# Patient Record
Sex: Male | Born: 1950
Health system: Southern US, Community
[De-identification: ages and names within clinical notes are randomized; demographics above are authoritative.]

## PROBLEM LIST (undated history)

## (undated) DIAGNOSIS — I10 Essential (primary) hypertension: Secondary | ICD-10-CM

## (undated) DIAGNOSIS — Z87891 Personal history of nicotine dependence: Secondary | ICD-10-CM

## (undated) DIAGNOSIS — I2510 Atherosclerotic heart disease of native coronary artery without angina pectoris: Secondary | ICD-10-CM

## (undated) DIAGNOSIS — E876 Hypokalemia: Secondary | ICD-10-CM

## (undated) DIAGNOSIS — E785 Hyperlipidemia, unspecified: Secondary | ICD-10-CM

## (undated) DIAGNOSIS — I5189 Other ill-defined heart diseases: Secondary | ICD-10-CM

## (undated) DIAGNOSIS — E119 Type 2 diabetes mellitus without complications: Secondary | ICD-10-CM

## (undated) DIAGNOSIS — I480 Paroxysmal atrial fibrillation: Secondary | ICD-10-CM

## (undated) DIAGNOSIS — I739 Peripheral vascular disease, unspecified: Secondary | ICD-10-CM

## (undated) HISTORY — PX: BACK SURGERY: SHX140

## (undated) HISTORY — DX: Personal history of nicotine dependence: Z87.891

## (undated) HISTORY — DX: Other ill-defined heart diseases: I51.89

## (undated) HISTORY — PX: KNEE SURGERY: SHX244

## (undated) HISTORY — DX: Type 2 diabetes mellitus without complications: E11.9

## (undated) HISTORY — DX: Atherosclerotic heart disease of native coronary artery without angina pectoris: I25.10

## (undated) HISTORY — DX: Hyperlipidemia, unspecified: E78.5

## (undated) HISTORY — DX: Hypokalemia: E87.6

## (undated) HISTORY — DX: Paroxysmal atrial fibrillation: I48.0

## (undated) HISTORY — PX: CARDIAC CATHETERIZATION: SHX172

## (undated) HISTORY — PX: COLONOSCOPY: SHX174

---

## 2014-04-15 ENCOUNTER — Ambulatory Visit: Payer: Self-pay | Admitting: Gastroenterology

## 2014-04-15 LAB — HM COLONOSCOPY

## 2015-05-17 LAB — PSA

## 2015-05-28 ENCOUNTER — Emergency Department
Admission: EM | Admit: 2015-05-28 | Discharge: 2015-05-28 | Disposition: A | Discharge status: Home / Self Care | Payer: No Typology Code available for payment source | Attending: Emergency Medicine | Admitting: Emergency Medicine

## 2015-05-28 ENCOUNTER — Emergency Department: Payer: No Typology Code available for payment source

## 2015-05-28 ENCOUNTER — Encounter: Payer: Self-pay | Admitting: Emergency Medicine

## 2015-05-28 DIAGNOSIS — J441 Chronic obstructive pulmonary disease with (acute) exacerbation: Secondary | ICD-10-CM | POA: Insufficient documentation

## 2015-05-28 DIAGNOSIS — J40 Bronchitis, not specified as acute or chronic: Secondary | ICD-10-CM

## 2015-05-28 DIAGNOSIS — I10 Essential (primary) hypertension: Secondary | ICD-10-CM | POA: Diagnosis not present

## 2015-05-28 DIAGNOSIS — J42 Unspecified chronic bronchitis: Secondary | ICD-10-CM

## 2015-05-28 DIAGNOSIS — Z720 Tobacco use: Secondary | ICD-10-CM | POA: Diagnosis not present

## 2015-05-28 DIAGNOSIS — Z79899 Other long term (current) drug therapy: Secondary | ICD-10-CM | POA: Diagnosis not present

## 2015-05-28 DIAGNOSIS — R05 Cough: Secondary | ICD-10-CM | POA: Diagnosis present

## 2015-05-28 HISTORY — DX: Essential (primary) hypertension: I10

## 2015-05-28 MED ORDER — DEXAMETHASONE SODIUM PHOSPHATE 10 MG/ML IJ SOLN
10.0000 mg | Freq: Once | INTRAMUSCULAR | Status: AC
Start: 2015-05-28 — End: 2015-05-28
  Administered 2015-05-28: 10 mg via INTRAMUSCULAR

## 2015-05-28 MED ORDER — LORATADINE 10 MG PO TABS: 10.0000 mg | ORAL_TABLET | Freq: Every day | ORAL | Status: DC

## 2015-05-28 MED ORDER — ALBUTEROL SULFATE HFA 108 (90 BASE) MCG/ACT IN AERS: 2.0000 | INHALATION_SPRAY | Freq: Four times a day (QID) | RESPIRATORY_TRACT | Status: DC | PRN

## 2015-05-28 MED ORDER — IPRATROPIUM-ALBUTEROL 0.5-2.5 (3) MG/3ML IN SOLN
3.0000 mL | Freq: Once | RESPIRATORY_TRACT | Status: AC
  Administered 2015-05-28: 3 mL via RESPIRATORY_TRACT

## 2015-05-28 MED ORDER — AZITHROMYCIN 250 MG PO TABS: ORAL_TABLET | ORAL | Status: AC

## 2015-05-28 MED ORDER — AZITHROMYCIN 250 MG PO TABS
500.0000 mg | ORAL_TABLET | Freq: Once | ORAL | Status: AC
  Administered 2015-05-28: 500 mg via ORAL

## 2015-05-28 MED ORDER — AZITHROMYCIN 250 MG PO TABS
ORAL_TABLET | ORAL | Status: AC
  Filled 2015-05-28: qty 2

## 2015-05-28 MED ORDER — IPRATROPIUM-ALBUTEROL 0.5-2.5 (3) MG/3ML IN SOLN
RESPIRATORY_TRACT | Status: AC
  Filled 2015-05-28: qty 3

## 2015-05-28 MED ORDER — DEXAMETHASONE SODIUM PHOSPHATE 10 MG/ML IJ SOLN
INTRAMUSCULAR | Status: AC
  Filled 2015-05-28: qty 1

## 2015-05-28 NOTE — ED Provider Notes (Signed)
CSN: [id number]     Arrival date & time 05/28/15  1518 History   First MD Initiated Contact with Patient 05/28/15 1618     Chief Complaint  Patient presents with  . Cough    cough, congestion and nose running since yesterday     (Consider location/radiation/quality/duration/timing/severity/associated sxs/prior Treatment) HPI Patient with worsening cough started approximately one week ago some drainage states that since yesterday he is congested he feels rattling in his chest wheezing and he is concerned he may be developing a pneumonia has a history of bronchitis he doesn't take anything for it regularly is a smoker approximately one pack per day denies any chest pain radiation swelling in his extremities has had subjective fever and chills nothing really seems to making anything particularly better or worse and no other complaints at this time  Past Medical History  Diagnosis Date  . Hypertension    No past surgical history on file. No family history on file. History  Substance Use Topics  . Smoking status: Current Every Day Smoker -- 1.00 packs/day    Types: Cigarettes  . Smokeless tobacco: Not on file  . Alcohol Use: No    Review of Systems  Constitutional: Negative.   HENT: Negative.   Eyes: Negative.   Cardiovascular: Negative.   Gastrointestinal: Negative.   Musculoskeletal: Negative.   Skin: Negative.   All other systems reviewed and are negative.      Allergies  Review of patient's allergies indicates no known allergies.  Home Medications   Prior to Admission medications   Medication Sig Start Date End Date Taking? Authorizing Provider  albuterol (PROVENTIL HFA;VENTOLIN HFA) 108 (90 BASE) MCG/ACT inhaler Inhale 2 puffs into the lungs every 6 (six) hours as needed for wheezing or shortness of breath. 05/28/15   Ajeenah Heiny Rosalyn Gess, PA-C  azithromycin (ZITHROMAX Z-PAK) 250 MG tablet 1 tablet (250 mg) once daily 05/29/15 06/02/15  Kalaya Infantino William C Dorrance Sellick, PA-C   loratadine (CLARITIN) 10 MG tablet Take 1 tablet (10 mg total) by mouth daily. 05/28/15 05/27/16  Chanise Habeck William C Jordan Caraveo, PA-C   BP 135/87 mmHg  Pulse 64  Temp(Src) 98.3 F (36.8 C) (Oral)  Resp 20  Ht  (1.905 m)  Wt 191 lb (86.637 kg)  BMI 23.87 kg/m2  SpO2 93% Physical Exam State male appearing stated age well-developed well-nourished no acute distress Vitals nursing note reviewed Head ears eyes nose neck and throat examination of this patient was grossly unremarkable pupils equal round reactive to light and accommodation extraocular motions are intact Cardiovascular regular rate and rhythm no murmurs rubs gallops Pulmonary patient has diminished breath sounds in all fields with wheezing in all fields Skin is free of rash or disease Musculoskeletal patient moving all extremities without difficulty patient has no lower extremity edema good distal pulses Neuro exam is nonfocal cranial nerves II through XII grossly intact  ED Course  Procedures (including critical care time) Labs Review Labs Reviewed - No data to display  Imaging Review Dg Chest 2 View  05/28/2015   CLINICAL DATA:  Cough, congestion and shortness of breath since last night.  EXAM: CHEST  2 VIEW  COMPARISON:  None.  FINDINGS: Lungs are hyperexpanded. There is mild interstitial thickening most evident in the lower lungs. Decreased markings in the upper lobes is consistent with emphysema. No lung consolidation or edema. No pleural effusion or pneumothorax.  Cardiac silhouette is normal in size. Normal mediastinal and hilar contours.  Bony thorax is intact.  IMPRESSION:  1. No acute cardiopulmonary disease. 2. COPD.   Electronically Signed   By: Amie Portlandavid  Ormond M.D.   On: 05/28/2015 16:33     EKG Interpretation None      MDM  Decision making on this patient given his age long-term smoking findings of COPD on x-ray believe this patient is having a rhonchi this COPD exacerbation with subjective fevers chills we'll go  ahead and start him on anabiotic's have him follow-up with his doctor to try to find definitive treatment for his COPD and start him on albuterol inhaler as well as azithromycin take over-the-counter cough medicine as needed return here for any acute concerns or worsening symptoms Final diagnoses:  Bronchitis  Chronic bronchitis, unspecified chronic bronchitis type        Karena Kinker Rosalyn GessWilliam C Arman Loy, PA-C 05/28/15 1736  Darien Ramusavid W Kaminski, MD 05/29/15 [phone]

## 2015-05-28 NOTE — Discharge Instructions (Signed)
How to Use an Inhaler Proper inhaler technique is very important. Good technique ensures that the medicine reaches the lungs. Poor technique results in depositing the medicine on the tongue and back of the throat rather than in the airways. If you do not use the inhaler with good technique, the medicine will not help you. STEPS TO FOLLOW IF USING AN INHALER WITHOUT AN EXTENSION TUBE  Remove the cap from the inhaler.  If you are using the inhaler for the first time, you will need to prime it. Shake the inhaler for 5 seconds and release four puffs into the air, away from your face. Ask your health care provider or pharmacist if you have questions about priming your inhaler.  Shake the inhaler for 5 seconds before each breath in (inhalation).  Position the inhaler so that the top of the canister faces up.  Put your index finger on the top of the medicine canister. Your thumb supports the bottom of the inhaler.  Open your mouth.  Either place the inhaler between your teeth and place your lips tightly around the mouthpiece, or hold the inhaler 1-2 inches away from your open mouth. If you are unsure of which technique to use, ask your health care provider.  Breathe out (exhale) normally and as completely as possible.  Press the canister down with your index finger to release the medicine.  At the same time as the canister is pressed, inhale deeply and slowly until your lungs are completely filled. This should take 4-6 seconds. Keep your tongue down.  Hold the medicine in your lungs for 5-10 seconds (10 seconds is best). This helps the medicine get into the small airways of your lungs.  Breathe out slowly, through pursed lips. Whistling is an example of pursed lips.  Wait at least 15-30 seconds between puffs. Continue with the above steps until you have taken the number of puffs your health care provider has ordered. Do not use the inhaler more than your health care provider tells  you.  Replace the cap on the inhaler.  Follow the directions from your health care provider or the inhaler insert for cleaning the inhaler. STEPS TO FOLLOW IF USING AN INHALER WITH AN EXTENSION (SPACER) 1. Remove the cap from the inhaler. 2. If you are using the inhaler for the first time, you will need to prime it. Shake the inhaler for 5 seconds and release four puffs into the air, away from your face. Ask your health care provider or pharmacist if you have questions about priming your inhaler. 3. Shake the inhaler for 5 seconds before each breath in (inhalation). 4. Place the open end of the spacer onto the mouthpiece of the inhaler. 5. Position the inhaler so that the top of the canister faces up and the spacer mouthpiece faces you. 6. Put your index finger on the top of the medicine canister. Your thumb supports the bottom of the inhaler and the spacer. 7. Breathe out (exhale) normally and as completely as possible. 8. Immediately after exhaling, place the spacer between your teeth and into your mouth. Close your lips tightly around the spacer. 9. Press the canister down with your index finger to release the medicine. 10. At the same time as the canister is pressed, inhale deeply and slowly until your lungs are completely filled. This should take 4-6 seconds. Keep your tongue down and out of the way. 11. Hold the medicine in your lungs for 5-10 seconds (10 seconds is best). This helps the  medicine get into the small airways of your lungs. Exhale. 12. Repeat inhaling deeply through the spacer mouthpiece. Again hold that breath for up to 10 seconds (10 seconds is best). Exhale slowly. If it is difficult to take this second deep breath through the spacer, breathe normally several times through the spacer. Remove the spacer from your mouth. 13. Wait at least 15-30 seconds between puffs. Continue with the above steps until you have taken the number of puffs your health care provider has ordered. Do  not use the inhaler more than your health care provider tells you. 14. Remove the spacer from the inhaler, and place the cap on the inhaler. 15. Follow the directions from your health care provider or the inhaler insert for cleaning the inhaler and spacer. If you are using different kinds of inhalers, use your quick relief medicine to open the airways 10-15 minutes before using a steroid if instructed to do so by your health care provider. If you are unsure which inhalers to use and the order of using them, ask your health care provider, nurse, or respiratory therapist. If you are using a steroid inhaler, always rinse your mouth with water after your last puff, then gargle and spit out the water. Do not swallow the water. AVOID:  Inhaling before or after starting the spray of medicine. It takes practice to coordinate your breathing with triggering the spray.  Inhaling through the nose (rather than the mouth) when triggering the spray. HOW TO DETERMINE IF YOUR INHALER IS FULL OR NEARLY EMPTY You cannot know when an inhaler is empty by shaking it. A few inhalers are now being made with dose counters. Ask your health care provider for a prescription that has a dose counter if you feel you need that extra help. If your inhaler does not have a counter, ask your health care provider to help you determine the date you need to refill your inhaler. Write the refill date on a calendar or your inhaler canister. Refill your inhaler 7-10 days before it runs out. Be sure to keep an adequate supply of medicine. This includes making sure it is not expired, and that you have a spare inhaler.  SEEK MEDICAL CARE IF:   Your symptoms are only partially relieved with your inhaler.  You are having trouble using your inhaler.  You have some increase in phlegm. SEEK IMMEDIATE MEDICAL CARE IF:   You feel little or no relief with your inhalers. You are still wheezing and are feeling shortness of breath or tightness in  your chest or both.  You have dizziness, headaches, or a fast heart rate.  You have chills, fever, or night sweats.  You have a noticeable increase in phlegm production, or there is blood in the phlegm. MAKE SURE YOU:   Understand these instructions.  Will watch your condition.  Will get help right away if you are not doing well or get worse. Document Released: 12/14/2000 Document Revised: 10/07/2013 Document Reviewed: 07/16/2013 [facility] Patient Information 2015 [place]. This information is not intended to replace advice given to you by your health care provider. Make sure you discuss any questions you have with your health care provider.  Chronic Obstructive Pulmonary Disease Chronic obstructive pulmonary disease (COPD) is a common lung problem. In COPD, the flow of air from the lungs is limited. The way your lungs work will probably never return to normal, but there are things you can do to improve your lungs and make yourself feel better. HOME  CARE  Take all medicines as told by your doctor.  Avoid medicines or cough syrups that dry up your airway (such as antihistamines) and do not allow you to get rid of thick spit. You do not need to avoid them if told differently by your doctor.  If you smoke, stop. Smoking makes the problem worse.  Avoid being around things that make your breathing worse (like smoke, chemicals, and fumes).  Use oxygen therapy and therapy to help improve your lungs (pulmonary rehabilitation) if told by your doctor. If you need home oxygen therapy, ask your doctor if you should buy a tool to measure your oxygen level (oximeter).  Avoid people who have a sickness you can catch (contagious).  Avoid going outside when it is very hot, cold, or humid.  Eat healthy foods. Eat smaller meals more often. Rest before meals.  Stay active, but remember to also rest.  Make sure to get all the shots (vaccines) your doctor recommends. Ask your doctor if you  need a pneumonia shot.  Learn and use tips on how to relax.  Learn and use tips on how to control your breathing as told by your doctor. Try:  Breathing in (inhaling) through your nose for 1 second. Then, pucker your lips and breath out (exhale) through your lips for 2 seconds.  Putting one hand on your belly (abdomen). Breathe in slowly through your nose for 1 second. Your hand on your belly should move out. Pucker your lips and breathe out slowly through your lips. Your hand on your belly should move in as you breathe out.  Learn and use controlled coughing to clear thick spit from your lungs. The steps are: 16. Lean your head a little forward. 17. Breathe in deeply. 18. Try to hold your breath for 3 seconds. 19. Keep your mouth slightly open while coughing 2 times. 20. Spit any thick spit out into a tissue. 21. Rest and do the steps again 1 or 2 times as needed. GET HELP IF:  You cough up more thick spit than usual.  There is a change in the color or thickness of the spit.  It is harder to breathe than usual.  Your breathing is faster than usual. GET HELP RIGHT AWAY IF:   You have shortness of breath while resting.  You have shortness of breath that stops you from:  Being able to talk.  Doing normal activities.  You chest hurts for longer than 5 minutes.  Your skin color is more blue than usual.  Your pulse oximeter shows that you have low oxygen for longer than 5 minutes. MAKE SURE YOU:   Understand these instructions.  Will watch your condition.  Will get help right away if you are not doing well or get worse. Document Released: 06/04/2008 Document Revised: 05/03/2014 Document Reviewed: 08/13/2013 [facility] Patient Information 2015 [place], [place]. This information is not intended to replace advice given to you by your health care provider. Make sure you discuss any questions you have with your health care provider.

## 2015-11-23 DIAGNOSIS — I119 Hypertensive heart disease without heart failure: Secondary | ICD-10-CM | POA: Insufficient documentation

## 2015-11-23 DIAGNOSIS — E785 Hyperlipidemia, unspecified: Secondary | ICD-10-CM

## 2015-11-23 DIAGNOSIS — E1169 Type 2 diabetes mellitus with other specified complication: Secondary | ICD-10-CM | POA: Insufficient documentation

## 2015-11-23 DIAGNOSIS — F17200 Nicotine dependence, unspecified, uncomplicated: Secondary | ICD-10-CM | POA: Insufficient documentation

## 2015-11-23 DIAGNOSIS — I10 Essential (primary) hypertension: Secondary | ICD-10-CM | POA: Insufficient documentation

## 2015-11-23 DIAGNOSIS — N529 Male erectile dysfunction, unspecified: Secondary | ICD-10-CM

## 2015-11-23 DIAGNOSIS — E119 Type 2 diabetes mellitus without complications: Secondary | ICD-10-CM

## 2015-11-23 DIAGNOSIS — E1140 Type 2 diabetes mellitus with diabetic neuropathy, unspecified: Secondary | ICD-10-CM | POA: Insufficient documentation

## 2015-11-23 DIAGNOSIS — I739 Peripheral vascular disease, unspecified: Secondary | ICD-10-CM | POA: Insufficient documentation

## 2015-11-23 DIAGNOSIS — I152 Hypertension secondary to endocrine disorders: Secondary | ICD-10-CM | POA: Insufficient documentation

## 2015-11-23 DIAGNOSIS — E1159 Type 2 diabetes mellitus with other circulatory complications: Secondary | ICD-10-CM | POA: Insufficient documentation

## 2015-11-23 DIAGNOSIS — E1165 Type 2 diabetes mellitus with hyperglycemia: Secondary | ICD-10-CM | POA: Insufficient documentation

## 2015-11-23 DIAGNOSIS — Z87891 Personal history of nicotine dependence: Secondary | ICD-10-CM | POA: Insufficient documentation

## 2015-11-23 DIAGNOSIS — K5900 Constipation, unspecified: Secondary | ICD-10-CM

## 2015-11-30 ENCOUNTER — Ambulatory Visit (INDEPENDENT_AMBULATORY_CARE_PROVIDER_SITE_OTHER): Payer: No Typology Code available for payment source | Admitting: Unknown Physician Specialty

## 2015-11-30 ENCOUNTER — Encounter: Payer: Self-pay | Admitting: Unknown Physician Specialty

## 2015-11-30 VITALS — BP 155/83 | HR 56 | Temp 98.5°F | Ht 75.1 in | Wt 195.0 lb

## 2015-11-30 DIAGNOSIS — Z0000 Encounter for general adult medical examination without abnormal findings: Secondary | ICD-10-CM

## 2015-11-30 DIAGNOSIS — E119 Type 2 diabetes mellitus without complications: Secondary | ICD-10-CM | POA: Diagnosis not present

## 2015-11-30 DIAGNOSIS — Z23 Encounter for immunization: Secondary | ICD-10-CM | POA: Diagnosis not present

## 2015-11-30 DIAGNOSIS — I10 Essential (primary) hypertension: Secondary | ICD-10-CM

## 2015-11-30 LAB — BAYER DCA HB A1C WAIVED: HB A1C (BAYER DCA - WAIVED): 7.1 % — ABNORMAL HIGH (ref <7.0)

## 2015-11-30 MED ORDER — LISINOPRIL 40 MG PO TABS: 40.0000 mg | ORAL_TABLET | Freq: Every day | ORAL | Status: DC

## 2015-11-30 MED ORDER — ATENOLOL 50 MG PO TABS: 50.0000 mg | ORAL_TABLET | Freq: Every day | ORAL | Status: DC

## 2015-11-30 MED ORDER — METFORMIN HCL 500 MG PO TABS: 500.0000 mg | ORAL_TABLET | Freq: Two times a day (BID) | ORAL | Status: DC

## 2015-11-30 MED ORDER — AMLODIPINE BESYLATE 10 MG PO TABS: 10.0000 mg | ORAL_TABLET | Freq: Every day | ORAL | Status: DC

## 2015-11-30 MED ORDER — ATORVASTATIN CALCIUM 40 MG PO TABS: 40.0000 mg | ORAL_TABLET | Freq: Every day | ORAL | Status: DC

## 2015-11-30 MED ORDER — ALBUTEROL SULFATE HFA 108 (90 BASE) MCG/ACT IN AERS: 2.0000 | INHALATION_SPRAY | Freq: Four times a day (QID) | RESPIRATORY_TRACT | Status: DC | PRN

## 2015-11-30 NOTE — Patient Instructions (Signed)
Start Metformin 500 mg daily for 2 weeks and then increase to twice a day

## 2015-11-30 NOTE — Assessment & Plan Note (Signed)
Hgb A1C is 7.1.  Start Metformin BID

## 2015-11-30 NOTE — Progress Notes (Signed)
BP 155/83 mmHg  Pulse 56  Temp(Src) 98.5 F (36.9 C)  Ht 6' 3.1" (1.908 m)  Wt 195 lb (88.451 kg)  BMI 24.30 kg/m2  SpO2 92%   Subjective:    Patient ID: Brendan [name], male    DOB: [birth year], 64 y.o.   MRN: [id number]  HPI: Brendan PrimusLarry Taubman is a 64 y.o. male  Chief Complaint  Patient presents with  . Hypertension  . Hyperlipidemia   Hypertension Using medications without difficulty Average home BPs not checking   No problems or lightheadedness No chest pain with exertion or shortness of breath No Edema   Hyperlipidemia Using medications without problems: No Muscle aches  Diet compliance: good Exercise: active at work  Diabetes Diet controlled.  Sugars running in the 80's   Relevant past medical, surgical, family and social history reviewed and updated as indicated. Interim medical history since our last visit reviewed. Allergies and medications reviewed and updated.  Review of Systems  Respiratory:       Uising inhaler on occasion.  Still smokes    Per HPI unless specifically indicated above     Objective:    BP 155/83 mmHg  Pulse 56  Temp(Src) 98.5 F (36.9 C)  Ht 6' 3.1" (1.908 m)  Wt 195 lb (88.451 kg)  BMI 24.30 kg/m2  SpO2 92%  Wt Readings from Last 3 Encounters:  11/30/15 195 lb (88.451 kg)  05/31/15 191 lb (86.637 kg)  05/28/15 191 lb (86.637 kg)    Physical Exam  Constitutional: He is oriented to person, place, and time. He appears well-developed and well-nourished. No distress.  HENT:  Head: Normocephalic and atraumatic.  Eyes: Conjunctivae and lids are normal. Right eye exhibits no discharge. Left eye exhibits no discharge. No scleral icterus.  Neck: Normal range of motion. Neck supple. No JVD present. Carotid bruit is not present.  Cardiovascular: Normal rate, regular rhythm and normal heart sounds.   Pulmonary/Chest: Effort normal and breath sounds normal. No respiratory distress.  Abdominal: Normal appearance. There is no splenomegaly  or hepatomegaly.  Musculoskeletal: Normal range of motion.  Neurological: He is alert and oriented to person, place, and time.  Skin: Skin is warm, dry and intact. No rash noted. No pallor.  Psychiatric: He has a normal mood and affect. His behavior is normal. Judgment and thought content normal.   Diabetic Foot Exam - Simple   Simple Foot Form  Diabetic Foot exam was performed with the following findings:  Yes 11/30/2015  2:50 PM  Visual Inspection  No deformities, no ulcerations, no other skin breakdown bilaterally:  Yes  Sensation Testing  Intact to touch and monofilament testing bilaterally:  Yes  Pulse Check  Posterior Tibialis and Dorsalis pulse intact bilaterally:  Yes  Comments       Results for orders placed or performed in visit on 11/23/15  PSA  Result Value Ref Range   PSA from PP   HM COLONOSCOPY  Result Value Ref Range   HM Colonoscopy from PP       Assessment & Plan:   Problem List Items Addressed This Visit      Unprioritized   Hypertension    Stable, continue present medications.       Relevant Medications   amLODipine (NORVASC) 10 MG tablet   atenolol (TENORMIN) 50 MG tablet   atorvastatin (LIPITOR) 40 MG tablet   lisinopril (PRINIVIL,ZESTRIL) 40 MG tablet   Other Relevant Orders   Comprehensive metabolic panel   Diabetes (HCC)  Hgb A1C is 7.1.  Start Metformin BID      Relevant Medications   atorvastatin (LIPITOR) 40 MG tablet   lisinopril (PRINIVIL,ZESTRIL) 40 MG tablet   metFORMIN (GLUCOPHAGE) 500 MG tablet   Other Relevant Orders   Bayer DCA Hb A1c Waived    Other Visit Diagnoses    Immunization due    -  Primary    Relevant Orders    Flu Vaccine QUAD 36+ mos IM (Completed)    Routine health maintenance        Relevant Orders    HIV antibody    Hepatitis C antibody        Follow up plan: Return in about 3 months (around 02/28/2016).

## 2015-11-30 NOTE — Assessment & Plan Note (Signed)
Stable, continue present medications.   

## 2015-12-01 LAB — COMPREHENSIVE METABOLIC PANEL
ALK PHOS: 84 IU/L (ref 39–117)
ALT: 23 IU/L (ref 0–44)
AST: 13 IU/L (ref 0–40)
Albumin/Globulin Ratio: 2.4 (ref 1.1–2.5)
Albumin: 4.6 g/dL (ref 3.6–4.8)
BUN/Creatinine Ratio: 18 (ref 10–22)
BUN: 15 mg/dL (ref 8–27)
Bilirubin Total: 0.3 mg/dL (ref 0.0–1.2)
CALCIUM: 9.5 mg/dL (ref 8.6–10.2)
CO2: 26 mmol/L (ref 18–29)
Chloride: 103 mmol/L (ref 97–106)
Creatinine, Ser: 0.83 mg/dL (ref 0.76–1.27)
GFR calc Af Amer: 107 mL/min/{1.73_m2} (ref >59)
GFR calc non Af Amer: 93 mL/min/{1.73_m2} (ref >59)
GLOBULIN, TOTAL: 1.9 g/dL (ref 1.5–4.5)
GLUCOSE: 111 mg/dL — AB (ref 65–99)
Potassium: 4.1 mmol/L (ref 3.5–5.2)
Sodium: 144 mmol/L (ref 136–144)
Total Protein: 6.5 g/dL (ref 6.0–8.5)

## 2015-12-01 LAB — HEPATITIS C ANTIBODY: Hep C Virus Ab: 0.1 s/co ratio (ref 0.0–0.9)

## 2015-12-01 LAB — HIV ANTIBODY (ROUTINE TESTING W REFLEX): HIV SCREEN 4TH GENERATION: NONREACTIVE

## 2016-02-29 ENCOUNTER — Encounter: Payer: Self-pay | Admitting: Unknown Physician Specialty

## 2016-02-29 ENCOUNTER — Ambulatory Visit (INDEPENDENT_AMBULATORY_CARE_PROVIDER_SITE_OTHER): Payer: BLUE CROSS/BLUE SHIELD | Admitting: Unknown Physician Specialty

## 2016-02-29 VITALS — BP 152/86 | HR 52 | Temp 97.7°F | Ht 73.2 in | Wt 184.6 lb

## 2016-02-29 DIAGNOSIS — E119 Type 2 diabetes mellitus without complications: Secondary | ICD-10-CM

## 2016-02-29 DIAGNOSIS — F17200 Nicotine dependence, unspecified, uncomplicated: Secondary | ICD-10-CM

## 2016-02-29 DIAGNOSIS — I10 Essential (primary) hypertension: Secondary | ICD-10-CM

## 2016-02-29 DIAGNOSIS — R351 Nocturia: Secondary | ICD-10-CM

## 2016-02-29 DIAGNOSIS — R350 Frequency of micturition: Secondary | ICD-10-CM | POA: Diagnosis not present

## 2016-02-29 DIAGNOSIS — N400 Enlarged prostate without lower urinary tract symptoms: Secondary | ICD-10-CM | POA: Insufficient documentation

## 2016-02-29 DIAGNOSIS — E785 Hyperlipidemia, unspecified: Secondary | ICD-10-CM | POA: Diagnosis not present

## 2016-02-29 LAB — LIPID PANEL PICCOLO, WAIVED
CHOL/HDL RATIO PICCOLO,WAIVE: 2.8 mg/dL
Cholesterol Piccolo, Waived: 111 mg/dL (ref <200)
HDL CHOL PICCOLO, WAIVED: 39 mg/dL — AB (ref >59)
LDL CHOL CALC PICCOLO WAIVED: 57 mg/dL (ref <100)
Triglycerides Piccolo,Waived: 73 mg/dL (ref <150)
VLDL CHOL CALC PICCOLO,WAIVE: 15 mg/dL (ref <30)

## 2016-02-29 LAB — BAYER DCA HB A1C WAIVED: HB A1C (BAYER DCA - WAIVED): 6.2 % (ref <7.0)

## 2016-02-29 MED ORDER — TAMSULOSIN HCL 0.4 MG PO CAPS: 0.4000 mg | ORAL_CAPSULE | Freq: Every day | ORAL | Status: DC

## 2016-02-29 NOTE — Progress Notes (Signed)
BP 152/86 mmHg  Pulse 52  Temp(Src) 97.7 F (36.5 C)  Ht 6' 1.2" (1.859 m)  Wt 184 lb 9.6 oz (83.734 kg)  BMI 24.23 kg/m2  SpO2 92%   Subjective:    Patient ID: Brendan [name], male    DOB: [birth year], 65 y.o.   MRN: [id number]  HPI: Friend Dorfman is a 65 y.o. male  Chief Complaint  Patient presents with  . Diabetes  . Hyperlipidemia  . Hypertension    pt states he has been a little stressed lately because he is getting ready to switch to Medicare  . Urinary Frequency    pt states he has been having to get up 2 or 3 times a night to go to the bathroom    Diabetes:  Using medications without difficulties No hypoglycemic episodes: No hyperglycemic episodes: Feet problems:none Blood Sugars averaging:80's-90's eye exam within last year  Hypertension:  Using medications without difficulty Average home BPs: not checking   Using medication without problems or lightheadedness No chest pain with exertion or shortness of breath No Edema  Elevated Cholesterol: Using medications without problems: No Muscle aches Diet compliance: good Exercise: regular   Relevant past medical, surgical, family and social history reviewed and updated as indicated. Interim medical history since our last visit reviewed. Allergies and medications reviewed and updated.  Review of Systems  Constitutional: Negative.   HENT: Negative.   Eyes: Negative.   Respiratory: Negative.   Cardiovascular: Negative.   Gastrointestinal: Negative.   Endocrine: Negative.   Genitourinary: Positive for frequency.  Skin: Negative.   Allergic/Immunologic: Negative.   Neurological: Negative.   Hematological: Negative.   Psychiatric/Behavioral: Negative.     Per HPI unless specifically indicated above     Objective:    BP 152/86 mmHg  Pulse 52  Temp(Src) 97.7 F (36.5 C)  Ht 6' 1.2" (1.859 m)  Wt 184 lb 9.6 oz (83.734 kg)  BMI 24.23 kg/m2  SpO2 92%  Wt Readings from Last 3 Encounters:  02/29/16 184  lb 9.6 oz (83.734 kg)  11/30/15 195 lb (88.451 kg)  05/31/15 191 lb (86.637 kg)    Physical Exam  Constitutional: He is oriented to person, place, and time. He appears well-developed and well-nourished. No distress.  HENT:  Head: Normocephalic and atraumatic.  Eyes: Conjunctivae and lids are normal. Right eye exhibits no discharge. Left eye exhibits no discharge. No scleral icterus.  Neck: Normal range of motion. Neck supple. No JVD present. Carotid bruit is not present.  Cardiovascular: Normal rate, regular rhythm and normal heart sounds.   Pulmonary/Chest: Effort normal and breath sounds normal. No respiratory distress.  Abdominal: Normal appearance. There is no splenomegaly or hepatomegaly.  Musculoskeletal: Normal range of motion.  Neurological: He is alert and oriented to person, place, and time.  Skin: Skin is warm, dry and intact. No rash noted. No pallor.  Psychiatric: He has a normal mood and affect. His behavior is normal. Judgment and thought content normal.    Results for orders placed or performed in visit on 11/30/15  Bayer DCA Hb A1c Waived  Result Value Ref Range   Bayer DCA Hb A1c Waived 7.1 (H) <7.0 %  HIV antibody  Result Value Ref Range   HIV Screen 4th Generation wRfx Non Reactive Non Reactive  Hepatitis C antibody  Result Value Ref Range   Hep C Virus Ab 0.1 0.0 - 0.9 s/co ratio  Comprehensive metabolic panel  Result Value Ref Range   Glucose 111 (H) 65 -  99 mg/dL   BUN 15 8 - 27 mg/dL   Creatinine, Ser 4.09 0.76 - 1.27 mg/dL   GFR calc non Af Amer 93 >59 mL/min/1.73   GFR calc Af Amer 107 >59 mL/min/1.73   BUN/Creatinine Ratio 18 10 - 22   Sodium 144 136 - 144 mmol/L   Potassium 4.1 3.5 - 5.2 mmol/L   Chloride 103 97 - 106 mmol/L   CO2 26 18 - 29 mmol/L   Calcium 9.5 8.6 - 10.2 mg/dL   Total Protein 6.5 6.0 - 8.5 g/dL   Albumin 4.6 3.6 - 4.8 g/dL   Globulin, Total 1.9 1.5 - 4.5 g/dL   Albumin/Globulin Ratio 2.4 1.1 - 2.5   Bilirubin Total 0.3 0.0 -  1.2 mg/dL   Alkaline Phosphatase 84 39 - 117 IU/L   AST 13 0 - 40 IU/L   ALT 23 0 - 44 IU/L      Assessment & Plan:   Problem List Items Addressed This Visit      Unprioritized   Hypertension    Not to goal.  Recheck in 3 months       Relevant Orders   Comprehensive metabolic panel   Uric acid   Diabetes mellitus type 2, controlled, without complications (HCC) - Primary    Hgb A1C 6.2.  Continue present medication      Relevant Orders   Bayer DCA Hb A1c Waived   Hyperlipidemia    At goal.  Continue present meds      Relevant Orders   Lipid Panel Piccolo, Waived   Tobacco use disorder    Set up for low dose CT      Nocturia    Rx for Flomax.  Take 1/day      Relevant Orders   PSA    Other Visit Diagnoses    Urinary frequency        Relevant Orders    PSA        Follow up plan: Return in about 6 months (around 08/31/2016) for physical.

## 2016-02-29 NOTE — Assessment & Plan Note (Signed)
Rx for Flomax.  Take 1/day

## 2016-02-29 NOTE — Assessment & Plan Note (Addendum)
Not to goal.  Recheck in 3 months

## 2016-02-29 NOTE — Assessment & Plan Note (Signed)
Hgb A1C 6.2.  Continue present medication

## 2016-02-29 NOTE — Assessment & Plan Note (Signed)
At goal.  Continue present meds 

## 2016-02-29 NOTE — Assessment & Plan Note (Signed)
Set up for low dose CT 

## 2016-03-01 LAB — COMPREHENSIVE METABOLIC PANEL
A/G RATIO: 2.4 (ref 1.1–2.5)
ALK PHOS: 76 IU/L (ref 39–117)
ALT: 16 IU/L (ref 0–44)
AST: 10 IU/L (ref 0–40)
Albumin: 4.7 g/dL (ref 3.6–4.8)
BILIRUBIN TOTAL: 0.4 mg/dL (ref 0.0–1.2)
BUN/Creatinine Ratio: 14 (ref 10–22)
BUN: 12 mg/dL (ref 8–27)
CALCIUM: 9.4 mg/dL (ref 8.6–10.2)
CO2: 23 mmol/L (ref 18–29)
Chloride: 103 mmol/L (ref 96–106)
Creatinine, Ser: 0.85 mg/dL (ref 0.76–1.27)
GFR calc Af Amer: 106 mL/min/{1.73_m2} (ref >59)
GFR, EST NON AFRICAN AMERICAN: 92 mL/min/{1.73_m2} (ref >59)
GLOBULIN, TOTAL: 2 g/dL (ref 1.5–4.5)
Glucose: 92 mg/dL (ref 65–99)
POTASSIUM: 3.7 mmol/L (ref 3.5–5.2)
SODIUM: 144 mmol/L (ref 134–144)
Total Protein: 6.7 g/dL (ref 6.0–8.5)

## 2016-03-01 LAB — URIC ACID: Uric Acid: 5.5 mg/dL (ref 3.7–8.6)

## 2016-03-01 LAB — PSA: Prostate Specific Ag, Serum: 0.3 ng/mL (ref 0.0–4.0)

## 2016-03-02 ENCOUNTER — Encounter: Payer: Self-pay | Admitting: Unknown Physician Specialty

## 2016-03-26 ENCOUNTER — Encounter: Payer: Self-pay | Admitting: Unknown Physician Specialty

## 2016-03-26 ENCOUNTER — Ambulatory Visit (INDEPENDENT_AMBULATORY_CARE_PROVIDER_SITE_OTHER): Payer: BLUE CROSS/BLUE SHIELD | Admitting: Unknown Physician Specialty

## 2016-03-26 VITALS — BP 122/74 | HR 62 | Temp 98.2°F | Ht 73.5 in | Wt 187.4 lb

## 2016-03-26 DIAGNOSIS — J310 Chronic rhinitis: Secondary | ICD-10-CM | POA: Diagnosis not present

## 2016-03-26 DIAGNOSIS — J309 Allergic rhinitis, unspecified: Secondary | ICD-10-CM | POA: Insufficient documentation

## 2016-03-26 DIAGNOSIS — T485X1A Poisoning by other anti-common-cold drugs, accidental (unintentional), initial encounter: Secondary | ICD-10-CM | POA: Diagnosis not present

## 2016-03-26 DIAGNOSIS — T485X5A Adverse effect of other anti-common-cold drugs, initial encounter: Secondary | ICD-10-CM

## 2016-03-26 MED ORDER — FLUTICASONE PROPIONATE 50 MCG/ACT NA SUSP: 2.0000 | Freq: Every day | NASAL | Status: DC

## 2016-03-26 MED ORDER — PREDNISONE 20 MG PO TABS: 20.0000 mg | ORAL_TABLET | Freq: Every day | ORAL | Status: DC

## 2016-03-26 NOTE — Progress Notes (Signed)
BP 122/74 mmHg  Pulse 62  Temp(Src) 98.2 F (36.8 C)  Ht 6' 1.5" (1.867 m)  Wt 187 lb 6.4 oz (85.004 kg)  BMI 24.39 kg/m2  SpO2 93%   Subjective:    Patient ID: Brendan PrimusLarry Dang, male    DOB: 1951/03/28, 65 y.o.   MRN: [id number]  HPI: Brendan [name] is a 65 y.o. male  Chief Complaint  Patient presents with  . URI    pt states he has had nasal congestion and he cannot sleep because of it. states his nose has been stopped up for about a month   Congestion: Having a lot of nasal congestion for about a month.  Using a nasal spray from the drug store that he is using off and on.  States he is is using it before he goes to bed.  No cough.  Breathing is good.     Relevant past medical, surgical, family and social history reviewed and updated as indicated. Interim medical history since our last visit reviewed. Allergies and medications reviewed and updated.  Review of Systems  Per HPI unless specifically indicated above     Objective:    BP 122/74 mmHg  Pulse 62  Temp(Src) 98.2 F (36.8 C)  Ht 6' 1.5" (1.867 m)  Wt 187 lb 6.4 oz (85.004 kg)  BMI 24.39 kg/m2  SpO2 93%  Wt Readings from Last 3 Encounters:  03/26/16 187 lb 6.4 oz (85.004 kg)  02/29/16 184 lb 9.6 oz (83.734 kg)  11/30/15 195 lb (88.451 kg)    Physical Exam  Constitutional: He is oriented to person, place, and time. He appears well-developed and well-nourished. No distress.  HENT:  Head: Normocephalic and atraumatic.  Nose: Mucosal edema and rhinorrhea present. Right sinus exhibits no maxillary sinus tenderness and no frontal sinus tenderness. Left sinus exhibits no frontal sinus tenderness.  Eyes: Conjunctivae and lids are normal. Right eye exhibits no discharge. Left eye exhibits no discharge. No scleral icterus.  Cardiovascular: Normal rate.   Pulmonary/Chest: Effort normal.  Abdominal: Normal appearance. There is no splenomegaly or hepatomegaly.  Musculoskeletal: Normal range of motion.  Neurological: He is  alert and oriented to person, place, and time.  Skin: Skin is intact. No rash noted. No pallor.  Psychiatric: He has a normal mood and affect. His behavior is normal. Judgment and thought content normal.    Results for orders placed or performed in visit on 02/29/16  Comprehensive metabolic panel  Result Value Ref Range   Glucose 92 65 - 99 mg/dL   BUN 12 8 - 27 mg/dL   Creatinine, Ser 4.090.85 0.76 - 1.27 mg/dL   GFR calc non Af Amer 92 >59 mL/min/1.73   GFR calc Af Amer 106 >59 mL/min/1.73   BUN/Creatinine Ratio 14 10 - 22   Sodium 144 134 - 144 mmol/L   Potassium 3.7 3.5 - 5.2 mmol/L   Chloride 103 96 - 106 mmol/L   CO2 23 18 - 29 mmol/L   Calcium 9.4 8.6 - 10.2 mg/dL   Total Protein 6.7 6.0 - 8.5 g/dL   Albumin 4.7 3.6 - 4.8 g/dL   Globulin, Total 2.0 1.5 - 4.5 g/dL   Albumin/Globulin Ratio 2.4 1.1 - 2.5   Bilirubin Total 0.4 0.0 - 1.2 mg/dL   Alkaline Phosphatase 76 39 - 117 IU/L   AST 10 0 - 40 IU/L   ALT 16 0 - 44 IU/L  Bayer DCA Hb A1c Waived  Result Value Ref Range   Hovnanian EnterprisesBayer  DCA Hb A1c Waived 6.2 <7.0 %  Lipid Panel Piccolo, Waived  Result Value Ref Range   Cholesterol Piccolo, Waived 111 <200 mg/dL   HDL Chol Piccolo, Waived 39 (L) >59 mg/dL   Triglycerides Piccolo,Waived 73 <150 mg/dL   Chol/HDL Ratio Piccolo,Waive 2.8 mg/dL   LDL Chol Calc Piccolo Waived 57 <100 mg/dL   VLDL Chol Calc Piccolo,Waive 15 <30 mg/dL  Uric acid  Result Value Ref Range   Uric Acid 5.5 3.7 - 8.6 mg/dL  PSA  Result Value Ref Range   Prostate Specific Ag, Serum 0.3 0.0 - 4.0 ng/mL      Assessment & Plan:   Problem List Items Addressed This Visit      Unprioritized   Allergic rhinitis - Primary    Other Visit Diagnoses    Rhinitis medicamentosa           Stop OTC nasal spray.  Start Flonase.  Rs for steroid pack.  OTC allergy med Follow up plan: Return if symptoms worsen or fail to improve.

## 2016-03-26 NOTE — Patient Instructions (Signed)
In addition to nasal spray.  Take an oral anti-histamine.  Chose Loratadine or Fexofenodine.

## 2016-03-27 ENCOUNTER — Telehealth: Payer: Self-pay | Admitting: *Deleted

## 2016-03-27 NOTE — Telephone Encounter (Signed)
Received referral for low dose lung cancer screening CT scan . Unable to leave voicemail at phone number listed in EMR. Will follow up at later date.

## 2016-04-13 ENCOUNTER — Telehealth: Payer: Self-pay | Admitting: *Deleted

## 2016-04-13 NOTE — Telephone Encounter (Signed)
Received referral for low dose lung cancer screening CT scan from pcp . Unable to leave voicemail at # listed.

## 2016-04-23 ENCOUNTER — Telehealth: Payer: Self-pay | Admitting: *Deleted

## 2016-04-23 NOTE — Telephone Encounter (Signed)
Received referral for low dose lung cancer screening CT scan. Attempted to leave voicemail at phone number listed in EMR for patient to call me back to facilitate scheduling scan. However, I have been unable to leave voicemail. Brother, listed at contact, notified of inability to contact patient and he states he will have patient call me.

## 2016-05-03 ENCOUNTER — Encounter: Payer: Self-pay | Admitting: Family Medicine

## 2016-05-03 ENCOUNTER — Other Ambulatory Visit: Payer: Self-pay | Admitting: Family Medicine

## 2016-05-03 DIAGNOSIS — Z87891 Personal history of nicotine dependence: Secondary | ICD-10-CM

## 2016-05-03 HISTORY — DX: Personal history of nicotine dependence: Z87.891

## 2016-05-04 ENCOUNTER — Encounter: Payer: Self-pay | Admitting: Family Medicine

## 2016-05-04 ENCOUNTER — Ambulatory Visit
Admission: RE | Admit: 2016-05-04 | Discharge: 2016-05-04 | Disposition: A | Discharge status: Home / Self Care | Payer: Commercial Managed Care - HMO | Source: Ambulatory Visit | Attending: Family Medicine | Admitting: Family Medicine

## 2016-05-04 ENCOUNTER — Inpatient Hospital Stay: Payer: Commercial Managed Care - HMO | Attending: Family Medicine | Admitting: Family Medicine

## 2016-05-04 DIAGNOSIS — Z122 Encounter for screening for malignant neoplasm of respiratory organs: Secondary | ICD-10-CM | POA: Diagnosis not present

## 2016-05-04 DIAGNOSIS — Z87891 Personal history of nicotine dependence: Secondary | ICD-10-CM | POA: Insufficient documentation

## 2016-05-04 DIAGNOSIS — I700 Atherosclerosis of aorta: Secondary | ICD-10-CM | POA: Insufficient documentation

## 2016-05-04 DIAGNOSIS — J439 Emphysema, unspecified: Secondary | ICD-10-CM | POA: Diagnosis not present

## 2016-05-04 DIAGNOSIS — I2510 Atherosclerotic heart disease of native coronary artery without angina pectoris: Secondary | ICD-10-CM | POA: Diagnosis not present

## 2016-05-04 NOTE — Progress Notes (Signed)
In accordance with CMS guidelines, patient has meet eligibility criteria including age, absence of signs or symptoms of lung cancer, the specific calculation of cigarette smoking pack-years was 40 years and is a current smoker.   A shared decision-making session was conducted prior to the performance of CT scan. This includes one or more decision aids, includes benefits and harms of screening, follow-up diagnostic testing, over-diagnosis, false positive rate, and total radiation exposure.  Counseling on the importance of adherence to annual lung cancer LDCT screening, impact of co-morbidities, and ability or willingness to undergo diagnosis and treatment is imperative for compliance of the program.  Counseling on the importance of continued smoking cessation for former smokers; the importance of smoking cessation for current smokers and information about tobacco cessation interventions have been given to patient including the [place] [facility], [address] quit [place], as well as Cancer Center specific smoking cessation programs.  Written order for lung cancer screening with LDCT has been given to the patient and any and all questions have been answered to the best of my abilities.   Yearly follow up will be scheduled by Shawn Perkins, Thoracic Navigator.   

## 2016-05-07 ENCOUNTER — Telehealth: Payer: Self-pay | Admitting: *Deleted

## 2016-05-07 NOTE — Telephone Encounter (Signed)
Notified patient of LDCT lung cancer screening results of Lung Rads 1 finding with recommendation for 12 month follow up imaging. Also notified of incidental finding noted below. Patient verbalizes understanding.   IMPRESSION: 1. Lung-RADS Category 1, negative. Continue annual screening with low-dose chest CT without contrast in 12 months. 2. Emphysema 3. Aortic atherosclerosis and multi vessel coronary artery calcification

## 2016-05-29 ENCOUNTER — Other Ambulatory Visit: Payer: Self-pay | Admitting: Unknown Physician Specialty

## 2016-06-05 ENCOUNTER — Other Ambulatory Visit: Payer: Self-pay | Admitting: Unknown Physician Specialty

## 2016-06-05 ENCOUNTER — Ambulatory Visit (INDEPENDENT_AMBULATORY_CARE_PROVIDER_SITE_OTHER): Payer: Commercial Managed Care - HMO | Admitting: Unknown Physician Specialty

## 2016-06-05 ENCOUNTER — Encounter: Payer: Self-pay | Admitting: Unknown Physician Specialty

## 2016-06-05 VITALS — BP 133/78 | HR 66 | Temp 98.2°F | Ht 73.0 in | Wt 184.4 lb

## 2016-06-05 DIAGNOSIS — I10 Essential (primary) hypertension: Secondary | ICD-10-CM | POA: Diagnosis not present

## 2016-06-05 DIAGNOSIS — R5382 Chronic fatigue, unspecified: Secondary | ICD-10-CM | POA: Diagnosis not present

## 2016-06-05 DIAGNOSIS — R1909 Other intra-abdominal and pelvic swelling, mass and lump: Secondary | ICD-10-CM

## 2016-06-05 DIAGNOSIS — I2510 Atherosclerotic heart disease of native coronary artery without angina pectoris: Secondary | ICD-10-CM | POA: Diagnosis not present

## 2016-06-05 NOTE — Assessment & Plan Note (Signed)
Refer to cardiology for further evaluation.

## 2016-06-05 NOTE — Assessment & Plan Note (Signed)
Stable, continue present medications.   

## 2016-06-05 NOTE — Progress Notes (Signed)
BP 133/78 mmHg  Pulse 66  Temp(Src) 98.2 F (36.8 C)  Ht 6\' 1"  (1.854 m)  Wt 184 lb 6.4 oz (83.643 kg)  BMI 24.33 kg/m2  SpO2 93%   Subjective:    Patient ID: Brendan PrimusLarry Dilone, male    DOB: 02/23/1951, 65 y.o.   MRN: [id number]  HPI: Brendan [name] is a 65 y.o. male  Chief Complaint  Patient presents with  . Diabetes    pt states he is schedueling his eye exam soon  . Hyperlipidemia  . Hypertension  . Fatigue    pt states he feels tired a lot, but does not feel bad   Fatigue Pt states "I'm tired a lot."  He states he wakes up tired and goes to sleep tired.  He has known COPD and CAD.  This is ongoing for about 1 months.  He takes an ASA QD.  Low dose CT noted emphysema and CAD.  He feels his breathing is good.   Hypertension 3 month f/u today for BP Using medications without difficulty Average home BPs Not checking   No problems or lightheadedness No chest pain with exertion or shortness of breath No Edema     Relevant past medical, surgical, family and social history reviewed and updated as indicated. Interim medical history since our last visit reviewed. Allergies and medications reviewed and updated.  Review of Systems  Per HPI unless specifically indicated above     Objective:    BP 133/78 mmHg  Pulse 66  Temp(Src) 98.2 F (36.8 C)  Ht 6\' 1"  (1.854 m)  Wt 184 lb 6.4 oz (83.643 kg)  BMI 24.33 kg/m2  SpO2 93%  Wt Readings from Last 3 Encounters:  06/05/16 184 lb 6.4 oz (83.643 kg)  05/04/16 187 lb (84.823 kg)  03/26/16 187 lb 6.4 oz (85.004 kg)    Physical Exam  Constitutional: He is oriented to person, place, and time. He appears well-developed and well-nourished. No distress.  HENT:  Head: Normocephalic and atraumatic.  Right Ear: Tympanic membrane, external ear and ear canal normal.  Left Ear: Tympanic membrane, external ear and ear canal normal.  Mouth/Throat: Uvula is midline, oropharynx is clear and moist and mucous membranes are normal.  Eyes:  Conjunctivae and lids are normal. Pupils are equal, round, and reactive to light. Right eye exhibits no discharge. Left eye exhibits no discharge. No scleral icterus.  Neck: Normal range of motion. Neck supple. No JVD present. Carotid bruit is not present.  Cardiovascular: Normal rate, regular rhythm and normal heart sounds.  Exam reveals no gallop and no friction rub.   No murmur heard. Pulmonary/Chest: Effort normal and breath sounds normal. No respiratory distress.  Abdominal: Soft. Normal appearance and bowel sounds are normal. He exhibits no distension. There is no splenomegaly or hepatomegaly. There is no tenderness.  Musculoskeletal: Normal range of motion.  Neurological: He is alert and oriented to person, place, and time. He has normal reflexes.  Skin: Skin is warm, dry and intact. No rash noted. No pallor.  Psychiatric: He has a normal mood and affect. His behavior is normal. Judgment and thought content normal.    Results for orders placed or performed in visit on 02/29/16  Comprehensive metabolic panel  Result Value Ref Range   Glucose 92 65 - 99 mg/dL   BUN 12 8 - 27 mg/dL   Creatinine, Ser 4.090.85 0.76 - 1.27 mg/dL   GFR calc non Af Amer 92 >59 mL/min/1.73   GFR calc Af Denyse DagoAmer  106 >59 mL/min/1.73   BUN/Creatinine Ratio 14 10 - 22   Sodium 144 134 - 144 mmol/L   Potassium 3.7 3.5 - 5.2 mmol/L   Chloride 103 96 - 106 mmol/L   CO2 23 18 - 29 mmol/L   Calcium 9.4 8.6 - 10.2 mg/dL   Total Protein 6.7 6.0 - 8.5 g/dL   Albumin 4.7 3.6 - 4.8 g/dL   Globulin, Total 2.0 1.5 - 4.5 g/dL   Albumin/Globulin Ratio 2.4 1.1 - 2.5   Bilirubin Total 0.4 0.0 - 1.2 mg/dL   Alkaline Phosphatase 76 39 - 117 IU/L   AST 10 0 - 40 IU/L   ALT 16 0 - 44 IU/L  Bayer DCA Hb A1c Waived  Result Value Ref Range   Bayer DCA Hb A1c Waived 6.2 <7.0 %  Lipid Panel Piccolo, Waived  Result Value Ref Range   Cholesterol Piccolo, Waived 111 <200 mg/dL   HDL Chol Piccolo, Waived 39 (L) >59 mg/dL    Triglycerides Piccolo,Waived 73 <150 mg/dL   Chol/HDL Ratio Piccolo,Waive 2.8 mg/dL   LDL Chol Calc Piccolo Waived 57 <100 mg/dL   VLDL Chol Calc Piccolo,Waive 15 <30 mg/dL  Uric acid  Result Value Ref Range   Uric Acid 5.5 3.7 - 8.6 mg/dL  PSA  Result Value Ref Range   Prostate Specific Ag, Serum 0.3 0.0 - 4.0 ng/mL      Assessment & Plan:   Problem List Items Addressed This Visit      Unprioritized   CAD (coronary artery disease)    Refer to cardiology for further evaluation      Relevant Orders   Ambulatory referral to Cardiology   Hypertension    Stable, continue present medications.         Other Visit Diagnoses    Chronic fatigue    -  Primary    Relevant Orders    CBC with Differential/Platelet    Comprehensive metabolic panel    TSH        Follow up plan: Return in about 3 months (around 09/05/2016).

## 2016-06-06 ENCOUNTER — Encounter: Payer: Self-pay | Admitting: Unknown Physician Specialty

## 2016-06-06 ENCOUNTER — Other Ambulatory Visit: Payer: Self-pay | Admitting: Unknown Physician Specialty

## 2016-06-06 LAB — CBC WITH DIFFERENTIAL/PLATELET
BASOS ABS: 0 10*3/uL (ref 0.0–0.2)
Basos: 0 %
EOS (ABSOLUTE): 0.3 10*3/uL (ref 0.0–0.4)
Eos: 3 %
HEMOGLOBIN: 16.7 g/dL (ref 12.6–17.7)
Hematocrit: 48.2 % (ref 37.5–51.0)
IMMATURE GRANULOCYTES: 0 %
Immature Grans (Abs): 0 10*3/uL (ref 0.0–0.1)
LYMPHS ABS: 2.7 10*3/uL (ref 0.7–3.1)
LYMPHS: 27 %
MCH: 31.5 pg (ref 26.6–33.0)
MCHC: 34.6 g/dL (ref 31.5–35.7)
MCV: 91 fL (ref 79–97)
Monocytes Absolute: 0.7 10*3/uL (ref 0.1–0.9)
Monocytes: 7 %
Neutrophils Absolute: 6.1 10*3/uL (ref 1.4–7.0)
Neutrophils: 63 %
Platelets: 199 10*3/uL (ref 150–379)
RBC: 5.31 x10E6/uL (ref 4.14–5.80)
RDW: 14.7 % (ref 12.3–15.4)
WBC: 9.9 10*3/uL (ref 3.4–10.8)

## 2016-06-06 LAB — COMPREHENSIVE METABOLIC PANEL
ALT: 11 IU/L (ref 0–44)
AST: 10 IU/L (ref 0–40)
Albumin/Globulin Ratio: 2.5 — ABNORMAL HIGH (ref 1.2–2.2)
Albumin: 4.8 g/dL (ref 3.6–4.8)
Alkaline Phosphatase: 77 IU/L (ref 39–117)
BUN / CREAT RATIO: 16 (ref 10–24)
BUN: 13 mg/dL (ref 8–27)
Bilirubin Total: 0.2 mg/dL (ref 0.0–1.2)
CHLORIDE: 103 mmol/L (ref 96–106)
CO2: 24 mmol/L (ref 18–29)
CREATININE: 0.82 mg/dL (ref 0.76–1.27)
Calcium: 9.7 mg/dL (ref 8.6–10.2)
GFR calc non Af Amer: 93 mL/min/{1.73_m2} (ref >59)
GFR, EST AFRICAN AMERICAN: 107 mL/min/{1.73_m2} (ref >59)
GLUCOSE: 91 mg/dL (ref 65–99)
Globulin, Total: 1.9 g/dL (ref 1.5–4.5)
Potassium: 4.3 mmol/L (ref 3.5–5.2)
Sodium: 144 mmol/L (ref 134–144)
TOTAL PROTEIN: 6.7 g/dL (ref 6.0–8.5)

## 2016-06-06 LAB — TSH: TSH: 1.46 u[IU]/mL (ref 0.450–4.500)

## 2016-06-12 DIAGNOSIS — I25118 Atherosclerotic heart disease of native coronary artery with other forms of angina pectoris: Secondary | ICD-10-CM | POA: Diagnosis not present

## 2016-06-12 DIAGNOSIS — E781 Pure hyperglyceridemia: Secondary | ICD-10-CM | POA: Diagnosis not present

## 2016-06-12 DIAGNOSIS — R0602 Shortness of breath: Secondary | ICD-10-CM | POA: Diagnosis not present

## 2016-06-12 DIAGNOSIS — I10 Essential (primary) hypertension: Secondary | ICD-10-CM | POA: Diagnosis not present

## 2016-06-26 DIAGNOSIS — E119 Type 2 diabetes mellitus without complications: Secondary | ICD-10-CM | POA: Diagnosis not present

## 2016-06-26 DIAGNOSIS — H2513 Age-related nuclear cataract, bilateral: Secondary | ICD-10-CM | POA: Diagnosis not present

## 2016-06-26 LAB — HM DIABETES EYE EXAM

## 2016-06-28 DIAGNOSIS — I2510 Atherosclerotic heart disease of native coronary artery without angina pectoris: Secondary | ICD-10-CM | POA: Diagnosis not present

## 2016-06-28 DIAGNOSIS — R0602 Shortness of breath: Secondary | ICD-10-CM | POA: Diagnosis not present

## 2016-06-28 DIAGNOSIS — I10 Essential (primary) hypertension: Secondary | ICD-10-CM | POA: Diagnosis not present

## 2016-06-28 DIAGNOSIS — R001 Bradycardia, unspecified: Secondary | ICD-10-CM | POA: Diagnosis not present

## 2016-07-02 ENCOUNTER — Ambulatory Visit (INDEPENDENT_AMBULATORY_CARE_PROVIDER_SITE_OTHER): Payer: Commercial Managed Care - HMO | Admitting: Unknown Physician Specialty

## 2016-07-02 ENCOUNTER — Encounter: Payer: Self-pay | Admitting: Unknown Physician Specialty

## 2016-07-02 VITALS — BP 157/90 | HR 56 | Temp 98.1°F | Ht 74.5 in | Wt 188.2 lb

## 2016-07-02 DIAGNOSIS — H6501 Acute serous otitis media, right ear: Secondary | ICD-10-CM | POA: Diagnosis not present

## 2016-07-02 MED ORDER — AMOXICILLIN 875 MG PO TABS: 875.0000 mg | ORAL_TABLET | Freq: Two times a day (BID) | ORAL | Status: DC

## 2016-07-02 NOTE — Progress Notes (Signed)
BP 157/90 mmHg  Pulse 56  Temp(Src) 98.1 F (36.7 C)  Ht 6' 2.5" (1.892 m)  Wt 188 lb 3.2 oz (85.367 kg)  BMI 23.85 kg/m2  SpO2 93%   Subjective:    Patient ID: Brendan [name], male    DOB: 1951-07-30, 65 y.o.   MRN: [id number]  HPI: Brendan [name] is a 65 y.o. male  Chief Complaint  Patient presents with  . Ear Fullness    pt states his right ear has been bothering him for about 4 days now    Ear  States his "down in a barrel"  It has an echo.  No pain.  Some nasal congestion bu pain right maxillary area.  Uses Flonase daily.    Relevant past medical, surgical, family and social history reviewed and updated as indicated. Interim medical history since our last visit reviewed. Allergies and medications reviewed and updated.  Review of Systems  Per HPI unless specifically indicated above     Objective:    BP 157/90 mmHg  Pulse 56  Temp(Src) 98.1 F (36.7 C)  Ht 6' 2.5" (1.892 m)  Wt 188 lb 3.2 oz (85.367 kg)  BMI 23.85 kg/m2  SpO2 93%  Wt Readings from Last 3 Encounters:  07/02/16 188 lb 3.2 oz (85.367 kg)  06/05/16 184 lb 6.4 oz (83.643 kg)  05/04/16 187 lb (84.823 kg)    Physical Exam  Constitutional: He is oriented to person, place, and time. He appears well-developed and well-nourished. No distress.  HENT:  Head: Normocephalic and atraumatic.  Eyes: Conjunctivae and lids are normal. Right eye exhibits no discharge. Left eye exhibits no discharge. No scleral icterus.  Neck: Normal range of motion. Neck supple. No JVD present. Carotid bruit is not present.  Cardiovascular: Normal rate, regular rhythm and normal heart sounds.   Pulmonary/Chest: Effort normal and breath sounds normal. No respiratory distress.  Abdominal: Normal appearance. There is no splenomegaly or hepatomegaly.  Musculoskeletal: Normal range of motion.  Neurological: He is alert and oriented to person, place, and time.  Skin: Skin is warm, dry and intact. No rash noted. No pallor.  Psychiatric:  He has a normal mood and affect. His behavior is normal. Judgment and thought content normal.    Results for orders placed or performed in visit on 06/05/16  CBC with Differential/Platelet  Result Value Ref Range   WBC 9.9 3.4 - 10.8 x10E3/uL   RBC 5.31 4.14 - 5.80 x10E6/uL   Hemoglobin 16.7 12.6 - 17.7 g/dL   Hematocrit 82.948.2 56.237.5 - 51.0 %   MCV 91 79 - 97 fL   MCH 31.5 26.6 - 33.0 pg   MCHC 34.6 31.5 - 35.7 g/dL   RDW 13.014.7 86.512.3 - 78.415.4 %   Platelets 199 150 - 379 x10E3/uL   Neutrophils 63 %   Lymphs 27 %   Monocytes 7 %   Eos 3 %   Basos 0 %   Neutrophils Absolute 6.1 1.4 - 7.0 x10E3/uL   Lymphocytes Absolute 2.7 0.7 - 3.1 x10E3/uL   Monocytes Absolute 0.7 0.1 - 0.9 x10E3/uL   EOS (ABSOLUTE) 0.3 0.0 - 0.4 x10E3/uL   Basophils Absolute 0.0 0.0 - 0.2 x10E3/uL   Immature Granulocytes 0 %   Immature Grans (Abs) 0.0 0.0 - 0.1 x10E3/uL  Comprehensive metabolic panel  Result Value Ref Range   Glucose 91 65 - 99 mg/dL   BUN 13 8 - 27 mg/dL   Creatinine, Ser 6.960.82 0.76 - 1.27 mg/dL   GFR  calc non Af Amer 93 >59 mL/min/1.73   GFR calc Af Amer 107 >59 mL/min/1.73   BUN/Creatinine Ratio 16 10 - 24   Sodium 144 134 - 144 mmol/L   Potassium 4.3 3.5 - 5.2 mmol/L   Chloride 103 96 - 106 mmol/L   CO2 24 18 - 29 mmol/L   Calcium 9.7 8.6 - 10.2 mg/dL   Total Protein 6.7 6.0 - 8.5 g/dL   Albumin 4.8 3.6 - 4.8 g/dL   Globulin, Total 1.9 1.5 - 4.5 g/dL   Albumin/Globulin Ratio 2.5 (H) 1.2 - 2.2   Bilirubin Total 0.2 0.0 - 1.2 mg/dL   Alkaline Phosphatase 77 39 - 117 IU/L   AST 10 0 - 40 IU/L   ALT 11 0 - 44 IU/L  TSH  Result Value Ref Range   TSH 1.460 0.450 - 4.500 uIU/mL      Assessment & Plan:   Problem List Items Addressed This Visit    None    Visit Diagnoses    Right acute serous otitis media, recurrence not specified    -  Primary    ? related to sinusitis.  1 week of Amoxil.  He is already taking flonase and only misses on occasion.      Relevant Medications     amoxicillin (AMOXIL) 875 MG tablet        Follow up plan: Return if symptoms worsen or fail to improve.

## 2016-07-26 DIAGNOSIS — R0602 Shortness of breath: Secondary | ICD-10-CM | POA: Diagnosis not present

## 2016-07-26 DIAGNOSIS — I2510 Atherosclerotic heart disease of native coronary artery without angina pectoris: Secondary | ICD-10-CM | POA: Diagnosis not present

## 2016-07-26 DIAGNOSIS — I10 Essential (primary) hypertension: Secondary | ICD-10-CM | POA: Diagnosis not present

## 2016-07-26 DIAGNOSIS — R001 Bradycardia, unspecified: Secondary | ICD-10-CM | POA: Diagnosis not present

## 2016-08-06 ENCOUNTER — Other Ambulatory Visit: Payer: Self-pay | Admitting: Unknown Physician Specialty

## 2016-09-17 ENCOUNTER — Encounter: Payer: Self-pay | Admitting: Unknown Physician Specialty

## 2016-09-17 ENCOUNTER — Ambulatory Visit (INDEPENDENT_AMBULATORY_CARE_PROVIDER_SITE_OTHER): Payer: Commercial Managed Care - HMO | Admitting: Unknown Physician Specialty

## 2016-09-17 ENCOUNTER — Other Ambulatory Visit: Payer: Self-pay

## 2016-09-17 VITALS — BP 133/79 | HR 70 | Temp 98.3°F | Ht 73.2 in | Wt 182.0 lb

## 2016-09-17 DIAGNOSIS — I10 Essential (primary) hypertension: Secondary | ICD-10-CM | POA: Diagnosis not present

## 2016-09-17 DIAGNOSIS — E119 Type 2 diabetes mellitus without complications: Secondary | ICD-10-CM | POA: Diagnosis not present

## 2016-09-17 DIAGNOSIS — Z23 Encounter for immunization: Secondary | ICD-10-CM | POA: Diagnosis not present

## 2016-09-17 LAB — HEMOGLOBIN A1C

## 2016-09-17 LAB — BAYER DCA HB A1C WAIVED: HB A1C: 5.9 % (ref <7.0)

## 2016-09-17 MED ORDER — AMLODIPINE BESYLATE 10 MG PO TABS: 10.0000 mg | ORAL_TABLET | Freq: Every day | ORAL | 1 refills | Status: DC

## 2016-09-17 MED ORDER — METFORMIN HCL 500 MG PO TABS: 500.0000 mg | ORAL_TABLET | Freq: Two times a day (BID) | ORAL | 1 refills | Status: DC

## 2016-09-17 MED ORDER — ATORVASTATIN CALCIUM 40 MG PO TABS: 40.0000 mg | ORAL_TABLET | Freq: Every day | ORAL | 1 refills | Status: DC

## 2016-09-17 MED ORDER — LISINOPRIL 40 MG PO TABS: 40.0000 mg | ORAL_TABLET | Freq: Every day | ORAL | 1 refills | Status: DC

## 2016-09-17 MED ORDER — ALBUTEROL SULFATE HFA 108 (90 BASE) MCG/ACT IN AERS: 2.0000 | INHALATION_SPRAY | Freq: Four times a day (QID) | RESPIRATORY_TRACT | 2 refills | Status: DC | PRN

## 2016-09-17 NOTE — Assessment & Plan Note (Signed)
Stable, continue present medications.   

## 2016-09-17 NOTE — Assessment & Plan Note (Signed)
Hgb A1 C is 5.9.  Continue present medications

## 2016-09-17 NOTE — Progress Notes (Signed)
--  BP 133/79 (BP Location: Left Arm, Patient Position: Sitting, Cuff Size: Large)   Pulse 70   Temp 98.3 F (36.8 C)   Ht 6' 1.2" (1.859 m)   Wt 182 lb (82.6 kg)   SpO2 93%   BMI 23.88 kg/m    Subjective:    Patient ID: Brendan [name], male    DOB: 04-Oct-1951, 65 y.o.   MRN: [id number]  HPI: Brendan [name] is a 65 y.o. male  Chief Complaint  Patient presents with  . Diabetes  . Hyperlipidemia  . Hypertension   Fatigue This is improved since stopped taking Atenolol with the recommendation of Cardiology  Diabetes: Using medications without difficulties No hypoglycemic episodes No hyperglycemic episodes Feet problems: numbness without pain Blood Sugars averaging: high 90s to 110 eye exam within last year Last Hgb A1C: 6.2  Hypertension  Using medications without difficulty Average home BPs   Using medication without problems or lightheadedness No chest pain with exertion or shortness of breath No Edema  Elevated Cholesterol Using medications without problems No Muscle aches  Diet: good Exercise: works a physical job  Leg weakness After walking for a period of time   Relevant past medical, surgical, family and social history reviewed and updated as indicated. Interim medical history since our last visit reviewed. Allergies and medications reviewed and updated.  Review of Systems  Per HPI unless specifically indicated above     Objective:    BP 133/79 (BP Location: Left Arm, Patient Position: Sitting, Cuff Size: Large)   Pulse 70   Temp 98.3 F (36.8 C)   Ht 6' 1.2" (1.859 m)   Wt 182 lb (82.6 kg)   SpO2 93%   BMI 23.88 kg/m   Wt Readings from Last 3 Encounters:  09/17/16 182 lb (82.6 kg)  07/02/16 188 lb 3.2 oz (85.4 kg)  06/05/16 184 lb 6.4 oz (83.6 kg)    Physical Exam  Constitutional: He is oriented to person, place, and time. He appears well-developed and well-nourished. No distress.  HENT:  Head: Normocephalic and atraumatic.  Eyes:  Conjunctivae and lids are normal. Right eye exhibits no discharge. Left eye exhibits no discharge. No scleral icterus.  Neck: Normal range of motion. Neck supple. No JVD present. Carotid bruit is not present.  Cardiovascular: Normal rate, regular rhythm and normal heart sounds.   Pulmonary/Chest: Effort normal and breath sounds normal. No respiratory distress.  Abdominal: Normal appearance. There is no splenomegaly or hepatomegaly.  Musculoskeletal: Normal range of motion.  Neurological: He is alert and oriented to person, place, and time.  Skin: Skin is warm, dry and intact. No rash noted. No pallor.  Psychiatric: He has a normal mood and affect. His behavior is normal. Judgment and thought content normal.    Results for orders placed or performed in visit on 06/26/16  HM DIABETES EYE EXAM  Result Value Ref Range   HM Diabetic Eye Exam No Retinopathy No Retinopathy      Assessment & Plan:   Problem List Items Addressed This Visit      Unprioritized   Diabetes mellitus type 2, controlled, without complications (HCC)    Hgb A1 C is 5.9.  Continue present medications      Relevant Orders   Bayer DCA Hb A1c Waived   Hypertension    Stable, continue present medications.         Other Visit Diagnoses    Immunization due    -  Primary   Relevant Orders  Flu vaccine HIGH DOSE PF (Completed)       Follow up plan: Return in about 6 months (around 03/17/2017) for physical.

## 2016-09-17 NOTE — Patient Instructions (Addendum)

## 2017-01-02 ENCOUNTER — Ambulatory Visit: Payer: Commercial Managed Care - HMO | Admitting: Family Medicine

## 2017-01-03 ENCOUNTER — Ambulatory Visit (INDEPENDENT_AMBULATORY_CARE_PROVIDER_SITE_OTHER): Payer: Medicare Other | Admitting: Family Medicine

## 2017-01-03 ENCOUNTER — Encounter: Payer: Self-pay | Admitting: Family Medicine

## 2017-01-03 VITALS — BP 166/83 | HR 67 | Temp 97.2°F | Wt 189.0 lb

## 2017-01-03 DIAGNOSIS — J069 Acute upper respiratory infection, unspecified: Secondary | ICD-10-CM

## 2017-01-03 MED ORDER — AZITHROMYCIN 250 MG PO TABS: ORAL_TABLET | ORAL | 0 refills | Status: DC

## 2017-01-03 NOTE — Progress Notes (Signed)
   BP (!) 166/83   Pulse 67   Temp 97.2 F (36.2 C)   Wt 189 lb (85.7 kg)   SpO2 96%   BMI 24.80 kg/m    Subjective:    Patient ID: Brendan [name], male    DOB: 1951/11/06, 66 y.o.   MRN: [id number]  HPI: Brendan [name] is a 66 y.o. male  Chief Complaint  Patient presents with  . URI    x 1 week, chest and head congestion, nasal congestion, some runny nose, productive cough. No fever, no body aches.    Patient presents with 1 week history of congestion, productive cough, and sore throat. States symptoms have been worsening the last few days. Denies fever, chills, aches, CP. Taking mucinex with mild relief.  Hx of smoking - 50 pack years. Not ready to quit.   Relevant past medical, surgical, family and social history reviewed and updated as indicated. Interim medical history since our last visit reviewed. Allergies and medications reviewed and updated.  Review of Systems  Constitutional: Negative.   HENT: Positive for congestion and sore throat.   Eyes: Negative.   Respiratory: Positive for cough.   Cardiovascular: Negative.   Gastrointestinal: Negative.   Genitourinary: Negative.   Musculoskeletal: Negative.   Neurological: Negative.   Psychiatric/Behavioral: Negative.     Per HPI unless specifically indicated above     Objective:    BP (!) 166/83   Pulse 67   Temp 97.2 F (36.2 C)   Wt 189 lb (85.7 kg)   SpO2 96%   BMI 24.80 kg/m   Wt Readings from Last 3 Encounters:  01/03/17 189 lb (85.7 kg)  09/17/16 182 lb (82.6 kg)  07/02/16 188 lb 3.2 oz (85.4 kg)    Physical Exam  Constitutional: He is oriented to person, place, and time. He appears well-developed and well-nourished. No distress.  HENT:  Head: Atraumatic.  Right Ear: External ear normal.  Left Ear: External ear normal.  Nose: Nose normal.  Oropharynx erythematous  Eyes: Conjunctivae are normal. Pupils are equal, round, and reactive to light. No scleral icterus.  Neck: Normal range of motion. Neck  supple.  Cardiovascular: Normal rate and normal heart sounds.   Pulmonary/Chest: Effort normal and breath sounds normal. No respiratory distress. He has no wheezes.  Musculoskeletal: Normal range of motion.  Lymphadenopathy:    He has no cervical adenopathy.  Neurological: He is alert and oriented to person, place, and time.  Skin: Skin is warm and dry.  Psychiatric: He has a normal mood and affect. His behavior is normal.  Nursing note and vitals reviewed.     Assessment & Plan:   Problem List Items Addressed This Visit    None    Visit Diagnoses    Upper respiratory tract infection, unspecified type    -  Primary   Z-pak sent. Continue mucinex, flonase, supportive care. Follow up if no improvement.    Relevant Medications   azithromycin (ZITHROMAX) 250 MG tablet       Follow up plan: Return if symptoms worsen or fail to improve.

## 2017-01-22 ENCOUNTER — Telehealth: Payer: Self-pay

## 2017-01-22 DIAGNOSIS — J342 Deviated nasal septum: Secondary | ICD-10-CM | POA: Diagnosis not present

## 2017-01-22 DIAGNOSIS — J301 Allergic rhinitis due to pollen: Secondary | ICD-10-CM | POA: Diagnosis not present

## 2017-01-22 NOTE — Telephone Encounter (Signed)
Abby from [facility] called and stated that Dr. Andee PolesVaught wanted to know if we could change this patient's lisinopril to losartan. She stated that the lisinopril has been know to cause air way swelling and angioedema. She asked for me to call her back at [phone].

## 2017-01-23 MED ORDER — LOSARTAN POTASSIUM 100 MG PO TABS: 100.0000 mg | ORAL_TABLET | Freq: Every day | ORAL | 3 refills | Status: DC

## 2017-01-23 NOTE — Telephone Encounter (Signed)
Done but pt will need to be seen in about 4 weeks for BP f/u

## 2017-01-23 NOTE — Telephone Encounter (Signed)
Called and let Abby know about medication change. Will call and notify patient about medication and schedule 1 month f/up for BP.

## 2017-01-23 NOTE — Telephone Encounter (Signed)
Called to speak with patient but a lady answered and said the patient was not in. I asked for the patient to please return my call when he could and the lady stated that she would tell him.

## 2017-01-24 NOTE — Telephone Encounter (Signed)
Tried calling patient, no answer and no VM. I see that patient has a f/up scheduled for 02/13/17 but unsure if patient knows about medication change. Will try to call again tomorrow.

## 2017-01-25 NOTE — Telephone Encounter (Signed)
Called and spoke with patient. He stated that he did not know about medication change. I let the patient know that he was to start taking losartan instead of lisinopril and that it was already at his pharmacy. Patient had follow up scheduled for 02/13/17 to see us for BP f/up but he stated that he had another appointment that day. Appointment changed to 02/18/17 for BP f/up.

## 2017-01-25 NOTE — Telephone Encounter (Signed)
Called and spoke with a lady. She stated that the patient was at work and would be home around 12:30. Will try to call back after lunch.

## 2017-02-13 ENCOUNTER — Telehealth: Payer: Self-pay

## 2017-02-13 ENCOUNTER — Ambulatory Visit: Payer: Medicare Other | Admitting: Unknown Physician Specialty

## 2017-02-13 DIAGNOSIS — J301 Allergic rhinitis due to pollen: Secondary | ICD-10-CM | POA: Diagnosis not present

## 2017-02-13 DIAGNOSIS — J342 Deviated nasal septum: Secondary | ICD-10-CM | POA: Diagnosis not present

## 2017-02-13 DIAGNOSIS — J3489 Other specified disorders of nose and nasal sinuses: Secondary | ICD-10-CM | POA: Diagnosis not present

## 2017-02-13 NOTE — Telephone Encounter (Signed)
Tina from Dr. Gregary CromerVaught's office [facility]) called and stated that patient was switched from lisinopril to losartan a few weeks ago for risks of angioedema. Inetta Fermoina states that the patient would like to be switched back to lisinopril 40 mg because the losartan has made the patient very tired.

## 2017-02-14 MED ORDER — LISINOPRIL 40 MG PO TABS: 40.0000 mg | ORAL_TABLET | Freq: Every day | ORAL | 3 refills | Status: DC

## 2017-02-14 NOTE — Telephone Encounter (Signed)
Patient called [place] back left a message for her to call him back tomorrow.  Thanks

## 2017-02-14 NOTE — Telephone Encounter (Signed)
Called and spoke with a lady who said the patient was not in right now. I asked for the patient to give me a call back when he could. I offered to give the office phone number but the lady stated that it was on the phone.

## 2017-02-15 NOTE — Telephone Encounter (Signed)
Tried calling patient but phone just kept ringing. Will try to call again later.

## 2017-02-15 NOTE — Telephone Encounter (Signed)
Called and let patient know about medication change.  

## 2017-02-18 ENCOUNTER — Ambulatory Visit (INDEPENDENT_AMBULATORY_CARE_PROVIDER_SITE_OTHER): Payer: Medicare Other | Admitting: Unknown Physician Specialty

## 2017-02-18 ENCOUNTER — Encounter: Payer: Self-pay | Admitting: Unknown Physician Specialty

## 2017-02-18 DIAGNOSIS — I10 Essential (primary) hypertension: Secondary | ICD-10-CM | POA: Diagnosis not present

## 2017-02-18 DIAGNOSIS — J432 Centrilobular emphysema: Secondary | ICD-10-CM | POA: Insufficient documentation

## 2017-02-18 DIAGNOSIS — J42 Unspecified chronic bronchitis: Secondary | ICD-10-CM | POA: Diagnosis not present

## 2017-02-18 DIAGNOSIS — J449 Chronic obstructive pulmonary disease, unspecified: Secondary | ICD-10-CM | POA: Insufficient documentation

## 2017-02-18 NOTE — Progress Notes (Signed)
   BP 126/70 (BP Location: Left Arm, Cuff Size: Normal)   Pulse 67   Temp 98.3 F (36.8 C)   Wt 187 lb 3.2 oz (84.9 kg)   SpO2 93%   BMI 24.56 kg/m    Subjective:    Patient ID: Brendan [name], male    DOB: 22-Aug-1951, 66 y.o.   MRN: [id number]  HPI: Brendan [name] is a 66 y.o. male  Chief Complaint  Patient presents with  . Hypertension    4 week f/up, patient states he started back on lisinopril this past weekend   Hypertension Switched from Lisinopril to Losartan for angioedema but it "didn't agree with me" and switched back to Lisinopril.  States Losartan made him tired.  However he is having some eye swelling but does not want to switch to anything else   Average home BPs Not checking.    No problems or lightheadedness No chest pain with exertion or shortness of breath No Edema  COPD Feels symptoms are well controlled Using medications without problems: Night time symptoms:none ER visits since last visit:none Missed work or school::none Increased cough:no Increased SOB:no Using O2:no    Relevant past medical, surgical, family and social history reviewed and updated as indicated. Interim medical history since our last visit reviewed. Allergies and medications reviewed and updated.  Review of Systems  Per HPI unless specifically indicated above     Objective:    BP 126/70 (BP Location: Left Arm, Cuff Size: Normal)   Pulse 67   Temp 98.3 F (36.8 C)   Wt 187 lb 3.2 oz (84.9 kg)   SpO2 93%   BMI 24.56 kg/m   Wt Readings from Last 3 Encounters:  02/18/17 187 lb 3.2 oz (84.9 kg)  01/03/17 189 lb (85.7 kg)  09/17/16 182 lb (82.6 kg)    Physical Exam  Constitutional: He is oriented to person, place, and time. He appears well-developed and well-nourished. No distress.  HENT:  Head: Normocephalic and atraumatic.  Eyes: Conjunctivae and lids are normal. Right eye exhibits no discharge. Left eye exhibits no discharge. No scleral icterus.  Neck: Normal range of  motion. Neck supple. No JVD present. Carotid bruit is not present.  Cardiovascular: Normal rate, regular rhythm and normal heart sounds.   Pulmonary/Chest: Effort normal and breath sounds normal. No respiratory distress.  Abdominal: Normal appearance. There is no splenomegaly or hepatomegaly.  Musculoskeletal: Normal range of motion.  Neurological: He is alert and oriented to person, place, and time.  Skin: Skin is warm, dry and intact. No rash noted. No pallor.  Psychiatric: He has a normal mood and affect. His behavior is normal. Judgment and thought content normal.    Results for orders placed or performed in visit on 09/17/16  Hemoglobin A1c  Result Value Ref Range   Hemoglobin A1C 5.9%       Assessment & Plan:   Problem List Items Addressed This Visit      Unprioritized   COPD (chronic obstructive pulmonary disease) (HCC)   Relevant Medications   azelastine (ASTELIN) 0.1 % nasal spray   montelukast (SINGULAIR) 10 MG tablet   Hypertension    Recheck BP 126/70.  Continue present medication as side effects not a problem for my patients          Follow up plan: Return in about 4 weeks (around 03/18/2017) for physical.

## 2017-02-18 NOTE — Assessment & Plan Note (Signed)
Recheck BP 126/70.  Continue present medication as side effects not a problem for my patients

## 2017-02-22 ENCOUNTER — Other Ambulatory Visit: Payer: Self-pay

## 2017-02-22 MED ORDER — TAMSULOSIN HCL 0.4 MG PO CAPS: 0.4000 mg | ORAL_CAPSULE | Freq: Every day | ORAL | 3 refills | Status: DC

## 2017-03-19 ENCOUNTER — Encounter: Payer: Self-pay | Admitting: Unknown Physician Specialty

## 2017-03-19 ENCOUNTER — Ambulatory Visit (INDEPENDENT_AMBULATORY_CARE_PROVIDER_SITE_OTHER): Payer: Medicare Other | Admitting: Unknown Physician Specialty

## 2017-03-19 VITALS — BP 143/87 | HR 65 | Temp 98.3°F | Ht 73.3 in | Wt 181.9 lb

## 2017-03-19 DIAGNOSIS — I2510 Atherosclerotic heart disease of native coronary artery without angina pectoris: Secondary | ICD-10-CM | POA: Diagnosis not present

## 2017-03-19 DIAGNOSIS — J42 Unspecified chronic bronchitis: Secondary | ICD-10-CM | POA: Diagnosis not present

## 2017-03-19 DIAGNOSIS — E7800 Pure hypercholesterolemia, unspecified: Secondary | ICD-10-CM

## 2017-03-19 DIAGNOSIS — Z7189 Other specified counseling: Secondary | ICD-10-CM

## 2017-03-19 DIAGNOSIS — I10 Essential (primary) hypertension: Secondary | ICD-10-CM

## 2017-03-19 DIAGNOSIS — I208 Other forms of angina pectoris: Secondary | ICD-10-CM

## 2017-03-19 DIAGNOSIS — I209 Angina pectoris, unspecified: Secondary | ICD-10-CM | POA: Diagnosis not present

## 2017-03-19 DIAGNOSIS — E119 Type 2 diabetes mellitus without complications: Secondary | ICD-10-CM | POA: Diagnosis not present

## 2017-03-19 DIAGNOSIS — Z87891 Personal history of nicotine dependence: Secondary | ICD-10-CM | POA: Diagnosis not present

## 2017-03-19 DIAGNOSIS — Z0000 Encounter for general adult medical examination without abnormal findings: Secondary | ICD-10-CM

## 2017-03-19 DIAGNOSIS — I2089 Other forms of angina pectoris: Secondary | ICD-10-CM

## 2017-03-19 DIAGNOSIS — Z23 Encounter for immunization: Secondary | ICD-10-CM | POA: Diagnosis not present

## 2017-03-19 LAB — MICROALBUMIN, URINE WAIVED
Creatinine, Urine Waived: 10 mg/dL (ref 10–300)
MICROALB, UR WAIVED: 10 mg/L (ref 0–19)
Microalb/Creat Ratio: <30 mg/g (ref <30)

## 2017-03-19 LAB — BAYER DCA HB A1C WAIVED: HB A1C (BAYER DCA - WAIVED): 6.2 % (ref <7.0)

## 2017-03-19 NOTE — Patient Instructions (Addendum)

## 2017-03-19 NOTE — Progress Notes (Signed)
BP (!) 143/87 (BP Location: Left Arm, Cuff Size: Normal)   Pulse 65   Temp 98.3 F (36.8 C)   Ht 6' 1.3" (1.862 m)   Wt 181 lb 14.4 oz (82.5 kg)   SpO2 91%   BMI 23.80 kg/m    Subjective:    Patient ID: Brendan [name], male    DOB: 05-17-1951, 66 y.o.   MRN: [id number]  HPI: Brendan [name] is a 66 y.o. male  Chief Complaint  Patient presents with  . 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  Elevated Cholesterol Using medications without problems No Muscle aches  Diet: Not watching what he eats Exercise:at work   Functional Status Survey: Is the patient deaf or have difficulty hearing?: Yes Does the patient have difficulty seeing, even when wearing glasses/contacts?: No Does the patient have difficulty concentrating, remembering, or making decisions?: No Does the patient have difficulty walking or climbing stairs?: No Does the patient have difficulty dressing or bathing?: No Does the patient have difficulty doing errands alone such as visiting a doctor's office or shopping?: No Fall Risk  03/19/2017 06/05/2016  Falls in the past year? No No   Depression screen [facility] 2/9 03/19/2017 06/05/2016  Decreased Interest 0 0  Down, Depressed, Hopeless 0 0  PHQ - 2 Score 0 0  Altered sleeping 0 -  Tired, decreased energy 1 -  Change in appetite 0 -  Feeling bad or failure about yourself  0 -  Trouble concentrating 0 -  Moving slowly or fidgety/restless 0 -  Suicidal thoughts 0 -  PHQ-9 Score 1 -   Social History   Social History  .  Marital status: Divorced    Spouse name: N/A  . Number of children: N/A  . Years of education: N/A   Social History Main Topics  . Smoking status: Current Every Day Smoker    Packs/day: 1.00    Years: 40.00    Types: Cigarettes  . Smokeless tobacco: Never Used  . Alcohol use No  . Drug use: No  . Sexual activity: Yes   Other Topics Concern  . None   Social History Narrative  . None   Family History  Problem Relation Age of Onset  . Hypertension Mother   . Diabetes Mother   . Heart disease Father     MI   Past Medical History:  Diagnosis Date  . Diabetes mellitus without complication (HCC)   . Hyperlipidemia   . Hypertension   . Personal history of tobacco use, presenting hazards to health 05/03/2016   Past Surgical History:  Procedure Laterality Date  . BACK SURGERY    . KNEE SURGERY      Relevant past medical, surgical, family and social history reviewed and updated as indicated. Interim medical history since our last visit reviewed. Allergies and medications reviewed and updated.  Review of Systems  Constitutional: Negative.   HENT: Negative.   Eyes: Negative.   Respiratory: Negative.   Cardiovascular: Negative.   Gastrointestinal: Negative.   Endocrine: Negative.   Genitourinary: Negative.   Musculoskeletal: Negative.   Skin:  Discolored dry scaley skin on legs  Neurological: Negative.   Hematological: Negative.   Psychiatric/Behavioral: Negative.     Per HPI unless specifically indicated above     Objective:    BP (!) 143/87 (BP Location: Left Arm, Cuff Size: Normal)   Pulse 65   Temp 98.3 F (36.8 C)   Ht 6' 1.3" (1.862 m)   Wt 181 lb 14.4 oz (82.5 kg)   SpO2 91%   BMI 23.80 kg/m   Wt Readings from Last 3 Encounters:  03/19/17 181 lb 14.4 oz (82.5 kg)  02/18/17 187 lb 3.2 oz (84.9 kg)  01/03/17 189 lb (85.7 kg)    Physical Exam  Constitutional: He is oriented to person, place, and time. He appears well-developed and  well-nourished.  HENT:  Head: Normocephalic.  Right Ear: Tympanic membrane, external ear and ear canal normal.  Left Ear: Tympanic membrane, external ear and ear canal normal.  Mouth/Throat: Uvula is midline, oropharynx is clear and moist and mucous membranes are normal.  Eyes: Pupils are equal, round, and reactive to light.  Cardiovascular: Normal rate, regular rhythm and normal heart sounds.  Exam reveals no gallop and no friction rub.   No murmur heard. Pulmonary/Chest: Effort normal and breath sounds normal. No respiratory distress.  Abdominal: Soft. Bowel sounds are normal. He exhibits no distension. There is no tenderness.  Musculoskeletal: Normal range of motion.  Neurological: He is alert and oriented to person, place, and time. He has normal reflexes.  Skin: Skin is warm and dry.  Dry skin and vascular dermatitis bilateral legs  Psychiatric: He has a normal mood and affect. His behavior is normal. Judgment and thought content normal.   EKG is normal sinus brady  Results for orders placed or performed in visit on 09/17/16  Hemoglobin A1c  Result Value Ref Range   Hemoglobin A1C 5.9%       Assessment & Plan:   Problem List Items Addressed This Visit      Unprioritized   Advanced care planning/counseling discussion    A voluntary discussion about advance care planning including the explanation and discussion of advance directives was extensively discussed  with the patient.  Explanation about the health care proxy and Living will was reviewed and packet with forms with explanation of how to fill them out was given.  During this discussion, the patient was not able to identify a health care proxy at this time and thinking about filling out the paperwork required.  Patient was offered a separate Advance Care Planning visit for further assistance with forms.          CAD (coronary artery disease)    Known CAD lost to f/u with cardiology.  New stable angina.  Refer again to  cardiology.        Relevant Orders   Ambulatory referral to Cardiology   COPD (chronic obstructive pulmonary disease) (HCC)   Diabetes mellitus type 2, controlled, without complications (HCC)   Relevant Orders   Bayer DCA Hb A1c Waived   Microalbumin, Urine Waived   Hyperlipidemia    Continue Atorvastatin      Relevant Orders   Lipid Panel w/o Chol/HDL Ratio   Hypertension    Stable, continue present medications.        Relevant Orders   Comprehensive metabolic panel   Personal history of tobacco use, presenting hazards to health    Encouraged to quit.  Not interested at this time       Other Visit Diagnoses  Need for pneumococcal vaccination    -  Primary   Relevant Orders   Pneumococcal polysaccharide vaccine 23-valent greater than or equal to 2yo subcutaneous/IM (Completed)   Medicare annual wellness visit, initial       Relevant Orders   EKG 12-Lead (Completed)   Annual physical exam       Stable angina (HCC)       Pt with multiple resk factors.  Refer to cardiology.  Discussed to not engage in aggravating activity   Relevant Orders   Ambulatory referral to Cardiology        Follow up plan: Return in about 6 months (around 09/19/2017).

## 2017-03-19 NOTE — Assessment & Plan Note (Signed)
Encouraged to quit. Not interested at this time.  

## 2017-03-19 NOTE — Assessment & Plan Note (Signed)
Known CAD lost to f/u with cardiology.  New stable angina.  Refer again to cardiology.

## 2017-03-19 NOTE — Assessment & Plan Note (Signed)
A voluntary discussion about advance care planning including the explanation and discussion of advance directives was extensively discussed  with the patient.  Explanation about the health care proxy and Living will was reviewed and packet with forms with explanation of how to fill them out was given.  During this discussion, the patient was not able to identify a health care proxy at this time and thinking about filling out the paperwork required.  Patient was offered a separate Advance Care Planning visit for further assistance with forms.

## 2017-03-19 NOTE — Assessment & Plan Note (Signed)
Stable, continue present medications.   

## 2017-03-19 NOTE — Assessment & Plan Note (Signed)
Continue Atorvastatin

## 2017-03-20 LAB — COMPREHENSIVE METABOLIC PANEL
ALT: 14 IU/L (ref 0–44)
AST: 11 IU/L (ref 0–40)
Albumin/Globulin Ratio: 2.2 (ref 1.2–2.2)
Albumin: 4.6 g/dL (ref 3.6–4.8)
Alkaline Phosphatase: 80 IU/L (ref 39–117)
BUN/Creatinine Ratio: 15 (ref 10–24)
BUN: 14 mg/dL (ref 8–27)
Bilirubin Total: <0.2 mg/dL (ref 0.0–1.2)
CALCIUM: 9.4 mg/dL (ref 8.6–10.2)
CO2: 27 mmol/L (ref 18–29)
Chloride: 103 mmol/L (ref 96–106)
Creatinine, Ser: 0.92 mg/dL (ref 0.76–1.27)
GFR calc Af Amer: 101 mL/min/{1.73_m2} (ref >59)
GFR, EST NON AFRICAN AMERICAN: 87 mL/min/{1.73_m2} (ref >59)
GLUCOSE: 92 mg/dL (ref 65–99)
Globulin, Total: 2.1 g/dL (ref 1.5–4.5)
Potassium: 3.7 mmol/L (ref 3.5–5.2)
Sodium: 146 mmol/L — ABNORMAL HIGH (ref 134–144)
TOTAL PROTEIN: 6.7 g/dL (ref 6.0–8.5)

## 2017-03-20 LAB — LIPID PANEL W/O CHOL/HDL RATIO
CHOLESTEROL TOTAL: 110 mg/dL (ref 100–199)
HDL: 36 mg/dL — ABNORMAL LOW (ref >39)
LDL Calculated: 54 mg/dL (ref 0–99)
Triglycerides: 98 mg/dL (ref 0–149)
VLDL CHOLESTEROL CAL: 20 mg/dL (ref 5–40)

## 2017-04-01 ENCOUNTER — Ambulatory Visit (INDEPENDENT_AMBULATORY_CARE_PROVIDER_SITE_OTHER): Payer: Medicare Other | Admitting: Family Medicine

## 2017-04-01 ENCOUNTER — Encounter: Payer: Self-pay | Admitting: Family Medicine

## 2017-04-01 VITALS — BP 133/78 | HR 76 | Temp 97.5°F | Wt 180.2 lb

## 2017-04-01 DIAGNOSIS — Z87891 Personal history of nicotine dependence: Secondary | ICD-10-CM

## 2017-04-01 DIAGNOSIS — I208 Other forms of angina pectoris: Secondary | ICD-10-CM

## 2017-04-01 DIAGNOSIS — J42 Unspecified chronic bronchitis: Secondary | ICD-10-CM | POA: Diagnosis not present

## 2017-04-01 DIAGNOSIS — J302 Other seasonal allergic rhinitis: Secondary | ICD-10-CM | POA: Diagnosis not present

## 2017-04-01 MED ORDER — ALBUTEROL SULFATE (2.5 MG/3ML) 0.083% IN NEBU
2.5000 mg | INHALATION_SOLUTION | Freq: Once | RESPIRATORY_TRACT | Status: AC
  Administered 2017-04-01: 2.5 mg via RESPIRATORY_TRACT

## 2017-04-01 MED ORDER — PREDNISONE 10 MG PO TABS: ORAL_TABLET | ORAL | 0 refills | Status: DC

## 2017-04-01 MED ORDER — AZITHROMYCIN 250 MG PO TABS: ORAL_TABLET | ORAL | 0 refills | Status: DC

## 2017-04-01 NOTE — Assessment & Plan Note (Addendum)
With acute exacerbation. Nebulizer given today with some relief. Will treat with zpak, prednisone taper, continue albuterol prn, mucinex, singulair, nasal spray. Add zyrtec daily. Discussed adding symbicort, but pt adamant against that d/t having to wash mouth out after use.

## 2017-04-01 NOTE — Assessment & Plan Note (Signed)
Encouraged pt to continue considering cessation. Offered several options once he is mentally ready to do so, and assured him that we would be here for support however needed. Discussed cessation classes at [facility].

## 2017-04-01 NOTE — Progress Notes (Signed)
BP 133/78 (BP Location: Left Arm, Patient Position: Sitting, Cuff Size: Normal)   Pulse 76   Temp 97.5 F (36.4 C)   Wt 180 lb 3.2 oz (81.7 kg)   SpO2 94%   BMI 23.58 kg/m    Subjective:    Patient ID: Brendan [name], Brendan HaywoodDOB: August 29, 1951, 66 y.o.   MRN: [id number]  HPI: Brendan [name] is a 66 y.o. male  Chief Complaint  Patient presents with  . Chest Congestion  . Cough    Productive. Clear in color but thick.  . Wheezing  . Nasal Congestion   Almost 1 week of congestion, productive cough, rhinorrhea, and significant wheezing, especially over night. Denies fever, chills, facial pain, body aches. Taking mucinex and robitussin DM as well as his albuterol inhaler prn, only having relief with the albuterol. No sick contacts. Still smoking regularly. Strongly considering quitting this year, states he will think about it and let us know what he decides at his regular follow-up in 6 months.   Past Medical History:  Diagnosis Date  . Diabetes mellitus without complication (HCC)   . Hyperlipidemia   . Hypertension   . Personal history of tobacco use, presenting hazards to health 05/03/2016   Social History   Social History  . Marital status: Divorced    Spouse name: N/A  . Number of children: N/A  . Years of education: N/A   Occupational History  . Not on file.   Social History Main Topics  . Smoking status: Current Every Day Smoker    Packs/day: 1.00    Years: 40.00    Types: Cigarettes  . Smokeless tobacco: Never Used  . Alcohol use No  . Drug use: No  . Sexual activity: Yes   Other Topics Concern  . Not on file   Social History Narrative  . No narrative on file    Relevant past medical, surgical, family and social history reviewed and updated as indicated. Interim medical history since our last visit reviewed. Allergies and medications reviewed and updated.  Review of Systems  Constitutional: Negative.   HENT: Positive for congestion and rhinorrhea.   Eyes:  Negative.   Respiratory: Positive for cough, shortness of breath and wheezing.   Cardiovascular: Negative.   Gastrointestinal: Negative.   Genitourinary: Negative.   Musculoskeletal: Negative.   Neurological: Negative.   Psychiatric/Behavioral: Negative.     Per HPI unless specifically indicated above     Objective:    BP 133/78 (BP Location: Left Arm, Patient Position: Sitting, Cuff Size: Normal)   Pulse 76   Temp 97.5 F (36.4 C)   Wt 180 lb 3.2 oz (81.7 kg)   SpO2 94%   BMI 23.58 kg/m   Wt Readings from Last 3 Encounters:  04/01/17 180 lb 3.2 oz (81.7 kg)  03/19/17 181 lb 14.4 oz (82.5 kg)  02/18/17 187 lb 3.2 oz (84.9 kg)    Physical Exam  Constitutional: He is oriented to person, place, and time. He appears well-developed and well-nourished. No distress.  HENT:  Head: Atraumatic.  Right Ear: External ear normal.  Left Ear: External ear normal.  Nasal mucosa erythematous and edematous Oropharynx erythematous  Eyes: Conjunctivae are normal. Pupils are equal, round, and reactive to light.  Neck: Normal range of motion. Neck supple.  Cardiovascular: Normal rate and normal heart sounds.   Pulmonary/Chest: Effort normal. No respiratory distress. He has wheezes (diffuse wheezes ). He has no rales.  Musculoskeletal: Normal range of motion.  Neurological: He is alert and oriented to person, place, and time.  Skin: Skin is warm and dry.  Psychiatric: He has a normal mood and affect. His behavior is normal.  Nursing note and vitals reviewed.     Assessment & Plan:   Problem List Items Addressed This Visit      Respiratory   Allergic rhinitis    Not adequately controlled currently on singulair and astelin, add zyrtec and flonase daily.       COPD (chronic obstructive pulmonary disease) (HCC) - Primary    With acute exacerbation. Nebulizer given today with some relief. Will treat with zpak, prednisone taper, continue albuterol prn, mucinex, singulair, nasal spray. Add  zyrtec daily. Discussed adding symbicort, but pt adamant against that d/t having to wash mouth out after use.       Relevant Medications   azithromycin (ZITHROMAX) 250 MG tablet   predniSONE (DELTASONE) 10 MG tablet   albuterol (PROVENTIL) (2.5 MG/3ML) 0.083% nebulizer solution 2.5 mg (Completed) (Start on 04/01/2017 10:45 AM)     Other   Personal history of tobacco use, presenting hazards to health    Encouraged pt to continue considering cessation. Offered several options once he is mentally ready to do so, and assured him that we would be here for support however needed. Discussed cessation classes at [facility].       Relevant Medications   albuterol (PROVENTIL) (2.5 MG/3ML) 0.083% nebulizer solution 2.5 mg (Completed) (Start on 04/01/2017 10:45 AM)       Follow up plan: Return for as scheduled.

## 2017-04-01 NOTE — Patient Instructions (Signed)
Follow up as scheduled.  

## 2017-04-01 NOTE — Assessment & Plan Note (Signed)
Not adequately controlled currently on singulair and astelin, add zyrtec and flonase daily.

## 2017-04-02 ENCOUNTER — Ambulatory Visit (INDEPENDENT_AMBULATORY_CARE_PROVIDER_SITE_OTHER): Payer: Medicare Other | Admitting: Internal Medicine

## 2017-04-02 ENCOUNTER — Encounter: Payer: Self-pay | Admitting: Internal Medicine

## 2017-04-02 VITALS — BP 122/76 | HR 74 | Ht 75.0 in | Wt 182.0 lb

## 2017-04-02 DIAGNOSIS — E782 Mixed hyperlipidemia: Secondary | ICD-10-CM | POA: Diagnosis not present

## 2017-04-02 DIAGNOSIS — I10 Essential (primary) hypertension: Secondary | ICD-10-CM | POA: Diagnosis not present

## 2017-04-02 DIAGNOSIS — I208 Other forms of angina pectoris: Secondary | ICD-10-CM | POA: Diagnosis not present

## 2017-04-02 DIAGNOSIS — I2089 Other forms of angina pectoris: Secondary | ICD-10-CM | POA: Insufficient documentation

## 2017-04-02 MED ORDER — ISOSORBIDE MONONITRATE ER 30 MG PO TB24: 30.0000 mg | ORAL_TABLET | Freq: Every day | ORAL | 3 refills | Status: DC

## 2017-04-02 NOTE — Progress Notes (Signed)
New Outpatient Visit Date: 04/02/2017  Referring Provider: [name], NP [address] [place], [place] [address]  Chief Complaint: Chest pain  HPI:  Brendan [name] is seen today for the evaluation of chest pain at the request of Dr. Jamesetta Orleans. The patient is a 65 y.o. year-old male with history of hypertension, hyperlipidemia, type 2 diabetes mellitus, and tobacco use. He reports 6-7 months of exertional chest pain and shortness of breath. He describes the pain as a dull ache with a maximal intensity of 3/10. It typically occurs 2-3 times per week, most often while at work. He will rest for 2-3 minutes, which time the pain resolves and he is able to carry on with his usual activities. The pain is substernal without radiation. He was previously seen by Dr. Gwen Pounds of [facility] where workup with echocardiogram and myocardial perfusion stress test were unrevealing. Prior lung cancer screening chest CT was notable for coronary artery calcification.  The patient reports chronic exertional dyspnea that he attributes to bronchitis. He has a long history of tobacco use and continues to smoke (though he is trying to cut down). He notes occasional ankle edema and orthostatic lightheadedness. He denies palpitations, orthopnea, PND, and claudication. He reports being on atenolol in the past, though he did not like this medication due to associated fatigue.  --------------------------------------------------------------------------------------------------  Cardiovascular History & Procedures: Cardiovascular Problems:  Stable angina  Risk Factors:  Hypertension, hyperlipidemia, diabetes mellitus, tobacco use, male gender, age greater than 47  Cath/PCI:  None  CV Surgery:  None  EP Procedures and Devices:  None  Non-Invasive Evaluation(s):  TTE (06/28/16, [facility]): Normal LV size and function (EF 55%). Grade 1 diastolic dysfunction. Normal right ventricular function. Mild tricuspid  and mitral regurgitation. No valvular stenosis.  Pharmacologic myocardial perfusion stress test (06/28/16, [facility]): Homogeneous tracer uptake without ischemia or scar. LVEF 58%.  Recent CV Pertinent Labs: Lab Results  Component Value Date   CHOL 110 03/19/2017   CHOL 111 02/29/2016   HDL 36 (L) 03/19/2017   LDLCALC 54 03/19/2017   TRIG 98 03/19/2017   TRIG 73 02/29/2016   K 3.7 03/19/2017   BUN 14 03/19/2017   CREATININE 0.92 03/19/2017    --------------------------------------------------------------------------------------------------  Past Medical History:  Diagnosis Date  . Diabetes mellitus without complication (HCC)   . Hyperlipidemia   . Hypertension   . Personal history of tobacco use, presenting hazards to health 05/03/2016    Past Surgical History:  Procedure Laterality Date  . BACK SURGERY    . KNEE SURGERY      Outpatient Encounter Prescriptions as of 04/02/2017  Medication Sig  . albuterol (PROVENTIL HFA;VENTOLIN HFA) 108 (90 Base) MCG/ACT inhaler Inhale 2 puffs into the lungs every 6 (six) hours as needed for wheezing or shortness of breath.  [name] amLODipine (NORVASC) 10 MG tablet Take 1 tablet (10 mg total) by mouth daily.  [name] aspirin 81 MG tablet Take 81 mg by mouth daily.  [name] atorvastatin (LIPITOR) 40 MG tablet Take 1 tablet (40 mg total) by mouth daily.  [name] azelastine (ASTELIN) 0.1 % nasal spray Place 2 sprays into both nostrils every 12 (twelve) hours.  [name] azithromycin (ZITHROMAX) 250 MG tablet Take 2 tablets day one, then 1 tablet daily  . lisinopril (PRINIVIL,ZESTRIL) 40 MG tablet Take 1 tablet (40 mg total) by mouth daily.  . metFORMIN (GLUCOPHAGE) 500 MG tablet Take 1 tablet (500 mg total) by mouth 2 (two) times daily with a meal.  . montelukast (SINGULAIR)  10 MG tablet Take 10 mg by mouth daily.  . predniSONE (DELTASONE) 10 MG tablet Take 6 tablets day one, then 5 tablets day 2, then 4 tablets day 3, etc.  . tamsulosin (FLOMAX) 0.4 MG CAPS capsule  Take 1 capsule (0.4 mg total) by mouth daily.   No facility-administered encounter medications on file as of 04/02/2017.     Allergies: Patient has no known allergies.  Social History   Social History  . Marital status: Divorced    Spouse name: N/A  . Number of children: N/A  . Years of education: N/A   Occupational History  . Not on file.   Social History Main Topics  . Smoking status: Current Every Day Smoker    Packs/day: 1.00    Years: 40.00    Types: Cigarettes  . Smokeless tobacco: Never Used  . Alcohol use No  . Drug use: No  . Sexual activity: Yes   Other Topics Concern  . Not on file   Social History Narrative  . No narrative on file    Family History  Problem Relation Age of Onset  . Hypertension Mother   . Diabetes Mother   . Heart disease Father     MI    Review of Systems: Chronic right knee pain following remote surgery. Patient also notes occasional tingling and burning in his feet. Otherwise, a 12-system review of systems was performed and was negative except as noted in the HPI.  --------------------------------------------------------------------------------------------------  Physical Exam: BP 122/76 (BP Location: Right Arm, Patient Position: Sitting, Cuff Size: Normal)   Pulse 74   Ht  (1.905 m)   Wt 182 lb (82.6 kg)   BMI 22.75 kg/m   General:  Well-developed, well-nourished man seated comfortably in the exam room. He smells of tobacco smoke. HEENT: No conjunctival pallor or scleral icterus.  Moist mucous membranes.  OP clear. Neck: Supple without lymphadenopathy, thyromegaly, JVD, or HJR.  No carotid bruit. Lungs: Normal work of breathing.  Clear to auscultation bilaterally without wheezes or crackles. Heart: Regular rate and rhythm without murmurs, rubs, or gallops.  Non-displaced PMI. Abd: Bowel sounds present.  Soft, NT/ND without hepatosplenomegaly Ext: No lower extremity edema.  Radial, PT, and DP pulses are 2+  bilaterally Skin: warm and dry without rash Neuro: CNIII-XII intact.  Strength and fine-touch sensation intact in upper and lower extremities bilaterally. Psych: Normal mood and affect.  EKG:  Normal sinus rhythm without significant abnormalities or changes from prior tracing on 03/19/17 (I have personally reviewed both tracings).  Lab Results  Component Value Date   WBC 9.9 06/05/2016   HCT 48.2 06/05/2016   MCV 91 06/05/2016   PLT 199 06/05/2016    Lab Results  Component Value Date   NA 146 (H) 03/19/2017   K 3.7 03/19/2017   CL 103 03/19/2017   CO2 27 03/19/2017   BUN 14 03/19/2017   CREATININE 0.92 03/19/2017   GLUCOSE 92 03/19/2017   ALT 14 03/19/2017    Lab Results  Component Value Date   CHOL 110 03/19/2017   HDL 36 (L) 03/19/2017   LDLCALC 54 03/19/2017   TRIG 98 03/19/2017    --------------------------------------------------------------------------------------------------  ASSESSMENT AND PLAN: Stable angina Patient's symptoms are consistent with stable angina that has been present for at least 6-7 months. Of note, previous cardiac workup by Dr. Gwen Pounds revealed negative pharmacologic myocardial perfusion stress test and transthoracic echocardiogram. The patient has multiple cardiac risk factors, including diabetes, hypertension, hyperlipidemia, ongoing tobacco use,  male gender, and age. We have agreed to a trial of medical therapy with isosorbide mononitrate 30 mg daily and addition to his current regimen. I will hold off on adding a beta blocker, given intolerance to atenolol in the past. We will continue with aspirin 81 mg daily and atorvastatin 40 mg daily. Patient did not wish to have a prescription for sublingual nitroglycerin at this time. If his pain worsens or does not respond to long-acting nitrates, we may need to consider further ischemia evaluation (likely with cardiac catheterization).  Essential hypertension Blood pressure is well controlled today. We  will add isosorbide mononitrate today for anti-anginal therapy. No changes to the remainder of his medications.  Hyperlipidemia Most recent LDL last month was excellent at 54. Mr. Martis should continue with high intensity statin therapy.  Follow-up: Return to clinic in 1 month.  Yvonne Kendall, MD 04/02/2017 8:26 PM

## 2017-04-02 NOTE — Patient Instructions (Signed)
Medication Instructions:  Your physician has recommended you make the following change in your medication:  1- START Isosorbide (Imdur) 30 mg  (1 tablet) by mouth once a day.   Labwork: NONE  Testing/Procedures: NONE  Follow-Up: Your physician recommends that you schedule a follow-up appointment in: 1 MONTH WITH DR END.   If you need a refill on your cardiac medications before your next appointment, please call your pharmacy.

## 2017-04-03 ENCOUNTER — Other Ambulatory Visit: Payer: Self-pay | Admitting: Unknown Physician Specialty

## 2017-04-29 ENCOUNTER — Telehealth: Payer: Self-pay | Admitting: *Deleted

## 2017-04-29 ENCOUNTER — Telehealth: Payer: Self-pay

## 2017-04-29 DIAGNOSIS — Z87891 Personal history of nicotine dependence: Secondary | ICD-10-CM

## 2017-04-29 NOTE — Telephone Encounter (Signed)
Notified patient that annual lung cancer screening low dose CT scan is due. Confirmed that patient is within the age range of 55-77, and asymptomatic, (no signs or symptoms of lung cancer). Patient denies illness that would prevent curative treatment for lung cancer if found. The patient is a current smoker, with a 41 pack year history. The shared decision making visit was done 05/04/16. Patient is agreeable for CT scan being scheduled.

## 2017-04-29 NOTE — Telephone Encounter (Signed)
Left voicemail for patient notifyng them that it is time to schedule annual low dose lung cancer screening CT scan. Instructed patient to call back to verify information prior to the scan being scheduled.  

## 2017-04-29 NOTE — Telephone Encounter (Signed)
[facility]  Arrive at 12:45 on 05/07/17  [phone] to r/s  Spoke with patient

## 2017-05-07 ENCOUNTER — Ambulatory Visit (INDEPENDENT_AMBULATORY_CARE_PROVIDER_SITE_OTHER): Payer: Medicare Other | Admitting: Internal Medicine

## 2017-05-07 ENCOUNTER — Ambulatory Visit
Admission: RE | Admit: 2017-05-07 | Discharge: 2017-05-07 | Disposition: A | Discharge status: Home / Self Care | Payer: Medicare Other | Source: Ambulatory Visit | Attending: Oncology | Admitting: Oncology

## 2017-05-07 ENCOUNTER — Encounter: Payer: Self-pay | Admitting: Internal Medicine

## 2017-05-07 VITALS — BP 130/74 | HR 65 | Ht 73.0 in | Wt 183.0 lb

## 2017-05-07 DIAGNOSIS — I10 Essential (primary) hypertension: Secondary | ICD-10-CM

## 2017-05-07 DIAGNOSIS — I700 Atherosclerosis of aorta: Secondary | ICD-10-CM | POA: Insufficient documentation

## 2017-05-07 DIAGNOSIS — I2510 Atherosclerotic heart disease of native coronary artery without angina pectoris: Secondary | ICD-10-CM | POA: Diagnosis not present

## 2017-05-07 DIAGNOSIS — F17200 Nicotine dependence, unspecified, uncomplicated: Secondary | ICD-10-CM | POA: Diagnosis not present

## 2017-05-07 DIAGNOSIS — I25118 Atherosclerotic heart disease of native coronary artery with other forms of angina pectoris: Secondary | ICD-10-CM | POA: Diagnosis not present

## 2017-05-07 DIAGNOSIS — E785 Hyperlipidemia, unspecified: Secondary | ICD-10-CM

## 2017-05-07 DIAGNOSIS — Z87891 Personal history of nicotine dependence: Secondary | ICD-10-CM

## 2017-05-07 MED ORDER — RANOLAZINE ER 500 MG PO TB12: 500.0000 mg | ORAL_TABLET | Freq: Two times a day (BID) | ORAL | 3 refills | Status: DC

## 2017-05-07 NOTE — Patient Instructions (Addendum)
Medication Instructions:  Your physician has recommended you make the following change in your medication:  1- START taking Ranexa 500 mg (1 tablet) by mouth two times a day.   Labwork: NONE  Testing/Procedures: NONE  Follow-Up: Your physician recommends that you schedule a follow-up appointment in:  1 MONTH WITH DR END.  If you need a refill on your cardiac medications before your next appointment, please call your pharmacy.   Angiogram An angiogram, also called angiography, is a procedure used to look at the blood vessels. In this procedure, dye is injected through a long, thin tube (catheter) into an artery. X-rays are then taken. The X-rays will show if there is a blockage or problem in a blood vessel. Tell a health care provider about:  Any allergies you have, including allergies to shellfish or contrast dye.  All medicines you are taking, including vitamins, herbs, eye drops, creams, and over-the-counter medicines.  Any problems you or family members have had with anesthetic medicines.  Any blood disorders you have.  Any surgeries you have had.  Any previous kidney problems or failure you have had.  Any medical conditions you have.  Possibility of pregnancy, if this applies. What are the risks? Generally, an angiogram is a safe procedure. However, as with any procedure, problems can occur. Possible problems include:  Injury to the blood vessels, including rupture or bleeding.  Infection or bruising at the catheter site.  Allergic reaction to the dye or contrast used.  Kidney damage from the dye or contrast used.  Blood clots that can lead to a stroke or heart attack. What happens before the procedure?  Do not eat or drink after midnight on the night before the procedure, or as directed by your health care provider.  Ask your health care provider if you may drink enough water to take any needed medicines the morning of the procedure. What happens during the  procedure?  You may be given a medicine to help you relax (sedative) before and during the procedure. This medicine is given through an IV access tube that is inserted into one of your veins.  The area where the catheter will be inserted will be washed and shaved. This is usually done in the groin but may be done in the fold of your arm (near your elbow) or in the wrist.  A medicine will be given to numb the area where the catheter will be inserted (local anesthetic).  The catheter will be inserted with a guide wire into an artery. The catheter is guided by using a type of X-ray (fluoroscopy) to the blood vessel being examined.  Dye is then injected into the catheter, and X-rays are taken. The dye helps to show where any narrowing or blockages are located. What happens after the procedure?  If the procedure is done through the leg, you will be kept in bed lying flat for several hours. You will be instructed to not bend or cross your legs.  The insertion site will be checked frequently.  The pulse in your feet or wrist will be checked frequently.  Additional blood tests, X-rays, and electrocardiography may be done.  You may need to stay in the hospital overnight for observation. This information is not intended to replace advice given to you by your health care provider. Make sure you discuss any questions you have with your health care provider. Document Released: 09/26/2005 Document Revised: 05/30/2016 Document Reviewed: 05/20/2013 Elsevier Interactive Patient Education  2017 [employer]  Coronary Angiogram With Stent Coronary angiogram with stent placement is a procedure to widen or open a narrow blood vessel of the heart (coronary artery). Arteries may become blocked by cholesterol buildup (plaques) in the lining or wall. When a coronary artery becomes partially blocked, blood flow to that area decreases. This may lead to chest pain or a heart attack (myocardial infarction). A  stent is a small piece of metal that looks like mesh or a spring. Stent placement may be done as treatment for a heart attack or right after a coronary angiogram in which a blocked artery is found. Let your health care provider know about:  Any allergies you have.  All medicines you are taking, including vitamins, herbs, eye drops, creams, and over-the-counter medicines.  Any problems you or family members have had with anesthetic medicines.  Any blood disorders you have.  Any surgeries you have had.  Any medical conditions you have.  Whether you are pregnant or may be pregnant. What are the risks? Generally, this is a safe procedure. However, problems may occur, including:  Damage to the heart or its blood vessels.  A return of blockage.  Bleeding, infection, or bruising at the insertion site.  A collection of blood under the skin (hematoma) at the insertion site.  A blood clot in another part of the body.  Kidney injury.  Allergic reaction to the dye or contrast that is used.  Bleeding into the abdomen (retroperitoneal bleeding). What happens before the procedure? Staying hydrated  Follow instructions from your health care provider about hydration, which may include:  Up to 2 hours before the procedure - you may continue to drink clear liquids, such as water, clear fruit juice, black coffee, and plain tea. Eating and drinking restrictions  Follow instructions from your health care provider about eating and drinking, which may include:  8 hours before the procedure - stop eating heavy meals or foods such as meat, fried foods, or fatty foods.  6 hours before the procedure - stop eating light meals or foods, such as toast or cereal.  2 hours before the procedure - stop drinking clear liquids. Ask your health care provider about:  Changing or stopping your regular medicines. This is especially important if you are taking diabetes medicines or blood thinners.  Taking  medicines such as ibuprofen. These medicines can thin your blood. Do not take these medicines before your procedure if your health care provider instructs you not to. Generally, aspirin is recommended before a procedure of passing a small, thin tube (catheter) through a blood vessel and into the heart (cardiac catheterization). What happens during the procedure?  An IV tube will be inserted into one of your veins.  You will be given one or more of the following:  A medicine to help you relax (sedative).  A medicine to numb the area where the catheter will be inserted into an artery (local anesthetic).  To reduce your risk of infection:  Your health care team will wash or sanitize their hands.  Your skin will be washed with soap.  Hair may be removed from the area where the catheter will be inserted.  Using a guide wire, the catheter will be inserted into an artery. The location may be in your groin, in your wrist, or in the fold of your arm (near your elbow).  A type of X-ray (fluoroscopy) will be used to help guide the catheter to the opening of the arteries in the heart.  A dye  will be injected into the catheter, and X-rays will be taken. The dye will help to show where any narrowing or blockages are located in the arteries.  A tiny wire will be guided to the blocked spot, and a balloon will be inflated to make the artery wider.  The stent will be expanded and will crush the plaques into the wall of the vessel. The stent will hold the area open and improve the blood flow. Most stents have a drug coating to reduce the risk of the stent narrowing over time.  The artery may be made wider using a drill, laser, or other tools to remove plaques.  When the blood flow is better, the catheter will be removed. The lining of the artery will grow over the stent, which stays where it was placed. This procedure may vary among health care providers and hospitals. What happens after the  procedure?  If the procedure is done through the leg, you will be kept in bed lying flat for about 6 hours. You will be instructed to not bend and not cross your legs.  The insertion site will be checked frequently.  The pulse in your foot or wrist will be checked frequently.  You may have additional blood tests, X-rays, and a test that records the electrical activity of your heart (electrocardiogram, or ECG). This information is not intended to replace advice given to you by your health care provider. Make sure you discuss any questions you have with your health care provider. Document Released: 06/23/2003 Document Revised: 08/16/2016 Document Reviewed: 07/22/2016 Elsevier Interactive Patient Education  2017 [employer].   Medication Samples have been provided to the patient.  Drug name: RANEXA       Strength: 500MG         Qty: 4 BOXES(28 TABLETS)  LOT: [id number]  Exp.Date: 09/2019

## 2017-05-07 NOTE — Progress Notes (Signed)
Follow-up Outpatient Visit Date: 05/07/2017  Primary Care Provider: Gabriel CirriWicker, Cheryl, NP [address] [place] [id number]  Chief Complaint: Chest pain  HPI:  [name] is a 66 y.o. year-old male with history of hypertension, hyperlipidemia, type 2 diabetes mellitus, and tobacco use, who presents for follow-up of chest pain. I last saw him on 04/01/17, at which time he reported a 6-7 month history of exertional chest pain and shortness of breath. Prior workup, including echo and myocardial perfusion stress test by Dr. Gwen PoundsKowalski in 05/2016, was unrevealing. We agreed to add isosorbide mononitrate 30 mg daily, which precipitated fatigue. [name] cut the dose down to 15 mg daily, which he has been tolerating well. He notes improvement but in his exertional chest pain. It no longer occurs on a daily basis, though he still notes some chest tightness with dyspnea when pulling heavy objects or using a push mower for extended periods. The symptoms resolve with rest after ~10 minutes. He denies palpitations, lightheadedness, orthopnea, PND, and edema. He has not had any bleeding or falls. He is planning to retire later this month, though he would like to continue working some.[name].  --------------------------------------------------------------------------------------------------  Cardiovascular History & Procedures: Cardiovascular Problems:  Stable angina  Risk Factors:  Hypertension, hyperlipidemia, diabetes mellitus, tobacco use, male gender, age greater than 7055  Cath/PCI:  None  CV Surgery:  None  EP Procedures and Devices:  None  Non-Invasive Evaluation(s):  TTE (06/28/16, [facility]): Normal LV size and function (EF 55%). Grade 1 diastolic dysfunction. Normal right ventricular function. Mild tricuspid and mitral regurgitation. No valvular stenosis.  Pharmacologic myocardial perfusion stress test [facility] Homogeneous tracer uptake without ischemia or scar. LVEF  58%.  Recent CV Pertinent Labs: Lab Results  Component Value Date   CHOL 110 03/19/2017   CHOL 111 02/29/2016   HDL 36 (L) 03/19/2017   LDLCALC 54 03/19/2017   TRIG 98 03/19/2017   TRIG 73 02/29/2016   K 3.7 03/19/2017   BUN 14 03/19/2017   CREATININE 0.92 03/19/2017    Past medical and surgical history were reviewed and updated in EPIC.  Outpatient Encounter Prescriptions as of 05/07/2017  Medication Sig  . albuterol (PROVENTIL HFA;VENTOLIN HFA) 108 (90 Base) MCG/ACT inhaler Inhale 2 puffs into the lungs every 6 (six) hours as needed for wheezing or shortness of breath.  [name]. amLODipine (NORVASC) 10 MG tablet Take 1 tablet (10 mg total) by mouth daily.  [name]. aspirin 81 MG tablet Take 81 mg by mouth daily.  [name]. atorvastatin (LIPITOR) 40 MG tablet Take 1 tablet (40 mg total) by mouth daily.  [name]. azelastine (ASTELIN) 0.1 % nasal spray Place 2 sprays into both nostrils every 12 (twelve) hours.  . isosorbide mononitrate (IMDUR) 30 MG 24 hr tablet Take 1 tablet (30 mg total) by mouth daily.  [name]. lisinopril (PRINIVIL,ZESTRIL) 40 MG tablet Take 1 tablet (40 mg total) by mouth daily.  . metFORMIN (GLUCOPHAGE) 500 MG tablet Take 1 tablet (500 mg total) by mouth 2 (two) times daily with a meal.  . montelukast (SINGULAIR) 10 MG tablet Take 10 mg by mouth daily.  . tamsulosin (FLOMAX) 0.4 MG CAPS capsule Take 1 capsule (0.4 mg total) by mouth daily.  . [DISCONTINUED] azithromycin (ZITHROMAX) 250 MG tablet Take 2 tablets day one, then 1 tablet daily (Patient not taking: Reported on 05/07/2017)  . [DISCONTINUED] predniSONE (DELTASONE) 10 MG tablet Take 6 tablets day one, then 5 tablets day 2, then 4 tablets day 3, etc. (Patient not  taking: Reported on 05/07/2017)   No facility-administered encounter medications on file as of 05/07/2017.     Allergies: Patient has no known allergies.  Social History   Social History  . Marital status: Divorced    Spouse name: N/A  . Number of children: N/A  . Years of  education: N/A   Occupational History  . Not on file.   Social History Main Topics  . Smoking status: Current Every Day Smoker    Packs/day: 1.00    Years: 40.00    Types: Cigarettes  . Smokeless tobacco: Never Used  . Alcohol use No  . Drug use: No  . Sexual activity: Yes   Other Topics Concern  . Not on file   Social History Narrative  . No narrative on file    Family History  Problem Relation Age of Onset  . Hypertension Mother   . Diabetes Mother   . Heart disease Father 71    MI    Review of Systems: A 12-system review of systems was performed and was negative except as noted in the HPI.  --------------------------------------------------------------------------------------------------  Physical Exam: BP 130/74 (BP Location: Left Arm, Patient Position: Sitting, Cuff Size: Normal)   Pulse 65   Ht 6\' 1"  (1.854 m)   Wt 183 lb (83 kg)   BMI 24.14 kg/m   General:  Well-developed, well-nourished man, seated comfortably in the exam room. HEENT: No conjunctival pallor or scleral icterus.  Moist mucous membranes.  OP clear. Neck: Supple without lymphadenopathy, thyromegaly, JVD, or HJR. Lungs: Normal work of breathing.  Clear to auscultation bilaterally without wheezes or crackles. Heart: Regular rate and rhythm without murmurs, rubs, or gallops.  Non-displaced PMI. Abd: Bowel sounds present.  Soft, NT/ND without hepatosplenomegaly Ext: No lower extremity edema.  Radial, PT, and DP pulses are 2+ bilaterally. Skin: warm and dry without rash  Lab Results  Component Value Date   WBC 9.9 06/05/2016   HCT 48.2 06/05/2016   MCV 91 06/05/2016   PLT 199 06/05/2016    Lab Results  Component Value Date   NA 146 (H) 03/19/2017   K 3.7 03/19/2017   CL 103 03/19/2017   CO2 27 03/19/2017   BUN 14 03/19/2017   CREATININE 0.92 03/19/2017   GLUCOSE 92 03/19/2017   ALT 14 03/19/2017    Lab Results  Component Value Date   CHOL 110 03/19/2017   HDL 36 (L) 03/19/2017    LDLCALC 54 03/19/2017   TRIG 98 03/19/2017    --------------------------------------------------------------------------------------------------  ASSESSMENT AND PLAN: Coronary artery disease with stable angina Chest pain has improved with addition of isosorbide mononitrate, though Brendan [name] is only able to tolerate 15 mg daily due to fatigue with higher doses. He remains on amlodipine. We discussed further characterization of his coronary anatomy with left heart catheterization versus continued medical therapy. Though his stress test at [facility] last year was normal, I am concerned about the possibility of a false negative study, given Brendan [name]' typical symptoms and multiple cardiac risk factors. I recommended that we proceed with left heart catheterization. Brendan [name] is receptive to diagnostic coronary angiography but is not sure that he would like to undergo stent placement or bypass surgery. He would like to think about these procedures more before proceeding with left heart catheterization. In the meantime, we have agreed to add ranolazine 500 mg BID. I will hold off on adding a beta blocker at this time, given resting heart rate in the 60's and history  of obstructive lung disease.  Hypertension Blood pressure is borderline elevated today (goal < 130/80). I encouraged Brendan [name] to continue his current medications.  Dyslipidemia Recent lipid panel is notable for an LDL 54 and HDL 36. We will continue with current dose of atorvastatin.  Tobacco use Brendan [name] continues to smoke. Smoking cessation counseling was provided, though he remains in the contemplative stage.  Follow-up: Return to clinic in 1 month with Ward Givens, NP, to reassess angina and discuss proceeding with left heart catheterization.  Yvonne Kendall, MD 05/08/2017 5:42 PM

## 2017-05-14 ENCOUNTER — Encounter: Payer: Self-pay | Admitting: *Deleted

## 2017-05-28 ENCOUNTER — Other Ambulatory Visit: Payer: Self-pay

## 2017-05-28 MED ORDER — AMLODIPINE BESYLATE 10 MG PO TABS: 10.0000 mg | ORAL_TABLET | Freq: Every day | ORAL | 1 refills | Status: DC

## 2017-05-30 ENCOUNTER — Encounter: Payer: Self-pay | Admitting: Nurse Practitioner

## 2017-05-30 ENCOUNTER — Ambulatory Visit (INDEPENDENT_AMBULATORY_CARE_PROVIDER_SITE_OTHER): Payer: Medicare Other | Admitting: Nurse Practitioner

## 2017-05-30 ENCOUNTER — Other Ambulatory Visit: Payer: Self-pay | Admitting: Nurse Practitioner

## 2017-05-30 VITALS — BP 138/80 | HR 68 | Ht 73.5 in | Wt 181.2 lb

## 2017-05-30 DIAGNOSIS — I10 Essential (primary) hypertension: Secondary | ICD-10-CM

## 2017-05-30 DIAGNOSIS — I208 Other forms of angina pectoris: Secondary | ICD-10-CM

## 2017-05-30 DIAGNOSIS — I209 Angina pectoris, unspecified: Secondary | ICD-10-CM

## 2017-05-30 DIAGNOSIS — Z720 Tobacco use: Secondary | ICD-10-CM

## 2017-05-30 DIAGNOSIS — E782 Mixed hyperlipidemia: Secondary | ICD-10-CM | POA: Diagnosis not present

## 2017-05-30 DIAGNOSIS — R072 Precordial pain: Secondary | ICD-10-CM | POA: Diagnosis not present

## 2017-05-30 MED ORDER — NITROGLYCERIN 0.4 MG SL SUBL: 0.4000 mg | SUBLINGUAL_TABLET | SUBLINGUAL | 3 refills | Status: DC | PRN

## 2017-05-30 NOTE — Progress Notes (Signed)
Cardiology Clinic Note   Patient Name: Brendan [name] Date of Encounter: 05/30/2017  Primary Care Provider:  [name], NP Primary Cardiologist:  Wallace Cullens, MD   Patient Profile    66 y/o ? with a h/o exertional c/p, HTN, HL, DM II, and tobacco abuse, who presents for f/u related to exertional chest pain.  Past Medical History    Past Medical History:  Diagnosis Date  . Chest pain    a. 05/2016 Lexi MV: EF 58%, no ischemia/infarct.  . Diabetes mellitus without complication (HCC)   . Diastolic dysfunction    a. 05/2016 Echo: EF 55%, Gr1 DD, mild TR/MR.  [name] Hyperlipidemia   . Hypertension   . Personal history of tobacco use, presenting hazards to health 05/03/2016   Past Surgical History:  Procedure Laterality Date  . BACK SURGERY    . KNEE SURGERY      Allergies  No Known Allergies  History of Present Illness    66 y/o ? with the above PMH including HTN, HL, DM II, and tobacco abuse.  He was previously evaluated by [facility] in 05/2016 in the setting of exertional chest pain.  Echo showed nl EF with grade 1 diastolic dysfxn, while stress testing was negative.  He continued to have intermittent exertional chest pain and switched his care to Dr. Okey Dupre.  In early April, he was placed on imdur therapy, which caused headaches and fatigue, and this was reduced to 1/2 tab daily, which he has since tolerated.  When he was last seen in early May, symptoms continued to occur a few x/wk and ranexa was added.  He was offered diagnostic catheterization but wanted to think about it.  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  He doesn't usually have to stop doing what he is doing.  Since his last visit, he has spoken with his dtr about the possibility of catheterization and he is now wishing to proceed.  I have spoken to him  and his dtr today re: risks and benefits.  Home Medications    Prior to Admission medications   Medication Sig Start Date End Date Taking? Authorizing Provider  albuterol (PROVENTIL HFA;VENTOLIN HFA) 108 (90 Base) MCG/ACT inhaler Inhale 2 puffs into the lungs every 6 (six) hours as needed for wheezing or shortness of breath. 09/17/16  Yes [name], NP  amLODipine (NORVASC) 10 MG tablet Take 1 tablet (10 mg total) by mouth daily. 05/28/17  Yes Johnson, Megan P, DO  aspirin 81 MG tablet Take 81 mg by mouth daily.   Yes [provider]  atorvastatin (LIPITOR) 40 MG tablet Take 1 tablet (40 mg total) by mouth daily. 09/17/16  Yes [name], NP  azelastine (ASTELIN) 0.1 % nasal spray Place 2 sprays into both nostrils every 12 (twelve) hours. 02/15/17  Yes [provider]  isosorbide mononitrate (IMDUR) 30 MG 24 hr tablet Take 1 tablet (30 mg total) by mouth daily. Patient taking differently: Take 15 mg by mouth daily.  04/02/17 07/01/17 Yes End, Cristal Deer, MD  lisinopril (PRINIVIL,ZESTRIL) 40 MG tablet Take 1 tablet (40 mg total) by mouth daily. 02/14/17  Yes [name], NP  metFORMIN (GLUCOPHAGE) 500 MG tablet Take 1 tablet (500 mg total) by mouth 2 (two) times daily with a meal. 04/03/17  Yes [name], NP  montelukast (  SINGULAIR) 10 MG tablet Take 10 mg by mouth daily. 02/13/17  Yes [provider]  ranolazine (RANEXA) 500 MG 12 hr tablet Take 1 tablet (500 mg total) by mouth 2 (two) times daily. 05/07/17  Yes End, Cristal Deerhristopher, MD  tamsulosin (FLOMAX) 0.4 MG CAPS capsule Take 1 capsule (0.4 mg total) by mouth daily. 02/22/17  Yes Gabriel CirriWicker, Cheryl, NP  nitroGLYCERIN (NITROSTAT) 0.4 MG SL tablet Place 1 tablet (0.4 mg total) under the tongue every 5 (five) minutes as needed. 05/30/17   Ok AnisBerge, Lind Ausley R, NP    Family History    Family History  Problem Relation Age of Onset  . Hypertension Mother   . Diabetes Mother   . Heart disease Father [id number]       multiple  MI's    Social History    Social History   Social History  . Marital status: Divorced    Spouse name: N/A  . Number of children: N/A  . Years of education: N/A   Occupational History  . Not on file.   Social History Main Topics  . Smoking status: Current Every Day Smoker    Packs/day: 1.50    Years: 60.00    Types: Cigarettes  . Smokeless tobacco: Never Used  . Alcohol use No     Comment: previously drank heavily but quit ~ 20 yrs ago.  . Drug use: No  . Sexual activity: Yes   Other Topics Concern  . Not on file   Social History Narrative   [place].  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  Does not routinely exercise.     Review of Systems    General:  No chills, fever, night sweats or weight changes.  Cardiovascular:  +++ ex chest pain and dyspnea on exertion, no edema, orthopnea, palpitations, paroxysmal nocturnal dyspnea. Dermatological: No rash, lesions/masses Respiratory: No cough, dyspnea Urologic: No hematuria, dysuria Abdominal:   No nausea, vomiting, diarrhea, bright red blood per rectum, melena, or hematemesis Neurologic:  No visual changes, wkns, changes in mental status. All other systems reviewed and are otherwise negative except as noted above.  Physical Exam    VS:  BP 138/80 (BP Location: Left Arm, Patient Position: Sitting, Cuff Size: Normal)   Pulse 68   Ht 6' 1.5" (1.867 m)   Wt 181 lb 4 oz (82.2 kg)   BMI 23.59 kg/m  , BMI Body mass index is 23.59 kg/m. GEN: Well nourished, well developed, in no acute distress.  HEENT: normal.  Neck: Supple, no JVD, carotid bruits, or masses. Cardiac: RRR, no murmurs, rubs, or gallops. No clubbing, cyanosis, edema.  Radials/DP/PT 2+ and equal bilaterally.  Respiratory:  Respirations regular and unlabored, clear to auscultation bilaterally. GI: Soft, nontender, nondistended, BS + x 4. MS: no deformity or atrophy. Skin: warm and dry, no rash. Neuro:  Strength and sensation are intact. Psych: Normal  affect.  Accessory Clinical Findings    ECG - RSR, 62, no acute st/t changes.  Assessment & Plan   1.  Stable Angina/CAD:  Pt with a year long h/o exertional c/p with neg MV in 05/2016.  Ss have improved some with the addition of low dose oral nitrate therapy (only tolerates 15 mg of imdur) and ranexa.  He still has ex c/p w/ dyspnea ~ 2x/wk. Ss typically last 10 mins and resolve with slowing of pace.  He has talked with his dtr re: pursuing diagnostic catheterization and he is now interested.  The patient understands that risks include  but are not limited to stroke (1 in 1000), death (1 in 1000), kidney failure [usually temporary] (1 in 500), bleeding (1 in 200), allergic reaction [possibly serious] (1 in 200), and agrees to proceed.  Cont asa, statin, nitrate, and ranexa.  Will plan on cath next week with Dr. Okey Dupre.  Labs today.  I will send in Rx for sl ntg, though he says he doesn't think he will fill it b/c he's scared it will cause him to explode.  2.  Essential HTN:  BP mildly elevated today.  Cont current meds including acei, ccb, and imdur.  We can reassess regimen post-cath and consider adding chlorthalidone if pressures remain elevated.  3.  HL:  On lipitor therapy.  LDL was 54 on March 20th.  4.  Tob Abuse:  Still smoking, though has cut down from 1.5 ppd to 1 ppd.  Complete cessation advised.  5.  DM II:  On metformin.  A1c was 5.9 last Sept.  Per IM.  He will hold metformin prior to cath next week.  6.  Disposition:  Labs today. Cath next week with Dr. Okey Dupre.  F/u in clinic ~ 2 wks post-cath.  Nicolasa Ducking, NP 05/30/2017, 2:53 PM

## 2017-05-30 NOTE — Patient Instructions (Addendum)
Medication Instructions:  Your physician has recommended you make the following change in your medication:  1. Nitroglycerin 0.4 mg under the tongue as needed for chest pain. You may repeat 1 tablet every 5 minutes up to a total of 3 if having chest pain. Go to emergency room if pain persists or worsens.    Labwork: Labs today for your upcoming procedure. We will call you with these results.   Testing/Procedures: [facility] Cardiac Cath Instructions   You are scheduled for a Cardiac Cath on:_Tuesday June 5, 2018__  Please arrive at _08:00_am on the day of your procedure  Please expect a call from our [facility] to pre-register you  Do not eat/drink anything after midnight  Someone will need to drive you home  It is recommended someone be with you for the first 24 hours after your procedure  Wear clothes that are easy to get on/off and wear slip on shoes if possible   Medications bring a current list of all medications with you  _X__ Do not take Metformin (Glucophage) the day before or morning of procedure.   _X__ You may take all of other medications the morning of your procedure with enough water to swallow safely  Day of your procedure: Arrive at the [facility] entrance.  Free valet service is available.  After entering the [facility] please check-in at the registration desk (1st desk on your right) to receive your armband. After receiving your armband someone will escort you to the cardiac cath/special procedures waiting area.  The usual length of stay after your procedure is about 2 to 3 hours.  This can vary.  If you have any questions, please call our office at [phone], or you may call the cardiac cath lab at [facility] directly at [phone]   Follow-Up: Your physician recommends that you schedule a follow-up appointment in: 3 weeks with Dr. Okey Dupre or Ward Givens NP.   It was a pleasure seeing you today here in the office. Please do not hesitate to  give [place] a call back if you have any further questions. [phone]  Parowan Cellar RN, BSN     Coronary Angiogram With Stent Coronary angiogram with stent placement is a procedure to widen or open a narrow blood vessel of the heart (coronary artery). Arteries may become blocked by cholesterol buildup (plaques) in the lining or wall. When a coronary artery becomes partially blocked, blood flow to that area decreases. This may lead to chest pain or a heart attack (myocardial infarction). A stent is a small piece of metal that looks like mesh or a spring. Stent placement may be done as treatment for a heart attack or right after a coronary angiogram in which a blocked artery is found. Let your health care provider know about:  Any allergies you have.  All medicines you are taking, including vitamins, herbs, eye drops, creams, and over-the-counter medicines.  Any problems you or family members have had with anesthetic medicines.  Any blood disorders you have.  Any surgeries you have had.  Any medical conditions you have.  Whether you are pregnant or may be pregnant. What are the risks? Generally, this is a safe procedure. However, problems may occur, including:  Damage to the heart or its blood vessels.  A return of blockage.  Bleeding, infection, or bruising at the insertion site.  A collection of blood under the skin (hematoma) at the insertion site.  A blood clot in another part of the body.  Kidney injury.  Allergic reaction to the dye or contrast that is used.  Bleeding into the abdomen (retroperitoneal bleeding).  What happens before the procedure? Staying hydrated Follow instructions from your health care provider about hydration, which may include:  Up to 2 hours before the procedure - you may continue to drink clear liquids, such as water, clear fruit juice, black coffee, and plain tea.  Eating and drinking restrictions Follow instructions from your health care  provider about eating and drinking, which may include:  8 hours before the procedure - stop eating heavy meals or foods such as meat, fried foods, or fatty foods.  6 hours before the procedure - stop eating light meals or foods, such as toast or cereal.  2 hours before the procedure - stop drinking clear liquids.  Ask your health care provider about:  Changing or stopping your regular medicines. This is especially important if you are taking diabetes medicines or blood thinners.  Taking medicines such as ibuprofen. These medicines can thin your blood. Do not take these medicines before your procedure if your health care provider instructs you not to. Generally, aspirin is recommended before a procedure of passing a small, thin tube (catheter) through a blood vessel and into the heart (cardiac catheterization).  What happens during the procedure?  An IV tube will be inserted into one of your veins.  You will be given one or more of the following: ? A medicine to help you relax (sedative). ? A medicine to numb the area where the catheter will be inserted into an artery (local anesthetic).  To reduce your risk of infection: ? Your health care team will wash or sanitize their hands. ? Your skin will be washed with soap. ? Hair may be removed from the area where the catheter will be inserted.  Using a guide wire, the catheter will be inserted into an artery. The location may be in your groin, in your wrist, or in the fold of your arm (near your elbow).  A type of X-ray (fluoroscopy) will be used to help guide the catheter to the opening of the arteries in the heart.  A dye will be injected into the catheter, and X-rays will be taken. The dye will help to show where any narrowing or blockages are located in the arteries.  A tiny wire will be guided to the blocked spot, and a balloon will be inflated to make the artery wider.  The stent will be expanded and will crush the plaques into the  wall of the vessel. The stent will hold the area open and improve the blood flow. Most stents have a drug coating to reduce the risk of the stent narrowing over time.  The artery may be made wider using a drill, laser, or other tools to remove plaques.  When the blood flow is better, the catheter will be removed. The lining of the artery will grow over the stent, which stays where it was placed. This procedure may vary among health care providers and hospitals. What happens after the procedure?  If the procedure is done through the leg, you will be kept in bed lying flat for about 6 hours. You will be instructed to not bend and not cross your legs.  The insertion site will be checked frequently.  The pulse in your foot or wrist will be checked frequently.  You may have additional blood tests, X-rays, and a test that records the electrical activity of your heart (electrocardiogram, or  ECG). This information is not intended to replace advice given to you by your health care provider. Make sure you discuss any questions you have with your health care provider. Document Released: 06/23/2003 Document Revised: 08/16/2016 Document Reviewed: 07/22/2016 Elsevier Interactive Patient Education  2017 Elsevier Inc. Nitroglycerin sublingual tablets What is this medicine? NITROGLYCERIN (nye troe GLI ser in) is a type of vasodilator. It relaxes blood vessels, increasing the blood and oxygen supply to your heart. This medicine is used to relieve chest pain caused by angina. It is also used to prevent chest pain before activities like climbing stairs, going outdoors in cold weather, or sexual activity. This medicine may be used for other purposes; ask your health care provider or pharmacist if you have questions. COMMON BRAND NAME(S): Nitroquick, Nitrostat, Nitrotab What should I tell my health care provider before I take this medicine? They need to know if you have any of these conditions: -anemia -head  injury, recent stroke, or bleeding in the brain -liver disease -previous heart attack -an unusual or allergic reaction to nitroglycerin, other medicines, foods, dyes, or preservatives -pregnant or trying to get pregnant -breast-feeding How should I use this medicine? Take this medicine by mouth as needed. At the first sign of an angina attack (chest pain or tightness) place one tablet under your tongue. You can also take this medicine 5 to 10 minutes before an event likely to produce chest pain. Follow the directions on the prescription label. Let the tablet dissolve under the tongue. Do not swallow whole. Replace the dose if you accidentally swallow it. It will help if your mouth is not dry. Saliva around the tablet will help it to dissolve more quickly. Do not eat or drink, smoke or chew tobacco while a tablet is dissolving. If you are not better within 5 minutes after taking ONE dose of nitroglycerin, call 9-1-1 immediately to seek emergency medical care. Do not take more than 3 nitroglycerin tablets over 15 minutes. If you take this medicine often to relieve symptoms of angina, your doctor or health care professional may provide you with different instructions to manage your symptoms. If symptoms do not go away after following these instructions, it is important to call 9-1-1 immediately. Do not take more than 3 nitroglycerin tablets over 15 minutes. Talk to your pediatrician regarding the use of this medicine in children. Special care may be needed. Overdosage: If you think you have taken too much of this medicine contact a poison control center or emergency room at once. NOTE: This medicine is only for you. Do not share this medicine with others. What if I miss a dose? This does not apply. This medicine is only used as needed. What may interact with this medicine? Do not take this medicine with any of the following medications: -certain migraine medicines like ergotamine and dihydroergotamine  (DHE) -medicines used to treat erectile dysfunction like sildenafil, tadalafil, and vardenafil -riociguat This medicine may also interact with the following medications: -alteplase -aspirin -heparin -medicines for high blood pressure -medicines for mental depression -other medicines used to treat angina -phenothiazines like chlorpromazine, mesoridazine, prochlorperazine, thioridazine This list may not describe all possible interactions. Give your health care provider a list of all the medicines, herbs, non-prescription drugs, or dietary supplements you use. Also tell them if you smoke, drink alcohol, or use illegal drugs. Some items may interact with your medicine. What should I watch for while using this medicine? Tell your doctor or health care professional if you feel your medicine  is no longer working. Keep this medicine with you at all times. Sit or lie down when you take your medicine to prevent falling if you feel dizzy or faint after using it. Try to remain calm. This will help you to feel better faster. If you feel dizzy, take several deep breaths and lie down with your feet propped up, or bend forward with your head resting between your knees. You may get drowsy or dizzy. Do not drive, use machinery, or do anything that needs mental alertness until you know how this drug affects you. Do not stand or sit up quickly, especially if you are an older patient. This reduces the risk of dizzy or fainting spells. Alcohol can make you more drowsy and dizzy. Avoid alcoholic drinks. Do not treat yourself for coughs, colds, or pain while you are taking this medicine without asking your doctor or health care professional for advice. Some ingredients may increase your blood pressure. What side effects may I notice from receiving this medicine? Side effects that you should report to your doctor or health care professional as soon as possible: -blurred vision -dry mouth -skin rash -sweating -the  feeling of extreme pressure in the head -unusually weak or tired Side effects that usually do not require medical attention (report to your doctor or health care professional if they continue or are bothersome): -flushing of the face or neck -headache -irregular heartbeat, palpitations -nausea, vomiting This list may not describe all possible side effects. Call your doctor for medical advice about side effects. You may report side effects to FDA at 1-800-FDA-1088. Where should I keep my medicine? Keep out of the reach of children. Store at room temperature between 20 and 25 degrees C (68 and 77 degrees F). Store in Retail buyeroriginal container. Protect from light and moisture. Keep tightly closed. Throw away any unused medicine after the expiration date. NOTE: This sheet is a summary. It may not cover all possible information. If you have questions about this medicine, talk to your doctor, pharmacist, or health care provider.  2018 Elsevier/Gold Standard (2013-10-15 17:57:36)

## 2017-05-31 LAB — BASIC METABOLIC PANEL
BUN/Creatinine Ratio: 15 (ref 10–24)
BUN: 15 mg/dL (ref 8–27)
CALCIUM: 9.4 mg/dL (ref 8.6–10.2)
CHLORIDE: 106 mmol/L (ref 96–106)
CO2: 22 mmol/L (ref 18–29)
CREATININE: 1 mg/dL (ref 0.76–1.27)
GFR calc non Af Amer: 78 mL/min/{1.73_m2} (ref >59)
GFR, EST AFRICAN AMERICAN: 90 mL/min/{1.73_m2} (ref >59)
GLUCOSE: 94 mg/dL (ref 65–99)
Potassium: 4.2 mmol/L (ref 3.5–5.2)
Sodium: 144 mmol/L (ref 134–144)

## 2017-05-31 LAB — CBC WITH DIFFERENTIAL/PLATELET
BASOS: 0 %
Basophils Absolute: 0 10*3/uL (ref 0.0–0.2)
EOS (ABSOLUTE): 0.3 10*3/uL (ref 0.0–0.4)
EOS: 3 %
HEMOGLOBIN: 15.5 g/dL (ref 13.0–17.7)
Hematocrit: 44 % (ref 37.5–51.0)
IMMATURE GRANS (ABS): 0 10*3/uL (ref 0.0–0.1)
IMMATURE GRANULOCYTES: 0 %
LYMPHS: 29 %
Lymphocytes Absolute: 2.7 10*3/uL (ref 0.7–3.1)
MCH: 30.6 pg (ref 26.6–33.0)
MCHC: 35.2 g/dL (ref 31.5–35.7)
MCV: 87 fL (ref 79–97)
MONOCYTES: 6 %
Monocytes Absolute: 0.6 10*3/uL (ref 0.1–0.9)
NEUTROS ABS: 5.8 10*3/uL (ref 1.4–7.0)
NEUTROS PCT: 62 %
PLATELETS: 202 10*3/uL (ref 150–379)
RBC: 5.06 x10E6/uL (ref 4.14–5.80)
RDW: 15.1 % (ref 12.3–15.4)
WBC: 9.4 10*3/uL (ref 3.4–10.8)

## 2017-05-31 LAB — PROTIME-INR
INR: 1 (ref 0.8–1.2)
Prothrombin Time: 11 s (ref 9.1–12.0)

## 2017-06-03 ENCOUNTER — Telehealth: Payer: Self-pay | Admitting: *Deleted

## 2017-06-03 NOTE — Telephone Encounter (Signed)
Called and reminded patient about Heart cath arrival time of 0800 tomorrow 06/04/17 at [facility]. He verbalized understanding of preoperative instructions as we reviewed from his AVS.

## 2017-06-04 ENCOUNTER — Encounter
Admission: RE | Disposition: A | Discharge status: Home / Self Care | Payer: Self-pay | Source: Ambulatory Visit | Attending: Internal Medicine

## 2017-06-04 ENCOUNTER — Ambulatory Visit
Admission: RE | Admit: 2017-06-04 | Discharge: 2017-06-04 | Disposition: A | Discharge status: Home / Self Care | Payer: Medicare Other | Source: Ambulatory Visit | Attending: Internal Medicine | Admitting: Internal Medicine

## 2017-06-04 DIAGNOSIS — Z7984 Long term (current) use of oral hypoglycemic drugs: Secondary | ICD-10-CM | POA: Insufficient documentation

## 2017-06-04 DIAGNOSIS — I2089 Other forms of angina pectoris: Secondary | ICD-10-CM | POA: Diagnosis present

## 2017-06-04 DIAGNOSIS — I25118 Atherosclerotic heart disease of native coronary artery with other forms of angina pectoris: Secondary | ICD-10-CM | POA: Diagnosis not present

## 2017-06-04 DIAGNOSIS — E119 Type 2 diabetes mellitus without complications: Secondary | ICD-10-CM | POA: Diagnosis not present

## 2017-06-04 DIAGNOSIS — I2510 Atherosclerotic heart disease of native coronary artery without angina pectoris: Secondary | ICD-10-CM | POA: Diagnosis present

## 2017-06-04 DIAGNOSIS — E785 Hyperlipidemia, unspecified: Secondary | ICD-10-CM | POA: Diagnosis not present

## 2017-06-04 DIAGNOSIS — R079 Chest pain, unspecified: Secondary | ICD-10-CM

## 2017-06-04 DIAGNOSIS — Z8249 Family history of ischemic heart disease and other diseases of the circulatory system: Secondary | ICD-10-CM | POA: Diagnosis not present

## 2017-06-04 DIAGNOSIS — Z79899 Other long term (current) drug therapy: Secondary | ICD-10-CM | POA: Insufficient documentation

## 2017-06-04 DIAGNOSIS — Z7982 Long term (current) use of aspirin: Secondary | ICD-10-CM | POA: Insufficient documentation

## 2017-06-04 DIAGNOSIS — F17210 Nicotine dependence, cigarettes, uncomplicated: Secondary | ICD-10-CM | POA: Insufficient documentation

## 2017-06-04 DIAGNOSIS — I10 Essential (primary) hypertension: Secondary | ICD-10-CM | POA: Diagnosis not present

## 2017-06-04 DIAGNOSIS — I208 Other forms of angina pectoris: Secondary | ICD-10-CM | POA: Diagnosis present

## 2017-06-04 HISTORY — PX: LEFT HEART CATH AND CORONARY ANGIOGRAPHY: CATH118249

## 2017-06-04 LAB — GLUCOSE, CAPILLARY: Glucose-Capillary: 128 mg/dL — ABNORMAL HIGH (ref 65–99)

## 2017-06-04 SURGERY — LEFT HEART CATH AND CORONARY ANGIOGRAPHY
Anesthesia: Moderate Sedation

## 2017-06-04 MED ORDER — MIDAZOLAM HCL 2 MG/2ML IJ SOLN
INTRAMUSCULAR | Status: AC
  Filled 2017-06-04: qty 2

## 2017-06-04 MED ORDER — SODIUM CHLORIDE 0.9% FLUSH: 3.0000 mL | Freq: Two times a day (BID) | INTRAVENOUS | Status: DC

## 2017-06-04 MED ORDER — ISOSORBIDE MONONITRATE ER 30 MG PO TB24: 15.0000 mg | ORAL_TABLET | Freq: Every day | ORAL | Status: DC

## 2017-06-04 MED ORDER — METFORMIN HCL 500 MG PO TABS
500.0000 mg | ORAL_TABLET | Freq: Two times a day (BID) | ORAL | 0 refills | Status: DC
Start: 2017-06-07 — End: 2017-06-24

## 2017-06-04 MED ORDER — FENTANYL CITRATE (PF) 100 MCG/2ML IJ SOLN
INTRAMUSCULAR | Status: AC
  Filled 2017-06-04: qty 2

## 2017-06-04 MED ORDER — IOPAMIDOL (ISOVUE-300) INJECTION 61%
INTRAVENOUS | Status: DC | PRN
  Administered 2017-06-04: 105 mL via INTRA_ARTERIAL

## 2017-06-04 MED ORDER — SODIUM CHLORIDE 0.9% FLUSH: 3.0000 mL | INTRAVENOUS | Status: DC | PRN

## 2017-06-04 MED ORDER — HEPARIN SODIUM (PORCINE) 1000 UNIT/ML IJ SOLN
INTRAMUSCULAR | Status: AC
Start: 2017-06-04 — End: 2017-06-04
  Filled 2017-06-04: qty 1

## 2017-06-04 MED ORDER — SODIUM CHLORIDE 0.9 % IV SOLN: INTRAVENOUS | Status: DC

## 2017-06-04 MED ORDER — HEPARIN SODIUM (PORCINE) 1000 UNIT/ML IJ SOLN
INTRAMUSCULAR | Status: DC | PRN
  Administered 2017-06-04: 4000 [IU] via INTRAVENOUS

## 2017-06-04 MED ORDER — VERAPAMIL HCL 2.5 MG/ML IV SOLN
INTRAVENOUS | Status: AC
  Filled 2017-06-04: qty 2

## 2017-06-04 MED ORDER — SODIUM CHLORIDE 0.9 % WEIGHT BASED INFUSION: 1.0000 mL/kg/h | INTRAVENOUS | Status: DC

## 2017-06-04 MED ORDER — MIDAZOLAM HCL 2 MG/2ML IJ SOLN
INTRAMUSCULAR | Status: DC | PRN
  Administered 2017-06-04: 1 mg via INTRAVENOUS

## 2017-06-04 MED ORDER — FENTANYL CITRATE (PF) 100 MCG/2ML IJ SOLN
INTRAMUSCULAR | Status: DC | PRN
  Administered 2017-06-04: 50 ug via INTRAVENOUS

## 2017-06-04 MED ORDER — SODIUM CHLORIDE 0.9 % WEIGHT BASED INFUSION
3.0000 mL/kg/h | INTRAVENOUS | Status: DC
  Administered 2017-06-04: 3 mL/kg/h via INTRAVENOUS

## 2017-06-04 MED ORDER — ASPIRIN 81 MG PO CHEW: 81.0000 mg | CHEWABLE_TABLET | ORAL | Status: DC

## 2017-06-04 MED ORDER — HEPARIN (PORCINE) IN NACL 2-0.9 UNIT/ML-% IJ SOLN
INTRAMUSCULAR | Status: AC
  Filled 2017-06-04: qty 500

## 2017-06-04 MED ORDER — SODIUM CHLORIDE 0.9 % IV SOLN: 250.0000 mL | INTRAVENOUS | Status: DC | PRN

## 2017-06-04 MED ORDER — VERAPAMIL HCL 2.5 MG/ML IV SOLN
INTRAVENOUS | Status: DC | PRN
  Administered 2017-06-04: 2.5 mg via INTRA_ARTERIAL

## 2017-06-04 SURGICAL SUPPLY — 8 items
CATH 5F 110X4 TIG (CATHETERS) ×4 IMPLANT
CATH INFINITI 5FR ANG PIGTAIL (CATHETERS) ×4 IMPLANT
CATH INFINITI JR4 5F (CATHETERS) ×4 IMPLANT
DEVICE RAD TR BAND REGULAR (VASCULAR PRODUCTS) ×4 IMPLANT
GLIDESHEATH SLEND SS 6F .021 (SHEATH) ×4 IMPLANT
KIT MANI 3VAL PERCEP (MISCELLANEOUS) ×4 IMPLANT
PACK CARDIAC CATH (CUSTOM PROCEDURE TRAY) ×4 IMPLANT
WIRE ROSEN-J .035X260CM (WIRE) ×4 IMPLANT

## 2017-06-04 NOTE — H&P (View-Only) (Signed)
Cardiology Clinic Note   Patient Name: Brendan [name] Date of Encounter: 05/30/2017  Primary Care Provider:  [name], NP Primary Cardiologist:  Wallace Cullens, MD   Patient Profile    66 y/o ? with a h/o exertional c/p, HTN, HL, DM II, and tobacco abuse, who presents for f/u related to exertional chest pain.  Past Medical History    Past Medical History:  Diagnosis Date  . Chest pain    a. 05/2016 Lexi MV: EF 58%, no ischemia/infarct.  . Diabetes mellitus without complication (HCC)   . Diastolic dysfunction    a. 05/2016 Echo: EF 55%, Gr1 DD, mild TR/MR.  [name] Hyperlipidemia   . Hypertension   . Personal history of tobacco use, presenting hazards to health 05/03/2016   Past Surgical History:  Procedure Laterality Date  . BACK SURGERY    . KNEE SURGERY      Allergies  No Known Allergies  History of Present Illness    66 y/o ? with the above PMH including HTN, HL, DM II, and tobacco abuse.  He was previously evaluated by [facility] in 05/2016 in the setting of exertional chest pain.  Echo showed nl EF with grade 1 diastolic dysfxn, while stress testing was negative.  He continued to have intermittent exertional chest pain and switched his care to Dr. Okey Dupre.  In early April, he was placed on imdur therapy, which caused headaches and fatigue, and this was reduced to 1/2 tab daily, which he has since tolerated.  When he was last seen in early May, symptoms continued to occur a few x/wk and ranexa was added.  He was offered diagnostic catheterization but wanted to think about it.  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  He doesn't usually have to stop doing what he is doing.  Since his last visit, he has spoken with his dtr about the possibility of catheterization and he is now wishing to proceed.  I have spoken to him  and his dtr today re: risks and benefits.  Home Medications    Prior to Admission medications   Medication Sig Start Date End Date Taking? Authorizing Provider  albuterol (PROVENTIL HFA;VENTOLIN HFA) 108 (90 Base) MCG/ACT inhaler Inhale 2 puffs into the lungs every 6 (six) hours as needed for wheezing or shortness of breath. 09/17/16  Yes [name], NP  amLODipine (NORVASC) 10 MG tablet Take 1 tablet (10 mg total) by mouth daily. 05/28/17  Yes Johnson, Megan P, DO  aspirin 81 MG tablet Take 81 mg by mouth daily.   Yes [provider]  atorvastatin (LIPITOR) 40 MG tablet Take 1 tablet (40 mg total) by mouth daily. 09/17/16  Yes [name], NP  azelastine (ASTELIN) 0.1 % nasal spray Place 2 sprays into both nostrils every 12 (twelve) hours. 02/15/17  Yes [provider]  isosorbide mononitrate (IMDUR) 30 MG 24 hr tablet Take 1 tablet (30 mg total) by mouth daily. Patient taking differently: Take 15 mg by mouth daily.  04/02/17 07/01/17 Yes End, Cristal Deer, MD  lisinopril (PRINIVIL,ZESTRIL) 40 MG tablet Take 1 tablet (40 mg total) by mouth daily. 02/14/17  Yes [name], NP  metFORMIN (GLUCOPHAGE) 500 MG tablet Take 1 tablet (500 mg total) by mouth 2 (two) times daily with a meal. 04/03/17  Yes [name], NP  montelukast (  SINGULAIR) 10 MG tablet Take 10 mg by mouth daily. 02/13/17  Yes [provider]  ranolazine (RANEXA) 500 MG 12 hr tablet Take 1 tablet (500 mg total) by mouth 2 (two) times daily. 05/07/17  Yes End, Cristal Deerhristopher, MD  tamsulosin (FLOMAX) 0.4 MG CAPS capsule Take 1 capsule (0.4 mg total) by mouth daily. 02/22/17  Yes Gabriel CirriWicker, Cheryl, NP  nitroGLYCERIN (NITROSTAT) 0.4 MG SL tablet Place 1 tablet (0.4 mg total) under the tongue every 5 (five) minutes as needed. 05/30/17   Ok AnisBerge, Harlean Regula R, NP    Family History    Family History  Problem Relation Age of Onset  . Hypertension Mother   . Diabetes Mother   . Heart disease Father [id number]       multiple  MI's    Social History    Social History   Social History  . Marital status: Divorced    Spouse name: N/A  . Number of children: N/A  . Years of education: N/A   Occupational History  . Not on file.   Social History Main Topics  . Smoking status: Current Every Day Smoker    Packs/day: 1.50    Years: 60.00    Types: Cigarettes  . Smokeless tobacco: Never Used  . Alcohol use No     Comment: previously drank heavily but quit ~ 20 yrs ago.  . Drug use: No  . Sexual activity: Yes   Other Topics Concern  . Not on file   Social History Narrative   [place].  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  Does not routinely exercise.     Review of Systems    General:  No chills, fever, night sweats or weight changes.  Cardiovascular:  +++ ex chest pain and dyspnea on exertion, no edema, orthopnea, palpitations, paroxysmal nocturnal dyspnea. Dermatological: No rash, lesions/masses Respiratory: No cough, dyspnea Urologic: No hematuria, dysuria Abdominal:   No nausea, vomiting, diarrhea, bright red blood per rectum, melena, or hematemesis Neurologic:  No visual changes, wkns, changes in mental status. All other systems reviewed and are otherwise negative except as noted above.  Physical Exam    VS:  BP 138/80 (BP Location: Left Arm, Patient Position: Sitting, Cuff Size: Normal)   Pulse 68   Ht 6' 1.5" (1.867 m)   Wt 181 lb 4 oz (82.2 kg)   BMI 23.59 kg/m  , BMI Body mass index is 23.59 kg/m. GEN: Well nourished, well developed, in no acute distress.  HEENT: normal.  Neck: Supple, no JVD, carotid bruits, or masses. Cardiac: RRR, no murmurs, rubs, or gallops. No clubbing, cyanosis, edema.  Radials/DP/PT 2+ and equal bilaterally.  Respiratory:  Respirations regular and unlabored, clear to auscultation bilaterally. GI: Soft, nontender, nondistended, BS + x 4. MS: no deformity or atrophy. Skin: warm and dry, no rash. Neuro:  Strength and sensation are intact. Psych: Normal  affect.  Accessory Clinical Findings    ECG - RSR, 62, no acute st/t changes.  Assessment & Plan   1.  Stable Angina/CAD:  Pt with a year long h/o exertional c/p with neg MV in 05/2016.  Ss have improved some with the addition of low dose oral nitrate therapy (only tolerates 15 mg of imdur) and ranexa.  He still has ex c/p w/ dyspnea ~ 2x/wk. Ss typically last 10 mins and resolve with slowing of pace.  He has talked with his dtr re: pursuing diagnostic catheterization and he is now interested.  The patient understands that risks include  but are not limited to stroke (1 in 1000), death (1 in 1000), kidney failure [usually temporary] (1 in 500), bleeding (1 in 200), allergic reaction [possibly serious] (1 in 200), and agrees to proceed.  Cont asa, statin, nitrate, and ranexa.  Will plan on cath next week with Dr. Okey Dupre.  Labs today.  I will send in Rx for sl ntg, though he says he doesn't think he will fill it b/c he's scared it will cause him to explode.  2.  Essential HTN:  BP mildly elevated today.  Cont current meds including acei, ccb, and imdur.  We can reassess regimen post-cath and consider adding chlorthalidone if pressures remain elevated.  3.  HL:  On lipitor therapy.  LDL was 54 on March 20th.  4.  Tob Abuse:  Still smoking, though has cut down from 1.5 ppd to 1 ppd.  Complete cessation advised.  5.  DM II:  On metformin.  A1c was 5.9 last Sept.  Per IM.  He will hold metformin prior to cath next week.  6.  Disposition:  Labs today. Cath next week with Dr. Okey Dupre.  F/u in clinic ~ 2 wks post-cath.  Nicolasa Ducking, NP 05/30/2017, 2:53 PM

## 2017-06-04 NOTE — Interval H&P Note (Signed)
History and Physical Interval Note:  06/04/2017 8:22 AM  Brendan [name]  has presented today for cardiac catheterization, with the diagnosis of stable angina. The various methods of treatment have been discussed with the patient and family. After consideration of risks, benefits and other options for treatment, the patient has consented to  Procedure(s): Left Heart Cath (Left) as a surgical intervention .  The patient's history has been reviewed, patient examined, no change in status, stable for surgery.  I have reviewed the patient's chart and labs.  Questions were answered to the patient's satisfaction.    Cath Lab Visit (complete for each Cath Lab visit)  Clinical Evaluation Leading to the Procedure:   ACS: No.  Non-ACS:    Anginal Classification: CCS II  Anti-ischemic medical therapy: Maximal Therapy (2 or more classes of medications)  Non-Invasive Test Results: Low-risk stress test findings: cardiac mortality <1%/year  Prior CABG: No previous CABG  Brendan [name]

## 2017-06-06 ENCOUNTER — Encounter: Payer: Self-pay | Admitting: Cardiothoracic Surgery

## 2017-06-06 ENCOUNTER — Institutional Professional Consult (permissible substitution) (INDEPENDENT_AMBULATORY_CARE_PROVIDER_SITE_OTHER): Payer: Medicare Other | Admitting: Cardiothoracic Surgery

## 2017-06-06 ENCOUNTER — Other Ambulatory Visit: Payer: Self-pay

## 2017-06-06 VITALS — BP 106/72 | HR 67 | Resp 20 | Ht 74.0 in | Wt 180.0 lb

## 2017-06-06 DIAGNOSIS — I2510 Atherosclerotic heart disease of native coronary artery without angina pectoris: Secondary | ICD-10-CM

## 2017-06-06 DIAGNOSIS — I200 Unstable angina: Secondary | ICD-10-CM

## 2017-06-06 DIAGNOSIS — E1151 Type 2 diabetes mellitus with diabetic peripheral angiopathy without gangrene: Secondary | ICD-10-CM

## 2017-06-06 NOTE — Progress Notes (Signed)
PCP is [name], NP Referring Provider is End, Cristal Deer, MD  Chief Complaint  Patient presents with  . Coronary Artery Disease    Surgical eval, Cardiac Cath 06/14/17, Chest CT 05/07/17  Patient examined, cardiac catheterization and echocardiogram images personally reviewed and counseled with patient and daughter.  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  The patient has had symptoms of progressive, exertional angina. A stress test earlier this year was negative. However his symptoms of angina progressed despite being started on imdur and Ranexa. Cardiac catheterization was recommended and after consideration the patient underwent coronary angiography last week via right radial artery. The patient was found to have severe multivessel coronary disease, preserved LV systolic function, LVEDP was moderately elevated and echo showed no significant valvular disease. The patient had a recent scanning CT scan of the chest on the lung cancer protocol and was found have no suspicious pulmonary nodules or mediastinal adenopathy.  The patient currently has angina Brendan or 4 days per week which is relieved by rest. He denies any resting or nocturnal symptoms. He continues to smoke 1 pack per day. He is trying to cut that back in quit. He denies symptoms of orthopnea PND or ankle edema. In the past the patient was a heavy alcohol user but has not had alcohol in several years.  Past Medical History:  Diagnosis Date  . Chest pain    a. 05/2016 Lexi MV: EF 58%, no ischemia/infarct.  . Diabetes mellitus without complication (HCC)   . Diastolic dysfunction    a. 05/2016 Echo: EF 55%, Gr1 DD, mild TR/MR.  [name] Hyperlipidemia   . Hypertension   . Personal history of tobacco use, presenting hazards to health  05/03/2016    Past Surgical History:  Procedure Laterality Date  . BACK SURGERY    . KNEE SURGERY    . LEFT HEART CATH AND CORONARY ANGIOGRAPHY N/A 06/04/2017   Procedure: Left Heart Cath and Coronary Angiography;  Surgeon: Yvonne Kendall, MD;  Location: [facility] INVASIVE CV LAB;  Service: Cardiovascular;  Laterality: N/A;    Family History  Problem Relation Age of Onset  . Hypertension Mother   . Diabetes Mother   . Heart disease Father 57       multiple MI's    Social History Social History  Substance Use Topics  . Smoking status: Current Every Day Smoker    Packs/day: 1.00    Years: 60.00    Types: Cigarettes  . Smokeless tobacco: Never Used  . Alcohol use No     Comment: previously drank heavily but quit ~ 20 yrs ago.    Current Outpatient Prescriptions  Medication Sig Dispense Refill  . acetaminophen (TYLENOL) 500 MG tablet Take 1,000 mg by mouth daily.    [name] albuterol (PROVENTIL HFA;VENTOLIN HFA) 108 (90 Base) MCG/ACT inhaler Inhale 2 puffs into the lungs every 6 (six) hours as needed for wheezing or shortness of breath. 1 Inhaler 2  . amLODipine (NORVASC) 10 MG tablet Take 1 tablet (10 mg total) by mouth daily. 90 tablet 1  . aspirin 81 MG tablet Take 81 mg by mouth daily.    [name] atorvastatin (LIPITOR) 40 MG tablet Take 1 tablet (40 mg total) by mouth daily. 90 tablet 1  . azelastine (ASTELIN) 0.1 % nasal spray Place 2 sprays into both nostrils at bedtime.     [name]  isosorbide mononitrate (IMDUR) 30 MG 24 hr tablet Take 0.5 tablets (15 mg total) by mouth daily.    [name]. lisinopril (PRINIVIL,ZESTRIL) 40 MG tablet Take 1 tablet (40 mg total) by mouth daily. 90 tablet Brendan  . [START ON 06/07/2017] metFORMIN (GLUCOPHAGE) 500 MG tablet Take 1 tablet (500 mg total) by mouth 2 (two) times daily with a meal. 180 tablet 0  . montelukast (SINGULAIR) 10 MG tablet Take 10 mg by mouth daily.    . Multiple Vitamin (MULTIVITAMIN WITH MINERALS) TABS tablet Take 1 tablet by mouth daily.    . tamsulosin  (FLOMAX) 0.4 MG CAPS capsule Take 1 capsule (0.4 mg total) by mouth daily. 90 capsule Brendan  . nitroGLYCERIN (NITROSTAT) 0.4 MG SL tablet Place 1 tablet (0.4 mg total) under the tongue every 5 (five) minutes as needed. (Patient not taking: Reported on 06/06/2017) 25 tablet Brendan  . ranolazine (RANEXA) 500 MG 12 hr tablet Take 1 tablet (500 mg total) by mouth 2 (two) times daily. (Patient not taking: Reported on 06/06/2017) 180 tablet Brendan   No current facility-administered medications for this visit.     No Known Allergies  Review of Systems       Right-hand dominant     No previous thoracic injuries trauma or pneumothorax     No symptoms of claudication \   Positive lower extremity symptoms of neuropathy   Review of Systems :  [ y ] = yes, [  ] = no        General :  Weight gain [   ]    Weight loss  [   ]  Fatigue [ yes ]  Fever [  ]  Chills  [  ]                                Weakness  [  ]           HEENT    Headache [  ]  Dizziness [  ]  Blurred vision [  ] Glaucoma  [  ]                          Nosebleeds [  ] Painful or loose teeth [  ]        Cardiac :  Chest pain/ pressure Mahler.Beck[yes  ]  Resting SOB [  ] exertional SOB [mild  ]                        Orthopnea [  ]  Pedal edema  [  ]  Palpitations [  ] Syncope/presyncope [ ]                         Paroxysmal nocturnal dyspnea [  ]         Pulmonary : cough [ yes ]  wheezing [  ]  Hemoptysis [  ] Sputum [  ] Snoring [  ]                              Pneumothorax [  ]  Sleep apnea [  ]        GI : Vomiting [  ]  Dysphagia [  ]  Melena  [  ]  Abdominal pain [  ]  BRBPR [  ]              Heart burn [  ]  Constipation [  ] Diarrhea  [  ] Colonoscopy [ yes polyps removed 4 years ago  ]        GU : Hematuria [  ]  Dysuria [  ]  Nocturia [  ] UTI's [  ]        Vascular : Claudication [  ]  Rest pain [  ]  DVT [  ] Vein stripping [  ] leg ulcers [  ]                          TIA [  ] Stroke [  ]  Varicose veins [ yes venous stasis changes of both  lower legs ]        NEURO :  Headaches  [  ] Seizures [  ] Vision changes [  ] Paresthesias [  ]                                       Seizures [  ]        Musculoskeletal :  Arthritis [  ] Gout  [  ]  Back pain [ yes ]  Joint pain [  ]        Skin :  Rash [  ]  Melanoma [  ] Sores [  ]        Heme : Bleeding problems [  ]Clotting Disorders [  ] Anemia [  ]Blood Transfusion [ ]         Endocrine : Diabetes [yes on oral meds  ] Heat or Cold intolerance [  ] Polyuria [  ]excessive thirst [ ]         Psych : Depression [  ]  Anxiety [  ]  Psych hospitalizations [  ] Memory change [  ]                                               BP 106/72   Pulse 67   Resp 20   Ht 6\' 2"  (1.88 m)   Wt 180 lb (81.6 kg)   SpO2 94% Comment: RA  BMI 23.11 kg/m  Physical Exam     Physical Exam  General: Well-developed middle-aged AA [name] no acute distress, denies daughter HEENT: Normocephalic pupils equal , dentition with plates Neck: Supple without JVD, adenopathy, or bruit Chest: Clear to auscultation, symmetrical breath sounds, no rhonchi, no tenderness             or deformity Cardiovascular: Regular rate and rhythm, no murmur, no gallop, peripheral pulses             palpable in  upper extremities,, no pedal  pulses palpable Abdomen:  Soft, nontender, no palpable mass or organomegaly Extremities: Warm, well-perfused, no clubbing cyanosis edema or tenderness,              Bilateral moderate venous stasis changes of the legs Rectal/GU: Deferred Neuro: Grossly non--focal and symmetrical throughout Skin: Clean and dry without rash or ulceration, atrophic skin changes of the lower extremities   Diagnostic Tests: Three-vessel coronary disease  by cath   preserved LV function without significant valvular disease by echo  Impression: Patient would benefit from multivessel CABG with bypass grafts the LAD, diagonal, circumflex  marginal, and posterior descending.   importance of cutting back on  his cigarette intake and smoking cessation was emphasized and the patient will be scheduled for surgery in 2 weeks we can reduce his cigarette intake to less than 5 cigarettes per day and try to quit. He understands this will reduce his risk of postoperative pulmonary complications including pneumonia, pleural effusions, prolonged intubation, sepsis, death.  Plan: CABG scheduled for June 22 at [place]   Mikey Bussing, MD [facility] [phone]

## 2017-06-12 ENCOUNTER — Other Ambulatory Visit: Payer: Self-pay | Admitting: Unknown Physician Specialty

## 2017-06-12 NOTE — Telephone Encounter (Signed)
LV: 04/01/2017

## 2017-06-18 ENCOUNTER — Ambulatory Visit (HOSPITAL_COMMUNITY)
Admission: RE | Admit: 2017-06-18 | Discharge: 2017-06-18 | Disposition: A | Discharge status: Home / Self Care | Payer: Medicare Other | Source: Ambulatory Visit | Attending: Cardiothoracic Surgery | Admitting: Cardiothoracic Surgery

## 2017-06-18 ENCOUNTER — Encounter (HOSPITAL_COMMUNITY)
Admission: RE | Admit: 2017-06-18 | Discharge: 2017-06-18 | Disposition: A | Discharge status: Home / Self Care | Payer: Medicare Other | Source: Ambulatory Visit | Attending: Cardiothoracic Surgery | Admitting: Cardiothoracic Surgery

## 2017-06-18 ENCOUNTER — Encounter (HOSPITAL_COMMUNITY): Payer: Self-pay

## 2017-06-18 ENCOUNTER — Other Ambulatory Visit: Payer: Self-pay | Admitting: *Deleted

## 2017-06-18 DIAGNOSIS — I2510 Atherosclerotic heart disease of native coronary artery without angina pectoris: Secondary | ICD-10-CM

## 2017-06-18 DIAGNOSIS — J95811 Postprocedural pneumothorax: Secondary | ICD-10-CM | POA: Diagnosis not present

## 2017-06-18 DIAGNOSIS — J449 Chronic obstructive pulmonary disease, unspecified: Secondary | ICD-10-CM | POA: Diagnosis not present

## 2017-06-18 DIAGNOSIS — D62 Acute posthemorrhagic anemia: Secondary | ICD-10-CM | POA: Diagnosis not present

## 2017-06-18 DIAGNOSIS — I25118 Atherosclerotic heart disease of native coronary artery with other forms of angina pectoris: Secondary | ICD-10-CM | POA: Diagnosis not present

## 2017-06-18 DIAGNOSIS — I4891 Unspecified atrial fibrillation: Secondary | ICD-10-CM | POA: Diagnosis not present

## 2017-06-18 DIAGNOSIS — J90 Pleural effusion, not elsewhere classified: Secondary | ICD-10-CM | POA: Diagnosis not present

## 2017-06-18 DIAGNOSIS — I10 Essential (primary) hypertension: Secondary | ICD-10-CM | POA: Diagnosis not present

## 2017-06-18 DIAGNOSIS — E1151 Type 2 diabetes mellitus with diabetic peripheral angiopathy without gangrene: Secondary | ICD-10-CM | POA: Diagnosis not present

## 2017-06-18 DIAGNOSIS — E11649 Type 2 diabetes mellitus with hypoglycemia without coma: Secondary | ICD-10-CM | POA: Diagnosis not present

## 2017-06-18 DIAGNOSIS — D689 Coagulation defect, unspecified: Secondary | ICD-10-CM | POA: Diagnosis not present

## 2017-06-18 DIAGNOSIS — J9811 Atelectasis: Secondary | ICD-10-CM | POA: Diagnosis not present

## 2017-06-18 DIAGNOSIS — J9382 Other air leak: Secondary | ICD-10-CM | POA: Diagnosis not present

## 2017-06-18 DIAGNOSIS — E785 Hyperlipidemia, unspecified: Secondary | ICD-10-CM | POA: Diagnosis not present

## 2017-06-18 LAB — PULMONARY FUNCTION TEST
DL/VA % pred: 42 %
DL/VA: 2.06 ml/min/mmHg/L
DLCO unc % pred: 32 %
DLCO unc: 11.99 ml/min/mmHg
FEF 25-75 Post: 0.68 L/sec
FEF 25-75 Pre: 0.6 L/sec
FEF2575-%Change-Post: 13 %
FEF2575-%Pred-Post: 22 %
FEF2575-%Pred-Pre: 20 %
FEV1-%Change-Post: 3 %
FEV1-%Pred-Post: 67 %
FEV1-%Pred-Pre: 64 %
FEV1-Post: 2.29 L
FEV1-Pre: 2.21 L
FEV1FVC-%Change-Post: 0 %
FEV1FVC-%Pred-Pre: 55 %
FEV6-%Change-Post: 2 %
FEV6-%Pred-Post: 100 %
FEV6-%Pred-Pre: 98 %
FEV6-Post: 4.31 L
FEV6-Pre: 4.19 L
FEV6FVC-%Change-Post: 0 %
FEV6FVC-%Pred-Post: 84 %
FEV6FVC-%Pred-Pre: 84 %
FVC-%Change-Post: 3 %
FVC-%Pred-Post: 120 %
FVC-%Pred-Pre: 116 %
FVC-Post: 5.37 L
FVC-Pre: 5.19 L
Post FEV1/FVC ratio: 43 %
Post FEV6/FVC ratio: 81 %
Pre FEV1/FVC ratio: 43 %
Pre FEV6/FVC Ratio: 81 %
RV % pred: 172 %
RV: 4.42 L
TLC % pred: 124 %
TLC: 9.64 L

## 2017-06-18 LAB — URINALYSIS, ROUTINE W REFLEX MICROSCOPIC
Bilirubin Urine: NEGATIVE
Glucose, UA: NEGATIVE mg/dL
Hgb urine dipstick: NEGATIVE
Ketones, ur: NEGATIVE mg/dL
Leukocytes, UA: NEGATIVE
Nitrite: NEGATIVE
Protein, ur: NEGATIVE mg/dL
Specific Gravity, Urine: 1.01 (ref 1.005–1.030)
pH: 6 (ref 5.0–8.0)

## 2017-06-18 LAB — COMPREHENSIVE METABOLIC PANEL
ALT: 15 U/L — ABNORMAL LOW (ref 17–63)
AST: 18 U/L (ref 15–41)
Albumin: 4.3 g/dL (ref 3.5–5.0)
Alkaline Phosphatase: 70 U/L (ref 38–126)
Anion gap: 9 (ref 5–15)
BUN: 13 mg/dL (ref 6–20)
CO2: 20 mmol/L — ABNORMAL LOW (ref 22–32)
Calcium: 9.6 mg/dL (ref 8.9–10.3)
Chloride: 111 mmol/L (ref 101–111)
Creatinine, Ser: 0.93 mg/dL (ref 0.61–1.24)
GFR calc Af Amer: >60 mL/min (ref >60)
GFR calc non Af Amer: >60 mL/min (ref >60)
Glucose, Bld: 112 mg/dL — ABNORMAL HIGH (ref 65–99)
Potassium: 3.8 mmol/L (ref 3.5–5.1)
Sodium: 140 mmol/L (ref 135–145)
Total Bilirubin: 0.3 mg/dL (ref 0.3–1.2)
Total Protein: 6.5 g/dL (ref 6.5–8.1)

## 2017-06-18 LAB — BLOOD GAS, ARTERIAL
Acid-base deficit: 0.6 mmol/L (ref 0.0–2.0)
Bicarbonate: 23.1 mmol/L (ref 20.0–28.0)
Drawn by: 470591
FIO2: 21
O2 Saturation: 94.3 %
Patient temperature: 98.6
pCO2 arterial: 35.2 mmHg (ref 32.0–48.0)
pH, Arterial: 7.433 (ref 7.350–7.450)
pO2, Arterial: 69 mmHg — ABNORMAL LOW (ref 83.0–108.0)

## 2017-06-18 LAB — CBC
HCT: 45.5 % (ref 39.0–52.0)
Hemoglobin: 15.7 g/dL (ref 13.0–17.0)
MCH: 30.8 pg (ref 26.0–34.0)
MCHC: 34.5 g/dL (ref 30.0–36.0)
MCV: 89.2 fL (ref 78.0–100.0)
Platelets: 177 10*3/uL (ref 150–400)
RBC: 5.1 MIL/uL (ref 4.22–5.81)
RDW: 14.3 % (ref 11.5–15.5)
WBC: 10.6 10*3/uL — ABNORMAL HIGH (ref 4.0–10.5)

## 2017-06-18 LAB — VAS US DOPPLER PRE CABG
LEFT ECA DIAS: -14 cm/s
LEFT VERTEBRAL DIAS: 19 cm/s
Left CCA dist dias: 26 cm/s
Left CCA dist sys: 87 cm/s
Left CCA prox dias: 11 cm/s
Left CCA prox sys: 70 cm/s
Left ICA dist dias: -22 cm/s
Left ICA dist sys: -62 cm/s
Left ICA prox dias: -40 cm/s
Left ICA prox sys: -143 cm/s
RIGHT ECA DIAS: -15 cm/s
RIGHT VERTEBRAL DIAS: 15 cm/s
Right CCA prox dias: 16 cm/s
Right CCA prox sys: 78 cm/s
Right cca dist sys: -64 cm/s

## 2017-06-18 LAB — TYPE AND SCREEN
ABO/RH(D): O POS
Antibody Screen: NEGATIVE

## 2017-06-18 LAB — SURGICAL PCR SCREEN
MRSA, PCR: NEGATIVE
Staphylococcus aureus: NEGATIVE

## 2017-06-18 LAB — APTT: aPTT: 37 seconds — ABNORMAL HIGH (ref 24–36)

## 2017-06-18 LAB — HEMOGLOBIN A1C
Hgb A1c MFr Bld: 6.1 % — ABNORMAL HIGH (ref 4.8–5.6)
Mean Plasma Glucose: 128 mg/dL

## 2017-06-18 LAB — PROTIME-INR
INR: 1.1
Prothrombin Time: 14.2 seconds (ref 11.4–15.2)

## 2017-06-18 LAB — ABO/RH: ABO/RH(D): O POS

## 2017-06-18 LAB — GLUCOSE, CAPILLARY: Glucose-Capillary: 109 mg/dL — ABNORMAL HIGH (ref 65–99)

## 2017-06-18 MED ORDER — ALBUTEROL SULFATE (2.5 MG/3ML) 0.083% IN NEBU
2.5000 mg | INHALATION_SOLUTION | Freq: Once | RESPIRATORY_TRACT | Status: AC
  Administered 2017-06-18: 2.5 mg via RESPIRATORY_TRACT

## 2017-06-18 NOTE — Pre-Procedure Instructions (Signed)
Brendan [name]  06/18/2017      [facility] - [place], [place] - [address] [place] [address] Phone: [phone] Fax: [phone]    Your procedure is scheduled on June 22  Report to [facility] Admitting at 530 A.M.  Call this number if you have problems the morning of surgery:  [phone]   Remember:  Do not eat food or drink liquids after midnight.  Take these medicines the morning of surgery with A SIP OF WATER albuterol inhaler- bring your inhalers with you on the day of surgery, amlodipine (Norvasc), Nitro stat if needed.  Stop taking aspirin as directed by your dr. Stop taking BC's, Goody's, Herbal medications, Fish Oil, Vitamins, Ibuprofen, Advil, Motrin, Aleve    How to Manage Your Diabetes Before and After Surgery  Why is it important to control my blood sugar before and after surgery? . Improving blood sugar levels before and after surgery helps healing and can limit problems. . A way of improving blood sugar control is eating a healthy diet by: o  Eating less sugar and carbohydrates o  Increasing activity/exercise o  Talking with your doctor about reaching your blood sugar goals . High blood sugars (greater than 180 mg/dL) can raise your risk of infections and slow your recovery, so you will need to focus on controlling your diabetes during the weeks before surgery. . Make sure that the doctor who takes care of your diabetes knows about your planned surgery including the date and location.  How do I manage my blood sugar before surgery? . Check your blood sugar at least 4 times a day, starting 2 days before surgery, to make sure that the level is not too high or low. o Check your blood sugar the morning of your surgery when you wake up and every 2 hours until you get to the Short Stay unit. . If your blood sugar is less than 70 mg/dL, you will need to treat for low blood sugar: o Do not take insulin. o Treat a low blood  sugar (less than 70 mg/dL) with  cup of clear juice (cranberry or apple), 4 glucose tablets, OR glucose gel. o Recheck blood sugar in 15 minutes after treatment (to make sure it is greater than 70 mg/dL). If your blood sugar is not greater than 70 mg/dL on recheck, call [phone] for further instructions. . Report your blood sugar to the short stay nurse when you get to Short Stay.  . If you are admitted to the hospital after surgery: o Your blood sugar will be checked by the staff and you will probably be given insulin after surgery (instead of oral diabetes medicines) to make sure you have good blood sugar levels. o The goal for blood sugar control after surgery is 80-180 mg/dL.     WHAT DO I DO ABOUT MY DIABETES MEDICATION?   [name] Do not take oral diabetes medicines (pills) the morning of surgery. Metformin (Glucophage)   . THE NIGHT BEFORE SURGERY, take ___________ units of ___________insulin.       [name] HE MORNING OF SURGERY, take _____________ units of __________insulin.  . The day of surgery, do not take other diabetes injectables, including Byetta (exenatide), Bydureon (exenatide ER), Victoza (liraglutide), or Trulicity (dulaglutide).  . If your CBG is greater than 220 mg/dL, you may take  of your sliding scale (correction) dose of insulin.  Other Instructions:  Patient Signature:  Date:   Nurse Signature:  Date:   Reviewed and Endorsed by [facility] Patient Education Committee, August 2015  Do not wear jewelry, make-up or nail polish.  Do not wear lotions, powders, or perfumes, or deoderant.  Do not shave 48 hours prior to surgery.  Men may shave face and neck.  Do not bring valuables to the hospital.  [facility] is not responsible for any belongings or valuables.  Contacts, dentures or bridgework may not be worn into surgery.  Leave your suitcase in the car.  After surgery it may be brought to your room.  For patients admitted to the hospital, discharge  time will be determined by your treatment team.  Patients discharged the day of surgery will not be allowed to drive home.    Special instructions:  [place] - Preparing for Surgery  Before surgery, you can play an important role.  Because skin is not sterile, your skin needs to be as free of germs as possible.  You can reduce the number of germs on you skin by washing with CHG (chlorahexidine gluconate) soap before surgery.  CHG is an antiseptic cleaner which kills germs and bonds with the skin to continue killing germs even after washing.  Please DO NOT use if you have an allergy to CHG or antibacterial soaps.  If your skin becomes reddened/irritated stop using the CHG and inform your nurse when you arrive at Short Stay.  Do not shave (including legs and underarms) for at least 48 hours prior to the first CHG shower.  You may shave your face.  Please follow these instructions carefully:   1.  Shower with CHG Soap the night before surgery and the                                morning of Surgery.  2.  If you choose to wash your hair, wash your hair first as usual with your       normal shampoo.  3.  After you shampoo, rinse your hair and body thoroughly to remove the                      Shampoo.  4.  Use CHG as you would any other liquid soap.  You can apply chg directly       to the skin and wash gently with scrungie or a clean washcloth.  5.  Apply the CHG Soap to your body ONLY FROM THE NECK DOWN.        Do not use on open wounds or open sores.  Avoid contact with your eyes,       ears, mouth and genitals (private parts).  Wash genitals (private parts)       with your normal soap.  6.  Wash thoroughly, paying special attention to the area where your surgery        will be performed.  7.  Thoroughly rinse your body with warm water from the neck down.  8.  DO NOT shower/wash with your normal soap after using and rinsing off       the CHG Soap.  9.  Pat yourself dry with a clean towel.             10.  Wear clean pajamas.            11.  Place clean sheets on your bed  the night of your first shower and do not        sleep with pets.  Day of Surgery  Do not apply any lotions/deoderants the morning of surgery.  Please wear clean clothes to the hospital/surgery center.     Please read over the following fact sheets that you were given. Pain Booklet, Coughing and Deep Breathing, MRSA Information and Surgical Site Infection Prevention

## 2017-06-18 NOTE — Progress Notes (Signed)
Left message for Darius Bumpyan Brooks, RN regarding labs.

## 2017-06-18 NOTE — Progress Notes (Signed)
PCP is Gabriel Cirriheryl Wicker, NP Cardiologist is Dr Cristal Deerhristopher End Denies any chest pain. States he does get short of breath at times. Reports his fasting CBG's run 90's-100's Instructed not to smoke on the day of surgery, voices understanding.  Card cath noted from 06-04-17. Stress test and echo noted from 2017

## 2017-06-18 NOTE — Progress Notes (Signed)
Pre-op Cardiac Surgery  Carotid Findings:  Right 1-39% ICA stenosis. Left 40-59% ICA stenosis. Antegrade vertebral flow.  Upper Extremity Right Left  Brachial Pressures 122 124  Radial Waveforms Tri Tri  Ulnar Waveforms Tri Tri  Palmar Arch (Allen's Test) Normal  Normal      Lower  Extremity Right Left      Anterior Tibial 114, mono 119, bi  Posterior Tibial 107, bi 120, bi  Ankle/Brachial Indices 0.92, mild arterial disease 0.97, WNL    Farrel DemarkJill Eunice, RDMS, RVT 06/18/2017

## 2017-06-20 ENCOUNTER — Ambulatory Visit: Payer: Medicare Other | Admitting: Nurse Practitioner

## 2017-06-24 ENCOUNTER — Other Ambulatory Visit: Payer: Self-pay | Admitting: Unknown Physician Specialty

## 2017-06-30 MED ORDER — SODIUM CHLORIDE 0.9 % IV SOLN
30.0000 ug/min | INTRAVENOUS | Status: AC
  Administered 2017-07-01: 50 ug/min via INTRAVENOUS
  Filled 2017-06-30: qty 2

## 2017-06-30 MED ORDER — SODIUM CHLORIDE 0.9 % IV SOLN
INTRAVENOUS | Status: AC
  Administered 2017-07-01: 1.3 [IU]/h via INTRAVENOUS
  Administered 2017-07-01: 1 [IU]/h via INTRAVENOUS
  Filled 2017-06-30: qty 1

## 2017-06-30 MED ORDER — MAGNESIUM SULFATE 50 % IJ SOLN
40.0000 meq | INTRAMUSCULAR | Status: DC
  Filled 2017-06-30: qty 10

## 2017-06-30 MED ORDER — CEFUROXIME SODIUM 1.5 G IV SOLR
1.5000 g | INTRAVENOUS | Status: AC
  Administered 2017-07-01: .75 g via INTRAVENOUS
  Administered 2017-07-01: 1.5 g via INTRAVENOUS
  Filled 2017-06-30: qty 1.5

## 2017-06-30 MED ORDER — VANCOMYCIN HCL 10 G IV SOLR
1250.0000 mg | INTRAVENOUS | Status: AC
  Administered 2017-07-01: 1250 mg via INTRAVENOUS
  Filled 2017-06-30: qty 1250

## 2017-06-30 MED ORDER — TRANEXAMIC ACID (OHS) PUMP PRIME SOLUTION
2.0000 mg/kg | INTRAVENOUS | Status: DC
  Filled 2017-06-30: qty 1.67

## 2017-06-30 MED ORDER — SODIUM CHLORIDE 0.9 % IV SOLN
INTRAVENOUS | Status: DC
  Filled 2017-06-30: qty 30

## 2017-06-30 MED ORDER — DEXMEDETOMIDINE HCL IN NACL 400 MCG/100ML IV SOLN
0.1000 ug/kg/h | INTRAVENOUS | Status: AC
  Administered 2017-07-01: .3 ug/kg/h via INTRAVENOUS
  Filled 2017-06-30: qty 100

## 2017-06-30 MED ORDER — EPINEPHRINE PF 1 MG/ML IJ SOLN
0.0000 ug/min | INTRAVENOUS | Status: DC
  Filled 2017-06-30: qty 4

## 2017-06-30 MED ORDER — TRANEXAMIC ACID (OHS) BOLUS VIA INFUSION
15.0000 mg/kg | INTRAVENOUS | Status: AC
  Administered 2017-07-01: 1255.5 mg via INTRAVENOUS
  Filled 2017-06-30: qty 1256

## 2017-06-30 MED ORDER — TRANEXAMIC ACID 1000 MG/10ML IV SOLN
1.5000 mg/kg/h | INTRAVENOUS | Status: AC
  Administered 2017-07-01: 1.5 mg/kg/h via INTRAVENOUS
  Filled 2017-06-30: qty 25

## 2017-06-30 MED ORDER — POTASSIUM CHLORIDE 2 MEQ/ML IV SOLN
80.0000 meq | INTRAVENOUS | Status: DC
  Filled 2017-06-30: qty 40

## 2017-06-30 MED ORDER — NITROGLYCERIN IN D5W 200-5 MCG/ML-% IV SOLN
2.0000 ug/min | INTRAVENOUS | Status: DC
  Filled 2017-06-30: qty 250

## 2017-06-30 MED ORDER — PLASMA-LYTE 148 IV SOLN
INTRAVENOUS | Status: AC
  Administered 2017-07-01: 09:00:00
  Filled 2017-06-30: qty 2.5

## 2017-06-30 MED ORDER — DOPAMINE-DEXTROSE 3.2-5 MG/ML-% IV SOLN
0.0000 ug/kg/min | INTRAVENOUS | Status: DC
  Filled 2017-06-30: qty 250

## 2017-06-30 MED ORDER — CEFUROXIME SODIUM 750 MG IJ SOLR
750.0000 mg | INTRAMUSCULAR | Status: DC
  Filled 2017-06-30: qty 750

## 2017-07-01 ENCOUNTER — Inpatient Hospital Stay (HOSPITAL_COMMUNITY): Payer: Medicare Other | Admitting: Certified Registered Nurse Anesthetist

## 2017-07-01 ENCOUNTER — Inpatient Hospital Stay (HOSPITAL_COMMUNITY): Payer: Medicare Other

## 2017-07-01 ENCOUNTER — Inpatient Hospital Stay (HOSPITAL_COMMUNITY)
Admission: RE | Disposition: A | Discharge status: Skilled Nursing Facility | Payer: Self-pay | Source: Ambulatory Visit | Attending: Cardiothoracic Surgery

## 2017-07-01 ENCOUNTER — Encounter (HOSPITAL_COMMUNITY): Payer: Self-pay | Admitting: *Deleted

## 2017-07-01 ENCOUNTER — Inpatient Hospital Stay (HOSPITAL_COMMUNITY)
Admission: RE | Admit: 2017-07-01 | Discharge: 2017-07-09 | DRG: 236 | Disposition: A | Discharge status: Skilled Nursing Facility | Payer: Medicare Other | Source: Ambulatory Visit | Attending: Cardiothoracic Surgery | Admitting: Cardiothoracic Surgery

## 2017-07-01 DIAGNOSIS — R0602 Shortness of breath: Secondary | ICD-10-CM | POA: Diagnosis not present

## 2017-07-01 DIAGNOSIS — E876 Hypokalemia: Secondary | ICD-10-CM | POA: Diagnosis not present

## 2017-07-01 DIAGNOSIS — F17200 Nicotine dependence, unspecified, uncomplicated: Secondary | ICD-10-CM | POA: Diagnosis present

## 2017-07-01 DIAGNOSIS — M6281 Muscle weakness (generalized): Secondary | ICD-10-CM | POA: Diagnosis not present

## 2017-07-01 DIAGNOSIS — R918 Other nonspecific abnormal finding of lung field: Secondary | ICD-10-CM | POA: Diagnosis not present

## 2017-07-01 DIAGNOSIS — Z7982 Long term (current) use of aspirin: Secondary | ICD-10-CM

## 2017-07-01 DIAGNOSIS — E11649 Type 2 diabetes mellitus with hypoglycemia without coma: Secondary | ICD-10-CM | POA: Diagnosis not present

## 2017-07-01 DIAGNOSIS — J90 Pleural effusion, not elsewhere classified: Secondary | ICD-10-CM | POA: Diagnosis not present

## 2017-07-01 DIAGNOSIS — E8770 Fluid overload, unspecified: Secondary | ICD-10-CM | POA: Diagnosis not present

## 2017-07-01 DIAGNOSIS — I240 Acute coronary thrombosis not resulting in myocardial infarction: Secondary | ICD-10-CM | POA: Diagnosis not present

## 2017-07-01 DIAGNOSIS — I4891 Unspecified atrial fibrillation: Secondary | ICD-10-CM | POA: Diagnosis present

## 2017-07-01 DIAGNOSIS — Z9689 Presence of other specified functional implants: Secondary | ICD-10-CM

## 2017-07-01 DIAGNOSIS — J9811 Atelectasis: Secondary | ICD-10-CM | POA: Diagnosis not present

## 2017-07-01 DIAGNOSIS — Z79899 Other long term (current) drug therapy: Secondary | ICD-10-CM

## 2017-07-01 DIAGNOSIS — J939 Pneumothorax, unspecified: Secondary | ICD-10-CM | POA: Diagnosis not present

## 2017-07-01 DIAGNOSIS — I209 Angina pectoris, unspecified: Secondary | ICD-10-CM | POA: Diagnosis not present

## 2017-07-01 DIAGNOSIS — Z4682 Encounter for fitting and adjustment of non-vascular catheter: Secondary | ICD-10-CM | POA: Diagnosis not present

## 2017-07-01 DIAGNOSIS — I25118 Atherosclerotic heart disease of native coronary artery with other forms of angina pectoris: Principal | ICD-10-CM | POA: Diagnosis present

## 2017-07-01 DIAGNOSIS — E785 Hyperlipidemia, unspecified: Secondary | ICD-10-CM | POA: Diagnosis present

## 2017-07-01 DIAGNOSIS — D62 Acute posthemorrhagic anemia: Secondary | ICD-10-CM | POA: Diagnosis not present

## 2017-07-01 DIAGNOSIS — I10 Essential (primary) hypertension: Secondary | ICD-10-CM | POA: Diagnosis present

## 2017-07-01 DIAGNOSIS — I2510 Atherosclerotic heart disease of native coronary artery without angina pectoris: Secondary | ICD-10-CM | POA: Diagnosis present

## 2017-07-01 DIAGNOSIS — J9382 Other air leak: Secondary | ICD-10-CM | POA: Diagnosis not present

## 2017-07-01 DIAGNOSIS — E1151 Type 2 diabetes mellitus with diabetic peripheral angiopathy without gangrene: Secondary | ICD-10-CM | POA: Diagnosis present

## 2017-07-01 DIAGNOSIS — I25119 Atherosclerotic heart disease of native coronary artery with unspecified angina pectoris: Secondary | ICD-10-CM | POA: Diagnosis not present

## 2017-07-01 DIAGNOSIS — D689 Coagulation defect, unspecified: Secondary | ICD-10-CM | POA: Diagnosis present

## 2017-07-01 DIAGNOSIS — Z7984 Long term (current) use of oral hypoglycemic drugs: Secondary | ICD-10-CM

## 2017-07-01 DIAGNOSIS — Z5189 Encounter for other specified aftercare: Secondary | ICD-10-CM | POA: Diagnosis not present

## 2017-07-01 DIAGNOSIS — R498 Other voice and resonance disorders: Secondary | ICD-10-CM | POA: Diagnosis not present

## 2017-07-01 DIAGNOSIS — Z951 Presence of aortocoronary bypass graft: Secondary | ICD-10-CM

## 2017-07-01 DIAGNOSIS — Z9889 Other specified postprocedural states: Secondary | ICD-10-CM

## 2017-07-01 DIAGNOSIS — I081 Rheumatic disorders of both mitral and tricuspid valves: Secondary | ICD-10-CM | POA: Diagnosis not present

## 2017-07-01 DIAGNOSIS — J95811 Postprocedural pneumothorax: Secondary | ICD-10-CM | POA: Diagnosis not present

## 2017-07-01 DIAGNOSIS — I25110 Atherosclerotic heart disease of native coronary artery with unstable angina pectoris: Secondary | ICD-10-CM | POA: Diagnosis not present

## 2017-07-01 DIAGNOSIS — I5022 Chronic systolic (congestive) heart failure: Secondary | ICD-10-CM | POA: Diagnosis not present

## 2017-07-01 DIAGNOSIS — D508 Other iron deficiency anemias: Secondary | ICD-10-CM | POA: Diagnosis not present

## 2017-07-01 HISTORY — PX: CORONARY ARTERY BYPASS GRAFT: SHX141

## 2017-07-01 HISTORY — PX: TEE WITHOUT CARDIOVERSION: SHX5443

## 2017-07-01 LAB — BASIC METABOLIC PANEL
Anion gap: 8 (ref 5–15)
BUN: 14 mg/dL (ref 6–20)
CO2: 24 mmol/L (ref 22–32)
Calcium: 8.9 mg/dL (ref 8.9–10.3)
Chloride: 108 mmol/L (ref 101–111)
Creatinine, Ser: 0.83 mg/dL (ref 0.61–1.24)
GFR calc Af Amer: >60 mL/min (ref >60)
GFR calc non Af Amer: >60 mL/min (ref >60)
Glucose, Bld: 112 mg/dL — ABNORMAL HIGH (ref 65–99)
Potassium: 3.5 mmol/L (ref 3.5–5.1)
Sodium: 140 mmol/L (ref 135–145)

## 2017-07-01 LAB — HEMOGLOBIN AND HEMATOCRIT, BLOOD
HCT: 34.5 % — ABNORMAL LOW (ref 39.0–52.0)
Hemoglobin: 11.7 g/dL — ABNORMAL LOW (ref 13.0–17.0)

## 2017-07-01 LAB — POCT I-STAT, CHEM 8
BUN: 14 mg/dL (ref 6–20)
BUN: 13 mg/dL (ref 6–20)
BUN: 9 mg/dL (ref 6–20)
BUN: 11 mg/dL (ref 6–20)
BUN: 11 mg/dL (ref 6–20)
BUN: 10 mg/dL (ref 6–20)
BUN: 10 mg/dL (ref 6–20)
Calcium, Ion: 1.27 mmol/L (ref 1.15–1.40)
Calcium, Ion: 1.23 mmol/L (ref 1.15–1.40)
Calcium, Ion: 0.83 mmol/L — CL (ref 1.15–1.40)
Calcium, Ion: 1.06 mmol/L — ABNORMAL LOW (ref 1.15–1.40)
Calcium, Ion: 1.09 mmol/L — ABNORMAL LOW (ref 1.15–1.40)
Calcium, Ion: 1.03 mmol/L — ABNORMAL LOW (ref 1.15–1.40)
Calcium, Ion: 0.99 mmol/L — ABNORMAL LOW (ref 1.15–1.40)
Chloride: 105 mmol/L (ref 101–111)
Chloride: 104 mmol/L (ref 101–111)
Chloride: 98 mmol/L — ABNORMAL LOW (ref 101–111)
Chloride: 105 mmol/L (ref 101–111)
Chloride: 106 mmol/L (ref 101–111)
Chloride: 103 mmol/L (ref 101–111)
Chloride: 105 mmol/L (ref 101–111)
Creatinine, Ser: 0.6 mg/dL — ABNORMAL LOW (ref 0.61–1.24)
Creatinine, Ser: 0.6 mg/dL — ABNORMAL LOW (ref 0.61–1.24)
Creatinine, Ser: 0.3 mg/dL — ABNORMAL LOW (ref 0.61–1.24)
Creatinine, Ser: 0.5 mg/dL — ABNORMAL LOW (ref 0.61–1.24)
Creatinine, Ser: 0.6 mg/dL — ABNORMAL LOW (ref 0.61–1.24)
Creatinine, Ser: 0.6 mg/dL — ABNORMAL LOW (ref 0.61–1.24)
Creatinine, Ser: 0.7 mg/dL (ref 0.61–1.24)
Glucose, Bld: 124 mg/dL — ABNORMAL HIGH (ref 65–99)
Glucose, Bld: 149 mg/dL — ABNORMAL HIGH (ref 65–99)
Glucose, Bld: 130 mg/dL — ABNORMAL HIGH (ref 65–99)
Glucose, Bld: 174 mg/dL — ABNORMAL HIGH (ref 65–99)
Glucose, Bld: 179 mg/dL — ABNORMAL HIGH (ref 65–99)
Glucose, Bld: 152 mg/dL — ABNORMAL HIGH (ref 65–99)
Glucose, Bld: 160 mg/dL — ABNORMAL HIGH (ref 65–99)
HCT: 42 % (ref 39.0–52.0)
HCT: 38 % — ABNORMAL LOW (ref 39.0–52.0)
HCT: 26 % — ABNORMAL LOW (ref 39.0–52.0)
HCT: 33 % — ABNORMAL LOW (ref 39.0–52.0)
HCT: 34 % — ABNORMAL LOW (ref 39.0–52.0)
HCT: 31 % — ABNORMAL LOW (ref 39.0–52.0)
HCT: 28 % — ABNORMAL LOW (ref 39.0–52.0)
Hemoglobin: 14.3 g/dL (ref 13.0–17.0)
Hemoglobin: 12.9 g/dL — ABNORMAL LOW (ref 13.0–17.0)
Hemoglobin: 8.8 g/dL — ABNORMAL LOW (ref 13.0–17.0)
Hemoglobin: 11.2 g/dL — ABNORMAL LOW (ref 13.0–17.0)
Hemoglobin: 11.6 g/dL — ABNORMAL LOW (ref 13.0–17.0)
Hemoglobin: 10.5 g/dL — ABNORMAL LOW (ref 13.0–17.0)
Hemoglobin: 9.5 g/dL — ABNORMAL LOW (ref 13.0–17.0)
Potassium: 3.6 mmol/L (ref 3.5–5.1)
Potassium: 4 mmol/L (ref 3.5–5.1)
Potassium: 3.8 mmol/L (ref 3.5–5.1)
Potassium: 3.9 mmol/L (ref 3.5–5.1)
Potassium: 4 mmol/L (ref 3.5–5.1)
Potassium: 3.6 mmol/L (ref 3.5–5.1)
Potassium: 3.7 mmol/L (ref 3.5–5.1)
Sodium: 145 mmol/L (ref 135–145)
Sodium: 145 mmol/L (ref 135–145)
Sodium: 139 mmol/L (ref 135–145)
Sodium: 143 mmol/L (ref 135–145)
Sodium: 143 mmol/L (ref 135–145)
Sodium: 144 mmol/L (ref 135–145)
Sodium: 143 mmol/L (ref 135–145)
TCO2: 28 mmol/L (ref 0–100)
TCO2: 29 mmol/L (ref 0–100)
TCO2: 24 mmol/L (ref 0–100)
TCO2: 26 mmol/L (ref 0–100)
TCO2: 26 mmol/L (ref 0–100)
TCO2: 26 mmol/L (ref 0–100)
TCO2: 21 mmol/L (ref 0–100)

## 2017-07-01 LAB — POCT I-STAT 3, ART BLOOD GAS (G3+)
Acid-base deficit: 3 mmol/L — ABNORMAL HIGH (ref 0.0–2.0)
Acid-base deficit: 1 mmol/L (ref 0.0–2.0)
Acid-base deficit: 2 mmol/L (ref 0.0–2.0)
Acid-base deficit: 4 mmol/L — ABNORMAL HIGH (ref 0.0–2.0)
Acid-base deficit: 4 mmol/L — ABNORMAL HIGH (ref 0.0–2.0)
Bicarbonate: 23.3 mmol/L (ref 20.0–28.0)
Bicarbonate: 26.8 mmol/L (ref 20.0–28.0)
Bicarbonate: 23.8 mmol/L (ref 20.0–28.0)
Bicarbonate: 22.3 mmol/L (ref 20.0–28.0)
Bicarbonate: 21.3 mmol/L (ref 20.0–28.0)
O2 Saturation: 100 %
O2 Saturation: 100 %
O2 Saturation: 92 %
O2 Saturation: 93 %
O2 Saturation: 86 %
Patient temperature: 35.8
Patient temperature: 36.3
Patient temperature: 36.6
TCO2: 25 mmol/L (ref 0–100)
TCO2: 29 mmol/L (ref 0–100)
TCO2: 25 mmol/L (ref 0–100)
TCO2: 24 mmol/L (ref 0–100)
TCO2: 22 mmol/L (ref 0–100)
pCO2 arterial: 47.7 mmHg (ref 32.0–48.0)
pCO2 arterial: 58.7 mmHg — ABNORMAL HIGH (ref 32.0–48.0)
pCO2 arterial: 42.7 mmHg (ref 32.0–48.0)
pCO2 arterial: 42.7 mmHg (ref 32.0–48.0)
pCO2 arterial: 36.5 mmHg (ref 32.0–48.0)
pH, Arterial: 7.297 — ABNORMAL LOW (ref 7.350–7.450)
pH, Arterial: 7.268 — ABNORMAL LOW (ref 7.350–7.450)
pH, Arterial: 7.348 — ABNORMAL LOW (ref 7.350–7.450)
pH, Arterial: 7.323 — ABNORMAL LOW (ref 7.350–7.450)
pH, Arterial: 7.373 (ref 7.350–7.450)
pO2, Arterial: 338 mmHg — ABNORMAL HIGH (ref 83.0–108.0)
pO2, Arterial: 198 mmHg — ABNORMAL HIGH (ref 83.0–108.0)
pO2, Arterial: 64 mmHg — ABNORMAL LOW (ref 83.0–108.0)
pO2, Arterial: 71 mmHg — ABNORMAL LOW (ref 83.0–108.0)
pO2, Arterial: 52 mmHg — ABNORMAL LOW (ref 83.0–108.0)

## 2017-07-01 LAB — CBC
HCT: 44.8 % (ref 39.0–52.0)
HCT: 29.9 % — ABNORMAL LOW (ref 39.0–52.0)
Hemoglobin: 15.6 g/dL (ref 13.0–17.0)
Hemoglobin: 10.2 g/dL — ABNORMAL LOW (ref 13.0–17.0)
MCH: 31.1 pg (ref 26.0–34.0)
MCH: 30.1 pg (ref 26.0–34.0)
MCHC: 34.8 g/dL (ref 30.0–36.0)
MCHC: 34.1 g/dL (ref 30.0–36.0)
MCV: 89.4 fL (ref 78.0–100.0)
MCV: 88.2 fL (ref 78.0–100.0)
Platelets: 173 10*3/uL (ref 150–400)
Platelets: 124 10*3/uL — ABNORMAL LOW (ref 150–400)
RBC: 5.01 MIL/uL (ref 4.22–5.81)
RBC: 3.39 MIL/uL — ABNORMAL LOW (ref 4.22–5.81)
RDW: 14.4 % (ref 11.5–15.5)
RDW: 14 % (ref 11.5–15.5)
WBC: 8.6 10*3/uL (ref 4.0–10.5)
WBC: 19.8 10*3/uL — ABNORMAL HIGH (ref 4.0–10.5)

## 2017-07-01 LAB — CREATININE, SERUM
Creatinine, Ser: 0.85 mg/dL (ref 0.61–1.24)
GFR calc Af Amer: >60 mL/min (ref >60)
GFR calc non Af Amer: >60 mL/min (ref >60)

## 2017-07-01 LAB — PLATELET COUNT: Platelets: 142 10*3/uL — ABNORMAL LOW (ref 150–400)

## 2017-07-01 LAB — GLUCOSE, CAPILLARY
Glucose-Capillary: 126 mg/dL — ABNORMAL HIGH (ref 65–99)
Glucose-Capillary: 125 mg/dL — ABNORMAL HIGH (ref 65–99)
Glucose-Capillary: 139 mg/dL — ABNORMAL HIGH (ref 65–99)
Glucose-Capillary: 153 mg/dL — ABNORMAL HIGH (ref 65–99)
Glucose-Capillary: 159 mg/dL — ABNORMAL HIGH (ref 65–99)
Glucose-Capillary: 147 mg/dL — ABNORMAL HIGH (ref 65–99)
Glucose-Capillary: 154 mg/dL — ABNORMAL HIGH (ref 65–99)
Glucose-Capillary: 182 mg/dL — ABNORMAL HIGH (ref 65–99)
Glucose-Capillary: 164 mg/dL — ABNORMAL HIGH (ref 65–99)
Glucose-Capillary: 139 mg/dL — ABNORMAL HIGH (ref 65–99)

## 2017-07-01 LAB — POCT I-STAT 4, (NA,K, GLUC, HGB,HCT)
Glucose, Bld: 142 mg/dL — ABNORMAL HIGH (ref 65–99)
HCT: 33 % — ABNORMAL LOW (ref 39.0–52.0)
Hemoglobin: 11.2 g/dL — ABNORMAL LOW (ref 13.0–17.0)
Potassium: 3.4 mmol/L — ABNORMAL LOW (ref 3.5–5.1)
Sodium: 146 mmol/L — ABNORMAL HIGH (ref 135–145)

## 2017-07-01 LAB — PREPARE RBC (CROSSMATCH)

## 2017-07-01 LAB — MAGNESIUM: Magnesium: 2.6 mg/dL — ABNORMAL HIGH (ref 1.7–2.4)

## 2017-07-01 SURGERY — CORONARY ARTERY BYPASS GRAFTING (CABG)
Anesthesia: General | Site: Chest

## 2017-07-01 MED ORDER — FUROSEMIDE 10 MG/ML IJ SOLN
20.0000 mg | Freq: Once | INTRAMUSCULAR | Status: AC
  Administered 2017-07-02: 20 mg via INTRAVENOUS
  Filled 2017-07-01: qty 2

## 2017-07-01 MED ORDER — EPHEDRINE 5 MG/ML INJ
INTRAVENOUS | Status: AC
  Filled 2017-07-01: qty 10

## 2017-07-01 MED ORDER — MIDAZOLAM HCL 2 MG/2ML IJ SOLN
2.0000 mg | INTRAMUSCULAR | Status: DC | PRN
  Administered 2017-07-01 (×2): 2 mg via INTRAVENOUS
  Filled 2017-07-01 (×2): qty 2

## 2017-07-01 MED ORDER — LACTATED RINGERS IV SOLN
INTRAVENOUS | Status: DC | PRN
Start: 2017-07-01 — End: 2017-07-01
  Administered 2017-07-01 (×2): via INTRAVENOUS

## 2017-07-01 MED ORDER — CHLORHEXIDINE GLUCONATE 0.12 % MT SOLN
15.0000 mL | OROMUCOSAL | Status: AC
  Administered 2017-07-01: 15 mL via OROMUCOSAL

## 2017-07-01 MED ORDER — SODIUM CHLORIDE 0.9% FLUSH
10.0000 mL | Freq: Two times a day (BID) | INTRAVENOUS | Status: DC
  Administered 2017-07-01 – 2017-07-05 (×8): 10 mL

## 2017-07-01 MED ORDER — MIDAZOLAM HCL 10 MG/2ML IJ SOLN
INTRAMUSCULAR | Status: AC
  Filled 2017-07-01: qty 2

## 2017-07-01 MED ORDER — ACETAMINOPHEN 500 MG PO TABS
1000.0000 mg | ORAL_TABLET | Freq: Four times a day (QID) | ORAL | Status: AC
  Administered 2017-07-02 – 2017-07-06 (×18): 1000 mg via ORAL
  Filled 2017-07-01 (×22): qty 2

## 2017-07-01 MED ORDER — ROCURONIUM BROMIDE 10 MG/ML (PF) SYRINGE
PREFILLED_SYRINGE | INTRAVENOUS | Status: DC | PRN
  Administered 2017-07-01 (×4): 50 mg via INTRAVENOUS

## 2017-07-01 MED ORDER — AZELASTINE HCL 0.1 % NA SOLN
2.0000 | Freq: Every day | NASAL | Status: DC
  Filled 2017-07-01: qty 30

## 2017-07-01 MED ORDER — ADULT MULTIVITAMIN W/MINERALS CH
1.0000 | ORAL_TABLET | Freq: Every day | ORAL | Status: DC
  Administered 2017-07-02 – 2017-07-09 (×8): 1 via ORAL
  Filled 2017-07-01 (×8): qty 1

## 2017-07-01 MED ORDER — AMIODARONE IV BOLUS ONLY 150 MG/100ML
INTRAVENOUS | Status: DC | PRN
  Administered 2017-07-01: 150 mg via INTRAVENOUS

## 2017-07-01 MED ORDER — HEMOSTATIC AGENTS (NO CHARGE) OPTIME
TOPICAL | Status: DC | PRN
  Administered 2017-07-01 (×4): 1 via TOPICAL

## 2017-07-01 MED ORDER — HEPARIN SODIUM (PORCINE) 1000 UNIT/ML IJ SOLN
INTRAMUSCULAR | Status: AC
  Filled 2017-07-01: qty 1

## 2017-07-01 MED ORDER — METOPROLOL TARTRATE 25 MG/10 ML ORAL SUSPENSION
12.5000 mg | Freq: Two times a day (BID) | ORAL | Status: DC
  Filled 2017-07-01: qty 10

## 2017-07-01 MED ORDER — PROPOFOL 10 MG/ML IV BOLUS
INTRAVENOUS | Status: DC | PRN
  Administered 2017-07-01: 120 mg via INTRAVENOUS

## 2017-07-01 MED ORDER — LEVALBUTEROL HCL 0.63 MG/3ML IN NEBU
INHALATION_SOLUTION | RESPIRATORY_TRACT | Status: AC
Start: 2017-07-01 — End: 2017-07-01
  Administered 2017-07-01: 0.63 mg via RESPIRATORY_TRACT
  Filled 2017-07-01: qty 3

## 2017-07-01 MED ORDER — METOCLOPRAMIDE HCL 5 MG/ML IJ SOLN
10.0000 mg | Freq: Four times a day (QID) | INTRAMUSCULAR | Status: DC
  Administered 2017-07-01 – 2017-07-05 (×15): 10 mg via INTRAVENOUS
  Filled 2017-07-01 (×14): qty 2

## 2017-07-01 MED ORDER — MORPHINE SULFATE (PF) 2 MG/ML IV SOLN: 2.0000 mg | INTRAVENOUS | Status: DC | PRN

## 2017-07-01 MED ORDER — PHENYLEPHRINE HCL 10 MG/ML IJ SOLN
INTRAMUSCULAR | Status: DC | PRN
  Administered 2017-07-01 (×3): 80 ug via INTRAVENOUS
  Administered 2017-07-01: 120 ug via INTRAVENOUS
  Administered 2017-07-01: 80 ug via INTRAVENOUS
  Administered 2017-07-01: 120 ug via INTRAVENOUS
  Administered 2017-07-01: 80 ug via INTRAVENOUS

## 2017-07-01 MED ORDER — ALBUMIN HUMAN 5 % IV SOLN
250.0000 mL | INTRAVENOUS | Status: AC | PRN
  Administered 2017-07-01 (×2): 250 mL via INTRAVENOUS

## 2017-07-01 MED ORDER — LACTATED RINGERS IV SOLN
INTRAVENOUS | Status: DC | PRN
Start: 2017-07-01 — End: 2017-07-01
  Administered 2017-07-01: 06:00:00 via INTRAVENOUS

## 2017-07-01 MED ORDER — ASPIRIN 81 MG PO CHEW
324.0000 mg | CHEWABLE_TABLET | Freq: Every day | ORAL | Status: DC
  Administered 2017-07-07: 324 mg
  Filled 2017-07-01 (×3): qty 4

## 2017-07-01 MED ORDER — FAMOTIDINE IN NACL 20-0.9 MG/50ML-% IV SOLN
20.0000 mg | Freq: Two times a day (BID) | INTRAVENOUS | Status: AC
  Administered 2017-07-01 (×2): 20 mg via INTRAVENOUS
  Filled 2017-07-01: qty 50

## 2017-07-01 MED ORDER — LEVALBUTEROL HCL 0.63 MG/3ML IN NEBU
0.6300 mg | INHALATION_SOLUTION | Freq: Three times a day (TID) | RESPIRATORY_TRACT | Status: DC
  Administered 2017-07-01 – 2017-07-06 (×17): 0.63 mg via RESPIRATORY_TRACT
  Filled 2017-07-01 (×17): qty 3

## 2017-07-01 MED ORDER — ONDANSETRON HCL 4 MG/2ML IJ SOLN: 4.0000 mg | Freq: Four times a day (QID) | INTRAMUSCULAR | Status: DC | PRN

## 2017-07-01 MED ORDER — PROPOFOL 10 MG/ML IV BOLUS
INTRAVENOUS | Status: AC
  Filled 2017-07-01: qty 20

## 2017-07-01 MED ORDER — SODIUM CHLORIDE 0.45 % IV SOLN
INTRAVENOUS | Status: DC | PRN
  Administered 2017-07-01: 14:00:00 via INTRAVENOUS

## 2017-07-01 MED ORDER — ALBUMIN HUMAN 5 % IV SOLN
INTRAVENOUS | Status: DC | PRN
  Administered 2017-07-01 (×2): via INTRAVENOUS

## 2017-07-01 MED ORDER — NICOTINE 21 MG/24HR TD PT24
21.0000 mg | MEDICATED_PATCH | Freq: Every day | TRANSDERMAL | Status: DC
  Administered 2017-07-01 – 2017-07-09 (×9): 21 mg via TRANSDERMAL
  Filled 2017-07-01 (×9): qty 1

## 2017-07-01 MED ORDER — MORPHINE SULFATE (PF) 2 MG/ML IV SOLN: 1.0000 mg | INTRAVENOUS | Status: DC | PRN

## 2017-07-01 MED ORDER — SODIUM CHLORIDE 0.9 % IV SOLN
0.0000 ug/min | INTRAVENOUS | Status: DC
  Administered 2017-07-01: 90 ug/min via INTRAVENOUS
  Filled 2017-07-01 (×2): qty 2

## 2017-07-01 MED ORDER — SODIUM CHLORIDE 0.9% FLUSH
3.0000 mL | INTRAVENOUS | Status: DC | PRN
  Administered 2017-07-02: 3 mL via INTRAVENOUS
  Filled 2017-07-01: qty 3

## 2017-07-01 MED ORDER — ATORVASTATIN CALCIUM 40 MG PO TABS
40.0000 mg | ORAL_TABLET | Freq: Every day | ORAL | Status: DC
  Administered 2017-07-02 – 2017-07-09 (×8): 40 mg via ORAL
  Filled 2017-07-01 (×8): qty 1

## 2017-07-01 MED ORDER — TRAMADOL HCL 50 MG PO TABS: 50.0000 mg | ORAL_TABLET | ORAL | Status: DC | PRN

## 2017-07-01 MED ORDER — CHLORHEXIDINE GLUCONATE 0.12% ORAL RINSE (MEDLINE KIT)
15.0000 mL | Freq: Two times a day (BID) | OROMUCOSAL | Status: DC
  Administered 2017-07-01: 15 mL via OROMUCOSAL

## 2017-07-01 MED ORDER — BISACODYL 5 MG PO TBEC
10.0000 mg | DELAYED_RELEASE_TABLET | Freq: Every day | ORAL | Status: DC
  Administered 2017-07-02 – 2017-07-09 (×6): 10 mg via ORAL
  Filled 2017-07-01 (×7): qty 2

## 2017-07-01 MED ORDER — ACETAMINOPHEN 160 MG/5ML PO SOLN: 650.0000 mg | Freq: Once | ORAL | Status: AC

## 2017-07-01 MED ORDER — OXYCODONE HCL 5 MG PO TABS
5.0000 mg | ORAL_TABLET | ORAL | Status: DC | PRN
  Administered 2017-07-02 – 2017-07-06 (×5): 10 mg via ORAL
  Administered 2017-07-06: 5 mg via ORAL
  Administered 2017-07-07 – 2017-07-09 (×7): 10 mg via ORAL
  Filled 2017-07-01 (×13): qty 2

## 2017-07-01 MED ORDER — POTASSIUM CHLORIDE 10 MEQ/50ML IV SOLN
10.0000 meq | INTRAVENOUS | Status: AC
  Administered 2017-07-01 (×3): 10 meq via INTRAVENOUS

## 2017-07-01 MED ORDER — METOPROLOL TARTRATE 5 MG/5ML IV SOLN: 2.5000 mg | INTRAVENOUS | Status: DC | PRN

## 2017-07-01 MED ORDER — MORPHINE SULFATE (PF) 4 MG/ML IV SOLN
2.0000 mg | INTRAVENOUS | Status: DC | PRN
  Administered 2017-07-03: 2 mg via INTRAVENOUS
  Filled 2017-07-01: qty 1

## 2017-07-01 MED ORDER — AMIODARONE HCL IN DEXTROSE 360-4.14 MG/200ML-% IV SOLN
30.0000 mg/h | INTRAVENOUS | Status: DC
  Administered 2017-07-01: 30 mg/h via INTRAVENOUS

## 2017-07-01 MED ORDER — SODIUM CHLORIDE 0.9 % IV SOLN
30.0000 ug/min | INTRAVENOUS | Status: DC
  Filled 2017-07-01: qty 2

## 2017-07-01 MED ORDER — ASPIRIN EC 325 MG PO TBEC
325.0000 mg | DELAYED_RELEASE_TABLET | Freq: Every day | ORAL | Status: DC
  Administered 2017-07-02 – 2017-07-09 (×7): 325 mg via ORAL
  Filled 2017-07-01 (×8): qty 1

## 2017-07-01 MED ORDER — TAMSULOSIN HCL 0.4 MG PO CAPS
0.4000 mg | ORAL_CAPSULE | Freq: Every day | ORAL | Status: DC
  Administered 2017-07-02 – 2017-07-08 (×7): 0.4 mg via ORAL
  Filled 2017-07-01 (×6): qty 1

## 2017-07-01 MED ORDER — SODIUM CHLORIDE 0.9 % IV SOLN
0.0000 ug/kg/h | INTRAVENOUS | Status: DC
  Administered 2017-07-01: 1 ug/kg/h via INTRAVENOUS
  Filled 2017-07-01 (×2): qty 2

## 2017-07-01 MED ORDER — LACTATED RINGERS IV SOLN
INTRAVENOUS | Status: DC | PRN
  Administered 2017-07-01: 08:00:00 via INTRAVENOUS

## 2017-07-01 MED ORDER — CHLORHEXIDINE GLUCONATE 4 % EX LIQD: 30.0000 mL | CUTANEOUS | Status: DC

## 2017-07-01 MED ORDER — AMIODARONE HCL IN DEXTROSE 360-4.14 MG/200ML-% IV SOLN
30.0000 mg/h | INTRAVENOUS | Status: DC
  Administered 2017-07-01: 30 mg/h via INTRAVENOUS
  Filled 2017-07-01: qty 200

## 2017-07-01 MED ORDER — LACTATED RINGERS IV SOLN
INTRAVENOUS | Status: DC
  Administered 2017-07-01 – 2017-07-04 (×2): via INTRAVENOUS

## 2017-07-01 MED ORDER — SODIUM CHLORIDE 0.9% FLUSH: 10.0000 mL | INTRAVENOUS | Status: DC | PRN

## 2017-07-01 MED ORDER — PANTOPRAZOLE SODIUM 40 MG PO TBEC
40.0000 mg | DELAYED_RELEASE_TABLET | Freq: Every day | ORAL | Status: DC
  Administered 2017-07-03 – 2017-07-09 (×7): 40 mg via ORAL
  Filled 2017-07-01 (×7): qty 1

## 2017-07-01 MED ORDER — AMIODARONE HCL IN DEXTROSE 360-4.14 MG/200ML-% IV SOLN
60.0000 mg/h | INTRAVENOUS | Status: DC
  Filled 2017-07-01: qty 200

## 2017-07-01 MED ORDER — CHLORHEXIDINE GLUCONATE CLOTH 2 % EX PADS
6.0000 | MEDICATED_PAD | Freq: Every day | CUTANEOUS | Status: DC
  Administered 2017-07-01 – 2017-07-07 (×7): 6 via TOPICAL

## 2017-07-01 MED ORDER — SODIUM CHLORIDE 0.9 % IV SOLN
20.0000 ug | Freq: Once | INTRAVENOUS | Status: AC
  Administered 2017-07-01: 20 ug via INTRAVENOUS
  Filled 2017-07-01: qty 5

## 2017-07-01 MED ORDER — PHENYLEPHRINE 40 MCG/ML (10ML) SYRINGE FOR IV PUSH (FOR BLOOD PRESSURE SUPPORT)
PREFILLED_SYRINGE | INTRAVENOUS | Status: AC
  Filled 2017-07-01: qty 10

## 2017-07-01 MED ORDER — AMIODARONE HCL IN DEXTROSE 360-4.14 MG/200ML-% IV SOLN
INTRAVENOUS | Status: DC | PRN
  Administered 2017-07-01: 60 mg/h via INTRAVENOUS

## 2017-07-01 MED ORDER — ORAL CARE MOUTH RINSE
15.0000 mL | OROMUCOSAL | Status: DC
  Administered 2017-07-01: 15 mL via OROMUCOSAL

## 2017-07-01 MED ORDER — SODIUM CHLORIDE 0.9 % IV SOLN
250.0000 mL | INTRAVENOUS | Status: DC
  Administered 2017-07-02: 250 mL via INTRAVENOUS

## 2017-07-01 MED ORDER — POTASSIUM CHLORIDE 10 MEQ/50ML IV SOLN
INTRAVENOUS | Status: AC
  Filled 2017-07-01: qty 50

## 2017-07-01 MED ORDER — MOMETASONE FURO-FORMOTEROL FUM 100-5 MCG/ACT IN AERO
2.0000 | INHALATION_SPRAY | Freq: Two times a day (BID) | RESPIRATORY_TRACT | Status: DC
  Administered 2017-07-02 – 2017-07-09 (×13): 2 via RESPIRATORY_TRACT
  Filled 2017-07-01 (×2): qty 8.8

## 2017-07-01 MED ORDER — SODIUM CHLORIDE 0.9 % IJ SOLN
INTRAMUSCULAR | Status: DC | PRN
  Administered 2017-07-01 (×3): 4 mL via TOPICAL

## 2017-07-01 MED ORDER — SODIUM CHLORIDE 0.9 % IV SOLN
INTRAVENOUS | Status: DC | PRN
  Administered 2017-07-01: 13:00:00 via INTRAVENOUS

## 2017-07-01 MED ORDER — METOPROLOL TARTRATE 12.5 MG HALF TABLET
12.5000 mg | ORAL_TABLET | Freq: Once | ORAL | Status: DC
  Filled 2017-07-01: qty 1

## 2017-07-01 MED ORDER — LACTATED RINGERS IV SOLN
INTRAVENOUS | Status: DC
  Administered 2017-07-01: 14:00:00 via INTRAVENOUS

## 2017-07-01 MED ORDER — SODIUM CHLORIDE 0.9 % IV SOLN
INTRAVENOUS | Status: DC
  Administered 2017-07-01: 14.6 [IU]/h via INTRAVENOUS
  Filled 2017-07-01 (×2): qty 1

## 2017-07-01 MED ORDER — SODIUM CHLORIDE 0.9% FLUSH
3.0000 mL | Freq: Two times a day (BID) | INTRAVENOUS | Status: DC
  Administered 2017-07-02 – 2017-07-03 (×4): 3 mL via INTRAVENOUS
  Administered 2017-07-04: 10 mL via INTRAVENOUS
  Administered 2017-07-04 – 2017-07-07 (×4): 3 mL via INTRAVENOUS

## 2017-07-01 MED ORDER — MORPHINE SULFATE (PF) 4 MG/ML IV SOLN
1.0000 mg | INTRAVENOUS | Status: AC | PRN
  Administered 2017-07-01: 4 mg via INTRAVENOUS
  Administered 2017-07-01: 2 mg via INTRAVENOUS
  Administered 2017-07-02: 4 mg via INTRAVENOUS
  Filled 2017-07-01 (×5): qty 1

## 2017-07-01 MED ORDER — HEPARIN SODIUM (PORCINE) 1000 UNIT/ML IJ SOLN
INTRAMUSCULAR | Status: DC | PRN
  Administered 2017-07-01: 2000 [IU] via INTRAVENOUS
  Administered 2017-07-01: 26000 [IU] via INTRAVENOUS
  Administered 2017-07-01: 2000 [IU] via INTRAVENOUS

## 2017-07-01 MED ORDER — ROCURONIUM BROMIDE 10 MG/ML (PF) SYRINGE
PREFILLED_SYRINGE | INTRAVENOUS | Status: AC
  Filled 2017-07-01: qty 10

## 2017-07-01 MED ORDER — PROTAMINE SULFATE 10 MG/ML IV SOLN
INTRAVENOUS | Status: DC | PRN
  Administered 2017-07-01: 300 mg via INTRAVENOUS

## 2017-07-01 MED ORDER — DEXTROSE 5 % IV SOLN
1.5000 g | Freq: Two times a day (BID) | INTRAVENOUS | Status: AC
  Administered 2017-07-01 – 2017-07-03 (×4): 1.5 g via INTRAVENOUS
  Filled 2017-07-01 (×4): qty 1.5

## 2017-07-01 MED ORDER — POTASSIUM CHLORIDE 10 MEQ/50ML IV SOLN
10.0000 meq | INTRAVENOUS | Status: AC
  Administered 2017-07-01 (×3): 10 meq via INTRAVENOUS
  Filled 2017-07-01 (×2): qty 50

## 2017-07-01 MED ORDER — AMIODARONE HCL IN DEXTROSE 360-4.14 MG/200ML-% IV SOLN
60.0000 mg/h | INTRAVENOUS | Status: AC
  Administered 2017-07-01: 60 mg/h via INTRAVENOUS

## 2017-07-01 MED ORDER — MONTELUKAST SODIUM 10 MG PO TABS
10.0000 mg | ORAL_TABLET | Freq: Every day | ORAL | Status: DC
  Administered 2017-07-02 – 2017-07-08 (×7): 10 mg via ORAL
  Filled 2017-07-01 (×7): qty 1

## 2017-07-01 MED ORDER — VANCOMYCIN HCL IN DEXTROSE 1-5 GM/200ML-% IV SOLN
1000.0000 mg | Freq: Once | INTRAVENOUS | Status: AC
  Administered 2017-07-01: 1000 mg via INTRAVENOUS
  Filled 2017-07-01: qty 200

## 2017-07-01 MED ORDER — INSULIN REGULAR BOLUS VIA INFUSION
0.0000 [IU] | Freq: Three times a day (TID) | INTRAVENOUS | Status: DC
  Filled 2017-07-01: qty 10

## 2017-07-01 MED ORDER — NOREPINEPHRINE BITARTRATE 1 MG/ML IV SOLN
2.0000 ug/min | INTRAVENOUS | Status: DC
  Administered 2017-07-01: 8 ug/min via INTRAVENOUS
  Administered 2017-07-01: 25 ug/min via INTRAVENOUS
  Administered 2017-07-02: 12 ug/min via INTRAVENOUS
  Administered 2017-07-03: 2.5 ug/min via INTRAVENOUS
  Filled 2017-07-01 (×6): qty 4

## 2017-07-01 MED ORDER — BISACODYL 10 MG RE SUPP: 10.0000 mg | Freq: Every day | RECTAL | Status: DC

## 2017-07-01 MED ORDER — MILRINONE LACTATE IN DEXTROSE 20-5 MG/100ML-% IV SOLN
0.3000 ug/kg/min | INTRAVENOUS | Status: DC
  Administered 2017-07-02: 0.25 ug/kg/min via INTRAVENOUS
  Administered 2017-07-03 – 2017-07-04 (×3): 0.3 ug/kg/min via INTRAVENOUS
  Filled 2017-07-01 (×5): qty 100

## 2017-07-01 MED ORDER — LACTATED RINGERS IV SOLN
INTRAVENOUS | Status: DC | PRN
Start: 2017-07-01 — End: 2017-07-01
  Administered 2017-07-01: 07:00:00 via INTRAVENOUS

## 2017-07-01 MED ORDER — GUAIFENESIN ER 600 MG PO TB12
600.0000 mg | ORAL_TABLET | Freq: Two times a day (BID) | ORAL | Status: DC
  Administered 2017-07-02 – 2017-07-09 (×15): 600 mg via ORAL
  Filled 2017-07-01 (×15): qty 1

## 2017-07-01 MED ORDER — ACETAMINOPHEN 650 MG RE SUPP
650.0000 mg | Freq: Once | RECTAL | Status: AC
  Administered 2017-07-01: 650 mg via RECTAL

## 2017-07-01 MED ORDER — ACETAMINOPHEN 160 MG/5ML PO SOLN: 1000.0000 mg | Freq: Four times a day (QID) | ORAL | Status: AC

## 2017-07-01 MED ORDER — SODIUM CHLORIDE 0.9 % IJ SOLN
INTRAMUSCULAR | Status: AC
  Filled 2017-07-01: qty 10

## 2017-07-01 MED ORDER — NITROGLYCERIN IN D5W 200-5 MCG/ML-% IV SOLN: 0.0000 ug/min | INTRAVENOUS | Status: DC

## 2017-07-01 MED ORDER — MILRINONE LACTATE IN DEXTROSE 20-5 MG/100ML-% IV SOLN
0.1250 ug/kg/min | INTRAVENOUS | Status: AC
  Administered 2017-07-01: 0.25 ug/kg/min via INTRAVENOUS
  Filled 2017-07-01: qty 100

## 2017-07-01 MED ORDER — FENTANYL CITRATE (PF) 250 MCG/5ML IJ SOLN
INTRAMUSCULAR | Status: AC
  Filled 2017-07-01: qty 5

## 2017-07-01 MED ORDER — CHLORHEXIDINE GLUCONATE 0.12 % MT SOLN
15.0000 mL | Freq: Once | OROMUCOSAL | Status: AC
  Administered 2017-07-01: 15 mL via OROMUCOSAL
  Filled 2017-07-01: qty 15

## 2017-07-01 MED ORDER — 0.9 % SODIUM CHLORIDE (POUR BTL) OPTIME
TOPICAL | Status: DC | PRN
  Administered 2017-07-01: 6000 mL

## 2017-07-01 MED ORDER — PROTAMINE SULFATE 10 MG/ML IV SOLN
INTRAVENOUS | Status: AC
  Filled 2017-07-01: qty 25

## 2017-07-01 MED ORDER — DOCUSATE SODIUM 100 MG PO CAPS
200.0000 mg | ORAL_CAPSULE | Freq: Every day | ORAL | Status: DC
  Administered 2017-07-02 – 2017-07-09 (×6): 200 mg via ORAL
  Filled 2017-07-01 (×7): qty 2

## 2017-07-01 MED ORDER — FENTANYL CITRATE (PF) 250 MCG/5ML IJ SOLN
INTRAMUSCULAR | Status: DC | PRN
  Administered 2017-07-01: 50 ug via INTRAVENOUS
  Administered 2017-07-01: 150 ug via INTRAVENOUS
  Administered 2017-07-01: 100 ug via INTRAVENOUS
  Administered 2017-07-01: 50 ug via INTRAVENOUS
  Administered 2017-07-01: 250 ug via INTRAVENOUS
  Administered 2017-07-01 (×4): 100 ug via INTRAVENOUS
  Administered 2017-07-01: 50 ug via INTRAVENOUS
  Administered 2017-07-01 (×2): 100 ug via INTRAVENOUS
  Administered 2017-07-01: 150 ug via INTRAVENOUS

## 2017-07-01 MED ORDER — FENTANYL CITRATE (PF) 250 MCG/5ML IJ SOLN
INTRAMUSCULAR | Status: AC
  Filled 2017-07-01: qty 25

## 2017-07-01 MED ORDER — MIDAZOLAM HCL 5 MG/5ML IJ SOLN
INTRAMUSCULAR | Status: DC | PRN
  Administered 2017-07-01 (×2): 2 mg via INTRAVENOUS
  Administered 2017-07-01 (×3): 1 mg via INTRAVENOUS
  Administered 2017-07-01: 3 mg via INTRAVENOUS

## 2017-07-01 MED ORDER — SODIUM CHLORIDE 0.9 % IV SOLN
INTRAVENOUS | Status: DC
  Administered 2017-07-01: 14:00:00 via INTRAVENOUS
  Administered 2017-07-04: 10 mL/h via INTRAVENOUS

## 2017-07-01 MED ORDER — MAGNESIUM SULFATE 4 GM/100ML IV SOLN
4.0000 g | Freq: Once | INTRAVENOUS | Status: AC
  Administered 2017-07-01: 4 g via INTRAVENOUS
  Filled 2017-07-01: qty 100

## 2017-07-01 MED ORDER — METOPROLOL TARTRATE 12.5 MG HALF TABLET
12.5000 mg | ORAL_TABLET | Freq: Two times a day (BID) | ORAL | Status: DC
  Administered 2017-07-02 – 2017-07-09 (×12): 12.5 mg via ORAL
  Filled 2017-07-01 (×14): qty 1

## 2017-07-01 MED ORDER — LACTATED RINGERS IV SOLN: 500.0000 mL | Freq: Once | INTRAVENOUS | Status: DC | PRN

## 2017-07-01 MED ORDER — ARTIFICIAL TEARS OPHTHALMIC OINT
TOPICAL_OINTMENT | OPHTHALMIC | Status: DC | PRN
  Administered 2017-07-01: 1 via OPHTHALMIC

## 2017-07-01 MED ORDER — ARTIFICIAL TEARS OPHTHALMIC OINT
TOPICAL_OINTMENT | OPHTHALMIC | Status: AC
  Filled 2017-07-01: qty 3.5

## 2017-07-01 MED ORDER — EPHEDRINE SULFATE 50 MG/ML IJ SOLN
INTRAMUSCULAR | Status: DC | PRN
  Administered 2017-07-01 (×3): 10 mg via INTRAVENOUS

## 2017-07-01 MED FILL — Heparin Sodium (Porcine) Inj 1000 Unit/ML: INTRAMUSCULAR | Qty: 30 | Status: AC

## 2017-07-01 MED FILL — Magnesium Sulfate Inj 50%: INTRAMUSCULAR | Qty: 10 | Status: AC

## 2017-07-01 MED FILL — Potassium Chloride Inj 2 mEq/ML: INTRAVENOUS | Qty: 5 | Status: AC

## 2017-07-01 SURGICAL SUPPLY — 96 items
ADAPTER CARDIO PERF ANTE/RETRO (ADAPTER) ×4 IMPLANT
BAG DECANTER FOR FLEXI CONT (MISCELLANEOUS) ×4 IMPLANT
BANDAGE ACE 4X5 VEL STRL LF (GAUZE/BANDAGES/DRESSINGS) ×4 IMPLANT
BANDAGE ACE 6X5 VEL STRL LF (GAUZE/BANDAGES/DRESSINGS) ×4 IMPLANT
BANDAGE ELASTIC 4 VELCRO ST LF (GAUZE/BANDAGES/DRESSINGS) ×4 IMPLANT
BASKET HEART  (ORDER IN 25'S) (MISCELLANEOUS) ×1
BASKET HEART (ORDER IN 25'S) (MISCELLANEOUS) ×1
BASKET HEART (ORDER IN 25S) (MISCELLANEOUS) ×2 IMPLANT
BLADE STERNUM SYSTEM 6 (BLADE) ×4 IMPLANT
BLADE SURG 11 STRL SS (BLADE) ×4 IMPLANT
BLADE SURG 12 STRL SS (BLADE) ×4 IMPLANT
BNDG GAUZE ELAST 4 BULKY (GAUZE/BANDAGES/DRESSINGS) ×4 IMPLANT
CANISTER SUCT 3000ML PPV (MISCELLANEOUS) ×4 IMPLANT
CANNULA GUNDRY RCSP 15FR (MISCELLANEOUS) ×4 IMPLANT
CATH CPB KIT VANTRIGT (MISCELLANEOUS) ×4 IMPLANT
CATH ROBINSON RED A/P 18FR (CATHETERS) ×12 IMPLANT
CATH THORACIC 36FR RT ANG (CATHETERS) ×4 IMPLANT
CLIP FOGARTY SPRING 6M (CLIP) ×4 IMPLANT
CLIP RETRACTION 3.0MM CORONARY (MISCELLANEOUS) ×4 IMPLANT
CLIP TI WIDE RED SMALL 24 (CLIP) ×12 IMPLANT
CRADLE DONUT ADULT HEAD (MISCELLANEOUS) ×4 IMPLANT
DERMABOND ADVANCED (GAUZE/BANDAGES/DRESSINGS) ×2
DERMABOND ADVANCED .7 DNX12 (GAUZE/BANDAGES/DRESSINGS) ×2 IMPLANT
DRAIN CHANNEL 32F RND 10.7 FF (WOUND CARE) ×4 IMPLANT
DRAPE CARDIOVASCULAR INCISE (DRAPES) ×2
DRAPE SLUSH/WARMER DISC (DRAPES) ×4 IMPLANT
DRAPE SRG 135X102X78XABS (DRAPES) ×2 IMPLANT
DRSG AQUACEL AG ADV 3.5X14 (GAUZE/BANDAGES/DRESSINGS) ×4 IMPLANT
ELECT BLADE 4.0 EZ CLEAN MEGAD (MISCELLANEOUS) ×4
ELECT BLADE 6.5 EXT (BLADE) ×4 IMPLANT
ELECT CAUTERY BLADE 6.4 (BLADE) ×4 IMPLANT
ELECT REM PT RETURN 9FT ADLT (ELECTROSURGICAL) ×8
ELECTRODE BLDE 4.0 EZ CLN MEGD (MISCELLANEOUS) ×2 IMPLANT
ELECTRODE REM PT RTRN 9FT ADLT (ELECTROSURGICAL) ×4 IMPLANT
FELT TEFLON 1X6 (MISCELLANEOUS) ×8 IMPLANT
GAUZE SPONGE 4X4 12PLY STRL (GAUZE/BANDAGES/DRESSINGS) ×8 IMPLANT
GAUZE SPONGE 4X4 12PLY STRL LF (GAUZE/BANDAGES/DRESSINGS) ×8 IMPLANT
GLOVE BIO SURGEON STRL SZ 6.5 (GLOVE) ×18 IMPLANT
GLOVE BIO SURGEON STRL SZ7.5 (GLOVE) ×12 IMPLANT
GLOVE BIO SURGEONS STRL SZ 6.5 (GLOVE) ×6
GOWN STRL REUS W/ TWL LRG LVL3 (GOWN DISPOSABLE) ×20 IMPLANT
GOWN STRL REUS W/TWL LRG LVL3 (GOWN DISPOSABLE) ×20
HEMOSTAT POWDER SURGIFOAM 1G (HEMOSTASIS) ×12 IMPLANT
HEMOSTAT SURGICEL 2X14 (HEMOSTASIS) ×4 IMPLANT
KIT BASIN OR (CUSTOM PROCEDURE TRAY) ×4 IMPLANT
KIT ROOM TURNOVER OR (KITS) ×4 IMPLANT
KIT SUCTION CATH 14FR (SUCTIONS) ×4 IMPLANT
KIT VASOVIEW HEMOPRO VH 3000 (KITS) ×8 IMPLANT
LEAD PACING MYOCARDI (MISCELLANEOUS) ×8 IMPLANT
MARKER GRAFT CORONARY BYPASS (MISCELLANEOUS) ×20 IMPLANT
NS IRRIG 1000ML POUR BTL (IV SOLUTION) ×24 IMPLANT
PACK OPEN HEART (CUSTOM PROCEDURE TRAY) ×4 IMPLANT
PAD ARMBOARD 7.5X6 YLW CONV (MISCELLANEOUS) ×8 IMPLANT
PAD ELECT DEFIB RADIOL ZOLL (MISCELLANEOUS) ×4 IMPLANT
PENCIL BUTTON HOLSTER BLD 10FT (ELECTRODE) ×4 IMPLANT
POWDER SURGICEL 3.0 GRAM (HEMOSTASIS) ×8 IMPLANT
PUNCH AORTIC ROTATE 4.0MM (MISCELLANEOUS) ×4 IMPLANT
SET CARDIOPLEGIA MPS 5001102 (MISCELLANEOUS) ×4 IMPLANT
SPONGE LAP 18X18 X RAY DECT (DISPOSABLE) ×8 IMPLANT
SPONGE LAP 4X18 X RAY DECT (DISPOSABLE) ×4 IMPLANT
SURGIFLO W/THROMBIN 8M KIT (HEMOSTASIS) ×4 IMPLANT
SUT BONE WAX W31G (SUTURE) ×4 IMPLANT
SUT MNCRL AB 4-0 PS2 18 (SUTURE) ×4 IMPLANT
SUT PROLENE 3 0 SH DA (SUTURE) IMPLANT
SUT PROLENE 3 0 SH1 36 (SUTURE) IMPLANT
SUT PROLENE 4 0 RB 1 (SUTURE) ×4
SUT PROLENE 4 0 SH DA (SUTURE) ×4 IMPLANT
SUT PROLENE 4-0 RB1 .5 CRCL 36 (SUTURE) ×4 IMPLANT
SUT PROLENE 5 0 C 1 36 (SUTURE) IMPLANT
SUT PROLENE 6 0 C 1 30 (SUTURE) ×24 IMPLANT
SUT PROLENE 6 0 CC (SUTURE) ×20 IMPLANT
SUT PROLENE 8 0 BV175 6 (SUTURE) IMPLANT
SUT PROLENE BLUE 7 0 (SUTURE) ×8 IMPLANT
SUT PROLENE POLY MONO (SUTURE) ×4 IMPLANT
SUT SILK  1 MH (SUTURE)
SUT SILK 1 MH (SUTURE) IMPLANT
SUT SILK 2 0 SH CR/8 (SUTURE) ×4 IMPLANT
SUT SILK 3 0 SH CR/8 (SUTURE) IMPLANT
SUT STEEL 6MS V (SUTURE) ×4 IMPLANT
SUT STEEL SZ 6 DBL 3X14 BALL (SUTURE) ×4 IMPLANT
SUT VIC AB 1 CTX 36 (SUTURE) ×4
SUT VIC AB 1 CTX36XBRD ANBCTR (SUTURE) ×4 IMPLANT
SUT VIC AB 2-0 CT1 27 (SUTURE) ×2
SUT VIC AB 2-0 CT1 TAPERPNT 27 (SUTURE) ×2 IMPLANT
SUT VIC AB 2-0 CTX 27 (SUTURE) IMPLANT
SUT VIC AB 3-0 X1 27 (SUTURE) IMPLANT
SUTURE E-PAK OPEN HEART (SUTURE) ×4 IMPLANT
SYSTEM SAHARA CHEST DRAIN ATS (WOUND CARE) ×4 IMPLANT
TAPE CLOTH SURG 4X10 WHT LF (GAUZE/BANDAGES/DRESSINGS) ×8 IMPLANT
TAPE PAPER 2X10 WHT MICROPORE (GAUZE/BANDAGES/DRESSINGS) ×4 IMPLANT
TOWEL GREEN STERILE (TOWEL DISPOSABLE) ×4 IMPLANT
TOWEL GREEN STERILE FF (TOWEL DISPOSABLE) ×4 IMPLANT
TRAY FOLEY SILVER 16FR TEMP (SET/KITS/TRAYS/PACK) ×4 IMPLANT
TUBING INSUFFLATION (TUBING) ×4 IMPLANT
UNDERPAD 30X30 (UNDERPADS AND DIAPERS) ×4 IMPLANT
WATER STERILE IRR 1000ML POUR (IV SOLUTION) ×8 IMPLANT

## 2017-07-01 NOTE — Progress Notes (Signed)
RT NOTE:  Pt vent setting returned to full support following ABG after weaning. Full support x2 hours and attempt to extubate @ 50% FiO2 per Dr. Donata ClayVan Trigt

## 2017-07-01 NOTE — Anesthesia Preprocedure Evaluation (Signed)
Anesthesia Evaluation  Patient identified by MRN, date of birth, ID band Patient awake    Reviewed: Allergy & Precautions, H&P , NPO status , Patient's Chart, lab work & pertinent test results  Airway Mallampati: II   Neck ROM: full    Dental   Pulmonary COPD, Current Smoker,    breath sounds clear to auscultation       Cardiovascular hypertension, + angina + CAD and + Peripheral Vascular Disease   Rhythm:regular Rate:Normal     Neuro/Psych    GI/Hepatic   Endo/Other  diabetes, Type 2  Renal/GU      Musculoskeletal   Abdominal   Peds  Hematology   Anesthesia Other Findings   Reproductive/Obstetrics                             Anesthesia Physical Anesthesia Plan  ASA: III  Anesthesia Plan: General   Post-op Pain Management:    Induction: Intravenous  PONV Risk Score and Plan: 2 and Ondansetron, Dexamethasone and Midazolam  Airway Management Planned: Oral ETT  Additional Equipment: Arterial line and TEE  Intra-op Plan:   Post-operative Plan: Post-operative intubation/ventilation  Informed Consent: I have reviewed the patients History and Physical, chart, labs and discussed the procedure including the risks, benefits and alternatives for the proposed anesthesia with the patient or authorized representative who has indicated his/her understanding and acceptance.     Plan Discussed with: CRNA, Anesthesiologist and Surgeon  Anesthesia Plan Comments:         Anesthesia Quick Evaluation

## 2017-07-01 NOTE — Anesthesia Procedure Notes (Signed)
Procedure Name: Intubation Date/Time: 07/01/2017 7:52 AM Performed by: Candis Shine Pre-anesthesia Checklist: Patient identified, Emergency Drugs available, Suction available and Patient being monitored Patient Re-evaluated:Patient Re-evaluated prior to inductionOxygen Delivery Method: Circle System Utilized Preoxygenation: Pre-oxygenation with 100% oxygen Intubation Type: IV induction Ventilation: Mask ventilation without difficulty and Oral airway inserted - appropriate to patient size Laryngoscope Size: Mac and 4 Grade View: Grade II Tube type: Oral Tube size: 8.0 mm Number of attempts: 1 Airway Equipment and Method: Stylet and Oral airway Placement Confirmation: ETT inserted through vocal cords under direct vision,  positive ETCO2 and breath sounds checked- equal and bilateral Secured at: 22 cm Tube secured with: Tape Dental Injury: Teeth and Oropharynx as per pre-operative assessment

## 2017-07-01 NOTE — Progress Notes (Signed)
The patient was examined and preop studies reviewed. There has been no change from the prior exam and the patient is ready for surgery.  plan CABG on L [place]

## 2017-07-01 NOTE — Procedures (Signed)
Extubation Procedure Note Pt extubated. Pt completed Rapid Wean without event. NIF -40, VC 2.5L. Cuff Leak +, Extubated to 55% VM. No stridor. Pt speaks name/location  Patient Details:   Name: Brendan [name] DOB: 08-Apr-1951 MRN: [id number]   Airway Documentation:     Evaluation  O2 sats: stable throughout Complications: No apparent complications Patient did tolerate procedure well. Bilateral Breath Sounds: Diminished   Yes  Elmer PickerClifton, Geovanna Simko D 07/01/2017, 9:33 PM

## 2017-07-01 NOTE — Brief Op Note (Signed)
07/01/2017  12:13 PM  PATIENT:  Brendan [name]  66 y.o. male  PRE-OPERATIVE DIAGNOSIS:  Coronary artery diease  POST-OPERATIVE DIAGNOSIS:  Coronary artery diease  PROCEDURE:  TRANSESOPHAGEAL ECHOCARDIOGRAM (TEE), MEDIAN STERNOTOMY for CORONARY ARTERY BYPASS GRAFTING (CABG) x4, (LIMA to LAD, SVG to DIAGONAL, SVG to OM, SVG to PDA) using left mammary artery and right greater saphenous vein harvested endoscopically   SURGEON:  Surgeon(s) and Role:    Kerin Perna* Van Trigt, Peter, MD - Primary  PHYSICIAN ASSISTANT: Doree Fudgeonielle Zimmerman PA-C  ASSISTANTS: Maudry DiegoMichele Gengler RNFA  ANESTHESIA:   general  EBL:  Total I/O In: 2300 [I.V.:2300] Out: 1450 [Urine:1450]  BLOOD ADMINISTERED:Two  CC PRBC and two FFP  DRAINS: Chest tubes placed in the mediastinal and pleural spaces   COUNTS CORRECT:  YES  DICTATION: .Dragon Dictation  PLAN OF CARE: Admit to inpatient   PATIENT DISPOSITION:  ICU - intubated and hemodynamically stable.   Delay start of Pharmacological VTE agent (>24hrs) due to surgical blood loss or risk of bleeding: yes  BASELINE WEIGHT: 84 kg

## 2017-07-01 NOTE — Anesthesia Procedure Notes (Signed)
Central Venous Catheter Insertion Performed by: Kipp BroodJOSLIN, Zaccai Chavarin, anesthesiologist Start/End7/01/2017 7:10 AM, 07/01/2017 7:15 AM Preanesthetic checklist: patient identified, IV checked, site marked, risks and benefits discussed, surgical consent, monitors and equipment checked, pre-op evaluation and timeout performed Position: Trendelenburg Catheter size: 9 Fr PA cath was placed.Procedure performed using ultrasound guided technique. Ultrasound Notes:anatomy identified, needle tip was noted to be adjacent to the nerve/plexus identified and no ultrasound evidence of intravascular and/or intraneural injection Attempts: 1 Following insertion, line sutured, Biopatch and dressing applied. Patient tolerated the procedure well with no immediate complications.

## 2017-07-01 NOTE — Progress Notes (Signed)
Rapid wean protocol initiated at 1819.

## 2017-07-01 NOTE — Progress Notes (Signed)
      [address]       [place] [id number]             [phone]       S/p CABG x 4  Intubated, sedated  BP 104/62   Pulse 85   Temp 97.7 F (36.5 C)   Resp 18   Wt 184 lb (83.5 kg)   SpO2 99%   BMI 23.95 kg/m    Intake/Output Summary (Last 24 hours) at 07/01/17 1823 Last data filed at 07/01/17 1723  Gross per 24 hour  Intake          6909.14 ml  Output             4845 ml  Net          2064.14 ml  K= 3.4- supplemented Hct= 33  Doing well early postop  Viviann SpareSteven C. Dorris FetchHendrickson, MD [facility] [phone]

## 2017-07-01 NOTE — Anesthesia Procedure Notes (Signed)
Arterial Line Insertion Start/End7/01/2017 6:45 AM Performed by: Jed LimerickHARDER, Leela Vanbrocklin S, CRNA  Patient location: Pre-op. Preanesthetic checklist: patient identified, IV checked, site marked, risks and benefits discussed, surgical consent, monitors and equipment checked, pre-op evaluation, timeout performed and anesthesia consent Lidocaine 1% used for infiltration and patient sedated Left, radial was placed Catheter size: 20 G Hand hygiene performed   Attempts: 1 Procedure performed without using ultrasound guided technique. Following insertion, dressing applied and Biopatch. Post procedure assessment: normal  Patient tolerated the procedure well with no immediate complications.

## 2017-07-01 NOTE — Progress Notes (Signed)
  Echocardiogram Echocardiogram Transesophageal has been performed.  Arvil ChacoFoster, Shwanda Soltis 07/01/2017, 9:58 AM

## 2017-07-01 NOTE — Transfer of Care (Signed)
Immediate Anesthesia Transfer of Care Note  Patient: Brendan [name]  Procedure(s) Performed: Procedure(s): CORONARY ARTERY BYPASS GRAFTING (CABG) x4, using left mammary artery and right greater saphenous vein harvested endoscopically (N/A) TRANSESOPHAGEAL ECHOCARDIOGRAM (TEE) (N/A)  Patient Location: ICU  Anesthesia Type:General  Level of Consciousness: Patient remains intubated per anesthesia plan  Airway & Oxygen Therapy: Patient remains intubated per anesthesia plan and Patient placed on Ventilator (see vital sign flow sheet for setting)  Post-op Assessment: Report given to RN and Post -op Vital signs reviewed and stable  Post vital signs: Reviewed and stable  Last Vitals:  Vitals:   07/01/17 0702 07/01/17 1408  BP:  105/60  Pulse: (!) 52 84  Resp: 13 18  Temp:      Last Pain: There were no vitals filed for this visit.       Complications: No apparent anesthesia complications

## 2017-07-01 NOTE — Anesthesia Postprocedure Evaluation (Signed)
Anesthesia Post Note  Patient: Brendan [name]  Procedure(s) Performed: Procedure(s) (LRB): CORONARY ARTERY BYPASS GRAFTING (CABG) x4, using left mammary artery and right greater saphenous vein harvested endoscopically (N/A) TRANSESOPHAGEAL ECHOCARDIOGRAM (TEE) (N/A)     Patient location during evaluation: SICU Anesthesia Type: General Level of consciousness: sedated Pain management: pain level controlled Vital Signs Assessment: post-procedure vital signs reviewed and stable Respiratory status: patient remains intubated per anesthesia plan Cardiovascular status: stable Anesthetic complications: no    Last Vitals:  Vitals:   07/01/17 1415 07/01/17 1516  BP: 101/69 104/62  Pulse: 85   Resp: 18   Temp:      Last Pain: There were no vitals filed for this visit.               Lanayah Gartley S

## 2017-07-02 ENCOUNTER — Inpatient Hospital Stay (HOSPITAL_COMMUNITY): Payer: Medicare Other

## 2017-07-02 ENCOUNTER — Encounter (HOSPITAL_COMMUNITY): Payer: Self-pay | Admitting: Cardiothoracic Surgery

## 2017-07-02 LAB — POCT I-STAT 3, ART BLOOD GAS (G3+)
Acid-base deficit: 1 mmol/L (ref 0.0–2.0)
Acid-base deficit: 2 mmol/L (ref 0.0–2.0)
Acid-base deficit: 3 mmol/L — ABNORMAL HIGH (ref 0.0–2.0)
Acid-base deficit: 3 mmol/L — ABNORMAL HIGH (ref 0.0–2.0)
Bicarbonate: 21.6 mmol/L (ref 20.0–28.0)
Bicarbonate: 22 mmol/L (ref 20.0–28.0)
Bicarbonate: 22.2 mmol/L (ref 20.0–28.0)
Bicarbonate: 22.8 mmol/L (ref 20.0–28.0)
O2 Saturation: 88 %
O2 Saturation: 91 %
O2 Saturation: 92 %
O2 Saturation: 94 %
Patient temperature: 36.5
Patient temperature: 36.9
Patient temperature: 98.1
TCO2: 23 mmol/L (ref 0–100)
TCO2: 23 mmol/L (ref 0–100)
TCO2: 23 mmol/L (ref 0–100)
TCO2: 24 mmol/L (ref 0–100)
pCO2 arterial: 29.7 mmHg — ABNORMAL LOW (ref 32.0–48.0)
pCO2 arterial: 35.2 mmHg (ref 32.0–48.0)
pCO2 arterial: 36 mmHg (ref 32.0–48.0)
pCO2 arterial: 37.4 mmHg (ref 32.0–48.0)
pH, Arterial: 7.381 (ref 7.350–7.450)
pH, Arterial: 7.387 (ref 7.350–7.450)
pH, Arterial: 7.418 (ref 7.350–7.450)
pH, Arterial: 7.476 — ABNORMAL HIGH (ref 7.350–7.450)
pO2, Arterial: 55 mmHg — ABNORMAL LOW (ref 83.0–108.0)
pO2, Arterial: 55 mmHg — ABNORMAL LOW (ref 83.0–108.0)
pO2, Arterial: 65 mmHg — ABNORMAL LOW (ref 83.0–108.0)
pO2, Arterial: 68 mmHg — ABNORMAL LOW (ref 83.0–108.0)

## 2017-07-02 LAB — GLUCOSE, CAPILLARY
Glucose-Capillary: 102 mg/dL — ABNORMAL HIGH (ref 65–99)
Glucose-Capillary: 107 mg/dL — ABNORMAL HIGH (ref 65–99)
Glucose-Capillary: 108 mg/dL — ABNORMAL HIGH (ref 65–99)
Glucose-Capillary: 109 mg/dL — ABNORMAL HIGH (ref 65–99)
Glucose-Capillary: 110 mg/dL — ABNORMAL HIGH (ref 65–99)
Glucose-Capillary: 111 mg/dL — ABNORMAL HIGH (ref 65–99)
Glucose-Capillary: 124 mg/dL — ABNORMAL HIGH (ref 65–99)
Glucose-Capillary: 153 mg/dL — ABNORMAL HIGH (ref 65–99)
Glucose-Capillary: 159 mg/dL — ABNORMAL HIGH (ref 65–99)
Glucose-Capillary: 191 mg/dL — ABNORMAL HIGH (ref 65–99)
Glucose-Capillary: 86 mg/dL (ref 65–99)
Glucose-Capillary: 89 mg/dL (ref 65–99)
Glucose-Capillary: 90 mg/dL (ref 65–99)
Glucose-Capillary: 92 mg/dL (ref 65–99)
Glucose-Capillary: 92 mg/dL (ref 65–99)
Glucose-Capillary: 98 mg/dL (ref 65–99)
Glucose-Capillary: 99 mg/dL (ref 65–99)

## 2017-07-02 LAB — COOXEMETRY PANEL
CARBOXYHEMOGLOBIN: 0.8 % (ref 0.5–1.5)
Methemoglobin: 1.3 % (ref 0.0–1.5)
O2 Saturation: 55.3 %
Total hemoglobin: 9.7 g/dL — ABNORMAL LOW (ref 12.0–16.0)

## 2017-07-02 LAB — POCT I-STAT, CHEM 8
BUN: 9 mg/dL (ref 6–20)
Calcium, Ion: 1.08 mmol/L — ABNORMAL LOW (ref 1.15–1.40)
Chloride: 106 mmol/L (ref 101–111)
Creatinine, Ser: 0.7 mg/dL (ref 0.61–1.24)
Glucose, Bld: 149 mg/dL — ABNORMAL HIGH (ref 65–99)
HCT: 22 % — ABNORMAL LOW (ref 39.0–52.0)
Hemoglobin: 7.5 g/dL — ABNORMAL LOW (ref 13.0–17.0)
Potassium: 3.7 mmol/L (ref 3.5–5.1)
Sodium: 140 mmol/L (ref 135–145)
TCO2: 22 mmol/L (ref 0–100)

## 2017-07-02 LAB — CBC
HCT: 26.3 % — ABNORMAL LOW (ref 39.0–52.0)
HEMATOCRIT: 27.8 % — AB (ref 39.0–52.0)
Hemoglobin: 9.8 g/dL — ABNORMAL LOW (ref 13.0–17.0)
Hemoglobin: 9.1 g/dL — ABNORMAL LOW (ref 13.0–17.0)
MCH: 30.9 pg (ref 26.0–34.0)
MCH: 30.6 pg (ref 26.0–34.0)
MCHC: 35.3 g/dL (ref 30.0–36.0)
MCHC: 34.6 g/dL (ref 30.0–36.0)
MCV: 87.7 fL (ref 78.0–100.0)
MCV: 88.6 fL (ref 78.0–100.0)
PLATELETS: 89 10*3/uL — AB (ref 150–400)
Platelets: 109 10*3/uL — ABNORMAL LOW (ref 150–400)
RBC: 3.17 MIL/uL — AB (ref 4.22–5.81)
RBC: 2.97 MIL/uL — ABNORMAL LOW (ref 4.22–5.81)
RDW: 14.1 % (ref 11.5–15.5)
RDW: 14.8 % (ref 11.5–15.5)
WBC: 14.8 10*3/uL — AB (ref 4.0–10.5)
WBC: 14.5 10*3/uL — AB (ref 4.0–10.5)

## 2017-07-02 LAB — CREATININE, SERUM: Creatinine, Ser: 0.84 mg/dL (ref 0.61–1.24)

## 2017-07-02 LAB — BLOOD GAS, ARTERIAL
Acid-base deficit: 0.5 mmol/L (ref 0.0–2.0)
Bicarbonate: 23.4 mmol/L (ref 20.0–28.0)
FIO2: 100
O2 Saturation: 93.1 %
Patient temperature: 98.6
pCO2 arterial: 37.2 mmHg (ref 32.0–48.0)
pH, Arterial: 7.416 (ref 7.350–7.450)
pO2, Arterial: 67.2 mmHg — ABNORMAL LOW (ref 83.0–108.0)

## 2017-07-02 LAB — PREPARE FRESH FROZEN PLASMA
Unit division: 0
Unit division: 0

## 2017-07-02 LAB — BASIC METABOLIC PANEL
ANION GAP: 6 (ref 5–15)
BUN: 8 mg/dL (ref 6–20)
CHLORIDE: 110 mmol/L (ref 101–111)
CO2: 22 mmol/L (ref 22–32)
Calcium: 7.3 mg/dL — ABNORMAL LOW (ref 8.9–10.3)
Creatinine, Ser: 0.67 mg/dL (ref 0.61–1.24)
GFR calc Af Amer: >60 mL/min (ref >60)
GFR calc non Af Amer: >60 mL/min (ref >60)
GLUCOSE: 95 mg/dL (ref 65–99)
POTASSIUM: 3.7 mmol/L (ref 3.5–5.1)
Sodium: 138 mmol/L (ref 135–145)

## 2017-07-02 LAB — BPAM FFP
Blood Product Expiration Date: 201807052359
Blood Product Expiration Date: 201807052359
ISSUE DATE / TIME: 201807021128
ISSUE DATE / TIME: 201807021128
Unit Type and Rh: 6200
Unit Type and Rh: 6200

## 2017-07-02 LAB — MAGNESIUM
MAGNESIUM: 2.3 mg/dL (ref 1.7–2.4)
Magnesium: 2.4 mg/dL (ref 1.7–2.4)

## 2017-07-02 MED ORDER — POTASSIUM CHLORIDE 10 MEQ/50ML IV SOLN
10.0000 meq | INTRAVENOUS | Status: AC
  Administered 2017-07-02 (×3): 10 meq via INTRAVENOUS
  Filled 2017-07-02 (×3): qty 50

## 2017-07-02 MED ORDER — LIDOCAINE HCL (PF) 1 % IJ SOLN
INTRAMUSCULAR | Status: AC
  Administered 2017-07-02: 30 mL via INTRADERMAL
  Filled 2017-07-02: qty 30

## 2017-07-02 MED ORDER — FENTANYL CITRATE (PF) 100 MCG/2ML IJ SOLN
25.0000 ug | Freq: Once | INTRAMUSCULAR | Status: AC
  Administered 2017-07-02: 25 ug via INTRAVENOUS

## 2017-07-02 MED ORDER — AMIODARONE HCL 200 MG PO TABS
200.0000 mg | ORAL_TABLET | Freq: Two times a day (BID) | ORAL | Status: DC
  Administered 2017-07-02 – 2017-07-09 (×15): 200 mg via ORAL
  Filled 2017-07-02 (×15): qty 1

## 2017-07-02 MED ORDER — INSULIN ASPART 100 UNIT/ML ~~LOC~~ SOLN
4.0000 [IU] | Freq: Three times a day (TID) | SUBCUTANEOUS | Status: DC
  Administered 2017-07-02 – 2017-07-05 (×6): 4 [IU] via SUBCUTANEOUS

## 2017-07-02 MED ORDER — FUROSEMIDE 10 MG/ML IJ SOLN
20.0000 mg | Freq: Two times a day (BID) | INTRAMUSCULAR | Status: DC
  Administered 2017-07-02 – 2017-07-04 (×5): 20 mg via INTRAVENOUS
  Filled 2017-07-02 (×5): qty 2

## 2017-07-02 MED ORDER — FENTANYL CITRATE (PF) 100 MCG/2ML IJ SOLN: 25.0000 ug | Freq: Once | INTRAMUSCULAR | Status: DC

## 2017-07-02 MED ORDER — INSULIN ASPART 100 UNIT/ML ~~LOC~~ SOLN
0.0000 [IU] | SUBCUTANEOUS | Status: DC
  Administered 2017-07-02: 4 [IU] via SUBCUTANEOUS
  Administered 2017-07-02 (×2): 2 [IU] via SUBCUTANEOUS
  Administered 2017-07-03 (×2): 4 [IU] via SUBCUTANEOUS
  Administered 2017-07-03 (×2): 2 [IU] via SUBCUTANEOUS
  Administered 2017-07-03: 4 [IU] via SUBCUTANEOUS
  Administered 2017-07-04 (×2): 2 [IU] via SUBCUTANEOUS

## 2017-07-02 MED ORDER — LIDOCAINE HCL (PF) 1 % IJ SOLN
30.0000 mL | Freq: Once | INTRAMUSCULAR | Status: AC
  Administered 2017-07-02: 30 mL via INTRADERMAL

## 2017-07-02 MED ORDER — INSULIN DETEMIR 100 UNIT/ML ~~LOC~~ SOLN
14.0000 [IU] | Freq: Two times a day (BID) | SUBCUTANEOUS | Status: DC
  Administered 2017-07-02 – 2017-07-03 (×4): 14 [IU] via SUBCUTANEOUS
  Filled 2017-07-02 (×5): qty 0.14

## 2017-07-02 MED ORDER — FENTANYL CITRATE (PF) 100 MCG/2ML IJ SOLN
INTRAMUSCULAR | Status: AC
  Filled 2017-07-02: qty 2

## 2017-07-02 MED ORDER — ORAL CARE MOUTH RINSE
15.0000 mL | Freq: Two times a day (BID) | OROMUCOSAL | Status: DC
  Administered 2017-07-02 – 2017-07-08 (×9): 15 mL via OROMUCOSAL

## 2017-07-02 MED ORDER — POTASSIUM CHLORIDE 10 MEQ/50ML IV SOLN
INTRAVENOUS | Status: AC
  Filled 2017-07-02: qty 50

## 2017-07-02 MED ORDER — DOPAMINE-DEXTROSE 3.2-5 MG/ML-% IV SOLN
2.5000 ug/kg/min | INTRAVENOUS | Status: DC
  Administered 2017-07-02: 2.5 ug/kg/min via INTRAVENOUS
  Filled 2017-07-02 (×2): qty 250

## 2017-07-02 MED ORDER — POTASSIUM CHLORIDE 10 MEQ/50ML IV SOLN
10.0000 meq | INTRAVENOUS | Status: AC
  Administered 2017-07-02 (×2): 10 meq via INTRAVENOUS
  Filled 2017-07-02: qty 50

## 2017-07-02 NOTE — Op Note (Signed)
NAMESIMS, LADAY NO.:  [id number]  MEDICAL RECORD NO.:  [id number]  LOCATION:  2H26C                        FACILITY:  [facility]  PHYSICIAN:  Kerin Perna, M.D.  DATE OF BIRTH:  Mar 23, 1951  DATE OF PROCEDURE:  07/01/2017 DATE OF DISCHARGE:                              OPERATIVE REPORT   OPERATION: 1. Coronary artery bypass grafting x4 (left internal mammary artery to     left anterior descending, saphenous vein graft to second diagonal,     saphenous vein graft to obtuse marginal, saphenous vein graft to     posterior descending). 2. Endoscopic harvest of right leg greater saphenous vein.  PREOPERATIVE DIAGNOSES:  Severe 3-vessel coronary artery disease, progressive class 3 angina, mild left ventricular dysfunction.  POSTOPERATIVE DIAGNOSES:  Severe 3-vessel coronary artery disease, progressive class 3 angina, mild left ventricular dysfunction.  SURGEON:  Kerin Perna, M.D.  ASSISTANT:  Doree Fudge, PA-C.  ANESTHESIA:  General.  INDICATIONS:  The patient was referred to my office for evaluation of severe 3-vessel coronary artery disease after undergoing outpatient cardiac catheterization for progressive dyspnea on exertion and exertional chest pain.  He was found to have severe 3-vessel coronary artery disease.  He also has severe COPD with active smoking.  He is a diabetic.  I reviewed the coronary catheterization films and his echo and discussed the procedure with the patient and his family.  I discussed the indications and expected benefits of coronary bypass grafting for treatment of his severe 3-vessel coronary artery disease. I discussed the procedure including the use of general anesthesia and cardiopulmonary bypass, the location of the surgical incisions, and the expected postoperative hospital recovery.  I reviewed to the patient and his family the risks to him of CABG for treatment of his CAD, including risk of stroke, bleeding  requiring blood transfusion, postoperative arrhythmias, postoperative pulmonary problems including pleural effusion, postoperative wound infection, postoperative renal failure, and death.  The patient understood that he would be at increased risk for the surgery because of his severe COPD, active smoking, and a diffusion capacity on PFTs of only 28% of predicted.  After reviewing these issues, he demonstrated his understanding and agreed to proceed with surgery under what I felt was an informed consent.  OPERATIVE FINDINGS: 1. Severely calcified coronary vessels, suboptimal but graftable     targets. 2. Adequate conduit. 3. Severe chronic obstructive pulmonary disease with hyperinflated     lungs, several thin-walled bullae and evidence of recent injury     with acute inflammatory changes of his lung parenchyma. 4. Preserved LV systolic function after separation from     cardiopulmonary bypass by TEE.  OPERATIVE PROCEDURE:  The patient was brought to the operating room and placed supine on the operating table.  General anesthesia was induced under invasive hemodynamic monitoring.  A transesophageal echo probe was placed by the Anesthesia team.  The patient was prepped and draped as a sterile field.  A proper time-out was performed.  A sternal incision was made as the saphenous vein was harvested endoscopically from the right leg.  The left internal mammary artery was harvested as a pedicle graft from its origin at  the subclavian vessels.  This was tedious because the lungs were so hyperinflated.  Exposure of the chest wall was difficult and the ventilator management had to be customized.  The sternal retractor was placed, and the pericardium was opened and suspended.  Pursestrings were placed in the ascending aorta and right atrium, and heparin was administered.  When the ACT was documented as being therapeutic, the patient was cannulated and placed on cardiopulmonary bypass.  The  coronaries were inspected and the target vessel was dissected out.  The LAD was diffusely diseased.  The second diagonal was a small 1-mm vessel but was graftable.  The OM was an intramyocardial and heavily calcified vessel.  The posterior descending was also heavily calcified proximally.  The mammary artery and vein grafts were prepared for the distal anastomoses and cardioplegia cannulas were placed both antegrade aortic and retrograde coronary sinus cardioplegia.  The patient was cooled to 32 degrees and aortic crossclamp was applied.  One liter of cold blood cardioplegia was delivered in split doses between the antegrade aortic and retrograde coronary sinus catheters.  There was good cardioplegic arrest and septal temperature dropped less than 14 degrees.  Cardioplegia was delivered every 20 minutes.  The distal coronary anastomoses were performed.  The first distal anastomosis was to the proximal posterior descending.  There was a proximal 90% calcified stenosis.  A reverse saphenous vein was sewn end- to-side to this 1.5-mm vessel and there was good flow through the graft. Cardioplegia was redosed.  The second distal anastomosis was to the OM branch of the circumflex. This was a 1.5-mm vessel with proximal 90% calcified stenosis.  A reverse saphenous vein was sewn end-to-side with running 7-0 Prolene with good flow through the graft.  Cardioplegia was redosed.  The third distal anastomosis was to the second diagonal branch to LAD. This was a smaller vessel, heavily calcified proximally, so the anastomosis was placed distal to the heavy calcification.  It was a 1.0- mm vessel.  A reverse saphenous vein was sewn end-to-side with running 7- 0 Prolene.  There was good flow through the graft.  Cardioplegia was redosed.  The fourth distal anastomosis was to the distal third of the LAD.  A segment with less calcium was identified and the vessel was opened cleanly.  It was a 1.5-mm  vessel.  The left IMA pedicle was brought through an opening in the left lateral pericardium, brought down onto the LAD and sewn end-to-side with running 8-0 Prolene.  There was good flow through the anastomosis after briefly releasing the pedicle bulldog in the mammary artery.  The bulldog was reapplied and the pedicle was secured to the epicardium with 6-0 Prolene.  Cardioplegia was redosed.  While the crossclamp was still in place, 3 proximal vein anastomoses were performed on the ascending aorta with a 4.5-mm punch running 6-0 Prolene.  Prior to tying down the final proximal anastomosis, air was vented from the coronaries and the left side of the heart using a dose of retrograde warm blood cardioplegia and the usual de-airing maneuvers on bypass.  The crossclamp was removed.  The vein grafts were de-aired and opened.  Each had good flow and hemostasis was documented at the proximal and distal sites.  The cardioplegia cannulas were removed.  The patient was rewarmed and reperfused.  Temporary pacing wires were applied.  The lungs were expanded.  The ventilator was resumed.  The patient was weaned off cardiopulmonary bypass, on low-dose milrinone.  He has also been placed on  amiodarone for intraoperative for rapid atrial fibrillation.  He weaned off cardiopulmonary bypass without difficulty.  Cardiac hemodynamics were stable.  Oxygenation was adequate.  Protamine was administered and the cannulas were removed.  There was no adverse reaction to the protamine.  Hemostasis was difficult to obtain due to diffuse oozing consistent with coagulopathy.  The patient was given FFP with improvement in coagulation.  The superior pericardial fat was closed over the aorta.  Anterior mediastinal and left pleural chest tubes were brought out through separate incisions.  The sternum was closed with wire.  The pectoralis fascia was closed in a running #1 Vicryl.  The subcu and skin layers were closed  in running Vicryl. Sterile dressings were applied.  Total cardiopulmonary bypass time was 134 minutes.     Kerin Perna, M.D.     PV/MEDQ  D:  07/02/2017  T:  07/02/2017  Job:  [id number]  cc:   Yvonne Kendall, M.D.

## 2017-07-02 NOTE — Progress Notes (Signed)
1 Day Post-Op Procedure(s) (LRB): CORONARY ARTERY BYPASS GRAFTING (CABG) x4, using left mammary artery and right greater saphenous vein harvested endoscopically (N/A) TRANSESOPHAGEAL ECHOCARDIOGRAM (TEE) (N/A) Subjective: CABG with severe COPD Extubated but am CZXR with R 30% pntx- will place chest tube Nsr, CI > 2.0 Objective: Vital signs in last 24 hours: Temp:  [96.4 F (35.8 C)-99.5 F (37.5 C)] 99.5 F (37.5 C) (07/03 0700) Pulse Rate:  [59-88] 80 (07/03 0700) Cardiac Rhythm: A-V Sequential paced;Normal sinus rhythm (07/03 0400) Resp:  [9-24] 14 (07/03 0700) BP: (80-121)/(57-79) 112/65 (07/03 0700) SpO2:  [92 %-100 %] 99 % (07/03 0700) Arterial Line BP: (92-160)/(49-67) 125/54 (07/03 0700) FiO2 (%):  [40 %-50 %] 50 % (07/02 2000) Weight:  [194 lb 10.7 oz (88.3 kg)] 194 lb 10.7 oz (88.3 kg) (07/03 0500)  Hemodynamic parameters for last 24 hours: PAP: (19-36)/(3-23) 20/4 CO:  [4.2 L/min-6.4 L/min] 5.8 L/min CI:  [2 L/min/m2-3.1 L/min/m2] 2.8 L/min/m2  Intake/Output from previous day: 07/02 0701 - 07/03 0700 In: 9567.4 [I.V.:6170.4; ZOXWR:6045Blood:1667; NG/GT:30; IV Piggyback:1700] Out: 6170 [Urine:4150; Blood:1420; Chest Tube:600] Intake/Output this shift: No intake/output data recorded.       Exam    General- alert and comfortable   Lungs-coarse BS, wheezes   Cor- regular rate and rhythm, no murmur , gallop   Abdomen- soft, non-tender   Extremities - warm, non-tender, minimal edema   Neuro- oriented, appropriate, no focal weakness   Lab Results:  Recent Labs  07/01/17 2007 07/01/17 2012 07/02/17 0357  WBC 19.8*  --  14.8*  HGB 10.2* 9.5* 9.8*  HCT 29.9* 28.0* 27.8*  PLT 124*  --  109*   BMET:  Recent Labs  07/01/17 0611  07/01/17 2012 07/02/17 0357  NA 140  < > 143 138  K 3.5  < > 3.7 3.7  CL 108  < > 105 110  CO2 24  --   --  22  GLUCOSE 112*  < > 160* 95  BUN 14  < > 10 8  CREATININE 0.83  < > 0.70 0.67  CALCIUM 8.9  --   --  7.3*  < > = values in  this interval not displayed.  PT/INR: No results for input(s): LABPROT, INR in the last 72 hours. ABG    Component Value Date/Time   PHART 7.416 07/02/2017 0415   HCO3 23.4 07/02/2017 0415   TCO2 24 07/02/2017 0004   ACIDBASEDEF 0.5 07/02/2017 0415   O2SAT 55.3 07/02/2017 0420   CBG (last 3)   Recent Labs  07/02/17 0307 07/02/17 0516 07/02/17 0632  GLUCAP 92 111* 107*    Assessment/Plan: S/P Procedure(s) (LRB): CORONARY ARTERY BYPASS GRAFTING (CABG) x4, using left mammary artery and right greater saphenous vein harvested endoscopically (N/A) TRANSESOPHAGEAL ECHOCARDIOGRAM (TEE) (N/A) Chest tube - R PNTX OOB to chair diuresis amiodarone for intraop rapid afib  LOS: 1 day    Brendan [name] 07/02/2017

## 2017-07-02 NOTE — Care Management Note (Signed)
Case Management Note Donn PieriniKristi Kamea Dacosta RN, BSN Unit 2W-Case Manager-- 2H coverage [phone]  Patient Details  Name: Brendan PrimusLarry [name] MRN: [id number] Date of Birth: [birth year]  Subjective/Objective:  Pt admitted s/p CABGx4 on 07/01/17                  Action/Plan: PTA pt lived at home- CM to follow   Expected Discharge Date:                  Expected Discharge Plan:     In-House Referral:     Discharge planning Services  CM Consult  Post Acute Care Choice:    Choice offered to:     DME Arranged:    DME Agency:     HH Arranged:    HH Agency:     Status of Service:  In process, will continue to follow  If discussed at Long Length of Stay Meetings, dates discussed:    Discharge Disposition:   Additional Comments:  Darrold SpanWebster, Amyrah Pinkhasov Hall, RN 07/02/2017, 11:58 AM

## 2017-07-02 NOTE — Progress Notes (Signed)
Patient ID: Brendan [name], male   DOB: 08/11/1951, 66 y.o.   MRN: [id number]  SICU Evening Rounds:  Hemodynamically stable in sinus rhythm on milrinone 0.25 and levophed.  sats low 90's on 55% venturi mask  Good urine output  BMET    Component Value Date/Time   NA 140 07/02/2017 1642   NA 144 05/30/2017 1359   K 3.7 07/02/2017 1642   CL 106 07/02/2017 1642   CO2 22 07/02/2017 0357   GLUCOSE 149 (H) 07/02/2017 1642   BUN 9 07/02/2017 1642   BUN 15 05/30/2017 1359   CREATININE 0.70 07/02/2017 1642   CALCIUM 7.3 (L) 07/02/2017 0357   GFRNONAA >60 07/02/2017 1638   GFRAA >60 07/02/2017 1638   CBC    Component Value Date/Time   WBC 14.5 (H) 07/02/2017 1638   RBC 2.97 (L) 07/02/2017 1638     07/02/2017 1642   HGB 9.1 05/30/2017 1359   HCT 26.3 07/02/2017 1642   HCT 44.0 05/30/2017 1359   PLT 89 (L) 07/02/2017 1638   PLT 202 05/30/2017 1359   MCV 88.6 07/02/2017 1638   MCV 87 05/30/2017 1359   MCH 30.6 07/02/2017 1638   MCHC 34.6 07/02/2017 1638   RDW 14.8 07/02/2017 1638   RDW 15.1 05/30/2017 1359   LYMPHSABS 2.7 05/30/2017 1359   EOSABS 0.3 05/30/2017 1359   BASOSABS 0.0 05/30/2017 1359

## 2017-07-02 NOTE — Op Note (Signed)
NAMWindy Fast:  Kazlauskas, Deke                 ACCOUNT NO.:  [id number]  MEDICAL RECORD NO.:  [id number]  LOCATION:  2H26C                        FACILITY:  [facility]  PHYSICIAN:  Kerin PernaPeter Van Trigt, M.D.  DATE OF BIRTH:  1951-08-30  DATE OF PROCEDURE:  07/02/2017 DATE OF DISCHARGE:                              OPERATIVE REPORT   OPERATION:  Placement of right chest tube.  PREOPERATIVE DIAGNOSIS:  Postoperative right 30% pneumothorax.  POSTOPERATIVE DIAGNOSIS:  Postoperative right 30% pneumothorax.  SURGEON:  Kerin PernaPeter Van Trigt, M.D.  ANESTHESIA:  Local lidocaine 1% with IV conscious sedation.  OPERATIVE PROCEDURE:  The patient was on 100% non-rebreather oxygen after CABG yesterday.  Morning chest x-ray showed a new right basilar pneumothorax.  Right chest tube placement was recommended to the patient.  Informed consent was obtained.  The patient's right chest was prepped and draped as a sterile field.  A proper time-out was performed.  Local 1% lidocaine was infiltrated in the fifth interspace anterior axillary line.  A small incision was made. Further infiltration of 1% lidocaine down to the intercostal muscle was performed.  The pleural space was entered.  Exit of air was noted.  A 28- French chest tube on a trocar was inserted in the pleural space and directed to the apex and then secured to the skin.  It was connected to an underwater seal Pleur-evac drainage system.  There was good movement of the water column.  A sterile dressing was applied and chest x-ray was pending.     Kerin PernaPeter Van Trigt, M.D.     PV/MEDQ  D:  07/02/2017  T:  07/02/2017  Job:  [id number]

## 2017-07-03 ENCOUNTER — Inpatient Hospital Stay (HOSPITAL_COMMUNITY): Payer: Medicare Other

## 2017-07-03 LAB — POCT I-STAT, CHEM 8
BUN: 17 mg/dL (ref 6–20)
Calcium, Ion: 1.13 mmol/L — ABNORMAL LOW (ref 1.15–1.40)
Chloride: 101 mmol/L (ref 101–111)
Creatinine, Ser: 0.9 mg/dL (ref 0.61–1.24)
Glucose, Bld: 149 mg/dL — ABNORMAL HIGH (ref 65–99)
HCT: 19 % — ABNORMAL LOW (ref 39.0–52.0)
Hemoglobin: 6.5 g/dL — CL (ref 13.0–17.0)
Potassium: 3.6 mmol/L (ref 3.5–5.1)
Sodium: 138 mmol/L (ref 135–145)
TCO2: 24 mmol/L (ref 0–100)

## 2017-07-03 LAB — GLUCOSE, CAPILLARY
Glucose-Capillary: 110 mg/dL — ABNORMAL HIGH (ref 65–99)
Glucose-Capillary: 151 mg/dL — ABNORMAL HIGH (ref 65–99)
Glucose-Capillary: 160 mg/dL — ABNORMAL HIGH (ref 65–99)
Glucose-Capillary: 175 mg/dL — ABNORMAL HIGH (ref 65–99)
Glucose-Capillary: 193 mg/dL — ABNORMAL HIGH (ref 65–99)

## 2017-07-03 LAB — CBC
HCT: 22.4 % — ABNORMAL LOW (ref 39.0–52.0)
Hemoglobin: 7.7 g/dL — ABNORMAL LOW (ref 13.0–17.0)
MCH: 30.6 pg (ref 26.0–34.0)
MCHC: 34.4 g/dL (ref 30.0–36.0)
MCV: 88.9 fL (ref 78.0–100.0)
Platelets: 89 10*3/uL — ABNORMAL LOW (ref 150–400)
RBC: 2.52 MIL/uL — ABNORMAL LOW (ref 4.22–5.81)
RDW: 14.7 % (ref 11.5–15.5)
WBC: 14.4 10*3/uL — ABNORMAL HIGH (ref 4.0–10.5)

## 2017-07-03 LAB — BASIC METABOLIC PANEL
Anion gap: 5 (ref 5–15)
BUN: 16 mg/dL (ref 6–20)
CO2: 24 mmol/L (ref 22–32)
Calcium: 7.7 mg/dL — ABNORMAL LOW (ref 8.9–10.3)
Chloride: 106 mmol/L (ref 101–111)
Creatinine, Ser: 0.89 mg/dL (ref 0.61–1.24)
GFR calc Af Amer: >60 mL/min (ref >60)
GFR calc non Af Amer: >60 mL/min (ref >60)
Glucose, Bld: 173 mg/dL — ABNORMAL HIGH (ref 65–99)
Potassium: 3.9 mmol/L (ref 3.5–5.1)
Sodium: 135 mmol/L (ref 135–145)

## 2017-07-03 LAB — HEMOGLOBIN AND HEMATOCRIT, BLOOD
HCT: 20.1 % — ABNORMAL LOW (ref 39.0–52.0)
Hemoglobin: 7 g/dL — ABNORMAL LOW (ref 13.0–17.0)

## 2017-07-03 LAB — PREPARE RBC (CROSSMATCH)

## 2017-07-03 MED ORDER — SODIUM CHLORIDE 0.9 % IV SOLN
Freq: Once | INTRAVENOUS | Status: AC
  Administered 2017-07-03: 21:00:00 via INTRAVENOUS

## 2017-07-03 NOTE — Progress Notes (Signed)
Patient ID: Brendan PrimusLarry Mondor, male   DOB: 1951-06-24, 66 y.o.   MRN: [id number] EVENING ROUNDS NOTE :     [address]       [place] [id number]             [phone]                 2 Days Post-Op Procedure(s) (LRB): CORONARY ARTERY BYPASS GRAFTING (CABG) x4, using left mammary artery and right greater saphenous vein harvested endoscopically (N/A) TRANSESOPHAGEAL ECHOCARDIOGRAM (TEE) (N/A)  Total Length of Stay:  LOS: 2 days  BP (!) 114/102   Pulse 92   Temp 99.5 F (37.5 C) (Axillary)   Resp 17   Ht 6' 1.5" (1.867 m)   Wt 193 lb 5.5 oz (87.7 kg)   SpO2 92%   BMI 25.16 kg/m   .Intake/Output      07/04 0701 - 07/05 0700   P.O. 960   I.V. (mL/kg) 289.3 (3.3)   IV Piggyback    Total Intake(mL/kg) 1249.3 (14.2)   Urine (mL/kg/hr) 1300 (1.2)   Chest Tube 110 (0.1)   Total Output 1410   Net -160.7         . sodium chloride Stopped (07/03/17 0000)  . sodium chloride 250 mL (07/02/17 0700)  . sodium chloride Stopped (07/02/17 0000)  . amiodarone Stopped (07/02/17 1000)  . DOPamine 2.5 mcg/kg/min (07/03/17 0700)  . insulin (NOVOLIN-R) infusion Stopped (07/02/17 1200)  . lactated ringers    . lactated ringers 10 mL/hr at 07/03/17 0700  . lactated ringers Stopped (07/02/17 0000)  . milrinone 0.3 mcg/kg/min (07/03/17 1736)  . nitroGLYCERIN Stopped (07/01/17 1500)  . norepinephrine (LEVOPHED) Adult infusion Stopped (07/03/17 1012)  . phenylephrine (NEO-SYNEPHRINE) Adult infusion Stopped (07/01/17 1530)     Lab Results  Component Value Date   WBC 14.4 (H) 07/03/2017   HGB 7.0 (L) 07/03/2017   HCT 20.1 (L) 07/03/2017   PLT 89 (L) 07/03/2017   GLUCOSE 149 (H) 07/03/2017   CHOL 110 03/19/2017   TRIG 98 03/19/2017   HDL 36 (L) 03/19/2017   LDLCALC 54 03/19/2017   ALT 15 (L) 06/18/2017   AST 18 06/18/2017   NA 138 07/03/2017   K 3.6 07/03/2017   CL 101 07/03/2017   CREATININE 0.90 07/03/2017   BUN 17 07/03/2017   CO2 24 07/03/2017   TSH 1.460  06/05/2016   PSA from PP 05/17/2015   INR 1.10 06/18/2017   HGBA1C 6.1 (H) 06/18/2017   MICROALBUR 10 03/19/2017   hgb down to 7.0 this afternoon, with limited lung reserve will transfuse  Walked in unit  Delight OvensEdward B Artice Holohan MD  Beeper [phone] Office [phone] 07/03/2017 7:26 PM

## 2017-07-03 NOTE — Progress Notes (Signed)
Patient ID: Brendan PrimusLarry [name], male   DOB: Apr 18, 1951, 66 y.o.   MRN: [id number] TCTS DAILY ICU PROGRESS NOTE                   [address]            [place] [id number]          [phone]   2 Days Post-Op Procedure(s) (LRB): CORONARY ARTERY BYPASS GRAFTING (CABG) x4, using left mammary artery and right greater saphenous vein harvested endoscopically (N/A) TRANSESOPHAGEAL ECHOCARDIOGRAM (TEE) (N/A)  Total Length of Stay:  LOS: 2 days   Subjective: Up to chair , on venti mask  Objective: Vital signs in last 24 hours: Temp:  [98.1 F (36.7 C)-100 F (37.8 C)] 99 F (37.2 C) (07/04 0827) Pulse Rate:  [67-87] 85 (07/04 0900) Cardiac Rhythm: Normal sinus rhythm (07/04 0800) Resp:  [8-22] 12 (07/04 0900) BP: (90-118)/(52-73) 111/63 (07/04 0900) SpO2:  [86 %-100 %] 93 % (07/04 0900) Arterial Line BP: (114-134)/(39-47) 114/39 (07/03 2015) FiO2 (%):  [55 %-100 %] 55 % (07/04 0815) Weight:  [193 lb 5.5 oz (87.7 kg)] 193 lb 5.5 oz (87.7 kg) (07/04 0500)  Filed Weights   07/01/17 0552 07/02/17 0500 07/03/17 0500  Weight: 184 lb (83.5 kg) 194 lb 10.7 oz (88.3 kg) 193 lb 5.5 oz (87.7 kg)    Weight change: -1 lb 5.2 oz (-0.6 kg)   Hemodynamic parameters for last 24 hours:    Intake/Output from previous day: 07/03 0701 - 07/04 0700 In: 1714.6 [P.O.:240; I.V.:1174.6; IV Piggyback:300] Out: 2880 [Urine:2500; Chest Tube:380]  Intake/Output this shift: Total I/O In: 62 [I.V.:62] Out: 505 [Urine:415; Chest Tube:90]  Current Meds: Scheduled Meds: . acetaminophen  1,000 mg Oral Q6H   Or  . acetaminophen (TYLENOL) oral liquid 160 mg/5 mL  1,000 mg Per Tube Q6H  . amiodarone  200 mg Oral BID  . aspirin EC  325 mg Oral Daily   Or  . aspirin  324 mg Per Tube Daily  . atorvastatin  40 mg Oral Daily  . azelastine  2 spray Each Nare QHS  . bisacodyl  10 mg Oral Daily   Or  . bisacodyl  10 mg Rectal Daily  . Chlorhexidine Gluconate Cloth  6 each Topical Daily  .  docusate sodium  200 mg Oral Daily  . fentaNYL (SUBLIMAZE) injection  25 mcg Intravenous Once  . furosemide  20 mg Intravenous BID  . guaiFENesin  600 mg Oral BID  . insulin aspart  0-24 Units Subcutaneous Q4H  . insulin aspart  4 Units Subcutaneous TID WC  . insulin detemir  14 Units Subcutaneous BID  . levalbuterol  0.63 mg Nebulization TID  . mouth rinse  15 mL Mouth Rinse BID  . metoCLOPramide (REGLAN) injection  10 mg Intravenous Q6H  . metoprolol tartrate  12.5 mg Oral BID   Or  . metoprolol tartrate  12.5 mg Per Tube BID  . mometasone-formoterol  2 puff Inhalation BID  . montelukast  10 mg Oral QHS  . multivitamin with minerals  1 tablet Oral Daily  . nicotine  21 mg Transdermal Daily  . pantoprazole  40 mg Oral Daily  . sodium chloride flush  10-40 mL Intracatheter Q12H  . sodium chloride flush  3 mL Intravenous Q12H  . tamsulosin  0.4 mg Oral QPC supper   Continuous Infusions: . sodium chloride Stopped (07/03/17 0000)  . sodium chloride 250 mL (07/02/17 0700)  . sodium  chloride Stopped (07/02/17 0000)  . amiodarone Stopped (07/02/17 1000)  . DOPamine 2.5 mcg/kg/min (07/03/17 0700)  . insulin (NOVOLIN-R) infusion Stopped (07/02/17 1200)  . lactated ringers    . lactated ringers 10 mL/hr at 07/03/17 0700  . lactated ringers Stopped (07/02/17 0000)  . milrinone 0.3 mcg/kg/min (07/03/17 0700)  . nitroGLYCERIN Stopped (07/01/17 1500)  . norepinephrine (LEVOPHED) Adult infusion 2.507 mcg/min (07/03/17 0700)  . phenylephrine (NEO-SYNEPHRINE) Adult infusion Stopped (07/01/17 1530)   PRN Meds:.sodium chloride, lactated ringers, metoprolol tartrate, midazolam, morphine injection, ondansetron (ZOFRAN) IV, oxyCODONE, sodium chloride flush, sodium chloride flush, traMADol  General appearance: alert and cooperative Neurologic: intact Heart: regular rate and rhythm, S1, S2 normal, no murmur, click, rub or gallop Lungs: diminished breath sounds bibasilar Abdomen: soft, non-tender;  bowel sounds normal; no masses,  no organomegaly Extremities: extremities normal, atraumatic, no cyanosis or edema and Homans sign is negative, no sign of DVT Wound: sternum intact, no air leak from tubes   Lab Results: CBC: Recent Labs  07/02/17 1638 07/02/17 1642 07/03/17 0401  WBC 14.5*  --  14.4*  HGB 9.1* 7.5* 7.7*  HCT 26.3* 22.0* 22.4*  PLT 89*  --  89*   BMET:  Recent Labs  07/02/17 0357  07/02/17 1642 07/03/17 0401  NA 138  --  140 135  K 3.7  --  3.7 3.9  CL 110  --  106 106  CO2 22  --   --  24  GLUCOSE 95  --  149* 173*  BUN 8  --  9 16  CREATININE 0.67  < > 0.70 0.89  CALCIUM 7.3*  --   --  7.7*  < > = values in this interval not displayed.  CMET: Lab Results  Component Value Date   WBC 14.4 (H) 07/03/2017   HGB 7.7 (L) 07/03/2017   HCT 22.4 (L) 07/03/2017   PLT 89 (L) 07/03/2017   GLUCOSE 173 (H) 07/03/2017   CHOL 110 03/19/2017   TRIG 98 03/19/2017   HDL 36 (L) 03/19/2017   LDLCALC 54 03/19/2017   ALT 15 (L) 06/18/2017   AST 18 06/18/2017   NA 135 07/03/2017   K 3.9 07/03/2017   CL 106 07/03/2017   CREATININE 0.89 07/03/2017   BUN 16 07/03/2017   CO2 24 07/03/2017   TSH 1.460 06/05/2016   PSA from PP 05/17/2015   INR 1.10 06/18/2017   HGBA1C 6.1 (H) 06/18/2017   MICROALBUR 10 03/19/2017      PT/INR: No results for input(s): LABPROT, INR in the last 72 hours. Radiology: Dg Chest Port 1 View  Result Date: 07/03/2017 CLINICAL DATA:  Shortness of breath, congestion EXAM: PORTABLE CHEST 1 VIEW COMPARISON:  07/02/2017 FINDINGS: Bilateral chest tubes. Interval removal of Swan-Ganz catheter. No pneumothorax. Prior CABG. Bibasilar atelectasis, left greater than right. Small left pleural effusion. Mild vascular congestion and interstitial prominence. IMPRESSION: No pneumothorax. Bibasilar atelectasis and small left effusion. Mild vascular congestion and interstitial prominence. Electronically Signed   By: Charlett Nose M.D.   On: 07/03/2017 08:47      Assessment/Plan: S/P Procedure(s) (LRB): CORONARY ARTERY BYPASS GRAFTING (CABG) x4, using left mammary artery and right greater saphenous vein harvested endoscopically (N/A) TRANSESOPHAGEAL ECHOCARDIOGRAM (TEE) (N/A) Mobilize Diuresis Leave right chest tubes in, place to water seal, d/c left and mt  Wean levophed as tolerated , also on dopamine and milrinone     Delight Ovens 07/03/2017 10:03 AM

## 2017-07-04 ENCOUNTER — Inpatient Hospital Stay (HOSPITAL_COMMUNITY): Payer: Medicare Other

## 2017-07-04 LAB — BASIC METABOLIC PANEL
Anion gap: 4 — ABNORMAL LOW (ref 5–15)
BUN: 15 mg/dL (ref 6–20)
CO2: 26 mmol/L (ref 22–32)
Calcium: 7.6 mg/dL — ABNORMAL LOW (ref 8.9–10.3)
Chloride: 108 mmol/L (ref 101–111)
Creatinine, Ser: 0.9 mg/dL (ref 0.61–1.24)
GFR calc Af Amer: >60 mL/min (ref >60)
GFR calc non Af Amer: >60 mL/min (ref >60)
Glucose, Bld: 97 mg/dL (ref 65–99)
Potassium: 3.5 mmol/L (ref 3.5–5.1)
Sodium: 138 mmol/L (ref 135–145)

## 2017-07-04 LAB — POCT I-STAT, CHEM 8
BUN: 17 mg/dL (ref 6–20)
Calcium, Ion: 1.08 mmol/L — ABNORMAL LOW (ref 1.15–1.40)
Chloride: 106 mmol/L (ref 101–111)
Creatinine, Ser: 1 mg/dL (ref 0.61–1.24)
Glucose, Bld: 108 mg/dL — ABNORMAL HIGH (ref 65–99)
HCT: 24 % — ABNORMAL LOW (ref 39.0–52.0)
Hemoglobin: 8.2 g/dL — ABNORMAL LOW (ref 13.0–17.0)
Potassium: 4 mmol/L (ref 3.5–5.1)
Sodium: 142 mmol/L (ref 135–145)
TCO2: 23 mmol/L (ref 0–100)

## 2017-07-04 LAB — GLUCOSE, CAPILLARY
Glucose-Capillary: 106 mg/dL — ABNORMAL HIGH (ref 65–99)
Glucose-Capillary: 122 mg/dL — ABNORMAL HIGH (ref 65–99)
Glucose-Capillary: 123 mg/dL — ABNORMAL HIGH (ref 65–99)
Glucose-Capillary: 78 mg/dL (ref 65–99)
Glucose-Capillary: 97 mg/dL (ref 65–99)
Glucose-Capillary: 99 mg/dL (ref 65–99)

## 2017-07-04 LAB — CBC
HCT: 21.7 % — ABNORMAL LOW (ref 39.0–52.0)
Hemoglobin: 7.4 g/dL — ABNORMAL LOW (ref 13.0–17.0)
MCH: 30 pg (ref 26.0–34.0)
MCHC: 34.1 g/dL (ref 30.0–36.0)
MCV: 87.9 fL (ref 78.0–100.0)
Platelets: 92 10*3/uL — ABNORMAL LOW (ref 150–400)
RBC: 2.47 MIL/uL — ABNORMAL LOW (ref 4.22–5.81)
RDW: 14.7 % (ref 11.5–15.5)
WBC: 15.2 10*3/uL — ABNORMAL HIGH (ref 4.0–10.5)

## 2017-07-04 LAB — HEMOGLOBIN AND HEMATOCRIT, BLOOD
HCT: 23.2 % — ABNORMAL LOW (ref 39.0–52.0)
Hemoglobin: 8 g/dL — ABNORMAL LOW (ref 13.0–17.0)

## 2017-07-04 LAB — PREPARE RBC (CROSSMATCH)

## 2017-07-04 MED ORDER — POTASSIUM CHLORIDE CRYS ER 20 MEQ PO TBCR: 20.0000 meq | EXTENDED_RELEASE_TABLET | ORAL | Status: DC | PRN

## 2017-07-04 MED ORDER — POTASSIUM CHLORIDE 10 MEQ/50ML IV SOLN
10.0000 meq | INTRAVENOUS | Status: AC
  Administered 2017-07-04 (×2): 10 meq via INTRAVENOUS
  Filled 2017-07-04 (×2): qty 50

## 2017-07-04 MED ORDER — ENOXAPARIN SODIUM 30 MG/0.3ML ~~LOC~~ SOLN
30.0000 mg | SUBCUTANEOUS | Status: DC
  Administered 2017-07-04 – 2017-07-08 (×5): 30 mg via SUBCUTANEOUS
  Filled 2017-07-04 (×5): qty 0.3

## 2017-07-04 MED ORDER — FE FUMARATE-B12-VIT C-FA-IFC PO CAPS
1.0000 | ORAL_CAPSULE | Freq: Three times a day (TID) | ORAL | Status: DC
  Administered 2017-07-04 – 2017-07-09 (×16): 1 via ORAL
  Filled 2017-07-04 (×14): qty 1

## 2017-07-04 MED ORDER — INSULIN DETEMIR 100 UNIT/ML ~~LOC~~ SOLN
10.0000 [IU] | Freq: Two times a day (BID) | SUBCUTANEOUS | Status: DC
  Administered 2017-07-04 – 2017-07-05 (×3): 10 [IU] via SUBCUTANEOUS
  Filled 2017-07-04 (×5): qty 0.1

## 2017-07-04 MED ORDER — POTASSIUM CHLORIDE CRYS ER 20 MEQ PO TBCR
20.0000 meq | EXTENDED_RELEASE_TABLET | ORAL | Status: AC
  Administered 2017-07-04 (×3): 20 meq via ORAL
  Filled 2017-07-04 (×3): qty 1

## 2017-07-04 MED ORDER — MILRINONE LACTATE IN DEXTROSE 20-5 MG/100ML-% IV SOLN: 0.1250 ug/kg/min | INTRAVENOUS | Status: DC

## 2017-07-04 NOTE — Plan of Care (Addendum)
Problem: Activity: Goal: Risk for activity intolerance will decrease Pt progressing well and increasing mobility daily. Ambulating in hallways ([id number]) with minimal assist. Follows sternal precautions.   Problem: Nutritional: Goal: Risk for body nutrition deficit will decrease Pt consuming 75-100% of meals, no issues with nausea or decreased appetite.

## 2017-07-04 NOTE — Progress Notes (Signed)
CT surgery p.m. Rounds  Resting quietly in bed on nasal cannula O2 sat 93-94 percent Chest tube to suction with small air leak Maintaining sinus rhythm Continue current care

## 2017-07-04 NOTE — Progress Notes (Signed)
3 Days Post-Op Procedure(s) (LRB): CORONARY ARTERY BYPASS GRAFTING (CABG) x4, using left mammary artery and right greater saphenous vein harvested endoscopically (N/A) TRANSESOPHAGEAL ECHOCARDIOGRAM (TEE) (N/A) Subjective: Postop CABG for severe CAD Preoperative severe COPD emphysema Large right spontaneous pneumothorax postop day 1 treated with chest tube, the chest tube is in place and water seal with a positive airleak The patient had coagulopathy and surgical blood loss-hemoglobin 7.5 g today. We'll transfuse one unit of packed cells since patient has difficulty with oxygenation and underlying lung disease Maintaining sinus rhythm  Objective: Vital signs in last 24 hours: Temp:  [98.3 F (36.8 C)-99.8 F (37.7 C)] 98.5 F (36.9 C) (07/05 1600) Pulse Rate:  [76-96] 85 (07/05 1600) Cardiac Rhythm: Normal sinus rhythm (07/05 0800) Resp:  [9-21] 16 (07/05 1800) BP: (87-133)/(50-102) 114/74 (07/05 1800) SpO2:  [89 %-100 %] 92 % (07/05 1800) FiO2 (%):  [45 %-55 %] 45 % (07/05 0800) Weight:  [190 lb 14.7 oz (86.6 kg)] 190 lb 14.7 oz (86.6 kg) (07/05 0700)  Hemodynamic parameters for last 24 hours:    Intake/Output from previous day: 07/04 0701 - 07/05 0700 In: 1867.5 [P.O.:960; I.V.:570.5; Blood:337] Out: 2535 [Urine:2425; Chest Tube:110] Intake/Output this shift: Total I/O In: 1132.1 [P.O.:600; I.V.:147.1; Blood:335; IV Piggyback:50] Out: 1325 [Urine:1325]       Exam    General- alert and comfortable   Lungs- clear without rales, wheezes positive airleak   Cor- regular rate and rhythm, no murmur , gallop   Abdomen- soft, non-tender   Extremities - warm, non-tender, minimal edema   Neuro- oriented, appropriate, no focal weakness   Lab Results:  Recent Labs  07/03/17 0401  07/03/17 2351 07/04/17 0413  WBC 14.4*  --   --  15.2*  HGB 7.7*  < > 8.0* 7.4*  HCT 22.4*  < > 23.2* 21.7*  PLT 89*  --   --  92*  < > = values in this interval not displayed. BMET:  Recent  Labs  07/03/17 0401 07/03/17 1601 07/04/17 0413  NA 135 138 138  K 3.9 3.6 3.5  CL 106 101 108  CO2 24  --  26  GLUCOSE 173* 149* 97  BUN 16 17 15   CREATININE 0.89 0.90 0.90  CALCIUM 7.7*  --  7.6*    PT/INR: No results for input(s): LABPROT, INR in the last 72 hours. ABG    Component Value Date/Time   PHART 7.476 (H) 07/02/2017 1735   HCO3 22.0 07/02/2017 1735   TCO2 24 07/03/2017 1601   ACIDBASEDEF 1.0 07/02/2017 1735   O2SAT 91.0 07/02/2017 1735   CBG (last 3)   Recent Labs  07/04/17 0753 07/04/17 1357 07/04/17 1610  GLUCAP 97 106* 123*    Assessment/Plan: S/P Procedure(s) (LRB): CORONARY ARTERY BYPASS GRAFTING (CABG) x4, using left mammary artery and right greater saphenous vein harvested endoscopically (N/A) TRANSESOPHAGEAL ECHOCARDIOGRAM (TEE) (N/A) Wean oxygen as able Continue chest tube because of persistent air leak and postop spontaneous pneumothorax Transfuse one unit of packed cells for expected blood loss anemia Mobilize with ambulation twice a day   LOS: 3 days    Kathlee Nationseter Van Trigt III 07/04/2017

## 2017-07-05 ENCOUNTER — Inpatient Hospital Stay (HOSPITAL_COMMUNITY): Payer: Medicare Other

## 2017-07-05 DIAGNOSIS — Z951 Presence of aortocoronary bypass graft: Secondary | ICD-10-CM

## 2017-07-05 LAB — COMPREHENSIVE METABOLIC PANEL
ALT: 26 U/L (ref 17–63)
AST: 25 U/L (ref 15–41)
Albumin: 2.6 g/dL — ABNORMAL LOW (ref 3.5–5.0)
Alkaline Phosphatase: 34 U/L — ABNORMAL LOW (ref 38–126)
Anion gap: 6 (ref 5–15)
BUN: 14 mg/dL (ref 6–20)
CO2: 24 mmol/L (ref 22–32)
Calcium: 7.8 mg/dL — ABNORMAL LOW (ref 8.9–10.3)
Chloride: 111 mmol/L (ref 101–111)
Creatinine, Ser: 0.95 mg/dL (ref 0.61–1.24)
GFR calc Af Amer: >60 mL/min (ref >60)
GFR calc non Af Amer: >60 mL/min (ref >60)
Glucose, Bld: 85 mg/dL (ref 65–99)
Potassium: 3.7 mmol/L (ref 3.5–5.1)
Sodium: 141 mmol/L (ref 135–145)
Total Bilirubin: 0.8 mg/dL (ref 0.3–1.2)
Total Protein: 4.8 g/dL — ABNORMAL LOW (ref 6.5–8.1)

## 2017-07-05 LAB — GLUCOSE, CAPILLARY
Glucose-Capillary: 82 mg/dL (ref 65–99)
Glucose-Capillary: 85 mg/dL (ref 65–99)
Glucose-Capillary: 96 mg/dL (ref 65–99)
Glucose-Capillary: 145 mg/dL — ABNORMAL HIGH (ref 65–99)
Glucose-Capillary: 86 mg/dL (ref 65–99)

## 2017-07-05 LAB — TYPE AND SCREEN
ABO/RH(D): O POS
Antibody Screen: NEGATIVE
Unit division: 0
Unit division: 0
Unit division: 0
Unit division: 0

## 2017-07-05 LAB — CBC
HCT: 24.8 % — ABNORMAL LOW (ref 39.0–52.0)
Hemoglobin: 8.2 g/dL — ABNORMAL LOW (ref 13.0–17.0)
MCH: 29.2 pg (ref 26.0–34.0)
MCHC: 33.1 g/dL (ref 30.0–36.0)
MCV: 88.3 fL (ref 78.0–100.0)
Platelets: 137 10*3/uL — ABNORMAL LOW (ref 150–400)
RBC: 2.81 MIL/uL — ABNORMAL LOW (ref 4.22–5.81)
RDW: 16.3 % — ABNORMAL HIGH (ref 11.5–15.5)
WBC: 14.1 10*3/uL — ABNORMAL HIGH (ref 4.0–10.5)

## 2017-07-05 LAB — BPAM RBC
Blood Product Expiration Date: 201807202359
Blood Product Expiration Date: 201807202359
Blood Product Expiration Date: 201807242359
Blood Product Expiration Date: 201807242359
ISSUE DATE / TIME: 201806290759
ISSUE DATE / TIME: 201806290759
ISSUE DATE / TIME: 201807042009
ISSUE DATE / TIME: 201807051102
Unit Type and Rh: 5100
Unit Type and Rh: 5100
Unit Type and Rh: 5100
Unit Type and Rh: 5100

## 2017-07-05 MED ORDER — SODIUM CHLORIDE 0.9% FLUSH
3.0000 mL | Freq: Two times a day (BID) | INTRAVENOUS | Status: DC
  Administered 2017-07-05 – 2017-07-08 (×6): 3 mL via INTRAVENOUS

## 2017-07-05 MED ORDER — SODIUM CHLORIDE 0.9 % IV SOLN: 250.0000 mL | INTRAVENOUS | Status: DC | PRN

## 2017-07-05 MED ORDER — SODIUM CHLORIDE 0.9% FLUSH
3.0000 mL | INTRAVENOUS | Status: DC | PRN
  Administered 2017-07-07: 3 mL via INTRAVENOUS
  Filled 2017-07-05: qty 3

## 2017-07-05 MED ORDER — POTASSIUM CHLORIDE 10 MEQ/50ML IV SOLN
10.0000 meq | INTRAVENOUS | Status: AC
  Administered 2017-07-05 (×3): 10 meq via INTRAVENOUS
  Filled 2017-07-05 (×3): qty 50

## 2017-07-05 MED ORDER — INSULIN ASPART 100 UNIT/ML ~~LOC~~ SOLN
0.0000 [IU] | Freq: Three times a day (TID) | SUBCUTANEOUS | Status: DC
  Administered 2017-07-05: 2 [IU] via SUBCUTANEOUS
  Administered 2017-07-06 (×2): 4 [IU] via SUBCUTANEOUS

## 2017-07-05 MED ORDER — FUROSEMIDE 10 MG/ML IJ SOLN
20.0000 mg | Freq: Every day | INTRAMUSCULAR | Status: DC
  Administered 2017-07-05: 20 mg via INTRAVENOUS
  Filled 2017-07-05: qty 2

## 2017-07-05 MED ORDER — MAGNESIUM HYDROXIDE 400 MG/5ML PO SUSP: 30.0000 mL | Freq: Every day | ORAL | Status: DC | PRN

## 2017-07-05 MED ORDER — POTASSIUM CHLORIDE CRYS ER 20 MEQ PO TBCR
20.0000 meq | EXTENDED_RELEASE_TABLET | Freq: Every day | ORAL | Status: DC
  Administered 2017-07-05 – 2017-07-09 (×5): 20 meq via ORAL
  Filled 2017-07-05 (×5): qty 1

## 2017-07-05 MED ORDER — MOVING RIGHT ALONG BOOK
Freq: Once | Status: AC
  Administered 2017-07-05: 10:00:00
  Filled 2017-07-05: qty 1

## 2017-07-05 MED FILL — Sodium Chloride IV Soln 0.9%: INTRAVENOUS | Qty: 3000 | Status: AC

## 2017-07-05 MED FILL — Lidocaine HCl IV Inj 20 MG/ML: INTRAVENOUS | Qty: 5 | Status: AC

## 2017-07-05 MED FILL — Electrolyte-R (PH 7.4) Solution: INTRAVENOUS | Qty: 4000 | Status: AC

## 2017-07-05 MED FILL — Mannitol IV Soln 20%: INTRAVENOUS | Qty: 500 | Status: AC

## 2017-07-05 MED FILL — Sodium Bicarbonate IV Soln 8.4%: INTRAVENOUS | Qty: 50 | Status: AC

## 2017-07-05 MED FILL — Heparin Sodium (Porcine) Inj 1000 Unit/ML: INTRAMUSCULAR | Qty: 10 | Status: AC

## 2017-07-05 NOTE — Progress Notes (Signed)
CARDIAC REHAB PHASE I   PRE:  Rate/Rhythm: 75 SR    BP: sitting 118/71    SaO2: 91 2L  MODE:  Ambulation: 350 ft   POST:  Rate/Rhythm: 95 SR    BP: sitting 133/78     SaO2: 88 3L  Pt feeling pretty well. Able to stand with min assist and walk with RW. Used gait belt and 3L O2 (tried 2L initially but would not register therefore increased to 3L). No major c/o walking, just fatigue with distance. Slow pace. Return to recliner, 88 3L. Encouraged more walking and IS. He will need SNF at d/c. [phone]   Harriet Massonandi Kristan Rodrigus Kilker CES, ACSM 07/05/2017 2:21 PM

## 2017-07-05 NOTE — Progress Notes (Signed)
4 Days Post-Op Procedure(s) (LRB): CORONARY ARTERY BYPASS GRAFTING (CABG) x4, using left mammary artery and right greater saphenous vein harvested endoscopically (N/A) TRANSESOPHAGEAL ECHOCARDIOGRAM (TEE) (N/A) Subjective: O2 sats now stable on nasal cannula Hb improved 8.2 post transfusion nsr R chest tube for postop pntx with small airleak water seal tx to stepdown  Objective: Vital signs in last 24 hours: Temp:  [98.3 F (36.8 C)-99.9 F (37.7 C)] 98.7 F (37.1 C) (07/06 0300) Pulse Rate:  [72-87] 73 (07/06 0700) Cardiac Rhythm: Normal sinus rhythm (07/06 0400) Resp:  [12-21] 21 (07/06 0700) BP: (93-133)/(55-80) 106/67 (07/06 0700) SpO2:  [89 %-100 %] 93 % (07/06 0700) Weight:  [189 lb 9.5 oz (86 kg)] 189 lb 9.5 oz (86 kg) (07/06 0500)  Hemodynamic parameters for last 24 hours:  stable  Intake/Output from previous day: 07/05 0701 - 07/06 0700 In: 1222.4 [P.O.:600; I.V.:187.4; Blood:335; IV Piggyback:100] Out: 2135 [Urine:2095; Chest Tube:40] Intake/Output this shift: No intake/output data recorded.       Exam    General- alert and comfortable   Lungs- clear without rales, wheezes   Cor- regular rate and rhythm, no murmur , gallop   Abdomen- soft, non-tender   Extremities - warm, non-tender, minimal edema   Neuro- oriented, appropriate, no focal weakness   Lab Results:  Recent Labs  07/04/17 0413 07/04/17 1720 07/05/17 0430  WBC 15.2*  --  14.1*  HGB 7.4* 8.2* 8.2*  HCT 21.7* 24.0* 24.8*  PLT 92*  --  137*   BMET:  Recent Labs  07/04/17 0413 07/04/17 1720 07/05/17 0430  NA 138 142 141  K 3.5 4.0 3.7  CL 108 106 111  CO2 26  --  24  GLUCOSE 97 108* 85  BUN 15 17 14   CREATININE 0.90 1.00 0.95  CALCIUM 7.6*  --  7.8*    PT/INR: No results for input(s): LABPROT, INR in the last 72 hours. ABG    Component Value Date/Time   PHART 7.476 (H) 07/02/2017 1735   HCO3 22.0 07/02/2017 1735   TCO2 23 07/04/2017 1720   ACIDBASEDEF 1.0 07/02/2017 1735    O2SAT 91.0 07/02/2017 1735   CBG (last 3)   Recent Labs  07/04/17 1939 07/04/17 2350 07/05/17 0402  GLUCAP 122* 78 82    Assessment/Plan: S/P Procedure(s) (LRB): CORONARY ARTERY BYPASS GRAFTING (CABG) x4, using left mammary artery and right greater saphenous vein harvested endoscopically (N/A) TRANSESOPHAGEAL ECHOCARDIOGRAM (TEE) (N/A) Mobilize Diuresis Diabetes control Plan for transfer to step-down: see transfer orders   LOS: 4 days    Kathlee Nationseter Van Trigt III 07/05/2017

## 2017-07-05 NOTE — Discharge Summary (Signed)
Physician Discharge Summary  Patient ID: Brendan [name] MRN: [id number] DOB/AGE: 1951/12/10 66 y.o.  Admit date: 07/01/2017 Discharge date: 07/09/2017  Admission Diagnoses: Coronary artery disease  Active Diagnoses:   Patient Active Problem List   Diagnosis Date Noted  . Stable angina (HCC) 04/02/2017  . Advanced care planning/counseling discussion 03/19/2017  . COPD (chronic obstructive pulmonary disease) (HCC) 02/18/2017  . CAD (coronary artery disease) 06/05/2016  . Personal history of tobacco use, presenting hazards to health 05/03/2016  . Allergic rhinitis 03/26/2016  . Nocturia 02/29/2016  . Peripheral vascular disease (HCC) 11/23/2015  . ED (erectile dysfunction) 11/23/2015  . Hypertension 11/23/2015  . Diabetes mellitus type 2, controlled, without complications (HCC) 11/23/2015  . Hyperlipidemia 11/23/2015  . Tobacco use disorder 11/23/2015  . Constipation 11/23/2015   Discharged Condition: Stable  History of Present Illness:  Brendan [name] is a 66 yo AA male with diabetes and history of nicotine abuse.  He presented with complaints consistent with exertional angina.  He had underwent stress testing earlier in the year which was negative.  His symptoms continued to progress despite anti-anginal therapy and it was felt cardiac catheterization would be indicated.  This was done and showed multivessel CAD with a preserved EF.  It was felt coronary bypass would be indicated and TCTS consult was placed.  He was evaluated by Dr. Donata Clay who was in agreement coronary bypass surgery would be indicated.  The risks and benefits of the procedure were explained to the patient and he was agreeable to proceed.    Hospital Course:   Brendan [name] presented to [facility] 07[facility].  He underwent CABG x 4 utilizing to LIMA to LAD, SVG to Diagonal , SVG to OM, and SVG to PDA.  He also underwent endoscopic harvest of greater saphenous vein from right leg.  He tolerated the procedure without  difficulty and was taken to the SICU in stable condition.  He was extubated the evening of surgery.  During his stay in the SICU the patient was weaned off his Amiodarone drip for intraoperative atrial fibrillation.  Post operative CXR showed a right sided pneumothorax with subsequent placement of chest tube.  He was weaned off Milrinone, Dopamine, and Levophed as tolerated.  His left pleural chest tube and mediastinal chest tubes were removed POD #2.  The patient developed post operative blood loss anemia and was transfused a unit of packed cells.  His right sided chest tube was placed on water seal on POD #4. Left chest tube was removed on 07/08/2017. Follow up chest xray showed no pneumothorax, stable subcutaneous emphysema, small left pleural effusion and atelectasis. He is still requiring one liter of oxygen via [place]. He does have desaturation with exertion and will need at discharge to SNF.  We will ask the SNF to please removed the right chest tube suture on 07/21/2017. He is deconditioned and will need SNF at discharge.       We ask the SNF to please do the following:  1. Please obtain vital signs at least one time daily 2.Please weigh the patient daily. If he or she continues to gain weight or develops lower extremity edema, contact the office at [phone]. 3. Ambulate patient at least three times daily and please use sternal precautions.  Significant Diagnostic Studies: angiography:   Left Main  Vessel is large.  LM lesion, 20% stenosed. The lesion is eccentric. The lesion is calcified.  Left Anterior Descending  The vessel is mildly tortuous. The proximal  and mid LAD are heavily calcified.  Ost LAD lesion, 30% stenosed.  Prox LAD lesion, 50% stenosed. The lesion is eccentric. The lesion is calcified.  Mid LAD lesion, 90% stenosed. The lesion is located at the bifurcation. The lesion is calcified.  First Diagonal Branch  Vessel is moderate in size.  1st Diag lesion, 50% stenosed.   Second Diagonal Branch  Vessel is moderate in size.  Ost 2nd Diag lesion, 80% stenosed.  Third Diagonal Branch  Vessel is small in size.  Left Circumflex  Vessel is moderate in size. The vessel is moderately tortuous. The vessel is ectatic.  First Obtuse Marginal Branch  Vessel is small in size.  Second Obtuse Marginal Branch  Vessel is moderate in size.  Ost 2nd Mrg lesion, 90% stenosed.  2nd Mrg lesion, 70% stenosed.  Right Coronary Artery  Vessel is large.  Prox RCA to Mid RCA lesion, 80% stenosed. The lesion is mildly calcified.  Mid RCA lesion, 90% stenosed. The lesion is concentric.  Right Posterior Descending Artery  Vessel is moderate in size. There is mild disease in the vessel.  Right Posterior Atrioventricular Branch  Vessel is moderate in size.  First Right Posterolateral  Vessel is moderate in size.  1st RPLB lesion, 50% stenosed.  Second Right Posterolateral  Vessel is small in size.  Third Right Posterolateral  Vessel is small in size.   Treatments: surgery:   1. Coronary artery bypass grafting x4 (left internal mammary artery to     left anterior descending, saphenous vein graft to second diagonal,     saphenous vein graft to obtuse marginal, saphenous vein graft to     posterior descending). 2. Endoscopic harvest of right leg greater saphenous vein.  Disposition: To SNF.  Latest Imaging Study: CLINICAL DATA:  Chest tube removal  EXAM: CHEST  2 VIEW  COMPARISON:  07/07/2017  FINDINGS: Right chest tube remains in place and unchanged.  No pneumothorax.  CABG changes. Negative for heart failure. Mild bibasilar atelectasis and small bilateral pleural effusions with mild improvement since the prior study.  IMPRESSION: Negative for pneumothorax.  Right chest tube remains in place  Improvement in bibasilar atelectasis.   Electronically Signed   By: Marlan Palau M.D.   On: 07/08/2017 07:33  Discharge Instructions    Amb Referral to  Cardiac Rehabilitation    Complete by:  As directed    Diagnosis:  CABG   CABG X ___:  4     Allergies as of 07/09/2017      Reactions   No Known Allergies       Medication List    STOP taking these medications   amLODipine 10 MG tablet Commonly known as:  NORVASC   isosorbide mononitrate 30 MG 24 hr tablet Commonly known as:  IMDUR   lisinopril 40 MG tablet Commonly known as:  PRINIVIL,ZESTRIL   nitroGLYCERIN 0.4 MG SL tablet Commonly known as:  NITROSTAT   ranolazine 500 MG 12 hr tablet Commonly known as:  RANEXA     TAKE these medications   acetaminophen 500 MG tablet Commonly known as:  TYLENOL Take 1 tablet (500 mg total) by mouth every 6 (six) hours as needed for mild pain or headache. What changed:  how much to take  when to take this  reasons to take this   albuterol 108 (90 Base) MCG/ACT inhaler Commonly known as:  PROVENTIL HFA;VENTOLIN HFA Inhale 2 puffs into the lungs every 6 (six) hours as needed for wheezing or  shortness of breath.   amiodarone 200 MG tablet Commonly known as:  PACERONE Take 1 tablet (200 mg total) by mouth daily.   aspirin 325 MG EC tablet Take 1 tablet (325 mg total) by mouth daily. What changed:  medication strength  how much to take   atorvastatin 40 MG tablet Commonly known as:  LIPITOR Take 1 tablet (40 mg total) by mouth daily. What changed:  how much to take  when to take this   azelastine 0.1 % nasal spray Commonly known as:  ASTELIN Place 2 sprays into both nostrils at bedtime.   ferrous sulfate 325 (65 FE) MG tablet Take 1 tablet (325 mg total) by mouth daily. For one month then stop.   furosemide 40 MG tablet Commonly known as:  LASIX Take 1 tablet (40 mg total) by mouth daily. For 5 days then stop. Start taking on:  07/10/2017   guaiFENesin 600 MG 12 hr tablet Commonly known as:  MUCINEX Take 1 tablet (600 mg total) by mouth 2 (two) times daily as needed for cough or to loosen phlegm.    metFORMIN 500 MG tablet Commonly known as:  GLUCOPHAGE Take 1 tablet (500 mg total) by mouth 2 (two) times daily with a meal.   metoprolol tartrate 25 MG tablet Commonly known as:  LOPRESSOR Take 0.5 tablets (12.5 mg total) by mouth 2 (two) times daily.   montelukast 10 MG tablet Commonly known as:  SINGULAIR Take 10 mg by mouth at bedtime.   multivitamin with minerals Tabs tablet Take 1 tablet by mouth daily. ONE-A-DAY Start taking on:  08/09/2017 What changed:  These instructions start on 08/09/2017. If you are unsure what to do until then, ask your doctor or other care provider.   nicotine 21 mg/24hr patch Commonly known as:  NICODERM CQ - dosed in mg/24 hours Place 1 patch (21 mg total) onto the skin daily.   oxyCODONE 5 MG immediate release tablet Commonly known as:  Oxy IR/ROXICODONE Take 5 mg by mouth every 4-6 hours PRN severe pain.   potassium chloride SA 20 MEQ tablet Commonly known as:  K-DUR,KLOR-CON Take 1 tablet (20 mEq total) by mouth daily. For 5 days then stop. Start taking on:  07/10/2017   tamsulosin 0.4 MG Caps capsule Commonly known as:  FLOMAX Take 1 capsule (0.4 mg total) by mouth daily. What changed:  when to take this     The patient has been discharged on:   1.Beta Blocker:  Yes [  x ]                              No   [   ]                              If No, reason:  2.Ace Inhibitor/ARB: Yes [   ]                                     No  [    ]                                     If No, reason:  3.Statin:   Yes [ x  ]  No  [   ]                  If No, reason:  4.Ecasa:  Yes  [ x  ]                  No   [   ]                  If No, reason:  Contact information for follow-up providers    Kerin PernaVan Trigt, Peter, MD Follow up on 08/07/2017.   Specialty:  Cardiothoracic Surgery Why:  Appointment is at 12:00, please get CXR at 11:30 at [facility] located on first floor of our office building  Contact information: [address] [place] [address] [phone]        Yvonne KendallEnd, Christopher, MD Follow up on 07/31/2017.   Specialty:  Cardiology Why:  Appointment tie is at 10:40 am Contact information: [address] [place] [address] [phone]        SNF Follow up on 07/21/2017.   Why:  Please removed right chest tube suture on 07/21/2017.           Contact information for after-discharge care    Destination    [facility] Follow up.   Specialty:  Skilled Nursing Facility Contact information: [address] [place] [id number] [phone]                  Signed: Ardelle BallsZIMMERMAN,DONIELLE M PA-C 07/09/2017, 10:15 AM

## 2017-07-06 ENCOUNTER — Inpatient Hospital Stay (HOSPITAL_COMMUNITY): Payer: Medicare Other

## 2017-07-06 LAB — CBC
HCT: 24.2 % — ABNORMAL LOW (ref 39.0–52.0)
Hemoglobin: 8.1 g/dL — ABNORMAL LOW (ref 13.0–17.0)
MCH: 29.7 pg (ref 26.0–34.0)
MCHC: 33.5 g/dL (ref 30.0–36.0)
MCV: 88.6 fL (ref 78.0–100.0)
Platelets: 180 10*3/uL (ref 150–400)
RBC: 2.73 MIL/uL — ABNORMAL LOW (ref 4.22–5.81)
RDW: 16.1 % — ABNORMAL HIGH (ref 11.5–15.5)
WBC: 13.3 10*3/uL — ABNORMAL HIGH (ref 4.0–10.5)

## 2017-07-06 LAB — GLUCOSE, CAPILLARY
Glucose-Capillary: 120 mg/dL — ABNORMAL HIGH (ref 65–99)
Glucose-Capillary: 121 mg/dL — ABNORMAL HIGH (ref 65–99)
Glucose-Capillary: 178 mg/dL — ABNORMAL HIGH (ref 65–99)
Glucose-Capillary: 173 mg/dL — ABNORMAL HIGH (ref 65–99)

## 2017-07-06 LAB — BASIC METABOLIC PANEL
Anion gap: 7 (ref 5–15)
BUN: 15 mg/dL (ref 6–20)
CO2: 24 mmol/L (ref 22–32)
Calcium: 8.1 mg/dL — ABNORMAL LOW (ref 8.9–10.3)
Chloride: 110 mmol/L (ref 101–111)
Creatinine, Ser: 0.83 mg/dL (ref 0.61–1.24)
GFR calc Af Amer: >60 mL/min (ref >60)
GFR calc non Af Amer: >60 mL/min (ref >60)
Glucose, Bld: 124 mg/dL — ABNORMAL HIGH (ref 65–99)
Potassium: 3.7 mmol/L (ref 3.5–5.1)
Sodium: 141 mmol/L (ref 135–145)

## 2017-07-06 MED ORDER — METFORMIN HCL 500 MG PO TABS
500.0000 mg | ORAL_TABLET | Freq: Two times a day (BID) | ORAL | Status: DC
  Administered 2017-07-06 – 2017-07-09 (×6): 500 mg via ORAL
  Filled 2017-07-06 (×5): qty 1

## 2017-07-06 NOTE — Progress Notes (Signed)
Patient's BS was 86 last night. Held the Lantus and Novolog. Could only get patient to eat some ice cream. Patient also ambulated 14650ft earlier this morning

## 2017-07-06 NOTE — Progress Notes (Addendum)
      [address]       [place] [id number]             [phone]      5 Days Post-Op Procedure(s) (LRB): CORONARY ARTERY BYPASS GRAFTING (CABG) x4, using left mammary artery and right greater saphenous vein harvested endoscopically (N/A) TRANSESOPHAGEAL ECHOCARDIOGRAM (TEE) (N/A)   Subjective:  Patient states he is tired out this morning.  He states he was up ambulating the unit at 4AM because he couldn't sleep.  He has been cleaned up and now he is going to rest for a bit.  Objective: Vital signs in last 24 hours: Temp:  [97.8 F (36.6 C)-99 F (37.2 C)] 97.8 F (36.6 C) (07/07 0403) Pulse Rate:  [38-75] 70 (07/07 0732) Cardiac Rhythm: Normal sinus rhythm (07/07 0700) Resp:  [17-24] 18 (07/07 0732) BP: (97-137)/(55-77) 120/77 (07/07 0403) SpO2:  [91 %-97 %] 92 % (07/07 0732) FiO2 (%):  [28 %] 28 % (07/07 0732) Weight:  [187 lb 8 oz (85 kg)] 187 lb 8 oz (85 kg) (07/07 0403)  Intake/Output from previous day: 07/06 0701 - 07/07 0700 In: 483.1 [P.O.:480; I.V.:3.1] Out: 1965 [Urine:1925; Chest Tube:40] Intake/Output this shift: Total I/O In: 240 [P.O.:240] Out: -   General appearance: alert, cooperative and no distress Heart: regular rate and rhythm Lungs: clear to auscultation bilaterally Abdomen: soft, non-tender; bowel sounds normal; no masses,  no organomegaly Extremities: edema trace Wound: clean and dry  Lab Results:  Recent Labs  07/05/17 0430 07/06/17 0138  WBC 14.1* 13.3*  HGB 8.2* 8.1*  HCT 24.8* 24.2*  PLT 137* 180   BMET:  Recent Labs  07/05/17 0430 07/06/17 0138  NA 141 141  K 3.7 3.7  CL 111 110  CO2 24 24  GLUCOSE 85 124*  BUN 14 15  CREATININE 0.95 0.83  CALCIUM 7.8* 8.1*    PT/INR: No results for input(s): LABPROT, INR in the last 72 hours. ABG    Component Value Date/Time   PHART 7.476 (H) 07/02/2017 1735   HCO3 22.0 07/02/2017 1735   TCO2 23 07/04/2017 1720   ACIDBASEDEF 1.0 07/02/2017 1735   O2SAT 91.0  07/02/2017 1735   CBG (last 3)   Recent Labs  07/05/17 1615 07/05/17 2119 07/06/17 0613  GLUCAP 145* 86 120*    Assessment/Plan: S/P Procedure(s) (LRB): CORONARY ARTERY BYPASS GRAFTING (CABG) x4, using left mammary artery and right greater saphenous vein harvested endoscopically (N/A) TRANSESOPHAGEAL ECHOCARDIOGRAM (TEE) (N/A)  1. CV- hemodynamically stable- continue Amiodarone, Lopressor 2. Pulm- wean oxygen as tolerated, CT continues to have small air leak, CXR has no yet been completed 3. Renal- creatinine WNL, weight is trending down continue Lasix 4. Expected post operative blood loss anemia- hgb at 8.1, continue iron supplementation 5. DM- sugars are low at times, patient's a1c is 6.0... Will stop insulin restart home metformin 6. Dispo- patient stable, continue chest tube for now, small air leak remains present, CXR remains pending, wean oxygen as tolerated  LOS: 5 days    BARRETT, ERIN 07/06/2017 Patient seen and examined, agree with above Still has an air leak, lung up - keep CT on water seal  Viviann SpareSteven C. Dorris FetchHendrickson, MD [facility] [phone]

## 2017-07-06 NOTE — Progress Notes (Signed)
CARDIAC REHAB PHASE I   PRE:  Rate/Rhythm: 71 SR  BP:   Sitting: 114/70     SaO2: 2% RA 2L  MODE:  Ambulation: 422 ft   POST:  Rate/Rhythm: 89 SR  BP:   Sitting: 154/83     SaO2: 93% RA  1133-1215  Pt ambulated 44322ft with one person A, gait belt,2L O2, and rolling walker. Pt maintained a slow but steady gait. Reinforced verbal cueing for posture and overusing arms for balance on rolling walker. Pt was able to correct. Pt SpO2 remained between 88-90% while walking. Encouraged PLB and pt was able to quickly return to 90% or greater. Returned pt to recliner with LE elevated and VSS. Demonstrated proper use of IS.Pt given brochure and discussed cardiac rehab in [place]. Very pleasant man. Lunch given to pt, call bell and telephone in reach. Will f/u Monday.  Deveron Shamoon D Damiyah Ditmars,MS,ACSM-RCEP 07/06/2017 12:15 PM

## 2017-07-07 ENCOUNTER — Inpatient Hospital Stay (HOSPITAL_COMMUNITY): Payer: Medicare Other

## 2017-07-07 LAB — GLUCOSE, CAPILLARY
Glucose-Capillary: 117 mg/dL — ABNORMAL HIGH (ref 65–99)
Glucose-Capillary: 122 mg/dL — ABNORMAL HIGH (ref 65–99)
Glucose-Capillary: 159 mg/dL — ABNORMAL HIGH (ref 65–99)
Glucose-Capillary: 139 mg/dL — ABNORMAL HIGH (ref 65–99)

## 2017-07-07 MED ORDER — FUROSEMIDE 40 MG PO TABS
40.0000 mg | ORAL_TABLET | Freq: Every day | ORAL | Status: DC
  Administered 2017-07-07 – 2017-07-09 (×3): 40 mg via ORAL
  Filled 2017-07-07 (×3): qty 1

## 2017-07-07 MED ORDER — LEVALBUTEROL HCL 0.63 MG/3ML IN NEBU: 0.6300 mg | INHALATION_SOLUTION | Freq: Four times a day (QID) | RESPIRATORY_TRACT | Status: DC | PRN

## 2017-07-07 MED ORDER — LACTULOSE 10 GM/15ML PO SOLN: 20.0000 g | Freq: Every day | ORAL | Status: DC | PRN

## 2017-07-07 NOTE — Progress Notes (Signed)
Patient ambulated 450 ft on room air, oxygen sat dropped to 50, pt. Took a break halfway, O2 recovered to 90s. Second leg of his walk patient again dropped oxygen into the 60's. Considering low 02 sat, patient tolerated walk fairly well.

## 2017-07-07 NOTE — Progress Notes (Signed)
Dc'ed pacing wires pt tolerated well 

## 2017-07-07 NOTE — Progress Notes (Addendum)
      [address]       [place] [id number]             [phone]       6 Days Post-Op Procedure(s) (LRB): CORONARY ARTERY BYPASS GRAFTING (CABG) x4, using left mammary artery and right greater saphenous vein harvested endoscopically (N/A) TRANSESOPHAGEAL ECHOCARDIOGRAM (TEE) (N/A)   Subjective:  [name] has no specific complaints.  States he is doing okay.  He continues to desaturate with ambulation.. Per nursing dropped into the upper 70s  Last night.  + BM  Objective: Vital signs in last 24 hours: Temp:  [97.8 F (36.6 C)-99.1 F (37.3 C)] 97.8 F (36.6 C) (07/08 0319) Pulse Rate:  [68-82] 77 (07/08 0319) Cardiac Rhythm: Normal sinus rhythm (07/08 0700) Resp:  [16-18] 18 (07/08 0319) BP: (102-140)/(65-75) 123/75 (07/08 0319) SpO2:  [91 %-97 %] 97 % (07/08 0521) FiO2 (%):  [28 %] 28 % (07/07 1429) Weight:  [187 lb 6.4 oz (85 kg)] 187 lb 6.4 oz (85 kg) (07/08 0319)  Intake/Output from previous day: 07/07 0701 - 07/08 0700 In: 240 [P.O.:240] Out: 500 [Urine:500]  General appearance: alert, cooperative and no distress Heart: regular rate and rhythm Lungs: clear to auscultation bilaterally Abdomen: soft, non-tender; bowel sounds normal; no masses,  no organomegaly Extremities: edema trace Wound: clean and dry  Lab Results:  Recent Labs  07/05/17 0430 07/06/17 0138  WBC 14.1* 13.3*  HGB 8.2* 8.1*  HCT 24.8* 24.2*  PLT 137* 180   BMET:  Recent Labs  07/05/17 0430 07/06/17 0138  NA 141 141  K 3.7 3.7  CL 111 110  CO2 24 24  GLUCOSE 85 124*  BUN 14 15  CREATININE 0.95 0.83  CALCIUM 7.8* 8.1*    PT/INR: No results for input(s): LABPROT, INR in the last 72 hours. ABG    Component Value Date/Time   PHART 7.476 (H) 07/02/2017 1735   HCO3 22.0 07/02/2017 1735   TCO2 23 07/04/2017 1720   ACIDBASEDEF 1.0 07/02/2017 1735   O2SAT 91.0 07/02/2017 1735   CBG (last 3)   Recent Labs  07/06/17 1631 07/06/17 2103 07/07/17 0634    GLUCAP 178* 173* 117*    Assessment/Plan: S/P Procedure(s) (LRB): CORONARY ARTERY BYPASS GRAFTING (CABG) x4, using left mammary artery and right greater saphenous vein harvested endoscopically (N/A) TRANSESOPHAGEAL ECHOCARDIOGRAM (TEE) (N/A)  1. CV- NSR, BP controlled- continue Amiodarone, Lopressor 2. Pulm- unable to wean oxygen, continues to desat with ambulation... CXR is free from pneumothorax, no significant pleural effusions,     CT is free from air leak will discuss management with Dr. Dorris FetchHendrickson 3. Renal- creatinine has been stable, weight is mildly elevated, will start oral Lasix at 40 mg daily 4. Dm- sugars well controlled 5. Dispo- patient stable, no air leak from chest tube possibly remove today, unable to wean oxygen due to low sats with ambulation, d/c EPW, continue current care   LOS: 6 days    BARRETT, ERIN 07/07/2017 Patient seen and examined, agree with above Looks good No air leak on exam today. Check CXR again in Am- if Ok dc chest tube Still requiring some supplemental O2- agree with PO lasix  Viviann SpareSteven C. Dorris FetchHendrickson, MD [facility] [phone]

## 2017-07-08 ENCOUNTER — Inpatient Hospital Stay (HOSPITAL_COMMUNITY): Payer: Medicare Other

## 2017-07-08 LAB — GLUCOSE, CAPILLARY
Glucose-Capillary: 108 mg/dL — ABNORMAL HIGH (ref 65–99)
Glucose-Capillary: 98 mg/dL (ref 65–99)
Glucose-Capillary: 127 mg/dL — ABNORMAL HIGH (ref 65–99)
Glucose-Capillary: 118 mg/dL — ABNORMAL HIGH (ref 65–99)

## 2017-07-08 MED ORDER — POTASSIUM CHLORIDE CRYS ER 20 MEQ PO TBCR
20.0000 meq | EXTENDED_RELEASE_TABLET | Freq: Once | ORAL | Status: AC
  Administered 2017-07-08: 20 meq via ORAL
  Filled 2017-07-08: qty 1

## 2017-07-08 MED ORDER — FUROSEMIDE 40 MG PO TABS: 40.0000 mg | ORAL_TABLET | Freq: Every day | ORAL | Status: DC

## 2017-07-08 MED ORDER — POTASSIUM CHLORIDE CRYS ER 20 MEQ PO TBCR: 20.0000 meq | EXTENDED_RELEASE_TABLET | Freq: Every day | ORAL | Status: DC

## 2017-07-08 NOTE — Progress Notes (Addendum)
      [address]       [place] [id number]             [phone]        7 Days Post-Op Procedure(s) (LRB): CORONARY ARTERY BYPASS GRAFTING (CABG) x4, using left mammary artery and right greater saphenous vein harvested endoscopically (N/A) TRANSESOPHAGEAL ECHOCARDIOGRAM (TEE) (N/A)  Subjective: Patient about to eat breakfast. He states his breathing is ok.  Objective: Vital signs in last 24 hours: Temp:  [98.2 F (36.8 C)-98.7 F (37.1 C)] 98.2 F (36.8 C) (07/09 0532) Pulse Rate:  [70-88] 70 (07/09 0532) Cardiac Rhythm: Normal sinus rhythm (07/08 1940) Resp:  [18-20] 20 (07/09 0532) BP: (107-122)/(62-80) 122/67 (07/09 0532) SpO2:  [91 %-97 %] 92 % (07/09 0532) Weight:  [84.5 kg (186 lb 4.8 oz)] 84.5 kg (186 lb 4.8 oz) (07/09 0532)  Pre op weight 84 kg Current Weight  07/08/17 84.5 kg (186 lb 4.8 oz)      Intake/Output from previous day: 07/08 0701 - 07/09 0700 In: 720 [P.O.:720] Out: 1475 [Urine:1475]   Physical Exam:  Cardiovascular: RRR Pulmonary: Diminished at bases Abdomen: Soft, non tender, bowel sounds present. Extremities: Trace  bilateral lower extremity edema. Wounds: Clean and dry.  No erythema or signs of infection. Minor serous drainage RLE wound. Chest tube: to water seal, tidling with cough but no true air leak  Lab Results: CBC: Recent Labs  07/06/17 0138  WBC 13.3*  HGB 8.1*  HCT 24.2*  PLT 180   BMET:  Recent Labs  07/06/17 0138  NA 141  K 3.7  CL 110  CO2 24  GLUCOSE 124*  BUN 15  CREATININE 0.83  CALCIUM 8.1*    PT/INR:  Lab Results  Component Value Date   INR 1.10 06/18/2017   INR 1.0 05/30/2017   ABG:  INR: Will add last result for INR, ABG once components are confirmed Will add last 4 CBG results once components are confirmed  Assessment/Plan:  1. CV - SR in the 70's. On Lopressor 12.5 mg bid, Amiodarone 200 mg bid 2.  Pulmonary - Chest tube to water seal. There is no air leak. CXR this  am appears to show subcutaneous emphysema right lateral chest wall, small pleural effusions and atelectasis. Likely remove chest tube today. On 1 liter via oxygen. Continues to desat with ambulation. Will likely need oxygen at discharge.  Encourage incentive spirometer. 3.  Acute blood loss anemia - Last H and H 8.1 and 24.2. Continue Trinsicon 4. DM-CBGs 159/139/108. On Metformin 500 mg bid and Insulin. Pre op HGA1C 6.1. 5. Restart oral Lasix for volume overload 6. Will need SNF when ready for discharge-likely 1-2 days  ZIMMERMAN,DONIELLE MPA-C 07/08/2017,7:13 AM  Chest tube is out. Patient walking hallway comfortably on nasal cannula Patient will need skilled nursing facility and [facility]. Wean oxygen as tolerated patient examined and medical record reviewed,agree with above note. Kathlee Nationseter Van Trigt III 07/08/2017

## 2017-07-08 NOTE — Discharge Instructions (Signed)
We ask the SNF to please do the following: °1. Please obtain vital signs at least one time daily °2.Please weigh the patient daily. If he or she continues to gain weight or develops lower extremity edema, contact the office at [phone]. °3. Ambulate patient at least three times daily and please use sternal precautions. ° °Coronary Artery Bypass Grafting, Care After °This sheet gives you information about how to care for yourself after your procedure. Your health care provider may also give you more specific instructions. If you have problems or questions, contact your health care provider. °What can I expect after the procedure? °After the procedure, it is common to have: °· Nausea and a lack of appetite. °· Constipation. °· Weakness and fatigue. °· Depression or irritability. °· Pain or discomfort in your incision areas. ° °Follow these instructions at home: °Medicines °· Take over-the-counter and prescription medicines only as told by your health care provider. Do not stop taking medicines or start any new medicines without approval from your health care provider. °· If you were prescribed an antibiotic medicine, take it as told by your health care provider. Do not stop taking the antibiotic even if you start to feel better. °· Do not drive or use heavy machinery while taking prescription pain medicine. °Incision care °· Follow instructions from your health care provider about how to take care of your incisions. Make sure you: °? Wash your hands with soap and water before you change your bandage (dressing). If soap and water are not available, use hand sanitizer. °? Change your dressing as told by your health care provider. °? Leave stitches (sutures), skin glue, or adhesive strips in place. These skin closures may need to stay in place for 2 weeks or longer. If adhesive strip edges start to loosen and curl up, you may trim the loose edges. Do not remove adhesive strips completely unless your health care  provider tells you to do that. °· Keep incision areas clean, dry, and protected. °· Check your incision areas every day for signs of infection. Check for: °? More redness, swelling, or pain. °? More fluid or blood. °? Warmth. °? Pus or a bad smell. °· If incisions were made in your legs: °? Avoid crossing your legs. °? Avoid sitting for long periods of time. Change positions every 30 minutes. °? Raise (elevate) your legs when you are sitting. °Bathing °· Do not take baths, swim, or use a hot tub until your health care provider approves. °· Only take sponge baths. Pat the incisions dry. Do not rub incisions with a washcloth or towel. °· Ask your health care provider when you can shower. °Eating and drinking °· Eat foods that are high in fiber, such as raw fruits and vegetables, whole grains, beans, and nuts. Meats should be lean cut. Avoid canned, processed, and fried foods. This can help prevent constipation and is a recommended part of a heart-healthy diet. °· Drink enough fluid to keep your urine clear or pale yellow. °· Limit alcohol intake to no more than 1 drink a day for nonpregnant women and 2 drinks a day for men. One drink equals 12 oz of beer, 5 oz of wine, or 1½ oz of hard liquor. °Activity °· Rest and limit your activity as told by your health care provider. You may be instructed to: °? Stop any activity right away if you have chest pain, shortness of breath, irregular heartbeats, or dizziness. Get help right away if you have any of these   symptoms. ? Move around frequently for short periods or take short walks as directed by your health care provider. Gradually increase your activities. You may need physical therapy or cardiac rehabilitation to help strengthen your muscles and build your endurance. ? Avoid lifting, pushing, or pulling anything that is heavier than 10 lb (4.5 kg) for at least 6 weeks or as told by your health care provider.  Do not drive until your health care provider  approves.  Ask your health care provider when you may return to work.  Ask your health care provider when you may resume sexual activity. General instructions  Do not use any products that contain nicotine or tobacco, such as cigarettes and e-cigarettes. If you need help quitting, ask your health care provider.  Take 2-3 deep breaths every few hours during the day, while you recover. This helps expand your lungs and prevent complications like pneumonia after surgery.  If you were given a device called an incentive spirometer, use it several times a day to practice deep breathing. Support your chest with a pillow or your arms when you take deep breaths or cough.  Wear compression stockings as told by your health care provider. These stockings help to prevent blood clots and reduce swelling in your legs.  Weigh yourself every day. This helps identify if your body is holding (retaining) fluid that may make your heart and lungs work harder.  Keep all follow-up visits as told by your health care provider. This is important. Contact a health care provider if:  You have more redness, swelling, or pain around any incision.  You have more fluid or blood coming from any incision.  Any incision feels warm to the touch.  You have pus or a bad smell coming from any incision  You have a fever.  You have swelling in your ankles or legs.  You have pain in your legs.  You gain 2 lb (0.9 kg) or more a day.  You are nauseous or you vomit.  You have diarrhea. Get help right away if:  You have chest pain that spreads to your jaw or arms.  You are short of breath.  You have a fast or irregular heartbeat.  You notice a "clicking" in your breastbone (sternum) when you move.  You have numbness or weakness in your arms or legs.  You feel dizzy or light-headed. Summary  After the procedure, it is common to have pain or discomfort in the incision areas.  Do not take baths, swim, or use a  hot tub until your health care provider approves.  Gradually increase your activities. You may need physical therapy or cardiac rehabilitation to help strengthen your muscles and build your endurance.  Weigh yourself every day. This helps identify if your body is holding (retaining) fluid that may make your heart and lungs work harder. This information is not intended to replace advice given to you by your health care provider. Make sure you discuss any questions you have with your health care provider. Document Released: 07/06/2005 Document Revised: 11/05/2016 Document Reviewed: 11/05/2016 Elsevier Interactive Patient Education  2018 [employer] Inc.   Endoscopic Saphenous Vein Harvesting, Care After Refer to this sheet in the next few weeks. These instructions provide you with information about caring for yourself after your procedure. Your health care provider may also give you more specific instructions. Your treatment has been planned according to current medical practices, but problems sometimes occur. Call your health care provider if you have any problems  or questions after your procedure. °What can I expect after the procedure? °After the procedure, it is common to have: °· Pain. °· Bruising. °· Swelling. °· Numbness. ° °Follow these instructions at home: °Medicine °· Take over-the-counter and prescription medicines only as told by your health care provider. °· Do not drive or operate heavy machinery while taking prescription pain medicine. °Incision care ° °· Follow instructions from your health care provider about how to take care of the cut made during surgery (incision). Make sure you: °? Wash your hands with soap and water before you change your bandage (dressing). If soap and water are not available, use hand sanitizer. °? Change your dressing as told by your health care provider. °? Leave stitches (sutures), skin glue, or adhesive strips in place. These skin closures may need to be in place  for 2 weeks or longer. If adhesive strip edges start to loosen and curl up, you may trim the loose edges. Do not remove adhesive strips completely unless your health care provider tells you to do that. °· Check your incision area every day for signs of infection. Check for: °? More redness, swelling, or pain. °? More fluid or blood. °? Warmth. °? Pus or a bad smell. °General instructions °· Raise (elevate) your legs above the level of your heart while you are sitting or lying down. °· Do any exercises your health care providers have given you. These may include deep breathing, coughing, and walking exercises. °· Do not shower, take baths, swim, or use a hot tub unless told by your health care provider. °· Wear your elastic stocking if told by your health care provider. °· Keep all follow-up visits as told by your health care provider. This is important. °Contact a health care provider if: °· Medicine does not help your pain. °· Your pain gets worse. °· You have new leg bruises or your leg bruises get bigger. °· You have a fever. °· Your leg feels numb. °· You have more redness, swelling, or pain around your incision. °· You have more fluid or blood coming from your incision. °· Your incision feels warm to the touch. °· You have pus or a bad smell coming from your incision. °Get help right away if: °· Your pain is severe. °· You develop pain, tenderness, warmth, redness, or swelling in any part of your leg. °· You have chest pain. °· You have trouble breathing. °This information is not intended to replace advice given to you by your health care provider. Make sure you discuss any questions you have with your health care provider. °Document Released: 08/29/2011 Document Revised: 05/24/2016 Document Reviewed: 10/31/2015 °Elsevier Interactive Patient Education © 2018 Elsevier Inc. ° °

## 2017-07-08 NOTE — Clinical Social Work Note (Signed)
Clinical Social Work Assessment  Patient Details  Name: Brendan [name] MRN: [id number] Date of Birth: 1951-02-18  Date of referral:  07/08/17               Reason for consult:  Facility Placement, Discharge Planning                Permission sought to share information with:  Family Supports Permission granted to share information::  Yes, Verbal Permission Granted  Name::     Brendan [name]  Agency::  SNF  Relationship::  daughter  Contact Information:  [phone]  Housing/Transportation Living arrangements for the past 2 months:  [place] of Information:  Patient Patient Interpreter Needed:  None Criminal Activity/Legal Involvement Pertinent to Current Situation/Hospitalization:  No - Comment as needed Significant Relationships:  None Lives with:  Self Do you feel safe going back to the place where you live?  Yes Need for family participation in patient care:  No (Coment)  Care giving concerns:  No family at bedside. Patient stated he lives at home by himself and has family in the area for support.   Social Worker assessment / plan:  Holiday representative met patient at bedside to offer support and discuss discharge options. Patient stated he is agreeable to discharge to a SNF for a couple of days before returning home. Patient stated he would prefer to be in the [place] area and would like to be placed at [facility]. CSW will follow up with patient once bed offers are available   Employment status:  Part-Time Insurance information:  Medicare PT Recommendations:  [facility] / Referral to community resources:  [place]  Patient/Family's Response to care: Patient in a good mood and was very talkative. Patient appreciates CSW role in care   Patient/Family's Understanding of and Emotional Response to Diagnosis, Current Treatment, and Prognosis:  Patient agreeable to discharge to snf   Emotional Assessment Appearance:   Appears stated age Attitude/Demeanor/Rapport:  Other Affect (typically observed):  Appropriate, Happy, Pleasant Orientation:  Oriented to Self, Oriented to Situation, Oriented to Place, Oriented to  Time Alcohol / Substance use:  Not Applicable Psych involvement (Current and /or in the community):  No (Comment)  Discharge Needs  Concerns to be addressed:  No discharge needs identified Readmission within the last 30 days:  No Current discharge risk:  None Barriers to Discharge:  No Barriers Identified   Wende Neighbors, LCSW 07/08/2017, 10:29 AM

## 2017-07-08 NOTE — [place] FL2 (Signed)
[place] MEDICAID FL2 LEVEL OF CARE SCREENING TOOL     IDENTIFICATION  Patient Name: Brendan [name] Birthdate: December 05, 1951 Sex: male Admission Date (Current Location): 07/01/2017  [facility] and [place] Number:  Producer, television/film/video and Address:  [facility], [address] [place], [place] [address]      Provider Number: [id number]  Attending Physician Name and Address:  Kerin Perna, MD  Relative Name and Phone Number:  Micheline Maze, [phone]    Current Level of Care: Hospital Recommended Level of Care: Skilled Nursing Facility Prior Approval Number:    Date Approved/Denied:   PASRR Number: [id number]  Discharge Plan: SNF    Current Diagnoses: Patient Active Problem List   Diagnosis Date Noted  . S/P CABG x 4 07/05/2017  . Coronary artery disease 07/01/2017  . Stable angina (HCC) 04/02/2017  . Advanced care planning/counseling discussion 03/19/2017  . COPD (chronic obstructive pulmonary disease) (HCC) 02/18/2017  . CAD (coronary artery disease) 06/05/2016  . Personal history of tobacco use, presenting hazards to health 05/03/2016  . Allergic rhinitis 03/26/2016  . Nocturia 02/29/2016  . Peripheral vascular disease (HCC) 11/23/2015  . ED (erectile dysfunction) 11/23/2015  . Hypertension 11/23/2015  . Diabetes mellitus type 2, controlled, without complications (HCC) 11/23/2015  . Hyperlipidemia 11/23/2015  . Tobacco use disorder 11/23/2015  . Constipation 11/23/2015    Orientation RESPIRATION BLADDER Height & Weight     Self, Time, Situation, Place  O2 Continent Weight: 186 lb 4.8 oz (84.5 kg) Height:  6' 1.5" (186.7 cm)  BEHAVIORAL SYMPTOMS/MOOD NEUROLOGICAL BOWEL NUTRITION STATUS      Continent Diet (heart healthy/carb modified )  AMBULATORY STATUS COMMUNICATION OF NEEDS Skin   Limited Assist Verbally Normal                       Personal Care Assistance Level of Assistance  Bathing, Feeding, Dressing Bathing  Assistance: Limited assistance Feeding assistance: Independent Dressing Assistance: Limited assistance     Functional Limitations Info  Sight, Hearing, Speech Sight Info: Adequate Hearing Info: Adequate Speech Info: Adequate    SPECIAL CARE FACTORS FREQUENCY  PT (By licensed PT), OT (By licensed OT)     PT Frequency: 5x wk OT Frequency: 5x wk            Contractures Contractures Info: Not present    Additional Factors Info  Code Status, Allergies Code Status Info: Full Code Allergies Info: No Known Allergies           Current Medications (07/08/2017):  This is the current hospital active medication list Current Facility-Administered Medications  Medication Dose Route Frequency Provider Last Rate Last Dose  . 0.45 % sodium chloride infusion   Intravenous Continuous PRN Ardelle Balls, PA-C   Stopped at 07/03/17 0000  . 0.9 %  sodium chloride infusion  250 mL Intravenous Continuous Doree Fudge M, PA-C 1 mL/hr at 07/02/17 0700 250 mL at 07/02/17 0700  . 0.9 %  sodium chloride infusion   Intravenous Continuous Ardelle Balls, PA-C 10 mL/hr at 07/04/17 1724 10 mL/hr at 07/04/17 1724  . 0.9 %  sodium chloride infusion  250 mL Intravenous PRN Donata Clay, Theron Arista, MD      . amiodarone (PACERONE) tablet 200 mg  200 mg Oral BID Donata Clay, Theron Arista, MD   200 mg at 07/08/17 1054  . aspirin EC tablet 325 mg  325 mg Oral Daily Doree Fudge M, PA-C   325 mg  at 07/06/17 1040   Or  . aspirin chewable tablet 324 mg  324 mg Per Tube Daily Doree FudgeZimmerman, Donielle M, PA-C   324 mg at 07/07/17 1024  . atorvastatin (LIPITOR) tablet 40 mg  40 mg Oral Daily Doree FudgeZimmerman, Donielle M, PA-C   40 mg at 07/07/17 1025  . azelastine (ASTELIN) 0.1 % nasal spray 2 spray  2 spray Each Nare QHS Joycelyn ManZimmerman, Donielle M, PA-C      . bisacodyl (DULCOLAX) EC tablet 10 mg  10 mg Oral Daily Doree FudgeZimmerman, Donielle M, PA-C   10 mg at 07/05/17 [id number]   Or  . bisacodyl (DULCOLAX) suppository 10 mg  10 mg  Rectal Daily Doree FudgeZimmerman, Donielle M, PA-C      . docusate sodium (COLACE) capsule 200 mg  200 mg Oral Daily Doree FudgeZimmerman, Donielle M, PA-C   200 mg at 07/08/17 1054  . enoxaparin (LOVENOX) injection 30 mg  30 mg Subcutaneous Q24H Donata ClayVan Trigt, Theron AristaPeter, MD   30 mg at 07/07/17 2059  . ferrous fumarate-b12-vitamic C-folic acid (TRINSICON / FOLTRIN) capsule 1 capsule  1 capsule Oral TID PC Donata ClayVan Trigt, Theron AristaPeter, MD   1 capsule at 07/07/17 1646  . furosemide (LASIX) tablet 40 mg  40 mg Oral Daily Barrett, Erin R, PA-C   40 mg at 07/07/17 1024  . guaiFENesin (MUCINEX) 12 hr tablet 600 mg  600 mg Oral BID Kerin PernaVan Trigt, Peter, MD   600 mg at 07/07/17 2100  . insulin aspart (novoLOG) injection 0-24 Units  0-24 Units Subcutaneous TID AC & HS Kerin PernaVan Trigt, Peter, MD   4 Units at 07/06/17 2116  . lactulose (CHRONULAC) 10 GM/15ML solution 20 g  20 g Oral Daily PRN Barrett, Erin R, PA-C      . levalbuterol (XOPENEX) nebulizer solution 0.63 mg  0.63 mg Nebulization Q6H PRN Donata ClayVan Trigt, Theron AristaPeter, MD      . magnesium hydroxide (MILK OF MAGNESIA) suspension 30 mL  30 mL Oral Daily PRN Kerin PernaVan Trigt, Peter, MD      . MEDLINE mouth rinse  15 mL Mouth Rinse BID Kerin PernaVan Trigt, Peter, MD   15 mL at 07/07/17 2101  . metFORMIN (GLUCOPHAGE) tablet 500 mg  500 mg Oral BID WC Barrett, Erin R, PA-C   500 mg at 07/08/17 0656  . metoprolol tartrate (LOPRESSOR) tablet 12.5 mg  12.5 mg Oral BID Doree FudgeZimmerman, Donielle M, PA-C   12.5 mg at 07/07/17 2100   Or  . metoprolol tartrate (LOPRESSOR) 25 mg/10 mL oral suspension 12.5 mg  12.5 mg Per Tube BID Doree FudgeZimmerman, Donielle M, PA-C      . metoprolol tartrate (LOPRESSOR) injection 2.5-5 mg  2.5-5 mg Intravenous Q2H PRN Doree FudgeZimmerman, Donielle M, PA-C      . mometasone-formoterol (DULERA) 100-5 MCG/ACT inhaler 2 puff  2 puff Inhalation BID Ardelle BallsZimmerman, Donielle M, PA-C   2 puff at 07/08/17 [id number]  . montelukast (SINGULAIR) tablet 10 mg  10 mg Oral QHS Doree FudgeZimmerman, Donielle M, PA-C   10 mg at 07/07/17 2100  . multivitamin with minerals  tablet 1 tablet  1 tablet Oral Daily Doree FudgeZimmerman, Donielle M, PA-C   1 tablet at 07/07/17 1025  . nicotine (NICODERM CQ - dosed in mg/24 hours) patch 21 mg  21 mg Transdermal Daily Donata ClayVan Trigt, Theron AristaPeter, MD   21 mg at 07/07/17 1000  . oxyCODONE (Oxy IR/ROXICODONE) immediate release tablet 5-10 mg  5-10 mg Oral Q3H PRN Ardelle BallsZimmerman, Donielle M, PA-C   10 mg at 07/07/17 2100  . pantoprazole (PROTONIX) EC  tablet 40 mg  40 mg Oral Daily Doree Fudge M, PA-C   40 mg at 07/07/17 1025  . potassium chloride SA (K-DUR,KLOR-CON) CR tablet 20 mEq  20 mEq Oral Daily Donata Clay, Theron Arista, MD   20 mEq at 07/07/17 1024  . sodium chloride flush (NS) 0.9 % injection 10-40 mL  10-40 mL Intracatheter Q12H Donata Clay, Theron Arista, MD   10 mL at 07/05/17 0956  . sodium chloride flush (NS) 0.9 % injection 10-40 mL  10-40 mL Intracatheter PRN Donata Clay, Theron Arista, MD      . sodium chloride flush (NS) 0.9 % injection 3 mL  3 mL Intravenous Q12H Doree Fudge M, PA-C   3 mL at 07/07/17 1000  . sodium chloride flush (NS) 0.9 % injection 3 mL  3 mL Intravenous PRN Ardelle Balls, PA-C   3 mL at 07/02/17 0948  . sodium chloride flush (NS) 0.9 % injection 3 mL  3 mL Intravenous Q12H Donata Clay, Theron Arista, MD   3 mL at 07/07/17 1000  . sodium chloride flush (NS) 0.9 % injection 3 mL  3 mL Intravenous PRN Kerin Perna, MD   3 mL at 07/07/17 2106  . tamsulosin (FLOMAX) capsule 0.4 mg  0.4 mg Oral QPC supper Doree Fudge M, PA-C   0.4 mg at 07/07/17 1647  . traMADol (ULTRAM) tablet 50 mg  50 mg Oral Q4H PRN Ardelle Balls, PA-C         Discharge Medications: Please see discharge summary for a list of discharge medications.  Relevant Imaging Results:  Relevant Lab Results:   Additional Information SS#[id number]  Althea Charon, LCSW

## 2017-07-08 NOTE — Progress Notes (Signed)
CARDIAC REHAB PHASE I   PRE:  Rate/Rhythm: 79 SR    BP: sitting 116/77    SaO2: 90 1L  MODE:  Ambulation: 550 ft   POST:  Rate/Rhythm: 97 SR    BP: sitting 122/77     SaO2: 85 3L --> 90 4L  Pt moving well, able to stand independently. Steady walking with RW, reminders to stand tall. Used 3L. Pt conversant during walk, seemed to be tolerating well, however after 400 ft SaO2 check was 81-83 3L. Pursed lip breathing brought SaO2 up to 90 3L. Pt walked 150 more ft, return to recliner, and SAO2 was 85 3L (checked on two different pulse oxs, waveform good). Up to 90 4L with pursed lip breathing. Pt inspiring 1500-1700 mL on IS. Encouraged more walking. Pt sts PTA he would get significant SOB with exertion and then lightheaded and have to sit down to rest. Will f/u. [id number]   Harriet Massonandi Kristan Biviana Saddler CES, ACSM 07/08/2017 9:21 AM

## 2017-07-09 ENCOUNTER — Inpatient Hospital Stay (HOSPITAL_COMMUNITY): Payer: Medicare Other

## 2017-07-09 DIAGNOSIS — F17211 Nicotine dependence, cigarettes, in remission: Secondary | ICD-10-CM | POA: Diagnosis not present

## 2017-07-09 DIAGNOSIS — M791 Myalgia: Secondary | ICD-10-CM | POA: Diagnosis not present

## 2017-07-09 DIAGNOSIS — I2510 Atherosclerotic heart disease of native coronary artery without angina pectoris: Secondary | ICD-10-CM | POA: Diagnosis not present

## 2017-07-09 DIAGNOSIS — E785 Hyperlipidemia, unspecified: Secondary | ICD-10-CM | POA: Diagnosis not present

## 2017-07-09 DIAGNOSIS — Z7984 Long term (current) use of oral hypoglycemic drugs: Secondary | ICD-10-CM | POA: Diagnosis not present

## 2017-07-09 DIAGNOSIS — R498 Other voice and resonance disorders: Secondary | ICD-10-CM | POA: Diagnosis not present

## 2017-07-09 DIAGNOSIS — D508 Other iron deficiency anemias: Secondary | ICD-10-CM | POA: Diagnosis not present

## 2017-07-09 DIAGNOSIS — R0602 Shortness of breath: Secondary | ICD-10-CM | POA: Diagnosis not present

## 2017-07-09 DIAGNOSIS — I5022 Chronic systolic (congestive) heart failure: Secondary | ICD-10-CM | POA: Diagnosis not present

## 2017-07-09 DIAGNOSIS — E876 Hypokalemia: Secondary | ICD-10-CM | POA: Diagnosis not present

## 2017-07-09 DIAGNOSIS — M6281 Muscle weakness (generalized): Secondary | ICD-10-CM | POA: Diagnosis not present

## 2017-07-09 DIAGNOSIS — E119 Type 2 diabetes mellitus without complications: Secondary | ICD-10-CM | POA: Diagnosis not present

## 2017-07-09 DIAGNOSIS — J90 Pleural effusion, not elsewhere classified: Secondary | ICD-10-CM | POA: Diagnosis not present

## 2017-07-09 DIAGNOSIS — I240 Acute coronary thrombosis not resulting in myocardial infarction: Secondary | ICD-10-CM | POA: Diagnosis not present

## 2017-07-09 DIAGNOSIS — I10 Essential (primary) hypertension: Secondary | ICD-10-CM | POA: Diagnosis not present

## 2017-07-09 DIAGNOSIS — D649 Anemia, unspecified: Secondary | ICD-10-CM | POA: Diagnosis not present

## 2017-07-09 DIAGNOSIS — I4891 Unspecified atrial fibrillation: Secondary | ICD-10-CM | POA: Diagnosis not present

## 2017-07-09 DIAGNOSIS — J449 Chronic obstructive pulmonary disease, unspecified: Secondary | ICD-10-CM | POA: Diagnosis not present

## 2017-07-09 DIAGNOSIS — I480 Paroxysmal atrial fibrillation: Secondary | ICD-10-CM | POA: Diagnosis not present

## 2017-07-09 DIAGNOSIS — J9811 Atelectasis: Secondary | ICD-10-CM | POA: Diagnosis not present

## 2017-07-09 DIAGNOSIS — Z5189 Encounter for other specified aftercare: Secondary | ICD-10-CM | POA: Diagnosis not present

## 2017-07-09 LAB — GLUCOSE, CAPILLARY
Glucose-Capillary: 105 mg/dL — ABNORMAL HIGH (ref 65–99)
Glucose-Capillary: 98 mg/dL (ref 65–99)

## 2017-07-09 MED ORDER — FERROUS SULFATE 325 (65 FE) MG PO TABS: 325.0000 mg | ORAL_TABLET | Freq: Every day | ORAL | 3 refills | Status: DC

## 2017-07-09 MED ORDER — ADULT MULTIVITAMIN W/MINERALS CH: 1.0000 | ORAL_TABLET | Freq: Every day | ORAL | Status: DC

## 2017-07-09 MED ORDER — AMIODARONE HCL 200 MG PO TABS: 200.0000 mg | ORAL_TABLET | Freq: Every day | ORAL | Status: DC

## 2017-07-09 MED ORDER — FUROSEMIDE 40 MG PO TABS: 40.0000 mg | ORAL_TABLET | Freq: Every day | ORAL | Status: DC

## 2017-07-09 MED ORDER — NICOTINE 21 MG/24HR TD PT24: 21.0000 mg | MEDICATED_PATCH | Freq: Every day | TRANSDERMAL | 0 refills | Status: DC

## 2017-07-09 MED ORDER — OXYCODONE HCL 5 MG PO TABS: ORAL_TABLET | ORAL | 0 refills | Status: DC

## 2017-07-09 MED ORDER — GUAIFENESIN ER 600 MG PO TB12: 600.0000 mg | ORAL_TABLET | Freq: Two times a day (BID) | ORAL | Status: DC | PRN

## 2017-07-09 MED ORDER — POTASSIUM CHLORIDE CRYS ER 20 MEQ PO TBCR: 20.0000 meq | EXTENDED_RELEASE_TABLET | Freq: Every day | ORAL | Status: DC

## 2017-07-09 MED ORDER — METOPROLOL TARTRATE 25 MG PO TABS: 12.5000 mg | ORAL_TABLET | Freq: Two times a day (BID) | ORAL | Status: DC

## 2017-07-09 MED ORDER — ACETAMINOPHEN 500 MG PO TABS: 500.0000 mg | ORAL_TABLET | Freq: Four times a day (QID) | ORAL | 0 refills | Status: AC | PRN

## 2017-07-09 MED ORDER — ASPIRIN 325 MG PO TBEC: 325.0000 mg | DELAYED_RELEASE_TABLET | Freq: Every day | ORAL | 0 refills | Status: DC

## 2017-07-09 NOTE — Progress Notes (Signed)
Order received to discharge patient to skilled nursing facility.  Transport has been called and report called to facility.

## 2017-07-09 NOTE — Evaluation (Signed)
Physical Therapy Evaluation Patient Details Name: Brendan [name] MRN: [id number] DOB: [birth year] Today's Date: 07/09/2017   History of Present Illness  Pt is a 66 y/o male s/p CABG X 4 secondary to CAD. PMH includes HTN, DM, exertional chest pain, CAD, PVD,current smoker, and s/p back and knee surgery.   Clinical Impression  Pt s/p surgery above with deficits below. PTA, pt was independent and still working 20 hours/week. Upon evaluation, pt presenting with decreased knowledge of precautions, decreased strength in BLE, pain in RLE, decreased balance, and decreased endurance. Pt requiring min A for short distance secondary to R knee buckling during gait. Pt with decreased oxygen sats during ambulation (see ambulation section for details). Decreased sats to 85% on 4L of oxygen during ambulation. Increased to 91% on 4L with seated rest and cues for pursed lip breathing. Pt required mod A for basic transfers secondary to RLE pain and weakness. Pt currently lives alone and not safe to go home. Will need short term SNF at d/c to increase independence and safety with functional mobility. Will continue to follow acutely to maximize functional mobility independence.     Follow Up Recommendations SNF    Equipment Recommendations  None recommended by PT    Recommendations for Other Services       Precautions / Restrictions Precautions Precautions: Sternal Precaution Comments: Reviewed precautions with pt and administered precaution handout.  Restrictions Weight Bearing Restrictions:  (sternal precautions )      Mobility  Bed Mobility               General bed mobility comments: In chair upon entry.   Transfers Overall transfer level: Needs assistance Equipment used: Rolling walker (2 wheeled) Transfers: Sit to/from Stand Sit to Stand: Mod assist         General transfer comment: Required verbal cues to maintain sternal precautions as pt wanted to pull up with arms. Mod A for lift  assist and steadying once standing secondary to RLE pain. Used pillow against chest during standing. Required 2 attempts to stand.   Ambulation/Gait Ambulation/Gait assistance: Min assist Ambulation Distance (Feet): 50 Feet Assistive device: Rolling walker (2 wheeled) Gait Pattern/deviations: Step-through pattern;Decreased step length - right;Decreased step length - left;Decreased weight shift to right;Decreased dorsiflexion - right;Antalgic;Trunk flexed Gait velocity: Decreased Gait velocity interpretation: Below normal speed for age/gender General Gait Details: Slow, antalgic gait seconday to RLE pain. Noted RLE knee buckling during ambulation secondary to pain. Required min A for steadying. Verbal cues to tighten quads during gait, however, pt having difficulty secondary to RLE pain. Oxygen levels down to 82% on 2L, required 4L to elevate sats back to 90%. With 50', oxygen sats decreased to 85% on 4L, however, elevated to 91% with seated rest and pursed lip breathing.   Stairs            Wheelchair Mobility    Modified Rankin (Stroke Patients Only)       Balance Overall balance assessment: Needs assistance Sitting-balance support: No upper extremity supported;Feet supported Sitting balance-Leahy Scale: Fair     Standing balance support: Bilateral upper extremity supported;During functional activity Standing balance-Leahy Scale: Poor Standing balance comment: Reliant on external support for steadying                              Pertinent Vitals/Pain Pain Assessment: 0-10 Pain Score: 10-Worst pain ever Pain Location: R leg when he stands  Pain Descriptors / Indicators: Aching;Operative  site guarding;Sore Pain Intervention(s): Limited activity within patient's tolerance;Monitored during session;Repositioned    Home Living Family/patient expects to be discharged to:: Skilled nursing facility Living Arrangements: Alone                    Prior  Function Level of Independence: Independent         Comments: Still working      Hand Dominance   Dominant Hand: Right    Extremity/Trunk Assessment   Upper Extremity Assessment Upper Extremity Assessment: Generalized weakness    Lower Extremity Assessment Lower Extremity Assessment: RLE deficits/detail RLE Deficits / Details: pain and swelling secondary to incision from graft site. Reports he cannot straighten out and cannot bear alot of weight on it.     Cervical / Trunk Assessment Cervical / Trunk Assessment: Kyphotic  Communication   Communication: No difficulties  Cognition Arousal/Alertness: Awake/alert Behavior During Therapy: WFL for tasks assessed/performed Overall Cognitive Status: Within Functional Limits for tasks assessed                                        General Comments General comments (skin integrity, edema, etc.): Pt currently lives alone and requires increased assist for safety with ambulation. Also with decreased oxygen sats and only able to tolerate short ambulation distances. Educated about SNF and pt agreeable.     Exercises General Exercises - Lower Extremity Ankle Circles/Pumps: AROM;Right;10 reps;Supine Quad Sets: AROM;Right;10 reps;Supine   Assessment/Plan    PT Assessment Patient needs continued PT services  PT Problem List Decreased strength;Decreased range of motion;Decreased activity tolerance;Decreased balance;Decreased mobility;Decreased knowledge of use of DME;Decreased knowledge of precautions;Cardiopulmonary status limiting activity;Pain       PT Treatment Interventions DME instruction;Gait training;Functional mobility training;Therapeutic activities;Neuromuscular re-education;Therapeutic exercise;Balance training;Patient/family education    PT Goals (Current goals can be found in the Care Plan section)  Acute Rehab PT Goals Patient Stated Goal: to get my R leg back to going like it was  PT Goal  Formulation: With patient Time For Goal Achievement: 07/23/17 Potential to Achieve Goals: Good    Frequency Min 2X/week   Barriers to discharge Decreased caregiver support Lives alone     Co-evaluation               AM-PAC PT "6 Clicks" Daily Activity  Outcome Measure Difficulty turning over in bed (including adjusting bedclothes, sheets and blankets)?: A Lot Difficulty moving from lying on back to sitting on the side of the bed? : A Lot Difficulty sitting down on and standing up from a chair with arms (e.g., wheelchair, bedside commode, etc,.)?: Total Help needed moving to and from a bed to chair (including a wheelchair)?: A Little Help needed walking in hospital room?: A Little Help needed climbing 3-5 steps with a railing? : A Lot 6 Click Score: 13    End of Session Equipment Utilized During Treatment: Gait belt;Oxygen Activity Tolerance: Treatment limited secondary to medical complications (Comment) (decreased oxygen sats ) Patient left: in chair;with call bell/phone within reach Nurse Communication: Mobility status PT Visit Diagnosis: Other abnormalities of gait and mobility (R26.89);Unsteadiness on feet (R26.81);Pain Pain - Right/Left: Right Pain - part of body: Leg    Time: [id number] PT Time Calculation (min) (ACUTE ONLY): 23 min   Charges:   PT Evaluation $PT Eval Moderate Complexity: 1 Procedure PT Treatments $Gait Training: 8-22 mins   PT G Codes:  Gladys Damme, PT, DPT  Acute Rehabilitation Services  Pager: [phone]   Lehman Prom 07/09/2017, 11:54 AM

## 2017-07-09 NOTE — Clinical Social Work Note (Signed)
PT order placed today. Patient has not been evaluated yet but walked 690 feet in cardiac rehab this morning. CSW called the rehab dept and they will send out a page to see if anyone can see him. Patient at this point is not appropriate for SNF due to the distance he is able to ambulate and therefore it is highly unlikely that insurance will pay for it.  Charlynn CourtSarah Sorcha Rotunno, CSW [phone]

## 2017-07-09 NOTE — Clinical Social Work Note (Signed)
CSW facilitated patient discharge including contacting facility to confirm patient discharge plans. Patient declined having CSW call family, stating he would call them himself. Clinical information faxed to facility and family agreeable with plan. CSW arranged ambulance transport via [facility] to [facility] in [place]. RN to call report prior to discharge (Ask for Konrad DoloresKim Hicks: [phone] Room 703) .  CSW will sign off for now as social work intervention is no longer needed. Please consult us again if new needs arise.  Charlynn CourtSarah Evelene Roussin, CSW [phone]

## 2017-07-09 NOTE — Care Management Important Message (Signed)
Important Message  Patient Details  Name: Brendan PrimusLarry Beale MRN: [id number] Date of Birth: Oct 15, 1951   Medicare Important Message Given:  Yes    Dearion Huot Stefan ChurchBratton 07/09/2017, 2:34 PM

## 2017-07-09 NOTE — Progress Notes (Signed)
CARDIAC REHAB PHASE I   PRE:  Rate/Rhythm: 78 SR    BP: sitting 103/64    SaO2: 93 1L  MODE:  Ambulation: 690 ft   POST:  Rate/Rhythm: 90 SR    BP: sitting 107/65     SaO2: 87 RA --> 90 3L  SATURATION QUALIFICATIONS: (This note is used to comply with regulatory documentation for home oxygen)  Patient Saturations on Room Air at Rest = na%  Patient Saturations on Room Air while Ambulating = 87%  Patient Saturations on 3 Liters of oxygen while Ambulating = 90%  Please briefly explain why patient needs home oxygen:  Pt moving well and oxygen needs improved today however still requires O2. After 250 ft SaO2 87 3L. Increased to 4L and pt practiced pursed lip breathing. After 250 ft more SaO2 93 4L. Had pt walk last 70 ft on RA, desat to 87 RA and notable SOB by pt. He feels good when on O2. He plans to d/c to SNF. Pt is practicing IS and inspiring almost 2000 mL with good mechanics. Encouraged continued use and 3-4 walks qd to attempt to get off O2 eventually. Ed completed with good reception. Pt is quitting smoking, has a plan to use nicotine patches and stay away from smokers. Gave resources. Referring to [place] [facility]. Set up d/c video. [phone]    Harriet Massonandi Kristan Demika Langenderfer CES, ACSM 07/09/2017 9:15 AM

## 2017-07-09 NOTE — Care Management Note (Addendum)
Case Management Note Donn PieriniKristi Travante Knee RN, BSN Unit 2W-Case Manager-- 2H coverage [phone]  Patient Details  Name: Brendan PrimusLarry Bevis MRN: [id number] Date of Birth: Jul 27, 1951  Subjective/Objective:  Pt admitted s/p CABGx4 on 07/01/17                  Action/Plan: PTA pt lived at home- CM to follow   Expected Discharge Date:     07/09/17             Expected Discharge Plan:  Skilled Nursing Facility  In-House Referral:  Clinical Social Work  Discharge planning Services  CM Consult  Post Acute Care Choice:  Durable Medical Equipment Choice offered to:     DME Arranged:  Oxygen DME Agency:     HH Arranged:    HH Agency:     Status of Service:  Completed, signed off  If discussed at [employer] Length of Stay Meetings, dates discussed:  7/10  Discharge Disposition: skilled facility   Additional Comments:  07/09/17- 1300- Teresia Myint RN, CM- pt for d/c today- needs SNF- PT eval completed - CSW consulted for SNF placement needs pt will need 02 at SNF CSW aware- plan per CSW to d/c to [facility] SNF today-  Transport to be arranged per CSW  Donn PieriniWebster, Jaking Thayer ScotlandHall, RN 07/09/2017, 1:19 PM

## 2017-07-09 NOTE — Progress Notes (Signed)
Report called to Kim at [facility] in [place].

## 2017-07-09 NOTE — Progress Notes (Signed)
Discharge instructions (including medications) copy provided to patient. Prescription given to [facility]. Patient d/c to [facility] to transport to [facility] in [place].

## 2017-07-09 NOTE — Progress Notes (Signed)
Chest tube sutures removed without difficulty.  Will continue to monitor. 
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
## 2017-07-09 NOTE — Clinical Social Work Note (Addendum)
[facility] unable to take patient today. Second preference is [facility] in [place]. CSW spoke with admissions coordinator. He will review referral and call CSW back. [facility] is third preference. They declined but his daughter works there so will check again if [facility] unable to take him.  Charlynn CourtSarah Ilda Laskin, CSW [phone]  1:12 pm [facility] is able to extend a bed offer to patient for today. MD notified.  Charlynn CourtSarah Jule Whitsel, CSW [phone]

## 2017-07-09 NOTE — Clinical Social Work Placement (Signed)
   CLINICAL SOCIAL WORK PLACEMENT  NOTE  Date:  07/09/2017  Patient Details  Name: Brendan PrimusLarry Mckell MRN: [id number] Date of Birth: 1951-01-05  Clinical Social Work is seeking post-discharge placement for this patient at the Skilled  Nursing Facility level of care (*CSW will initial, date and re-position this form in  chart as items are completed):  Yes   Patient/family provided with [facility] Clinical Social Work Department's list of facilities offering this level of care within the geographic area requested by the patient (or if unable, by the patient's family).  Yes   Patient/family informed of their freedom to choose among providers that offer the needed level of care, that participate in Medicare, Medicaid or managed care program needed by the patient, have an available bed and are willing to accept the patient.  Yes   Patient/family informed of [facility]'s ownership interest in [facility] and [facility], as well as of the fact that they are under no obligation to receive care at these facilities.  PASRR submitted to EDS on       PASRR number received on 07/08/17     Existing PASRR number confirmed on       FL2 transmitted to all facilities in geographic area requested by pt/family on 07/08/17     FL2 transmitted to all facilities within larger geographic area on       Patient informed that his/her managed care company has contracts with or will negotiate with certain facilities, including the following:        Yes   Patient/family informed of bed offers received.  Patient chooses bed at [facility]     Physician recommends and patient chooses bed at      Patient to be transferred to [facility] [place] on 07/09/17.  Patient to be transferred to facility by [facility]     Patient family notified on 07/09/17 of transfer.  Name of family member notified:  Patient stated he would call family     PHYSICIAN Please prepare prescriptions, Please sign FL2      Additional Comment:    _______________________________________________ Margarito LinerSarah C Ahava Kissoon, LCSW 07/09/2017, 1:21 PM

## 2017-07-11 DIAGNOSIS — I2510 Atherosclerotic heart disease of native coronary artery without angina pectoris: Secondary | ICD-10-CM | POA: Diagnosis not present

## 2017-07-11 DIAGNOSIS — J449 Chronic obstructive pulmonary disease, unspecified: Secondary | ICD-10-CM | POA: Diagnosis not present

## 2017-07-11 DIAGNOSIS — M6281 Muscle weakness (generalized): Secondary | ICD-10-CM | POA: Diagnosis not present

## 2017-07-11 DIAGNOSIS — E785 Hyperlipidemia, unspecified: Secondary | ICD-10-CM | POA: Diagnosis not present

## 2017-07-11 DIAGNOSIS — I10 Essential (primary) hypertension: Secondary | ICD-10-CM | POA: Diagnosis not present

## 2017-07-11 DIAGNOSIS — F17211 Nicotine dependence, cigarettes, in remission: Secondary | ICD-10-CM | POA: Diagnosis not present

## 2017-07-11 DIAGNOSIS — I4891 Unspecified atrial fibrillation: Secondary | ICD-10-CM | POA: Diagnosis not present

## 2017-07-11 DIAGNOSIS — Z7984 Long term (current) use of oral hypoglycemic drugs: Secondary | ICD-10-CM | POA: Diagnosis not present

## 2017-07-11 DIAGNOSIS — E119 Type 2 diabetes mellitus without complications: Secondary | ICD-10-CM | POA: Diagnosis not present

## 2017-07-17 DIAGNOSIS — J449 Chronic obstructive pulmonary disease, unspecified: Secondary | ICD-10-CM | POA: Diagnosis not present

## 2017-07-17 DIAGNOSIS — I4891 Unspecified atrial fibrillation: Secondary | ICD-10-CM | POA: Diagnosis not present

## 2017-07-17 DIAGNOSIS — F17211 Nicotine dependence, cigarettes, in remission: Secondary | ICD-10-CM | POA: Diagnosis not present

## 2017-07-17 DIAGNOSIS — E119 Type 2 diabetes mellitus without complications: Secondary | ICD-10-CM | POA: Diagnosis not present

## 2017-07-17 DIAGNOSIS — E785 Hyperlipidemia, unspecified: Secondary | ICD-10-CM | POA: Diagnosis not present

## 2017-07-17 DIAGNOSIS — M6281 Muscle weakness (generalized): Secondary | ICD-10-CM | POA: Diagnosis not present

## 2017-07-17 DIAGNOSIS — I10 Essential (primary) hypertension: Secondary | ICD-10-CM | POA: Diagnosis not present

## 2017-07-17 DIAGNOSIS — I2510 Atherosclerotic heart disease of native coronary artery without angina pectoris: Secondary | ICD-10-CM | POA: Diagnosis not present

## 2017-07-17 DIAGNOSIS — Z7984 Long term (current) use of oral hypoglycemic drugs: Secondary | ICD-10-CM | POA: Diagnosis not present

## 2017-07-30 DIAGNOSIS — F17211 Nicotine dependence, cigarettes, in remission: Secondary | ICD-10-CM | POA: Diagnosis not present

## 2017-07-30 DIAGNOSIS — I2510 Atherosclerotic heart disease of native coronary artery without angina pectoris: Secondary | ICD-10-CM | POA: Diagnosis not present

## 2017-07-30 DIAGNOSIS — J449 Chronic obstructive pulmonary disease, unspecified: Secondary | ICD-10-CM | POA: Diagnosis not present

## 2017-07-30 DIAGNOSIS — M6281 Muscle weakness (generalized): Secondary | ICD-10-CM | POA: Diagnosis not present

## 2017-07-30 DIAGNOSIS — M791 Myalgia: Secondary | ICD-10-CM | POA: Diagnosis not present

## 2017-07-30 DIAGNOSIS — I10 Essential (primary) hypertension: Secondary | ICD-10-CM | POA: Diagnosis not present

## 2017-07-30 DIAGNOSIS — E119 Type 2 diabetes mellitus without complications: Secondary | ICD-10-CM | POA: Diagnosis not present

## 2017-07-30 DIAGNOSIS — E785 Hyperlipidemia, unspecified: Secondary | ICD-10-CM | POA: Diagnosis not present

## 2017-07-30 DIAGNOSIS — D649 Anemia, unspecified: Secondary | ICD-10-CM | POA: Diagnosis not present

## 2017-07-30 DIAGNOSIS — Z7984 Long term (current) use of oral hypoglycemic drugs: Secondary | ICD-10-CM | POA: Diagnosis not present

## 2017-07-30 DIAGNOSIS — I4891 Unspecified atrial fibrillation: Secondary | ICD-10-CM | POA: Diagnosis not present

## 2017-07-31 ENCOUNTER — Encounter: Payer: Self-pay | Admitting: Internal Medicine

## 2017-07-31 ENCOUNTER — Ambulatory Visit (INDEPENDENT_AMBULATORY_CARE_PROVIDER_SITE_OTHER): Payer: Medicare Other | Admitting: Internal Medicine

## 2017-07-31 ENCOUNTER — Telehealth: Payer: Self-pay | Admitting: *Deleted

## 2017-07-31 VITALS — BP 140/90 | HR 78 | Ht 75.0 in | Wt 182.0 lb

## 2017-07-31 DIAGNOSIS — I480 Paroxysmal atrial fibrillation: Secondary | ICD-10-CM

## 2017-07-31 DIAGNOSIS — I2510 Atherosclerotic heart disease of native coronary artery without angina pectoris: Secondary | ICD-10-CM | POA: Diagnosis not present

## 2017-07-31 DIAGNOSIS — E785 Hyperlipidemia, unspecified: Secondary | ICD-10-CM | POA: Diagnosis not present

## 2017-07-31 DIAGNOSIS — R0602 Shortness of breath: Secondary | ICD-10-CM

## 2017-07-31 DIAGNOSIS — I10 Essential (primary) hypertension: Secondary | ICD-10-CM | POA: Diagnosis not present

## 2017-07-31 MED ORDER — FUROSEMIDE 20 MG PO TABS: 20.0000 mg | ORAL_TABLET | Freq: Every day | ORAL | 3 refills | Status: DC

## 2017-07-31 NOTE — Telephone Encounter (Signed)
Patient saw Dr End today and stated he had lab work drawn at [facility] today where he resides. Spoke with GrenadaBrittany, nurse, at [facility] who verbalized understanding to fax lab results to our office once resulted. She also placed order for BMP to be drawn in 1 week on 08/07/17 at the Nursing home with results to be faxed to us.

## 2017-07-31 NOTE — Patient Instructions (Signed)
Medication Instructions:  Your physician has recommended you make the following change in your medication:  1- START Furosemide 20 mg (1 tablet) by mouth once a day.   Labwork: Your physician recommends that you return for lab work in: 1 WEEK (BMP). We will reach out to the nursing home to see if they can draw your lab work there.   Testing/Procedures: none  Follow-Up: Your physician recommends that you schedule a follow-up appointment in: 1 MONTH WITH DR END OR EXTENDER.   If you need a refill on your cardiac medications before your next appointment, please call your pharmacy.

## 2017-07-31 NOTE — Progress Notes (Signed)
Follow-up Outpatient Visit Date: 07/31/2017  Primary Care Provider: Gabriel CirriWicker, Cheryl, NP [address] [place] [id number]  Chief Complaint: Shortness of breath and follow-up coronary artery disease  HPI:  Brendan [name] is a 66 y.o. year-old male with history of coronary artery disease status post CABG, hypertension, hyperlipidemia, diabetes mellitus, and tobacco use, who presents for follow-up of coronary artery disease. I last saw him 05/07/17, which time survive and continued to have exertional angina that persisted despite medical therapy. We subsequently performed cardiac catheterization going three-vessel coronary artery disease. He underwent 4 vessel CABG by Dr. Donata ClayVan Trigt on 07/02/17. Postoperative course was complicated by pneumothorax requiring chest tube placement. He is gradually recovering, though he continues to have shortness of breath and is requiring supplemental oxygen. He is at a rehabilitation facility due to deconditioning. He has some soreness at his median sternotomy site but is otherwise without chest pain. He denies palpitations, lightheadedness, orthopnea, and PND. He has mild ankle edema, which is gradually improving. Brendan [name] noted a little bit of drainage from his right chest tube site last week, which has resolved. He is keeping the site covered with clean dressing.  --------------------------------------------------------------------------------------------------  Cardiovascular History & Procedures: Cardiovascular Problems:  Coronary artery disease status post CABG  Risk Factors:  Hypertension, hyperlipidemia, diabetes mellitus, tobacco use, male gender, age greater than 5355  Cath/PCI:  LHC (06/04/17): LMCA with 20-30% disease. LAD with 50% proximal and 90% mid vessel stenosis. Moderate-caliber D1 with 50% proximal disease. Moderate-Cal 2 with 80% ostial disease. LCx with sequential 90 and 70% ostial stenoses involving moderate-caliber OM 2. Large, dominant RCA with diffuse  proximal and mid vessel disease with sequential 80 and 90% stenoses. PDA with mild diffuse disease. RPL1 with 50% proximal stenosis. LVEF 50-55% with upper normal LVEDP.  CV Surgery:  CABG (07/02/17, Dr. Donata ClayVan Trigt): LIMA to LAD, SVG to D2, SVG to OM, and SVG to PDA.  EP Procedures and Devices:  None  Non-Invasive Evaluation(s):  TTE (06/28/16, [facility]): Normal LV size and function (EF 55%). Grade 1 diastolic dysfunction. Normal right ventricular function. Mild tricuspid and mitral regurgitation. No valvular stenosis.  Pharmacologic myocardial perfusion stress test (06/28/16, [facility]):Homogeneous tracer uptake without ischemia or scar. LVEF 58%. Recent CV Pertinent Labs: Lab Results  Component Value Date   CHOL 110 03/19/2017   CHOL 111 02/29/2016   HDL 36 (L) 03/19/2017   LDLCALC 54 03/19/2017   TRIG 98 03/19/2017   TRIG 73 02/29/2016   INR 1.10 06/18/2017   K 3.7 07/06/2017   MG 2.3 07/02/2017   BUN 15 07/06/2017   BUN 15 05/30/2017   CREATININE 0.83 07/06/2017    Past medical and surgical history were reviewed and updated in EPIC.  Current Meds  Medication Sig  . acetaminophen (TYLENOL) 500 MG tablet Take 1 tablet (500 mg total) by mouth every 6 (six) hours as needed for mild pain or headache.  . albuterol (PROVENTIL HFA;VENTOLIN HFA) 108 (90 Base) MCG/ACT inhaler Inhale 2 puffs into the lungs every 6 (six) hours as needed for wheezing or shortness of breath.  [name]. amiodarone (PACERONE) 200 MG tablet Take 1 tablet (200 mg total) by mouth daily.  [name]. aspirin EC 325 MG EC tablet Take 1 tablet (325 mg total) by mouth daily.  [name]. atorvastatin (LIPITOR) 40 MG tablet Take 1 tablet (40 mg total) by mouth daily. (Patient taking differently: Take 20 mg by mouth at bedtime. )  . azelastine (ASTELIN) 0.1 % nasal spray Place 2 sprays  into both nostrils at bedtime.   [name] guaiFENesin (MUCINEX) 600 MG 12 hr tablet Take 1 tablet (600 mg total) by mouth 2 (two) times daily as needed  for cough or to loosen phlegm.  . metFORMIN (GLUCOPHAGE) 500 MG tablet Take 1 tablet (500 mg total) by mouth 2 (two) times daily with a meal.  . metoprolol tartrate (LOPRESSOR) 25 MG tablet Take 0.5 tablets (12.5 mg total) by mouth 2 (two) times daily.  . montelukast (SINGULAIR) 10 MG tablet Take 10 mg by mouth at bedtime.   Melene Muller ON 08/09/2017] Multiple Vitamin (MULTIVITAMIN WITH MINERALS) TABS tablet Take 1 tablet by mouth daily. ONE-A-DAY  . nicotine (NICODERM CQ - DOSED IN MG/24 HOURS) 21 mg/24hr patch Place 1 patch (21 mg total) onto the skin daily.  [name] oxyCODONE (OXY IR/ROXICODONE) 5 MG immediate release tablet Take 5 mg by mouth every 4-6 hours PRN severe pain.  . tamsulosin (FLOMAX) 0.4 MG CAPS capsule Take 1 capsule (0.4 mg total) by mouth daily. (Patient taking differently: Take 0.4 mg by mouth daily after supper. )    Allergies: No known allergies  Social History   Social History  . Marital status: Divorced    Spouse name: N/A  . Number of children: N/A  . Years of education: N/A   Occupational History  . Not on file.   Social History Main Topics  . Smoking status: Former Smoker    Packs/day: 1.00    Years: 60.00    Types: Cigarettes    Quit date: 07/01/2017  . Smokeless tobacco: Never Used  . Alcohol use No     Comment: previously drank heavily but quit ~ 20 yrs ago.  . Drug use: No  . Sexual activity: Yes   Other Topics Concern  . Not on file   Social History Narrative   Lives in [place] by himself.  Works as Copy.  Does not routinely exercise.    Family History  Problem Relation Age of Onset  . Hypertension Mother   . Diabetes Mother   . Heart disease Father 18       multiple MI's    Review of Systems: A 12-system review of systems was performed and was negative except as noted in the HPI.  --------------------------------------------------------------------------------------------------  Physical Exam: BP 140/90 (BP Location: Left Arm, Patient  Position: Sitting, Cuff Size: Normal)   Pulse 78   Ht 6\' 3"  (1.905 m)   Wt 182 lb (82.6 kg)   BMI 22.75 kg/m   General:  Thin man seated comfortably in the exam room. He is wearing supplemental oxygen. HEENT: No conjunctival pallor or scleral icteru mild conjunctival pallor noted without scleral icterus s. Moist mucous membranes.  OP clear. Neck: Supple without lymphadenopathy, thyromegaly, JVD, or HJR. No carotid bruit. Lungs: Normal work of breathing. Mildly diminished breath sounds at both bases. No wheezes or crackles. Heart: Regular rate and rhythm without murmurs, rubs, or gallops. Non-displaced PMI. Median sternotomy is healing well. Bandage overlies right chest tube site. Abd: Bowel sounds present. Soft, NT/ND without hepatosplenomegaly Ext: 1+ pretibial edema. Radial, PT, and DP pulses are 2+ bilaterally. Skin: Warm and dry without rash.  EKG:  Normal sinus rhythm without abnormalities.  Lab Results  Component Value Date   WBC 13.3 (H) 07/06/2017   HGB 8.1 (L) 07/06/2017   HCT 24.2 (L) 07/06/2017   MCV 88.6 07/06/2017   PLT 180 07/06/2017    Lab Results  Component Value Date   NA 141 07/06/2017  K 3.7 07/06/2017   CL 110 07/06/2017   CO2 24 07/06/2017   BUN 15 07/06/2017   CREATININE 0.83 07/06/2017   GLUCOSE 124 (H) 07/06/2017   ALT 26 07/05/2017    Lab Results  Component Value Date   CHOL 110 03/19/2017   HDL 36 (L) 03/19/2017   LDLCALC 54 03/19/2017   TRIG 98 03/19/2017    --------------------------------------------------------------------------------------------------  ASSESSMENT AND PLAN: Coronary artery disease without angina Cervix is gradually recovering from CABG approximately one month ago. He has not had any more angina but continues to have some shortness of breath and hypoxia. This is likely multifactorial, including underlying lung disease with a history of smoking up until the time of CABG. We will continue his current medications for  secondary prevention including aspirin, atorvastatin, and metoprolol. Brendan [name] is scheduled to follow-up with Dr. Donata ClayVan Trigt next week.  Shortness of breath Work of breathing is normal although decreased breath sounds noted at both bases. Brendan [name] also has mild ankle edema. We have agreed to start furosemide 20 mg daily. Brendan [name] reports that labs were drawn earlier today at his rehabilitation facility. We will request these records. He should have a basic metabolic panel done in about a week to ensure stable renal function and potassium with addition of furosemide. Oxygen should be weaned as tolerated.  Perioperative atrial fibrillation Atrial fibrillation with rapid ventricular response was noted at the Margree Gimbel of CABG. It does not appear that he had any further episodes while in the hospital. He has been without palpitations and is in sinus rhythm today. I will continue amiodarone 200 mg daily pending further evaluation by Dr. Donata ClayVan Trigt. Unless there is evidence of recurrent atrial fibrillation, I would favor discontinuing amiodarone by 3 months postop.  Hypertension Blood pressure is mildly elevated today. We will add furosemide, as above. No other medication changes at this time.  Hyperlipidemia Brendan [name] is tolerating high intensity statin therapy well. We will continue his current dose of atorvastatin.  Follow-up: Return to clinic in 1 month.  Yvonne Kendallhristopher Ermalinda Joubert, MD 07/31/2017 10:44 AM

## 2017-08-01 NOTE — Telephone Encounter (Signed)
Labs received and scanned into Dr Serita KyleEnd's results basket to review.

## 2017-08-01 NOTE — Telephone Encounter (Signed)
S/w GrenadaBrittany. She said she faxed results yesterday. I could not locate them at this time. She said she will re-fax the labs now. I was appreciative.

## 2017-08-02 ENCOUNTER — Encounter: Payer: Self-pay | Admitting: Internal Medicine

## 2017-08-02 DIAGNOSIS — I480 Paroxysmal atrial fibrillation: Secondary | ICD-10-CM | POA: Insufficient documentation

## 2017-08-05 ENCOUNTER — Telehealth: Payer: Self-pay | Admitting: Unknown Physician Specialty

## 2017-08-05 NOTE — Telephone Encounter (Signed)
Called pt to schedule for Annual Wellness Visit with Nurse Health Advisor, Tiffany Hill, my c/b # is [phone]  Kathryn Brown ° °

## 2017-08-06 ENCOUNTER — Other Ambulatory Visit: Payer: Self-pay | Admitting: Cardiothoracic Surgery

## 2017-08-06 DIAGNOSIS — Z951 Presence of aortocoronary bypass graft: Secondary | ICD-10-CM

## 2017-08-07 ENCOUNTER — Ambulatory Visit (INDEPENDENT_AMBULATORY_CARE_PROVIDER_SITE_OTHER): Payer: Self-pay | Admitting: Cardiothoracic Surgery

## 2017-08-07 ENCOUNTER — Encounter: Payer: Self-pay | Admitting: Cardiothoracic Surgery

## 2017-08-07 ENCOUNTER — Ambulatory Visit
Admission: RE | Admit: 2017-08-07 | Discharge: 2017-08-07 | Disposition: A | Discharge status: Home / Self Care | Payer: Medicare Other | Source: Ambulatory Visit | Attending: Cardiothoracic Surgery | Admitting: Cardiothoracic Surgery

## 2017-08-07 VITALS — BP 168/100 | HR 75 | Resp 16 | Ht 75.0 in | Wt 182.0 lb

## 2017-08-07 DIAGNOSIS — I200 Unstable angina: Secondary | ICD-10-CM

## 2017-08-07 DIAGNOSIS — Z951 Presence of aortocoronary bypass graft: Secondary | ICD-10-CM

## 2017-08-07 DIAGNOSIS — R0602 Shortness of breath: Secondary | ICD-10-CM | POA: Diagnosis not present

## 2017-08-07 DIAGNOSIS — J90 Pleural effusion, not elsewhere classified: Secondary | ICD-10-CM | POA: Diagnosis not present

## 2017-08-07 DIAGNOSIS — E1151 Type 2 diabetes mellitus with diabetic peripheral angiopathy without gangrene: Secondary | ICD-10-CM

## 2017-08-07 NOTE — Progress Notes (Signed)
PCP is Gabriel CirriWicker, Cheryl, NP Referring Provider is End, Cristal Deerhristopher, MD  Chief Complaint  Patient presents with  . Routine Post Op    s/p CABG X 4...07/01/17...with a CXR;;[facility] [place]    HPI: The patient returns for scheduled postop follow-up after urgent CABG 4. He is at a  skilled nursing facility because he lives alone and he is on oxygen. He has no symptoms of recurrent angina. He was seen by his cardiology who restarted a course of Lasix for some swelling in his right ankle. He is walking up to 6 minutes at a time. His incisions are healing well.  Today's chest x-ray shows a moderate-large left pleural effusion. After informed consent a left thoracentesis was performed in the office which removed 2.2 L of serosanguineous thin fluid. Breath sounds improved after the thoracentesis which s he tolerated well.  Past Medical History:  Diagnosis Date  . Chest pain    a. 05/2016 Lexi MV: EF 58%, no ischemia/infarct.  . Coronary artery disease   . Diabetes mellitus without complication (HCC)   . Diastolic dysfunction    a. 05/2016 Echo: EF 55%, Gr1 DD, mild TR/MR.  [name]. Dyspnea   . Hyperlipidemia   . Hypertension   . Personal history of tobacco use, presenting hazards to health 05/03/2016    Past Surgical History:  Procedure Laterality Date  . BACK SURGERY    . CORONARY ARTERY BYPASS GRAFT N/A 07/01/2017   Procedure: CORONARY ARTERY BYPASS GRAFTING (CABG) x4, using left mammary artery and right greater saphenous vein harvested endoscopically;  Surgeon: Kerin PernaVan Trigt, Kiriana Worthington, MD;  [facility];  Service: Open Heart Surgery;  Laterality: N/A;  . KNEE SURGERY    . LEFT HEART CATH AND CORONARY ANGIOGRAPHY N/A 06/04/2017   Procedure: Left Heart Cath and Coronary Angiography;  Surgeon: Yvonne KendallEnd, Christopher, MD;  Location: [facility] INVASIVE CV LAB;  Service: Cardiovascular;  Laterality: N/A;  . TEE WITHOUT CARDIOVERSION N/A 07/01/2017   Procedure: TRANSESOPHAGEAL ECHOCARDIOGRAM (TEE);   Surgeon: Donata ClayVan Trigt, Theron AristaPeter, MD;  [facility];  Service: Open Heart Surgery;  Laterality: N/A;    Family History  Problem Relation Age of Onset  . Hypertension Mother   . Diabetes Mother   . Heart disease Father [id number]       multiple MI's    Social History Social History  Substance Use Topics  . Smoking status: Former Smoker    Packs/day: 1.00    Years: 60.00    Types: Cigarettes    Quit date: 07/01/2017  . Smokeless tobacco: Never Used  . Alcohol use No     Comment: previously drank heavily but quit ~ 20 yrs ago.    Current Outpatient Prescriptions  Medication Sig Dispense Refill  . acetaminophen (TYLENOL) 500 MG tablet Take 1 tablet (500 mg total) by mouth every 6 (six) hours as needed for mild pain or headache. 30 tablet 0  . albuterol (PROVENTIL HFA;VENTOLIN HFA) 108 (90 Base) MCG/ACT inhaler Inhale 2 puffs into the lungs every 6 (six) hours as needed for wheezing or shortness of breath. 1 Inhaler 2  . amiodarone (PACERONE) 200 MG tablet Take 1 tablet (200 mg total) by mouth daily.    [name]. aspirin EC 325 MG EC tablet Take 1 tablet (325 mg total) by mouth daily. 30 tablet 0  . atorvastatin (LIPITOR) 40 MG tablet Take 1 tablet (40 mg total) by mouth daily. (Patient taking differently: Take 40 mg by mouth at bedtime. ) 90 tablet 0  .  azelastine (ASTELIN) 0.1 % nasal spray Place 2 sprays into both nostrils at bedtime.     . furosemide (LASIX) 20 MG tablet Take 1 tablet (20 mg total) by mouth daily. 90 tablet 3  . guaiFENesin (MUCINEX) 600 MG 12 hr tablet Take 1 tablet (600 mg total) by mouth 2 (two) times daily as needed for cough or to loosen phlegm.    . metFORMIN (GLUCOPHAGE) 500 MG tablet Take 1 tablet (500 mg total) by mouth 2 (two) times daily with a meal. 180 tablet 0  . metoprolol tartrate (LOPRESSOR) 25 MG tablet Take 0.5 tablets (12.5 mg total) by mouth 2 (two) times daily.    . montelukast (SINGULAIR) 10 MG tablet Take 10 mg by mouth at bedtime.     Melene Muller ON 08/09/2017]  Multiple Vitamin (MULTIVITAMIN WITH MINERALS) TABS tablet Take 1 tablet by mouth daily. ONE-A-DAY    . nicotine (NICODERM CQ - DOSED IN MG/24 HOURS) 21 mg/24hr patch Place 1 patch (21 mg total) onto the skin daily. 28 patch 0  . oxyCODONE (OXY IR/ROXICODONE) 5 MG immediate release tablet Take 5 mg by mouth every 4-6 hours PRN severe pain. 30 tablet 0  . tamsulosin (FLOMAX) 0.4 MG CAPS capsule Take 1 capsule (0.4 mg total) by mouth daily. (Patient taking differently: Take 0.4 mg by mouth daily after supper. ) 90 capsule 3   No current facility-administered medications for this visit.     Allergies  Allergen Reactions  . No Known Allergies     Review of Systems  He has lost 5 pounds since surgery He's had a dry cough His sleeping and overall strength have improved  BP (!) 168/100 (BP Location: Left Arm, Patient Position: Sitting, Cuff Size: Normal)   Pulse 75   Resp 16   Ht 6\' 3"  (1.905 m)   Wt 182 lb (82.6 kg)   SpO2 98% Comment: ON 1L O2  BMI 22.75 kg/m  Physical Exam      Exam    General- alert and comfortable   Lungs- clear without rales, wheezes   Cor- regular rate and rhythm, no murmur , gallop   Abdomen- soft, non-tender   Extremities - warm, non-tender, minimal edema   Neuro- oriented, appropriate, no focal weakness  Diagnostic Tests: Chest x-ray shows a moderate-large left pleural effusion Sternal wires are aligned and intact  Impression: Patient should be able to transfer to home in 5 days. Patient should be able to be weaned off his 1 L oxygen nasal cannula prior to his discharge home. Successful removal of postoperative left pleural effusion from chest wall inflammation.  Plan: Return with chest x-ray to follow up left pleural effusion in 3 weeks Continue current medications. He is not using any pain medicine. Mikey Bussing, MD [facility] [phone]

## 2017-08-08 ENCOUNTER — Ambulatory Visit: Payer: Medicare Other | Admitting: Nurse Practitioner

## 2017-08-13 DIAGNOSIS — Z48812 Encounter for surgical aftercare following surgery on the circulatory system: Secondary | ICD-10-CM | POA: Diagnosis not present

## 2017-08-13 DIAGNOSIS — I2510 Atherosclerotic heart disease of native coronary artery without angina pectoris: Secondary | ICD-10-CM | POA: Diagnosis not present

## 2017-08-13 NOTE — Telephone Encounter (Signed)
Brendan CumminsJesse with [facility] called stating patient was started today with homehealth and OT/PT request would be forthcoming.  He also needs clarification on one of the medications furosemide 20 mg or 40 mg.  At the present he is taking 20mg  with no retention.  Brendan [name]--[phone]  Thank You

## 2017-08-13 NOTE — Telephone Encounter (Signed)
Called and spoke to [name] after looking through the patient's chart. According to cardiology note, patient is to be taking 20 mg of lasix. Called and let [name] know this information.

## 2017-08-14 DIAGNOSIS — Z48812 Encounter for surgical aftercare following surgery on the circulatory system: Secondary | ICD-10-CM | POA: Diagnosis not present

## 2017-08-14 DIAGNOSIS — I2510 Atherosclerotic heart disease of native coronary artery without angina pectoris: Secondary | ICD-10-CM | POA: Diagnosis not present

## 2017-08-15 DIAGNOSIS — Z48812 Encounter for surgical aftercare following surgery on the circulatory system: Secondary | ICD-10-CM | POA: Diagnosis not present

## 2017-08-15 DIAGNOSIS — I2510 Atherosclerotic heart disease of native coronary artery without angina pectoris: Secondary | ICD-10-CM | POA: Diagnosis not present

## 2017-08-19 ENCOUNTER — Telehealth: Payer: Self-pay | Admitting: Unknown Physician Specialty

## 2017-08-19 NOTE — Telephone Encounter (Signed)
Routing to provider  

## 2017-08-19 NOTE — Telephone Encounter (Signed)
Orders OK.  Thank you.  What does he desaturate to when walking?

## 2017-08-19 NOTE — Telephone Encounter (Signed)
Called and let [name] know that [name] said that the orders were OK. I asked about the patient's oxygen desaturating and [name] stated that he was walking around with the patient and his oxygen went down to 90 and briefly to 88. He states that he really encouraged the patient to wear his oxygen and to not over due things. He states that the patient's resting oxygen levels are around 93-94.

## 2017-08-19 NOTE — Telephone Encounter (Signed)
Lemuel with [name] called to get verbal orders for patient to have PT once weekly for 4 weeks.  He is needing help with deep breathing exercises and also needs oxygen when walking so that he does not desaturate.  He was started on this last week 8/15.  Thanks  [place] [phone]

## 2017-08-20 ENCOUNTER — Ambulatory Visit (INDEPENDENT_AMBULATORY_CARE_PROVIDER_SITE_OTHER): Payer: Medicare Other | Admitting: Unknown Physician Specialty

## 2017-08-20 ENCOUNTER — Telehealth: Payer: Self-pay | Admitting: Unknown Physician Specialty

## 2017-08-20 ENCOUNTER — Encounter: Payer: Self-pay | Admitting: Unknown Physician Specialty

## 2017-08-20 VITALS — BP 146/89 | HR 58 | Temp 98.4°F | Wt 177.2 lb

## 2017-08-20 DIAGNOSIS — E119 Type 2 diabetes mellitus without complications: Secondary | ICD-10-CM

## 2017-08-20 DIAGNOSIS — Z48812 Encounter for surgical aftercare following surgery on the circulatory system: Secondary | ICD-10-CM | POA: Diagnosis not present

## 2017-08-20 DIAGNOSIS — J90 Pleural effusion, not elsewhere classified: Secondary | ICD-10-CM | POA: Diagnosis not present

## 2017-08-20 DIAGNOSIS — D649 Anemia, unspecified: Secondary | ICD-10-CM | POA: Diagnosis not present

## 2017-08-20 DIAGNOSIS — E785 Hyperlipidemia, unspecified: Secondary | ICD-10-CM | POA: Diagnosis not present

## 2017-08-20 DIAGNOSIS — I200 Unstable angina: Secondary | ICD-10-CM

## 2017-08-20 DIAGNOSIS — F17200 Nicotine dependence, unspecified, uncomplicated: Secondary | ICD-10-CM

## 2017-08-20 DIAGNOSIS — Z951 Presence of aortocoronary bypass graft: Secondary | ICD-10-CM

## 2017-08-20 DIAGNOSIS — I10 Essential (primary) hypertension: Secondary | ICD-10-CM | POA: Diagnosis not present

## 2017-08-20 DIAGNOSIS — I2510 Atherosclerotic heart disease of native coronary artery without angina pectoris: Secondary | ICD-10-CM | POA: Diagnosis not present

## 2017-08-20 LAB — CBC WITH DIFFERENTIAL/PLATELET
Hematocrit: 44 % (ref 37.5–51.0)
Hemoglobin: 13.4 g/dL (ref 13.0–17.7)
Lymphocytes Absolute: 2.1 x10E3/uL (ref 0.7–3.1)
Lymphs: 26 %
MCH: 26.9 pg (ref 26.6–33.0)
MCHC: 30.5 g/dL — ABNORMAL LOW (ref 31.5–35.7)
MCV: 88 fL (ref 79–97)
MID (Absolute): 0.8 X10E3/uL (ref 0.1–1.6)
MID: 10 %
Neutrophils Absolute: 5 x10E3/uL (ref 1.4–7.0)
Neutrophils: 64 %
Platelets: 268 x10E3/uL (ref 150–379)
RBC: 4.98 x10E6/uL (ref 4.14–5.80)
RDW: 17.5 % — ABNORMAL HIGH (ref 12.3–15.4)
WBC: 7.9 x10E3/uL (ref 3.4–10.8)

## 2017-08-20 LAB — BAYER DCA HB A1C WAIVED: HB A1C (BAYER DCA - WAIVED): 5.1 % (ref <7.0)

## 2017-08-20 MED ORDER — MONTELUKAST SODIUM 10 MG PO TABS: 10.0000 mg | ORAL_TABLET | Freq: Every day | ORAL | 1 refills | Status: DC

## 2017-08-20 NOTE — Telephone Encounter (Signed)
Brendan [name] with [name] called to make the provider aware that aspirin and iron are still not in the patients home. He has advised him to obtain the as they are OTC meds.  He informed Brendan [name] that he will try to obtain these today.  Just FYI

## 2017-08-20 NOTE — Assessment & Plan Note (Signed)
H/H is 13.4/44.0.  Resolved.  No need to take iron

## 2017-08-20 NOTE — Assessment & Plan Note (Signed)
Hgb A1 C is 5.1%.  Stop Metformin

## 2017-08-20 NOTE — Assessment & Plan Note (Signed)
High today but has been good

## 2017-08-20 NOTE — Patient Instructions (Signed)
Stop Metformin No need to take iron

## 2017-08-20 NOTE — Assessment & Plan Note (Addendum)
Following with Dr. Morton Peters.  Seem to be resolved

## 2017-08-20 NOTE — Progress Notes (Signed)
BP (!) 146/89 (BP Location: Left Arm, Cuff Size: Normal)   Pulse (!) 58   Temp 98.4 F (36.9 C)   Wt 177 lb 3.2 oz (80.4 kg)   SpO2 93%   BMI 22.15 kg/m    Subjective:    Patient ID: Brendan [name], male    DOB: 09-18-1951, 66 y.o.   MRN: [id number]  HPI: Brendan [name] is a 66 y.o. male  Chief Complaint  Patient presents with  . Hospitalization Follow-up    pt in hospital for 8 days, discharged to rehab for 4 weeks    Pt with a CABG x4 July  2nd 2018 following cardiac cath due to angina symptoms.  He went to rehab for 4 weeks.  Case complicated by   bilateral pleural effusions and anemia.    Was hypoxic and saw Dr. Morton Peters for thoracentesis and oxygen has since improved.   Pt states his breathing has been good since that time.    Anemia Was taking iron.  Currently no supplementation but planning on getting some.  Transfused in hospital.  No f/u yet done  Relevant past medical, surgical, family and social history reviewed and updated as indicated. Interim medical history since our last visit reviewed. Allergies and medications reviewed and updated.  Review of Systems  Per HPI unless specifically indicated above     Objective:    BP (!) 146/89 (BP Location: Left Arm, Cuff Size: Normal)   Pulse (!) 58   Temp 98.4 F (36.9 C)   Wt 177 lb 3.2 oz (80.4 kg)   SpO2 93%   BMI 22.15 kg/m   Wt Readings from Last 3 Encounters:  08/20/17 177 lb 3.2 oz (80.4 kg)  08/07/17 182 lb (82.6 kg)  07/31/17 182 lb (82.6 kg)    Physical Exam  Constitutional: He is oriented to person, place, and time. He appears well-developed and well-nourished. No distress.  HENT:  Head: Normocephalic and atraumatic.  Eyes: Conjunctivae and lids are normal. Right eye exhibits no discharge. Left eye exhibits no discharge. No scleral icterus.  Neck: Normal range of motion. Neck supple. No JVD present. Carotid bruit is not present.  Cardiovascular: Normal rate, regular rhythm and normal heart sounds.    Pulmonary/Chest: Effort normal and breath sounds normal. No respiratory distress.  Abdominal: Normal appearance. There is no splenomegaly or hepatomegaly.  Musculoskeletal: Normal range of motion.  Neurological: He is alert and oriented to person, place, and time.  Skin: Skin is warm, dry and intact. No rash noted. No pallor.  Psychiatric: He has a normal mood and affect. His behavior is normal. Judgment and thought content normal.     Assessment & Plan:   Problem List Items Addressed This Visit      Unprioritized   Anemia    H/H is 13.4/44.0.  Resolved.  No need to take iron      Relevant Orders   CBC With Differential/Platelet   Comprehensive metabolic panel   Anemia panel   Diabetes mellitus type 2, controlled, without complications (HCC)    Hgb A1 C is 5.1%.  Stop Metformin      Relevant Orders   Bayer DCA Hb A1c Waived   Hyperlipidemia LDL goal <70   Relevant Orders   Lipid Panel w/o Chol/HDL Ratio   Hypertension - Primary    High today but has been good      Pleural effusion    Following with Dr. Morton Peters.  Seem to be resolved  S/P CABG x 4    Reporting no discomfort.  Healing well      RESOLVED: Tobacco use disorder    Quit smoking          Follow up plan: Return in about 3 months (around 11/20/2017).

## 2017-08-20 NOTE — Telephone Encounter (Signed)
Routing to provider, FYI.  

## 2017-08-20 NOTE — Assessment & Plan Note (Signed)
Quit smoking. 

## 2017-08-20 NOTE — Assessment & Plan Note (Addendum)
Reporting no discomfort.  Healing well

## 2017-08-21 LAB — SPECIMEN STATUS

## 2017-08-21 LAB — COMPREHENSIVE METABOLIC PANEL
A/G RATIO: 2.4 — AB (ref 1.2–2.2)
ALK PHOS: 96 IU/L (ref 39–117)
ALT: 11 IU/L (ref 0–44)
AST: 10 IU/L (ref 0–40)
Albumin: 4.4 g/dL (ref 3.6–4.8)
BUN/Creatinine Ratio: 12 (ref 10–24)
BUN: 13 mg/dL (ref 8–27)
Bilirubin Total: <0.2 mg/dL (ref 0.0–1.2)
CALCIUM: 9.5 mg/dL (ref 8.6–10.2)
CO2: 25 mmol/L (ref 20–29)
Chloride: 104 mmol/L (ref 96–106)
Creatinine, Ser: 1.09 mg/dL (ref 0.76–1.27)
GFR calc Af Amer: 81 mL/min/{1.73_m2} (ref >59)
GFR, EST NON AFRICAN AMERICAN: 70 mL/min/{1.73_m2} (ref >59)
GLOBULIN, TOTAL: 1.8 g/dL (ref 1.5–4.5)
Glucose: 93 mg/dL (ref 65–99)
POTASSIUM: 4.4 mmol/L (ref 3.5–5.2)
SODIUM: 145 mmol/L — AB (ref 134–144)
Total Protein: 6.2 g/dL (ref 6.0–8.5)

## 2017-08-21 LAB — LIPID PANEL W/O CHOL/HDL RATIO
Cholesterol, Total: 112 mg/dL (ref 100–199)
HDL: 47 mg/dL (ref >39)
LDL Calculated: 50 mg/dL (ref 0–99)
TRIGLYCERIDES: 75 mg/dL (ref 0–149)
VLDL Cholesterol Cal: 15 mg/dL (ref 5–40)

## 2017-08-22 ENCOUNTER — Telehealth: Payer: Self-pay | Admitting: Unknown Physician Specialty

## 2017-08-22 DIAGNOSIS — I2510 Atherosclerotic heart disease of native coronary artery without angina pectoris: Secondary | ICD-10-CM | POA: Diagnosis not present

## 2017-08-22 DIAGNOSIS — Z48812 Encounter for surgical aftercare following surgery on the circulatory system: Secondary | ICD-10-CM | POA: Diagnosis not present

## 2017-08-22 LAB — ANEMIA PANEL
FOLATE, RBC: 1330 ng/mL (ref >498)
Folate, Hemolysate: 560.1 ng/mL
HEMATOCRIT: 42.1 % (ref 37.5–51.0)

## 2017-08-22 NOTE — Telephone Encounter (Signed)
Lemuel from [facility] called to get clarification on the list of meds that patient has from here. He states that the patient had potassium choloride 10 meq 1 tab daily at home which was started at [facility] but it is not on the list from here. He needs to know if he needs to continue on the potassium or stop it.  Thank you  [name] [phone]

## 2017-08-22 NOTE — Telephone Encounter (Signed)
Routing to provider. Potassium is not in current med list. Should patient be on it or not?

## 2017-08-23 NOTE — Telephone Encounter (Signed)
Called and let [name] know that [name] said patient could stop his medication. [name] also wanted [place] to know that the patient is doing pretty good. He states that the patient is not desaturating with exercise so he may be discharging him home soon.

## 2017-08-23 NOTE — Telephone Encounter (Signed)
He can stop.

## 2017-08-24 LAB — IRON AND TIBC
IRON SATURATION: 15 % (ref 15–55)
Iron: 43 ug/dL (ref 38–169)
TIBC: 287 ug/dL (ref 250–450)
UIBC: 244 ug/dL (ref 111–343)

## 2017-08-24 LAB — SPECIMEN STATUS REPORT

## 2017-08-24 LAB — VITAMIN B12: VITAMIN B 12: 1015 pg/mL (ref 232–1245)

## 2017-08-24 LAB — RETICULOCYTES: RETIC CT PCT: 1 % (ref 0.6–2.6)

## 2017-08-24 LAB — FERRITIN: Ferritin: 129 ng/mL (ref 30–400)

## 2017-08-24 LAB — HEMATOCRIT: Hematocrit: 42.1 % (ref 37.5–51.0)

## 2017-08-27 ENCOUNTER — Other Ambulatory Visit: Payer: Self-pay | Admitting: Cardiothoracic Surgery

## 2017-08-27 DIAGNOSIS — I2510 Atherosclerotic heart disease of native coronary artery without angina pectoris: Secondary | ICD-10-CM | POA: Diagnosis not present

## 2017-08-27 DIAGNOSIS — Z48812 Encounter for surgical aftercare following surgery on the circulatory system: Secondary | ICD-10-CM | POA: Diagnosis not present

## 2017-08-27 DIAGNOSIS — I25119 Atherosclerotic heart disease of native coronary artery with unspecified angina pectoris: Secondary | ICD-10-CM

## 2017-08-28 ENCOUNTER — Ambulatory Visit (INDEPENDENT_AMBULATORY_CARE_PROVIDER_SITE_OTHER): Payer: Self-pay | Admitting: Cardiothoracic Surgery

## 2017-08-28 ENCOUNTER — Ambulatory Visit
Admission: RE | Admit: 2017-08-28 | Discharge: 2017-08-28 | Disposition: A | Discharge status: Home / Self Care | Payer: Medicare Other | Source: Ambulatory Visit | Attending: Cardiothoracic Surgery | Admitting: Cardiothoracic Surgery

## 2017-08-28 VITALS — BP 160/93 | HR 65 | Resp 20 | Ht 75.0 in | Wt 178.0 lb

## 2017-08-28 DIAGNOSIS — I2510 Atherosclerotic heart disease of native coronary artery without angina pectoris: Secondary | ICD-10-CM | POA: Diagnosis not present

## 2017-08-28 DIAGNOSIS — J90 Pleural effusion, not elsewhere classified: Secondary | ICD-10-CM

## 2017-08-28 DIAGNOSIS — Z951 Presence of aortocoronary bypass graft: Secondary | ICD-10-CM

## 2017-08-28 DIAGNOSIS — Z48812 Encounter for surgical aftercare following surgery on the circulatory system: Secondary | ICD-10-CM | POA: Diagnosis not present

## 2017-08-28 DIAGNOSIS — I25119 Atherosclerotic heart disease of native coronary artery with unspecified angina pectoris: Secondary | ICD-10-CM

## 2017-08-28 NOTE — Progress Notes (Signed)
PCP is Gabriel CirriWicker, Cheryl, NP Referring Provider is End, Cristal Deerhristopher, MD  Chief Complaint  Patient presents with  . Pleural Effusion    3 week f/u with CXR  . Routine Post Op    s/p CABG    HPI: Scheduled follow-up postop visit with chest x-ray. Patient had a 1.2 liter left pleural effusion drained in the office 3 weeks ago. He has been on Lasix 20 mg daily. He is in sinus rhythm. Overall he feels improved. He is ready to start driving and do some light yard work-20 pound limit lifting Chest x-ray today shows resolution of left pleural effusion The patient's blood pressure is high and he'll be started on lisinopril 10 mg a day He will return in 4 weeks for follow-up chest x-ray and review of progress. He should remain on Lasix 20 mg daily. The patient is not smoking. Past Medical History:  Diagnosis Date  . Chest pain    a. 05/2016 Lexi MV: EF 58%, no ischemia/infarct.  . Coronary artery disease   . Diabetes mellitus without complication (HCC)   . Diastolic dysfunction    a. 05/2016 Echo: EF 55%, Gr1 DD, mild TR/MR.  [name]. Dyspnea   . Hyperlipidemia   . Hypertension   . Personal history of tobacco use, presenting hazards to health 05/03/2016    Past Surgical History:  Procedure Laterality Date  . BACK SURGERY    . CORONARY ARTERY BYPASS GRAFT N/A 07/01/2017   Procedure: CORONARY ARTERY BYPASS GRAFTING (CABG) x4, using left mammary artery and right greater saphenous vein harvested endoscopically;  Surgeon: Kerin PernaVan Trigt, Peter, MD;  [facility];  Service: Open Heart Surgery;  Laterality: N/A;  . KNEE SURGERY    . LEFT HEART CATH AND CORONARY ANGIOGRAPHY N/A 06/04/2017   Procedure: Left Heart Cath and Coronary Angiography;  Surgeon: Yvonne KendallEnd, Christopher, MD;  Location: [facility] INVASIVE CV LAB;  Service: Cardiovascular;  Laterality: N/A;  . TEE WITHOUT CARDIOVERSION N/A 07/01/2017   Procedure: TRANSESOPHAGEAL ECHOCARDIOGRAM (TEE);  Surgeon: Donata ClayVan Trigt, Theron AristaPeter, MD;  [facility];  Service: Open Heart  Surgery;  Laterality: N/A;    Family History  Problem Relation Age of Onset  . Hypertension Mother   . Diabetes Mother   . Heart disease Father [id number]       multiple MI's    Social History Social History  Substance Use Topics  . Smoking status: Former Smoker    Packs/day: 1.00    Years: 60.00    Types: Cigarettes    Quit date: 06/29/2017  . Smokeless tobacco: Never Used  . Alcohol use No     Comment: previously drank heavily but quit ~ 20 yrs ago.    Current Outpatient Prescriptions  Medication Sig Dispense Refill  . acetaminophen (TYLENOL) 500 MG tablet Take 1 tablet (500 mg total) by mouth every 6 (six) hours as needed for mild pain or headache. 30 tablet 0  . albuterol (PROVENTIL HFA;VENTOLIN HFA) 108 (90 Base) MCG/ACT inhaler Inhale 2 puffs into the lungs every 6 (six) hours as needed for wheezing or shortness of breath. 1 Inhaler 2  . amiodarone (PACERONE) 200 MG tablet Take 1 tablet (200 mg total) by mouth daily.    [name]. aspirin EC 325 MG EC tablet Take 1 tablet (325 mg total) by mouth daily. 30 tablet 0  . atorvastatin (LIPITOR) 40 MG tablet Take 1 tablet (40 mg total) by mouth daily. (Patient taking differently: Take 40 mg by mouth at bedtime. ) 90 tablet 0  .  azelastine (ASTELIN) 0.1 % nasal spray Place 2 sprays into both nostrils at bedtime.     . furosemide (LASIX) 20 MG tablet Take 1 tablet (20 mg total) by mouth daily. 90 tablet 3  . guaiFENesin (MUCINEX) 600 MG 12 hr tablet Take 1 tablet (600 mg total) by mouth 2 (two) times daily as needed for cough or to loosen phlegm.    . metoprolol tartrate (LOPRESSOR) 25 MG tablet Take 0.5 tablets (12.5 mg total) by mouth 2 (two) times daily.    . montelukast (SINGULAIR) 10 MG tablet Take 1 tablet (10 mg total) by mouth at bedtime. 90 tablet 1  . Multiple Vitamin (MULTIVITAMIN WITH MINERALS) TABS tablet Take 1 tablet by mouth daily. ONE-A-DAY    . nicotine (NICODERM CQ - DOSED IN MG/24 HOURS) 21 mg/24hr patch Place 1 patch (21 mg  total) onto the skin daily. 28 patch 0  . oxyCODONE (OXY IR/ROXICODONE) 5 MG immediate release tablet Take 5 mg by mouth every 4-6 hours PRN severe pain. 30 tablet 0  . tamsulosin (FLOMAX) 0.4 MG CAPS capsule Take 1 capsule (0.4 mg total) by mouth daily. (Patient taking differently: Take 0.4 mg by mouth daily after supper. ) 90 capsule 3   No current facility-administered medications for this visit.     Allergies  Allergen Reactions  . No Known Allergies     Review of Systems  Overall improved No shortness of breath  BP (!) 160/93   Pulse 65   Resp 20   Ht 6\' 3"  (1.905 m)   Wt 178 lb (80.7 kg)   SpO2 93% Comment: RA  BMI 22.25 kg/m  Physical Exam      Exam    General- alert and comfortable   Lungs- clear without rales, wheezes   Cor- regular rate and rhythm, no murmur , gallop   Abdomen- soft, non-tender   Extremities - warm, non-tender, minimal edema   Neuro- oriented, appropriate, no focal weakness   Diagnostic Tests: Chest x-ray with COPD, trace left pleural effusion  Impression: Continued progress after multivessel CABG  Plan: Add lisinopril for hypertension Stop amiodarone for maintained sinus rhythm postop Continue sternal precautions Return for follow-up visit with chest x-ray in 4 weeks  Mikey Bussing, MD [facility] [phone]

## 2017-08-30 ENCOUNTER — Other Ambulatory Visit: Payer: Self-pay

## 2017-08-30 MED ORDER — METOPROLOL TARTRATE 25 MG PO TABS: 12.5000 mg | ORAL_TABLET | Freq: Two times a day (BID) | ORAL | 3 refills | Status: DC

## 2017-08-30 NOTE — Telephone Encounter (Signed)
Please change the no print on the current rx to normal so it will go to the pharmacy please. Thanks.

## 2017-09-03 ENCOUNTER — Telehealth: Payer: Self-pay | Admitting: Unknown Physician Specialty

## 2017-09-03 DIAGNOSIS — I2510 Atherosclerotic heart disease of native coronary artery without angina pectoris: Secondary | ICD-10-CM | POA: Diagnosis not present

## 2017-09-03 DIAGNOSIS — Z48812 Encounter for surgical aftercare following surgery on the circulatory system: Secondary | ICD-10-CM | POA: Diagnosis not present

## 2017-09-03 NOTE — Telephone Encounter (Signed)
Jesse, RN with [facility] called and stated that the pt has been discharged as of today with goals met.  °

## 2017-09-03 NOTE — Telephone Encounter (Signed)
Routing to provider, FYI.  

## 2017-09-17 ENCOUNTER — Ambulatory Visit: Payer: Self-pay | Admitting: Unknown Physician Specialty

## 2017-09-18 ENCOUNTER — Encounter: Payer: Self-pay | Admitting: Physician Assistant

## 2017-09-18 ENCOUNTER — Ambulatory Visit (INDEPENDENT_AMBULATORY_CARE_PROVIDER_SITE_OTHER): Payer: Medicare Other | Admitting: Physician Assistant

## 2017-09-18 VITALS — BP 154/84 | HR 55 | Ht 75.0 in | Wt 190.8 lb

## 2017-09-18 DIAGNOSIS — I10 Essential (primary) hypertension: Secondary | ICD-10-CM | POA: Diagnosis not present

## 2017-09-18 DIAGNOSIS — I2510 Atherosclerotic heart disease of native coronary artery without angina pectoris: Secondary | ICD-10-CM | POA: Diagnosis not present

## 2017-09-18 DIAGNOSIS — E782 Mixed hyperlipidemia: Secondary | ICD-10-CM

## 2017-09-18 DIAGNOSIS — I480 Paroxysmal atrial fibrillation: Secondary | ICD-10-CM | POA: Diagnosis not present

## 2017-09-18 DIAGNOSIS — I200 Unstable angina: Secondary | ICD-10-CM

## 2017-09-18 DIAGNOSIS — R0602 Shortness of breath: Secondary | ICD-10-CM | POA: Diagnosis not present

## 2017-09-18 MED ORDER — LISINOPRIL 20 MG PO TABS: 20.0000 mg | ORAL_TABLET | Freq: Every day | ORAL | 3 refills | Status: DC

## 2017-09-18 NOTE — Patient Instructions (Signed)
Medication Instructions:  Your physician has recommended you make the following change in your medication:  1- INCREASE Lisinopril to 20 mg by mouth once a day.   Labwork: Your physician recommends that you return for lab work in: TODAY (BMP).   Testing/Procedures: none  Follow-Up: Your physician recommends that you schedule a follow-up appointment in: 3 MONTHS WITH DR END.   If you need a refill on your cardiac medications before your next appointment, please call your pharmacy.

## 2017-09-18 NOTE — Progress Notes (Signed)
Cardiology Office Note Date:  09/18/2017  Patient ID:  Brendan [name] [birth year], MRN [id number] PCP:  [name], NP  Cardiologist:  Dr. Okey Dupre, MD    Chief Complaint: Follow up CAD  History of Present Illness: Brendan [name] is a 66 y.o. male with history of CAD s/p 4-vessel CABG (LIMA-LAD, SVG-D2, SVG-OM, SVG-PDA) on 07/02/2017, HTN, HLD, DM, and tobacco abuse who presents for follow up of CAD.  He was seen on 05/07/17, at which time he continued to have exertional angina that persisted despite medical therapy. He underwent cardiac catheterization showing three-vessel coronary artery disease. He underwent 4 vessel CABG by Dr. Donata Clay on 07/02/17. Postoperative course was complicated by pneumothorax requiring chest tube placement along with perioperative Afib. It did not appear he had any further Afib at follow up on 8/1. He was continued on amiodarone 200 mg daily with plans to stop in the near future. In follow up, he was gradually recovering, though continued to have shortness of breath and required supplemental oxygen. He was at a rehabilitation facility due to deconditioning. He saw CVTS on 8/8 for routine follow up with CXR showing moderate-large pleft pleural effusion. He underwent successful left-sided thoracentesis in the office with 1.2 L of serosanguineous fluid removed along with symptom improvement. He has since followed up with CVTS on 8/29 and was doing well with follow up CXR showing resolution of left pleural effusion. His amiodarone was stopped. He was started on lisinopril given elevated BP of 160 mmHg systolic. He was continued on Lasix 20 mg daily. Most recent HGB drawn by PCP on 8/21 showed improvement to 13.4. Most recent LDL of 50 from 8/21. SCr from 8/21 of 1.09.    He is doing much better now. No further SOB s/p thoracentesis. No on room air and has not needed any further supplemental oxygen. No chest pain. A little soreness at his surgical site, though this is improving.  Continues to be active and has not had any concerning symptoms. Following CVTS post-operative guidelines. Does not check his BP at home. Taking all medications as directed without adverse effect. He is "eating good." Not really watching what he is eating unfortunately, including salt. Lastly, he notes mild right ankle swelling if he is on his feet for a prolonged time period since his surgery. Not interested in cardiac rehab.   Cardiovascular History & Procedures:  Cardiovascular Problems:   Coronary artery disease status post CABG    Risk Factors:   Hypertension, hyperlipidemia, diabetes mellitus, tobacco use, male gender, age greater than 30    Cath/PCI:   LHC (06/04/17): LMCA with 20-30% disease. LAD with 50% proximal and 90% mid vessel stenosis. Moderate-caliber D1 with 50% proximal disease. Moderate-Cal 2 with 80% ostial disease. LCx with sequential 90 and 70% ostial stenoses involving moderate-caliber OM 2. Large, dominant RCA with diffuse proximal and mid vessel disease with sequential 80 and 90% stenoses. PDA with mild diffuse disease. RPL1 with 50% proximal stenosis. LVEF 50-55% with upper normal LVEDP.    CV Surgery:   CABG (07/02/17, Dr. Donata Clay): LIMA to LAD, SVG to D2, SVG to OM, and SVG to PDA.    EP Procedures and Devices:   None    Non-Invasive Evaluation(s):   TTE (06/28/16, [facility]): Normal LV size and function (EF 55%). Grade 1 diastolic dysfunction. Normal right ventricular function. Mild tricuspid and mitral regurgitation. No valvular stenosis.   Pharmacologic myocardial perfusion stress test (06/28/16, [facility]):Homogeneous tracer uptake without ischemia or scar.  LVEF 58%.   Past Medical History:  Diagnosis Date  . Chest pain    a. 05/2016 Lexi MV: EF 58%, no ischemia/infarct.  . Coronary artery disease   . Diabetes mellitus without complication (HCC)   . Diastolic dysfunction    a. 05/2016 Echo: EF 55%, Gr1 DD, mild TR/MR.  [name] Dyspnea   .  Hyperlipidemia   . Hypertension   . Personal history of tobacco use, presenting hazards to health 05/03/2016    Past Surgical History:  Procedure Laterality Date  . BACK SURGERY    . CORONARY ARTERY BYPASS GRAFT N/A 07/01/2017   Procedure: CORONARY ARTERY BYPASS GRAFTING (CABG) x4, using left mammary artery and right greater saphenous vein harvested endoscopically;  Surgeon: Kerin Perna, MD;  Location: [facility] OR;  Service: Open Heart Surgery;  Laterality: N/A;  . KNEE SURGERY    . LEFT HEART CATH AND CORONARY ANGIOGRAPHY N/A 06/04/2017   Procedure: Left Heart Cath and Coronary Angiography;  Surgeon: Yvonne Kendall, MD;  Location: [facility] INVASIVE CV LAB;  Service: Cardiovascular;  Laterality: N/A;  . TEE WITHOUT CARDIOVERSION N/A 07/01/2017   Procedure: TRANSESOPHAGEAL ECHOCARDIOGRAM (TEE);  Surgeon: Donata Clay, Theron Arista, MD;  Location: [facility] OR;  Service: Open Heart Surgery;  Laterality: N/A;    Current Meds  Medication Sig  . acetaminophen (TYLENOL) 500 MG tablet Take 1 tablet (500 mg total) by mouth every 6 (six) hours as needed for mild pain or headache.  . albuterol (PROVENTIL HFA;VENTOLIN HFA) 108 (90 Base) MCG/ACT inhaler Inhale 2 puffs into the lungs every 6 (six) hours as needed for wheezing or shortness of breath.  [name] aspirin EC 325 MG EC tablet Take 1 tablet (325 mg total) by mouth daily.  [name] atorvastatin (LIPITOR) 40 MG tablet Take 1 tablet (40 mg total) by mouth daily. (Patient taking differently: Take 40 mg by mouth at bedtime. )  . azelastine (ASTELIN) 0.1 % nasal spray Place 2 sprays into both nostrils at bedtime.   . furosemide (LASIX) 20 MG tablet Take 1 tablet (20 mg total) by mouth daily.  [name] guaiFENesin (MUCINEX) 600 MG 12 hr tablet Take 1 tablet (600 mg total) by mouth 2 (two) times daily as needed for cough or to loosen phlegm.  [name] lisinopril (PRINIVIL,ZESTRIL) 10 MG tablet Take 10 mg by mouth daily.  . metoprolol tartrate (LOPRESSOR) 25 MG tablet Take 0.5 tablets (12.5 mg total) by  mouth 2 (two) times daily.  . montelukast (SINGULAIR) 10 MG tablet Take 1 tablet (10 mg total) by mouth at bedtime.  . tamsulosin (FLOMAX) 0.4 MG CAPS capsule Take 1 capsule (0.4 mg total) by mouth daily. (Patient taking differently: Take 0.4 mg by mouth daily after supper. )    Allergies:   No known allergies   Social History:  The patient  reports that he quit smoking about 2 months ago. His smoking use included Cigarettes. He has a 60.00 pack-year smoking history. He has never used smokeless tobacco. He reports that he does not drink alcohol or use drugs.   Family History:  The patient's family history includes Diabetes in his mother; Heart disease (age of onset: 107) in his father; Hypertension in his mother.  ROS:   Review of Systems  Constitutional: Negative for chills, diaphoresis, fever, malaise/fatigue and weight loss.  HENT: Negative for congestion.   Eyes: Negative for discharge and redness.  Respiratory: Negative for cough, hemoptysis, sputum production, shortness of breath and wheezing.   Cardiovascular: Negative for chest pain, palpitations, orthopnea, claudication,  leg swelling and PND.       Soreness at surgical site, improving  Gastrointestinal: Negative for abdominal pain, blood in stool, heartburn, melena, nausea and vomiting.  Genitourinary: Negative for hematuria.  Musculoskeletal: Negative for falls and myalgias.  Skin: Negative for rash.  Neurological: Negative for dizziness, tingling, tremors, sensory change, speech change, focal weakness, loss of consciousness and weakness.  Endo/Heme/Allergies: Does not bruise/bleed easily.  Psychiatric/Behavioral: Negative for substance abuse. The patient is not nervous/anxious.      PHYSICAL EXAM:  VS:  BP (!) 154/84 (BP Location: Left Arm, Patient Position: Sitting, Cuff Size: Normal)   Pulse (!) 55   Ht  (1.905 m)   Wt 190 lb 12 oz (86.5 kg)   BMI 23.84 kg/m  BMI: Body mass index is 23.84 kg/m.  Physical Exam    Constitutional: He is oriented to person, place, and time. He appears well-developed and well-nourished.  HENT:  Head: Normocephalic and atraumatic.  Eyes: Right eye exhibits no discharge. Left eye exhibits no discharge.  Neck: Normal range of motion. No JVD present.  Cardiovascular: Regular rhythm, S1 normal, S2 normal and normal heart sounds.  Bradycardia present.  Exam reveals no distant heart sounds, no friction rub, no midsystolic click and no opening snap.   No murmur heard. Well healing anterior, midline chest wall surgical wound.  Pulmonary/Chest: Effort normal and breath sounds normal. No respiratory distress. He has no decreased breath sounds. He has no wheezes. He has no rales. He exhibits no tenderness.  Abdominal: Soft. He exhibits no distension. There is no tenderness.  Musculoskeletal: He exhibits no edema.  Neurological: He is alert and oriented to person, place, and time.  Skin: Skin is warm and dry. No cyanosis. Nails show no clubbing.  Psychiatric: He has a normal mood and affect. His speech is normal and behavior is normal. Judgment and thought content normal.     EKG:  Was ordered and interpreted by me today. Shows sinus bradycardia, 55 bpm, no acute st/t changes  Recent Labs: 07/02/2017: Magnesium 2.3 08/20/2017: ALT 11; BUN 13; Creatinine, Ser 1.09; Hemoglobin WILL FOLLOW; Platelets WILL FOLLOW; Potassium 4.4; Sodium 145  08/20/2017: Cholesterol, Total 112; HDL 47; LDL Calculated 50; Triglycerides 75   CrCl cannot be calculated (Patient's most recent lab result is older than the maximum 21 days allowed.).   Wt Readings from Last 3 Encounters:  09/18/17 190 lb 12 oz (86.5 kg)  08/28/17 178 lb (80.7 kg)  08/20/17 177 lb 3.2 oz (80.4 kg)     Other studies reviewed: Additional studies/records reviewed today include: summarized above  ASSESSMENT AND PLAN:  1. CAD without angina: Status post revascularization as above. No symptoms concerning for ischemia. Continue  ASA, metoprolol, and Lipitor. Has participated with cardiac rehab while at [facility]. Continues to advance activity as tolerated within post-operative guidelines. Follows up with CVTS next week. No plans for further ischemic evaluation at this time. The mild ankle swelling is likely 2/2 surgical vein harvest and excess salt intake. EF normal by TEE. Recommend elevation of leg, limit salt intake, wear compression stocking if needed, and continue low-dose Lasix pending renal function.   2. SOB: Resolved. Now on room air without issues. Weight is noted to be higher today at 190 pounds when compared to 182 pounds in our office on 8/1. Patient indicates "I'm eating good" and is not watching his calories or salt intake. Advised patient to eat a heart healthy diet and limit salt intake. Continue Lasix 20  mg daily as above.   3. PAF: In the perioperative setting. Amiodarone has been discontinued. No evidence of recurrence. Follow. No plans for full-dose anticoagulation.   4. HTN: BP has been mildly elevated in his recent follow up visits. Has been started on lisinopril in late August along with continuation of Lopressor and Lasix. BP today remains mildly elevated at 154 systolic. Heart rate is mildly bradycardic at 55 bpm. He is asymptomatic. His heart rate precludes titration of beta blocker for added hypertensive effect. Increase lisinopril to 20 mg daily. Check bmet. Consider addition of amlodipine 5 mg daily (has normal EF by TEE).   5. HLD: Continue Lipitor. Recent LDL at goal as above.   Disposition: F/u with Dr. Okey Dupre in 3 months.   Current medicines are reviewed at length with the patient today.  The patient did not have any concerns regarding medicines.  Elinor Dodge PA-C 09/18/2017 1:24 PM     [facility] [address] [place], [place] [address] [phone]

## 2017-09-19 LAB — BASIC METABOLIC PANEL
BUN/Creatinine Ratio: 15 (ref 10–24)
BUN: 16 mg/dL (ref 8–27)
CALCIUM: 9.1 mg/dL (ref 8.6–10.2)
CO2: 23 mmol/L (ref 20–29)
CREATININE: 1.04 mg/dL (ref 0.76–1.27)
Chloride: 106 mmol/L (ref 96–106)
GFR calc Af Amer: 86 mL/min/{1.73_m2} (ref >59)
GFR, EST NON AFRICAN AMERICAN: 74 mL/min/{1.73_m2} (ref >59)
Glucose: 88 mg/dL (ref 65–99)
Potassium: 3.9 mmol/L (ref 3.5–5.2)
Sodium: 143 mmol/L (ref 134–144)

## 2017-09-24 ENCOUNTER — Other Ambulatory Visit: Payer: Self-pay | Admitting: Cardiothoracic Surgery

## 2017-09-24 DIAGNOSIS — Z951 Presence of aortocoronary bypass graft: Secondary | ICD-10-CM

## 2017-09-25 ENCOUNTER — Encounter: Payer: Self-pay | Admitting: Cardiothoracic Surgery

## 2017-09-25 ENCOUNTER — Ambulatory Visit (INDEPENDENT_AMBULATORY_CARE_PROVIDER_SITE_OTHER): Payer: Self-pay | Admitting: Cardiothoracic Surgery

## 2017-09-25 ENCOUNTER — Ambulatory Visit
Admission: RE | Admit: 2017-09-25 | Discharge: 2017-09-25 | Disposition: A | Discharge status: Home / Self Care | Payer: Medicare Other | Source: Ambulatory Visit | Attending: Cardiothoracic Surgery | Admitting: Cardiothoracic Surgery

## 2017-09-25 VITALS — BP 149/77 | HR 52 | Resp 16 | Ht 75.0 in | Wt 190.0 lb

## 2017-09-25 DIAGNOSIS — J90 Pleural effusion, not elsewhere classified: Secondary | ICD-10-CM | POA: Diagnosis not present

## 2017-09-25 DIAGNOSIS — Z951 Presence of aortocoronary bypass graft: Secondary | ICD-10-CM

## 2017-09-25 NOTE — Progress Notes (Signed)
PCP is [name], NP Referring Provider is End, Cristal Deer, MD  Chief Complaint  Patient presents with  . Routine Post Op    1 month f/u with a CXR    HPI: Final postop visit for CABG 4 almost 3 months ago The patient post discharge developed a large left pleural effusion requiring thoracentesis in the office He returns today for evaluation with a chest x-ray. He has no symptoms of angina or shortness of breath. Surgical incisions are healing well and chest x-ray today shows no recurrence of left pleural effusion. He is currently taking Lasix 20 mg a day he states he is stop smoking.  Past Medical History:  Diagnosis Date  . Chest pain    a. 05/2016 Lexi MV: EF 58%, no ischemia/infarct.  . Coronary artery disease   . Diabetes mellitus without complication (HCC)   . Diastolic dysfunction    a. 05/2016 Echo: EF 55%, Gr1 DD, mild TR/MR.  [name] Dyspnea   . Hyperlipidemia   . Hypertension   . Personal history of tobacco use, presenting hazards to health 05/03/2016    Past Surgical History:  Procedure Laterality Date  . BACK SURGERY    . CORONARY ARTERY BYPASS GRAFT N/A 07/01/2017   Procedure: CORONARY ARTERY BYPASS GRAFTING (CABG) x4, using left mammary artery and right greater saphenous vein harvested endoscopically;  Surgeon: Kerin Perna, MD;  Location: [facility] OR;  Service: Open Heart Surgery;  Laterality: N/A;  . KNEE SURGERY    . LEFT HEART CATH AND CORONARY ANGIOGRAPHY N/A 06/04/2017   Procedure: Left Heart Cath and Coronary Angiography;  Surgeon: Yvonne Kendall, MD;  Location: [facility] INVASIVE CV LAB;  Service: Cardiovascular;  Laterality: N/A;  . TEE WITHOUT CARDIOVERSION N/A 07/01/2017   Procedure: TRANSESOPHAGEAL ECHOCARDIOGRAM (TEE);  Surgeon: Donata Clay, Theron Arista, MD;  Location: [facility] OR;  Service: Open Heart Surgery;  Laterality: N/A;    Family History  Problem Relation Age of Onset  . Hypertension Mother   . Diabetes Mother   . Heart disease Father 3       multiple MI's     Social History Social History  Substance Use Topics  . Smoking status: Former Smoker    Packs/day: 1.00    Years: 60.00    Types: Cigarettes    Quit date: 06/29/2017  . Smokeless tobacco: Never Used  . Alcohol use No     Comment: previously drank heavily but quit ~ 20 yrs ago.    Current Outpatient Prescriptions  Medication Sig Dispense Refill  . acetaminophen (TYLENOL) 500 MG tablet Take 1 tablet (500 mg total) by mouth every 6 (six) hours as needed for mild pain or headache. 30 tablet 0  . albuterol (PROVENTIL HFA;VENTOLIN HFA) 108 (90 Base) MCG/ACT inhaler Inhale 2 puffs into the lungs every 6 (six) hours as needed for wheezing or shortness of breath. 1 Inhaler 2  . aspirin EC 325 MG EC tablet Take 1 tablet (325 mg total) by mouth daily. 30 tablet 0  . atorvastatin (LIPITOR) 40 MG tablet Take 1 tablet (40 mg total) by mouth daily. (Patient taking differently: Take 40 mg by mouth at bedtime. ) 90 tablet 0  . azelastine (ASTELIN) 0.1 % nasal spray Place 2 sprays into both nostrils at bedtime.     . furosemide (LASIX) 20 MG tablet Take 1 tablet (20 mg total) by mouth daily. 90 tablet 3  . guaiFENesin (MUCINEX) 600 MG 12 hr tablet Take 1 tablet (600 mg total) by mouth 2 (two)  times daily as needed for cough or to loosen phlegm.    [name] lisinopril (PRINIVIL,ZESTRIL) 20 MG tablet Take 1 tablet (20 mg total) by mouth daily. 90 tablet 3  . metoprolol tartrate (LOPRESSOR) 25 MG tablet Take 0.5 tablets (12.5 mg total) by mouth 2 (two) times daily. 90 tablet 3  . montelukast (SINGULAIR) 10 MG tablet Take 1 tablet (10 mg total) by mouth at bedtime. 90 tablet 1  . tamsulosin (FLOMAX) 0.4 MG CAPS capsule Take 1 capsule (0.4 mg total) by mouth daily. (Patient taking differently: Take 0.4 mg by mouth daily after supper. ) 90 capsule 3   No current facility-administered medications for this visit.     Allergies  Allergen Reactions  . 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 No symptoms angina or CHF  BP (!) 149/77 (BP Location: Left Arm, Patient Position: Sitting, Cuff Size: Large)   Pulse (!) 52   Resp 16   Ht  (1.905 m)   Wt 190 lb (86.2 kg)   SpO2 93% Comment: ON RA  BMI 23.75 kg/m  Physical Exam      Exam    General- alert and comfortable   Lungs- clear without rales, wheezes   Cor- regular rate and rhythm, no murmur , gallop   Abdomen- soft, non-tender   Extremities - warm, non-tender, minimal edema   Neuro- oriented, appropriate, no focal weakness   Diagnostic Tests: Chest x-ray clear no recurrent left pleural effusion  Impression: Sinus rhythm clear lungs doing well return to work-note provided No restrictions on activities after October 2. Patient encouraged to follow heart healthy diet and lifestyle, void tobacco and excessive sodium intake Plan: Return as needed   Mikey Bussing, MD [facility] [phone]

## 2017-10-08 ENCOUNTER — Telehealth: Payer: Self-pay

## 2017-10-08 MED ORDER — ATORVASTATIN CALCIUM 20 MG PO TABS: 20.0000 mg | ORAL_TABLET | Freq: Every day | ORAL | 3 refills | Status: DC

## 2017-10-08 NOTE — Telephone Encounter (Signed)
Pharmacy sent over a fax regarding patient's Atorvastatin 40 mg prescription. It states " patient reports taking 1/2 tablet of atorvastatin 40 mg daily due to muscle pain when taking as prescribed. If this is still recommended, a new prescription reflecting these instructions is needed."

## 2017-10-28 DIAGNOSIS — H2513 Age-related nuclear cataract, bilateral: Secondary | ICD-10-CM | POA: Diagnosis not present

## 2017-10-28 DIAGNOSIS — E119 Type 2 diabetes mellitus without complications: Secondary | ICD-10-CM | POA: Diagnosis not present

## 2017-10-28 LAB — HM DIABETES EYE EXAM

## 2017-11-17 IMAGING — DX DG CHEST 1V PORT
2 series · 2 of 2 positions shown · non-contrast
Comparison: Portable chest x-ray Saturday July, 2017 at 9799 hours.

CLINICAL DATA: Status post CABG on July 01, 2017

EXAM:
PORTABLE CHEST 1 VIEW

[chest ap (1 of 2)]
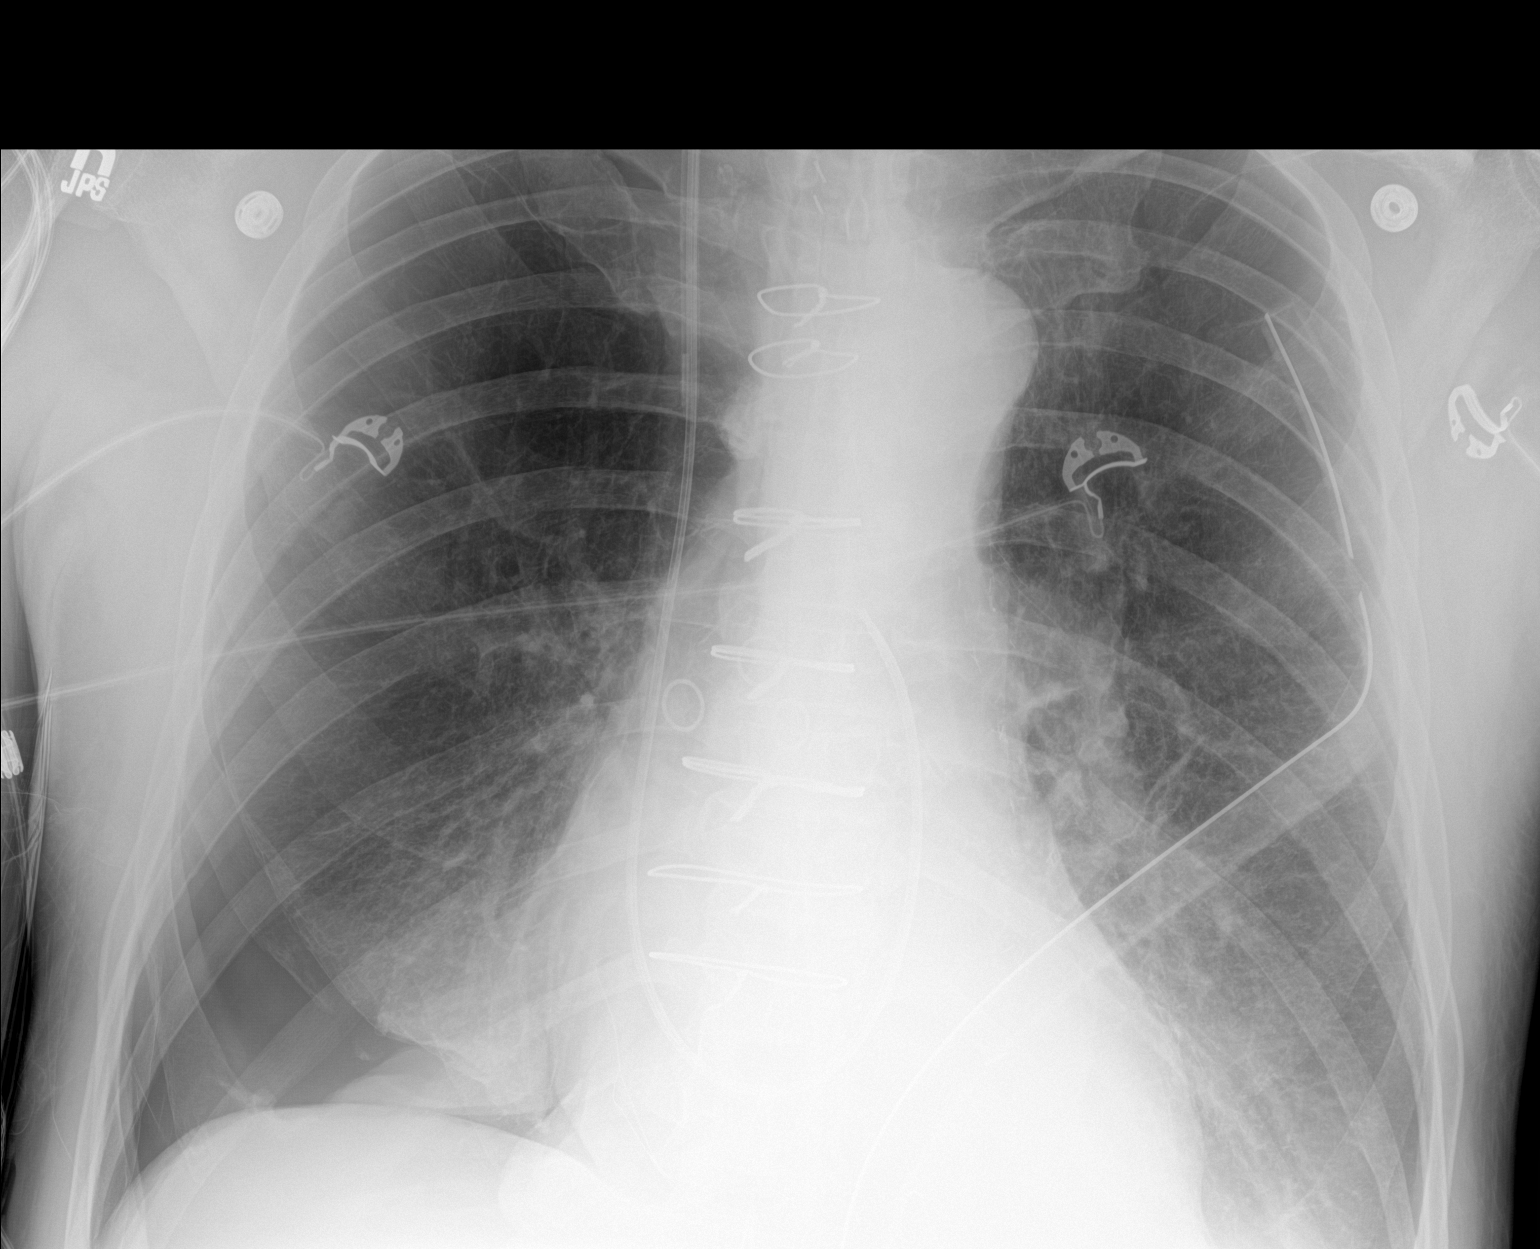

[chest ap (2 of 2)]
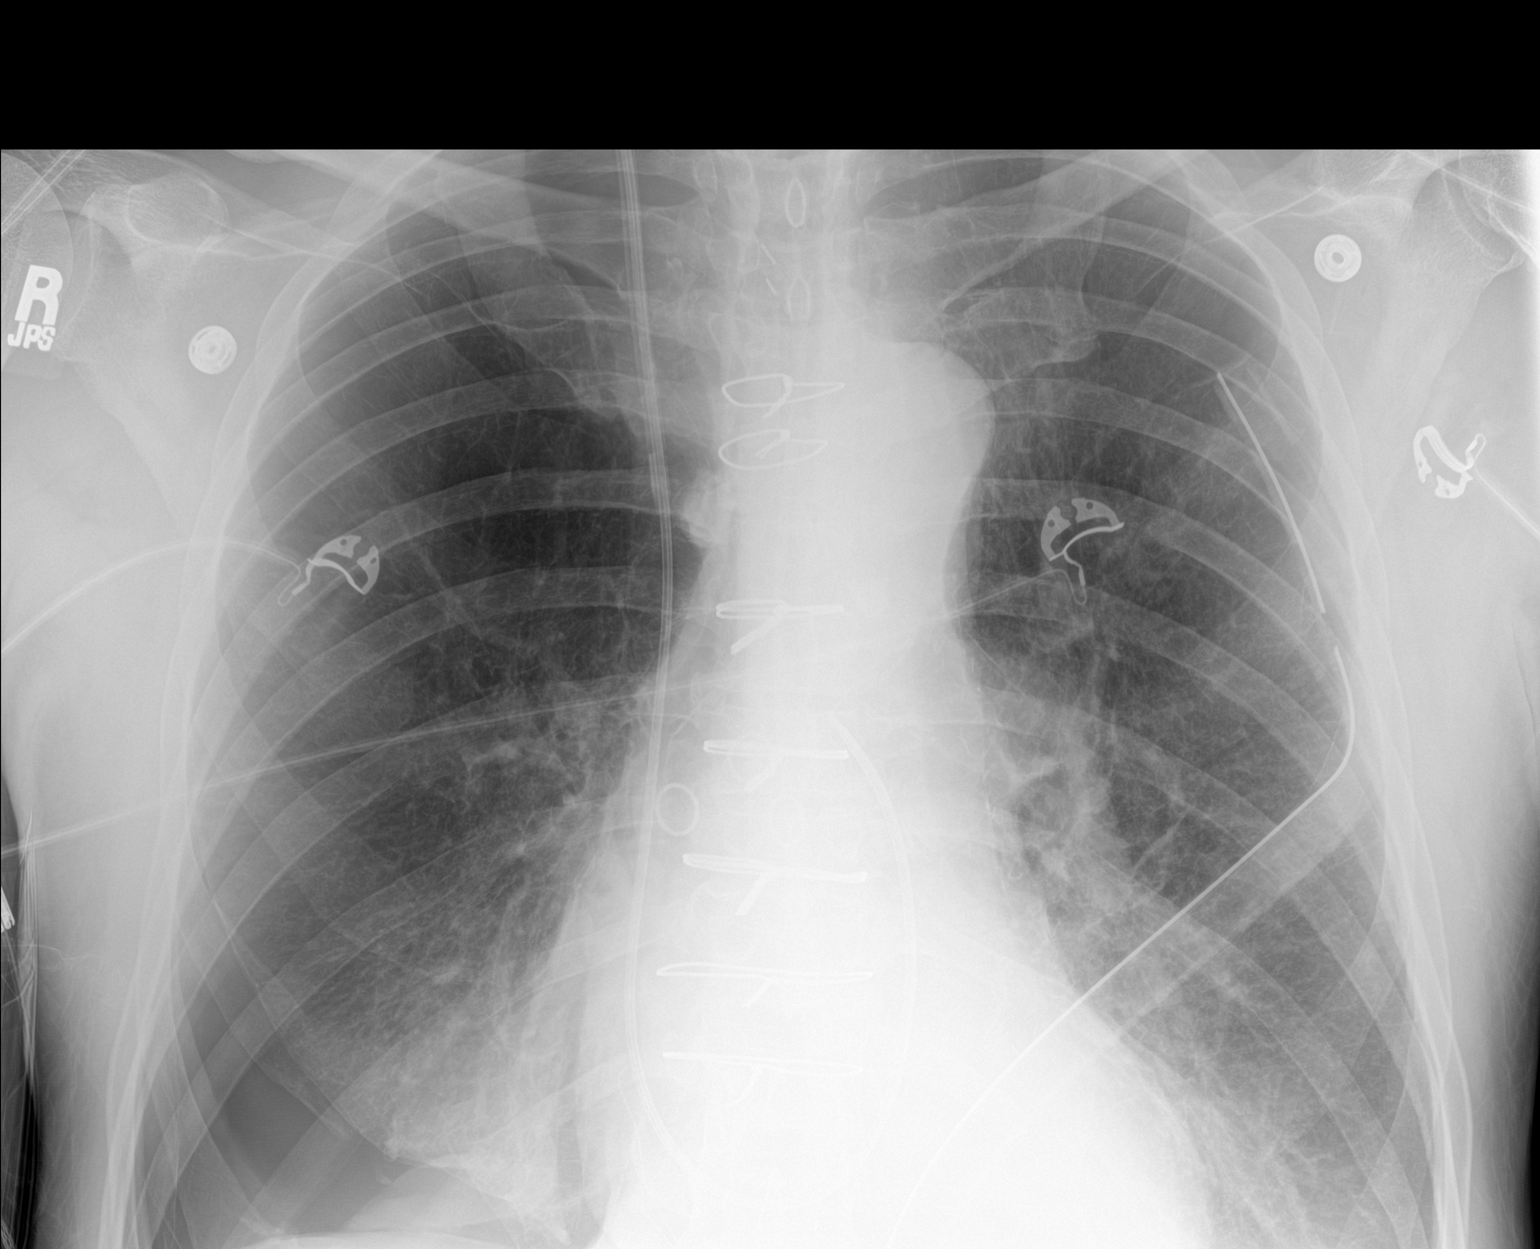

[2 of 2 positions shown; findings below may reference images not displayed]

FINDINGS: Interval extubation of the trachea and esophagus. There is a new
pneumothorax on the right amounting to approximately 25-30% of the
lung volume. On the left the lung is well-expanded. The left chest
tube is in stable position overlying the posterior aspect of the
left fifth rib. There is no mediastinal shift. There are coarse lung
markings in the infrahilar regions bilaterally which are stable. The
heart and pulmonary vascularity are normal. The Swan-Ganz catheter
tip projects in the distal main pulmonary artery.
IMPRESSION: New approximately 30% right-sided pneumothorax seen best at the lung
base. No left pneumothorax. Mild bibasilar subsegmental atelectasis.
Stable support devices. Critical Value/emergent results were called
by me by telephone at the time of interpretation on 07/02/2017 at [DATE] to Dr. Ell Gaona, who verbally acknowledged these results.

## 2017-11-20 ENCOUNTER — Telehealth: Payer: Self-pay | Admitting: Unknown Physician Specialty

## 2017-11-20 ENCOUNTER — Ambulatory Visit (INDEPENDENT_AMBULATORY_CARE_PROVIDER_SITE_OTHER): Payer: Medicare Other | Admitting: Unknown Physician Specialty

## 2017-11-20 ENCOUNTER — Encounter: Payer: Self-pay | Admitting: Unknown Physician Specialty

## 2017-11-20 VITALS — BP 195/103 | HR 65 | Temp 98.4°F | Wt 202.2 lb

## 2017-11-20 DIAGNOSIS — I10 Essential (primary) hypertension: Secondary | ICD-10-CM | POA: Diagnosis not present

## 2017-11-20 DIAGNOSIS — I25810 Atherosclerosis of coronary artery bypass graft(s) without angina pectoris: Secondary | ICD-10-CM

## 2017-11-20 DIAGNOSIS — I200 Unstable angina: Secondary | ICD-10-CM

## 2017-11-20 DIAGNOSIS — E785 Hyperlipidemia, unspecified: Secondary | ICD-10-CM | POA: Diagnosis not present

## 2017-11-20 DIAGNOSIS — Z23 Encounter for immunization: Secondary | ICD-10-CM | POA: Diagnosis not present

## 2017-11-20 DIAGNOSIS — M542 Cervicalgia: Secondary | ICD-10-CM | POA: Insufficient documentation

## 2017-11-20 MED ORDER — AMLODIPINE BESYLATE 5 MG PO TABS
5.0000 mg | ORAL_TABLET | Freq: Every day | ORAL | 1 refills | Status: DC
Start: 2017-11-20 — End: 2017-12-17

## 2017-11-20 NOTE — Telephone Encounter (Signed)
Copied from CRM [id number]. Topic: General - Other >> Nov 20, 2017  3:24 PM Raquel SarnaHayes, Teresa G wrote:  [facility] [phone]   Called regarding pt's Rx needing to be filled. Doesn't know what to fill for pt.

## 2017-11-20 NOTE — Telephone Encounter (Signed)
Patient needs his medication sent [facility] that was discussed at his visit today.  Thanks

## 2017-11-20 NOTE — Assessment & Plan Note (Addendum)
Not to goal with SBP of 164 on recheck.  Discontinue Lisinopril for poorly tolerated and not effective.  Start Amlodipine 5 mg.

## 2017-11-20 NOTE — Telephone Encounter (Signed)
Prescription sent, patient notified.

## 2017-11-20 NOTE — Progress Notes (Signed)
BP (!) 195/103   Pulse 65   Temp 98.4 F (36.9 C) (Oral)   Wt 202 lb 3.2 oz (91.7 kg)   SpO2 96%   BMI 25.27 kg/m    Subjective:    Patient ID: Brendan PrimusLarry Spare, male    DOB: 12-26-1951, 66 y.o.   MRN: [id number]  HPI: Brendan [name] is a 66 y.o. male  Chief Complaint  Patient presents with  . Diabetes  . Hypertension  . Neck Pain    pt states he has been having neck pain that runs into his shoulder recently    Diabetes: Resolved with last Hgb A1C of 5.1% not treated.    Hypertension  High today.  BP was mildly high and put on 20 mg of Lisinopril but has felt without reducing BP States Lisinopril makes him tired, particularly on 20 mg, felt better on the 10 mg but still no well No chest pain with exertion or shortness of breath No Edema  Elevated Cholesterol Using medications without problems.  Last LDL is 50 No Muscle aches  Diet: Exercise: walks a lot  CAD CABG per  Dr. Morton PetersVan Tright and sees Dr. Okey DupreEnd  Neck pain Hurts and pops a lot.  Radiates to his shoulders.  Hurts worse with turning head.  Fractured in the past.  Tylenol helps  Relevant past medical, surgical, family and social history reviewed and updated as indicated. Interim medical history since our last visit reviewed. Allergies and medications reviewed and updated.  Review of Systems  HENT: Negative.   Gastrointestinal: Negative.   Genitourinary: Negative.   Psychiatric/Behavioral: Negative.     Per HPI unless specifically indicated above     Objective:    BP (!) 195/103   Pulse 65   Temp 98.4 F (36.9 C) (Oral)   Wt 202 lb 3.2 oz (91.7 kg)   SpO2 96%   BMI 25.27 kg/m   Wt Readings from Last 3 Encounters:  11/20/17 202 lb 3.2 oz (91.7 kg)  09/25/17 190 lb (86.2 kg)  09/18/17 190 lb 12 oz (86.5 kg)    Physical Exam  Constitutional: He is oriented to person, place, and time. He appears well-developed and well-nourished. No distress.  HENT:  Head: Normocephalic and atraumatic.  Eyes:  Conjunctivae and lids are normal. Right eye exhibits no discharge. Left eye exhibits no discharge. No scleral icterus.  Neck: Normal range of motion. Neck supple. No JVD present. Carotid bruit is not present.  Cardiovascular: Normal rate, regular rhythm and normal heart sounds.  Pulmonary/Chest: Effort normal and breath sounds normal. No respiratory distress.  Abdominal: Normal appearance. There is no splenomegaly or hepatomegaly.  Musculoskeletal:       Cervical back: He exhibits decreased range of motion.  Neurological: He is alert and oriented to person, place, and time.  Skin: Skin is warm, dry and intact. No rash noted. No pallor.  Psychiatric: He has a normal mood and affect. His behavior is normal. Judgment and thought content normal.    Results for orders placed or performed in visit on 09/18/17  Basic metabolic panel  Result Value Ref Range   Glucose 88 65 - 99 mg/dL   BUN 16 8 - 27 mg/dL   Creatinine, Ser 3.081.04 0.76 - 1.27 mg/dL   GFR calc non Af Amer 74 >59 mL/min/1.73   GFR calc Af Amer 86 >59 mL/min/1.73   BUN/Creatinine Ratio 15 10 - 24   Sodium 143 134 - 144 mmol/L   Potassium 3.9 3.5 -  5.2 mmol/L   Chloride 106 96 - 106 mmol/L   CO2 23 20 - 29 mmol/L   Calcium 9.1 8.6 - 10.2 mg/dL      Assessment & Plan:   Problem List Items Addressed This Visit      Unprioritized   Coronary artery disease    Regular f/u with cardilogy      Relevant Medications   furosemide (LASIX) 20 MG tablet   Hyperlipidemia LDL goal <70    Last LDL of 50.  Continue present meds      Relevant Medications   furosemide (LASIX) 20 MG tablet   Hypertension    Not to goal with SBP of 164 on recheck.  Discontinue Lisinopril for poorly tolerated and not effective.  Start Amlodipine 5 mg.        Relevant Medications   furosemide (LASIX) 20 MG tablet   Neck pain    Tylenol helps, will continue.  Exercises given.  X-ray cervical spine      Relevant Orders   DG Cervical Spine Complete     Other Visit Diagnoses    Need for influenza vaccination    -  Primary   Relevant Orders   Flu vaccine HIGH DOSE PF (Completed)       Follow up plan: Return in about 4 weeks (around 12/18/2017).

## 2017-11-20 NOTE — Assessment & Plan Note (Signed)
Regular f/u with cardilogy

## 2017-11-20 NOTE — Patient Instructions (Addendum)
Influenza (Flu) Vaccine (Inactivated or Recombinant): What You Need to Know 1. Why get vaccinated? Influenza ("flu") is a contagious disease that spreads around the [place] every year, usually between October and May. Flu is caused by influenza viruses, and is spread mainly by coughing, sneezing, and close contact. Anyone can get flu. Flu strikes suddenly and can last several days. Symptoms vary by age, but can include:  fever/chills  sore throat  muscle aches  fatigue  cough  headache  runny or stuffy nose  Flu can also lead to pneumonia and blood infections, and cause diarrhea and seizures in children. If you have a medical condition, such as heart or lung disease, flu can make it worse. Flu is more dangerous for some people. Infants and young children, people 65 years of age and older, pregnant women, and people with certain health conditions or a weakened immune system are at greatest risk. Each year thousands of people in the [place] die from flu, and many more are hospitalized. Flu vaccine can:  keep you from getting flu,  make flu less severe if you do get it, and  keep you from spreading flu to your family and other people. 2. Inactivated and recombinant flu vaccines A dose of flu vaccine is recommended every flu season. Children 6 months through 8 years of age may need two doses during the same flu season. Everyone else needs only one dose each flu season. Some inactivated flu vaccines contain a very small amount of a mercury-based preservative called thimerosal. Studies have not shown thimerosal in vaccines to be harmful, but flu vaccines that do not contain thimerosal are available. There is no live flu virus in flu shots. They cannot cause the flu. There are many flu viruses, and they are always changing. Each year a new flu vaccine is made to protect against three or four viruses that are likely to cause disease in the upcoming flu season. But even when the  vaccine doesn't exactly match these viruses, it may still provide some protection. Flu vaccine cannot prevent:  flu that is caused by a virus not covered by the vaccine, or  illnesses that look like flu but are not.  It takes about 2 weeks for protection to develop after vaccination, and protection lasts through the flu season. 3. Some people should not get this vaccine Tell the person who is giving you the vaccine:  If you have any severe, life-threatening allergies. If you ever had a life-threatening allergic reaction after a dose of flu vaccine, or have a severe allergy to any part of this vaccine, you may be advised not to get vaccinated. Most, but not all, types of flu vaccine contain a small amount of egg protein.  If you ever had Guillain-Barr Syndrome (also called GBS). Some people with a history of GBS should not get this vaccine. This should be discussed with your doctor.  If you are not feeling well. It is usually okay to get flu vaccine when you have a mild illness, but you might be asked to come back when you feel better.  4. Risks of a vaccine reaction With any medicine, including vaccines, there is a chance of reactions. These are usually mild and go away on their own, but serious reactions are also possible. Most people who get a flu shot do not have any problems with it. Minor problems following a flu shot include:  soreness, redness, or swelling where the shot was given  hoarseness  sore,   red or itchy eyes  cough  fever  aches  headache  itching  fatigue  If these problems occur, they usually begin soon after the shot and last 1 or 2 days. More serious problems following a flu shot can include the following:  There may be a small increased risk of Guillain-Barre Syndrome (GBS) after inactivated flu vaccine. This risk has been estimated at 1 or 2 additional cases per million people vaccinated. This is much lower than the risk of severe complications from  flu, which can be prevented by flu vaccine.  Young children who get the flu shot along with pneumococcal vaccine (PCV13) and/or DTaP vaccine at the same time might be slightly more likely to have a seizure caused by fever. Ask your doctor for more information. Tell your doctor if a child who is getting flu vaccine has ever had a seizure.  Problems that could happen after any injected vaccine:  People sometimes faint after a medical procedure, including vaccination. Sitting or lying down for about 15 minutes can help prevent fainting, and injuries caused by a fall. Tell your doctor if you feel dizzy, or have vision changes or ringing in the ears.  Some people get severe pain in the shoulder and have difficulty moving the arm where a shot was given. This happens very rarely.  Any medication can cause a severe allergic reaction. Such reactions from a vaccine are very rare, estimated at about 1 in a million doses, and would happen within a few minutes to a few hours after the vaccination. As with any medicine, there is a very remote chance of a vaccine causing a serious injury or death. The safety of vaccines is always being monitored. For more information, visit: http://www.aguilar.org/ 5. What if there is a serious reaction? What should I look for? Look for anything that concerns you, such as signs of a severe allergic reaction, very high fever, or unusual behavior. Signs of a severe allergic reaction can include hives, swelling of the face and throat, difficulty breathing, a fast heartbeat, dizziness, and weakness. These would start a few minutes to a few hours after the vaccination. What should I do?  If you think it is a severe allergic reaction or other emergency that can't wait, call 9-1-1 and get the person to the nearest hospital. Otherwise, call your doctor.  Reactions should be reported to the Vaccine Adverse Event Reporting System (VAERS). Your doctor should file this report, or you  can do it yourself through the VAERS web site at www.vaers.SamedayNews.es, or by calling 6094730752. ? VAERS does not give medical advice. 6. The National Vaccine Injury Compensation Program The [employer] Vaccine Injury Compensation Program (VICP) is a federal program that was created to compensate people who may have been injured by certain vaccines. Persons who believe they may have been injured by a vaccine can learn about the program and about filing a claim by calling 458-267-6070 or visiting the [employer] website at GoldCloset.com.ee. There is a time limit to file a claim for compensation. 7. How can I learn more?  Ask your healthcare provider. He or she can give you the vaccine package insert or suggest other sources of information.  Call your local or state health department.  Contact the Centers for Disease Control and Prevention (CDC): ? Call (540)164-9661 (1-800-CDC-INFO) or ? Visit CDC's website at https://gibson.com/ Vaccine Information Statement, Inactivated Influenza Vaccine (08/06/2014) This information is not intended to replace advice given to you by your health care provider. Make sure  you discuss any questions you have with your health care provider ------------------------------------------------------------------  Cervical Strain and Sprain Rehab Ask your health care provider which exercises are safe for you. Do exercises exactly as told by your health care provider and adjust them as directed. It is normal to feel mild stretching, pulling, tightness, or discomfort as you do these exercises, but you should stop right away if you feel sudden pain or your pain gets worse.Do not begin these exercises until told by your health care provider. Stretching and range of motion exercises These exercises warm up your muscles and joints and improve the movement and flexibility of your neck. These exercises also help to relieve pain, numbness, and tingling. Exercise A: Cervical  side bend  1. Using good posture, sit on a stable chair or stand up. 2. Without moving your shoulders, slowly tilt your left / right ear to your shoulder until you feel a stretch in your neck muscles. You should be looking straight ahead. 3. Hold for __________ seconds. 4. Repeat with the other side of your neck. Repeat __________ times. Complete this exercise __________ times a day. Exercise B: Cervical rotation  1. Using good posture, sit on a stable chair or stand up. 2. Slowly turn your head to the side as if you are looking over your left / right shoulder. ? Keep your eyes level with the ground. ? Stop when you feel a stretch along the side and the back of your neck. 3. Hold for __________ seconds. 4. Repeat this by turning to your other side. Repeat __________ times. Complete this exercise __________ times a day. Exercise C: Thoracic extension and pectoral stretch 1. Roll a towel or a small blanket so it is about 4 inches (10 cm) in diameter. 2. Lie down on your back on a firm surface. 3. Put the towel lengthwise, under your spine in the middle of your back. It should not be not under your shoulder blades. The towel should line up with your spine from your middle back to your lower back. 4. Put your hands behind your head and let your elbows fall out to your sides. 5. Hold for __________ seconds. Repeat __________ times. Complete this exercise __________ times a day. Strengthening exercises These exercises build strength and endurance in your neck. Endurance is the ability to use your muscles for a long time, even after your muscles get tired. Exercise D: Upper cervical flexion, isometric 1. Lie on your back with a thin pillow behind your head and a small rolled-up towel under your neck. 2. Gently tuck your chin toward your chest and nod your head down to look toward your feet. Do not lift your head off the pillow. 3. Hold for __________ seconds. 4. Release the tension slowly.  Relax your neck muscles completely before you repeat this exercise. Repeat __________ times. Complete this exercise __________ times a day. Exercise E: Cervical extension, isometric  1. Stand about 6 inches (15 cm) away from a wall, with your back facing the wall. 2. Place a soft object, about 6-8 inches (15-20 cm) in diameter, between the back of your head and the wall. A soft object could be a small pillow, a ball, or a folded towel. 3. Gently tilt your head back and press into the soft object. Keep your jaw and forehead relaxed. 4. Hold for __________ seconds. 5. Release the tension slowly. Relax your neck muscles completely before you repeat this exercise. Repeat __________ times. Complete this exercise __________ times a day.  Posture and body mechanics  Body mechanics refers to the movements and positions of your body while you do your daily activities. Posture is part of body mechanics. Good posture and healthy body mechanics can help to relieve stress in your body's tissues and joints. Good posture means that your spine is in its natural S-curve position (your spine is neutral), your shoulders are pulled back slightly, and your head is not tipped forward. The following are general guidelines for applying improved posture and body mechanics to your everyday activities. Standing  When standing, keep your spine neutral and keep your feet about hip-width apart. Keep a slight bend in your knees. Your ears, shoulders, and hips should line up.  When you do a task in which you stand in one place for a long time, place one foot up on a stable object that is 2-4 inches (5-10 cm) high, such as a footstool. This helps keep your spine neutral. Sitting   When sitting, keep your spine neutral and your keep feet flat on the floor. Use a footrest, if necessary, and keep your thighs parallel to the floor. Avoid rounding your shoulders, and avoid tilting your head forward.  When working at a desk or a  computer, keep your desk at a height where your hands are slightly lower than your elbows. Slide your chair under your desk so you are close enough to maintain good posture.  When working at a computer, place your monitor at a height where you are looking straight ahead and you do not have to tilt your head forward or downward to look at the screen. Resting When lying down and resting, avoid positions that are most painful for you. Try to support your neck in a neutral position. You can use a contour pillow or a small rolled-up towel. Your pillow should support your neck but not push on it. This information is not intended to replace advice given to you by your health care provider. Make sure you discuss any questions you have with your health care provider. Document Released: 12/17/2005 Document Revised: 08/23/2016 Document Reviewed: 11/23/2015 Elsevier Interactive Patient Education  [employer]2018 [employer]

## 2017-11-20 NOTE — Telephone Encounter (Signed)
Medication has been ordered.

## 2017-11-20 NOTE — Addendum Note (Signed)
Addended by: Gabriel CirriWICKER, Clarke Amburn on: 11/20/2017 03:32 PM   Modules accepted: Orders

## 2017-11-20 NOTE — Assessment & Plan Note (Signed)
Last LDL of 50.  Continue present meds

## 2017-11-20 NOTE — Assessment & Plan Note (Signed)
Tylenol helps, will continue.  Exercises given.  X-ray cervical spine

## 2017-11-21 IMAGING — CR DG CHEST 2V
3 series · 3 of 3 positions shown · non-contrast
Comparison: 07/05/2017

CLINICAL DATA: Status post CABG.  Chest tube.

EXAM:
CHEST  2 VIEW

[chest lat]
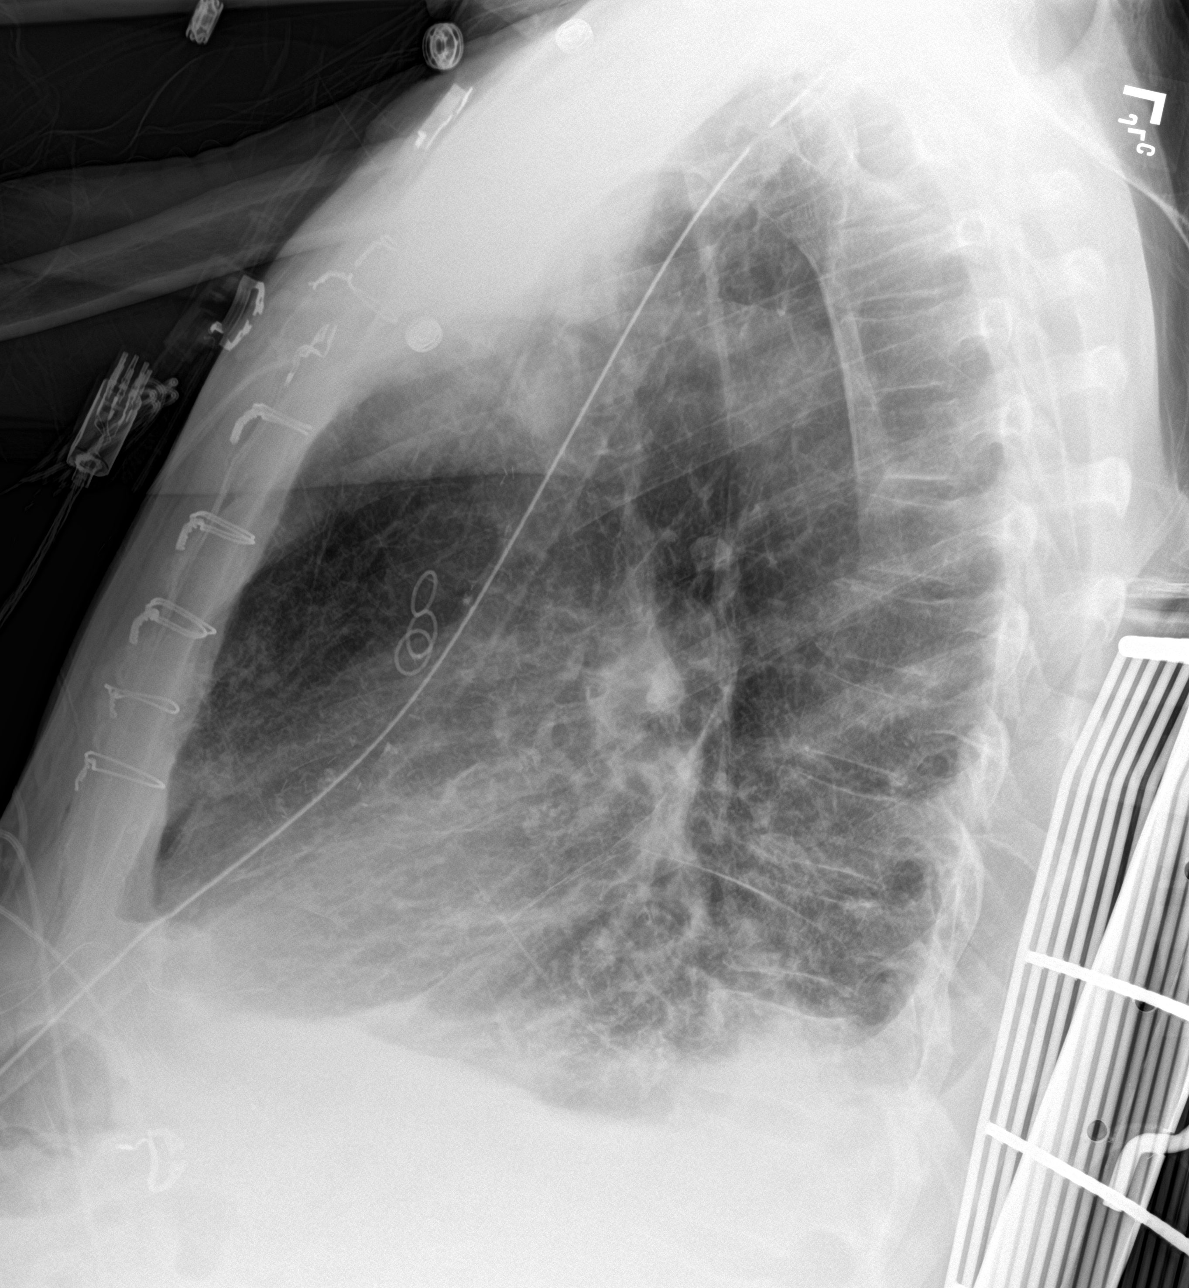

[chest ap (1 of 2)]
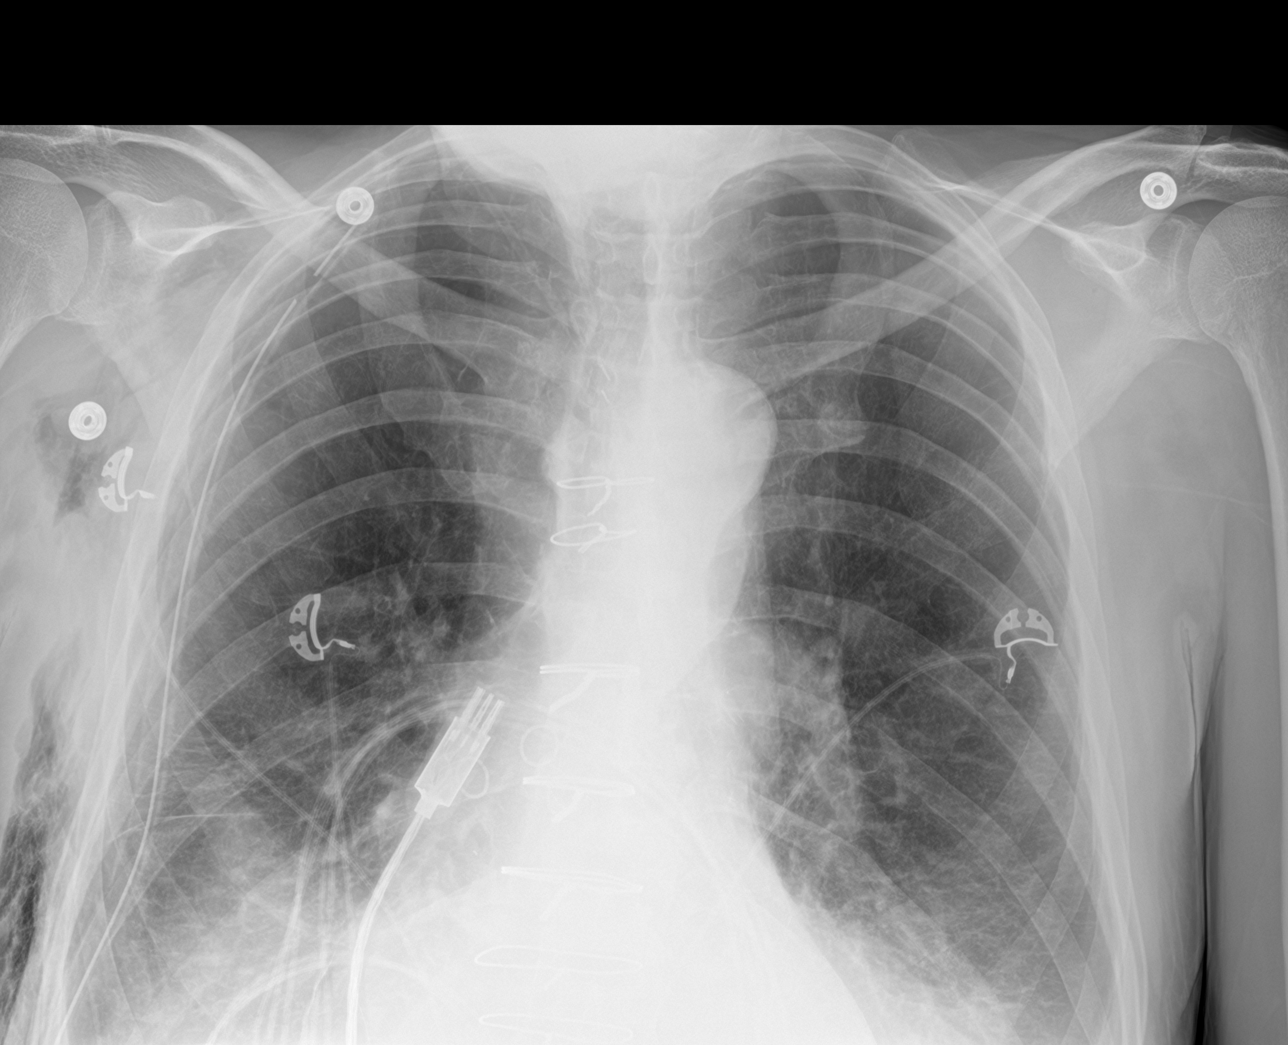

[chest ap (2 of 2)]
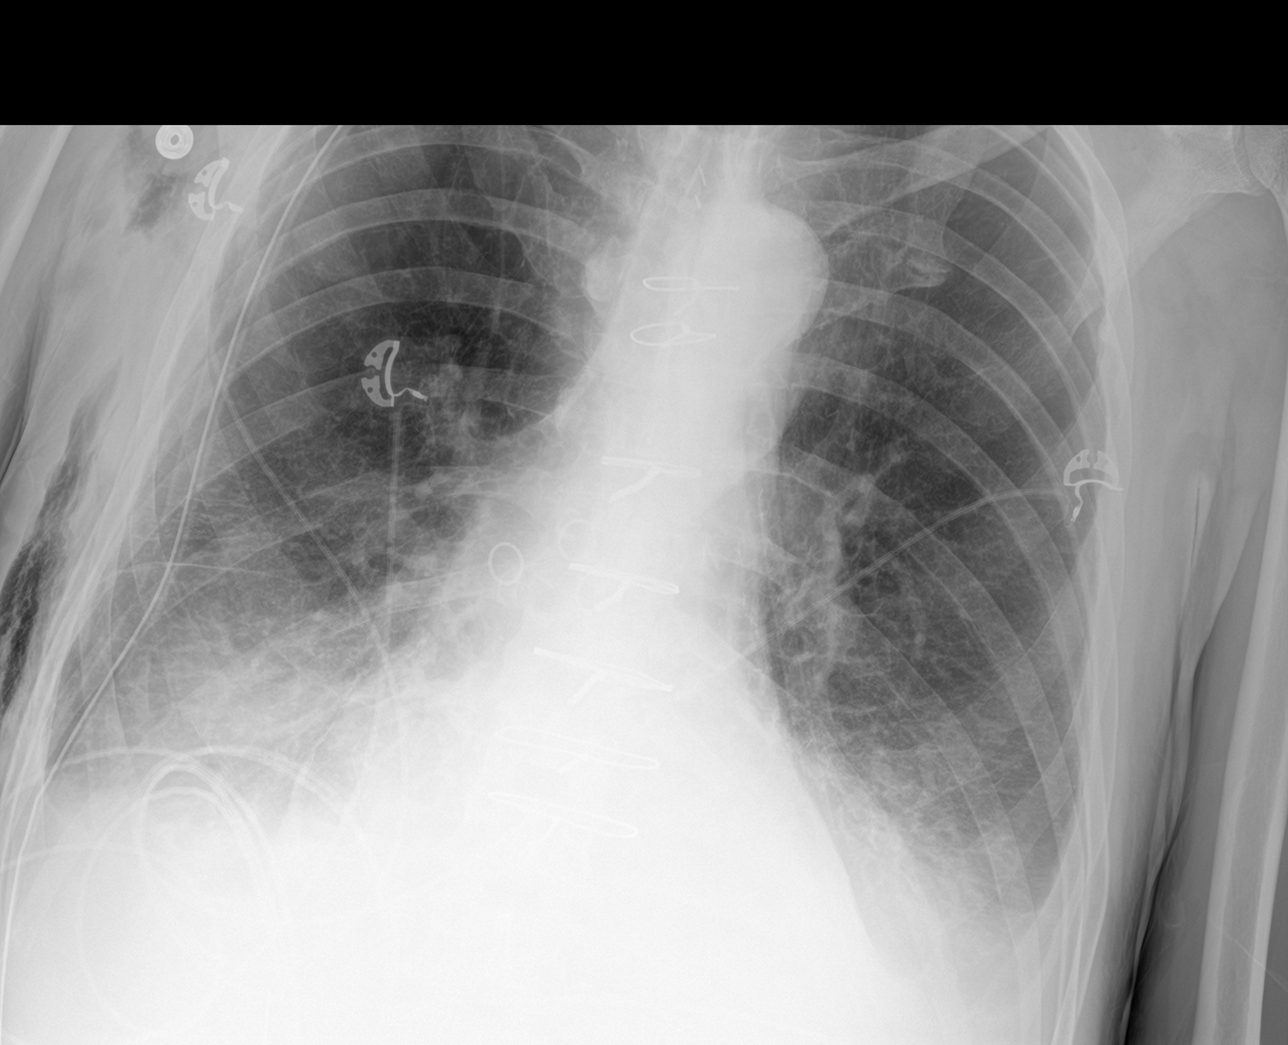

[3 of 3 positions shown; findings below may reference images not displayed]

FINDINGS: Lungs are hyperexpanded with atelectasis at the bases. Small pleural
effusions are evident. Right chest tube remains in place without
evidence for right-sided pneumothorax. Eight sheath overlies the
right thoracic inlet as before. The cardio pericardial silhouette is
enlarged. Bones are diffusely demineralized. Subcutaneous gas
identified in the right chest wall, slightly more prominent than
prior.
IMPRESSION: 1. Hyperexpansion with basilar atelectasis and small bilateral
pleural effusions.
2. No evidence for pneumothorax with right chest tube in place.

## 2017-11-22 IMAGING — DX DG CHEST 1V PORT
2 series · 2 of 2 positions shown · non-contrast
Comparison: Yesterday

CLINICAL DATA: Postop state.

EXAM:
PORTABLE CHEST 1 VIEW

[chest ap (1 of 2)]
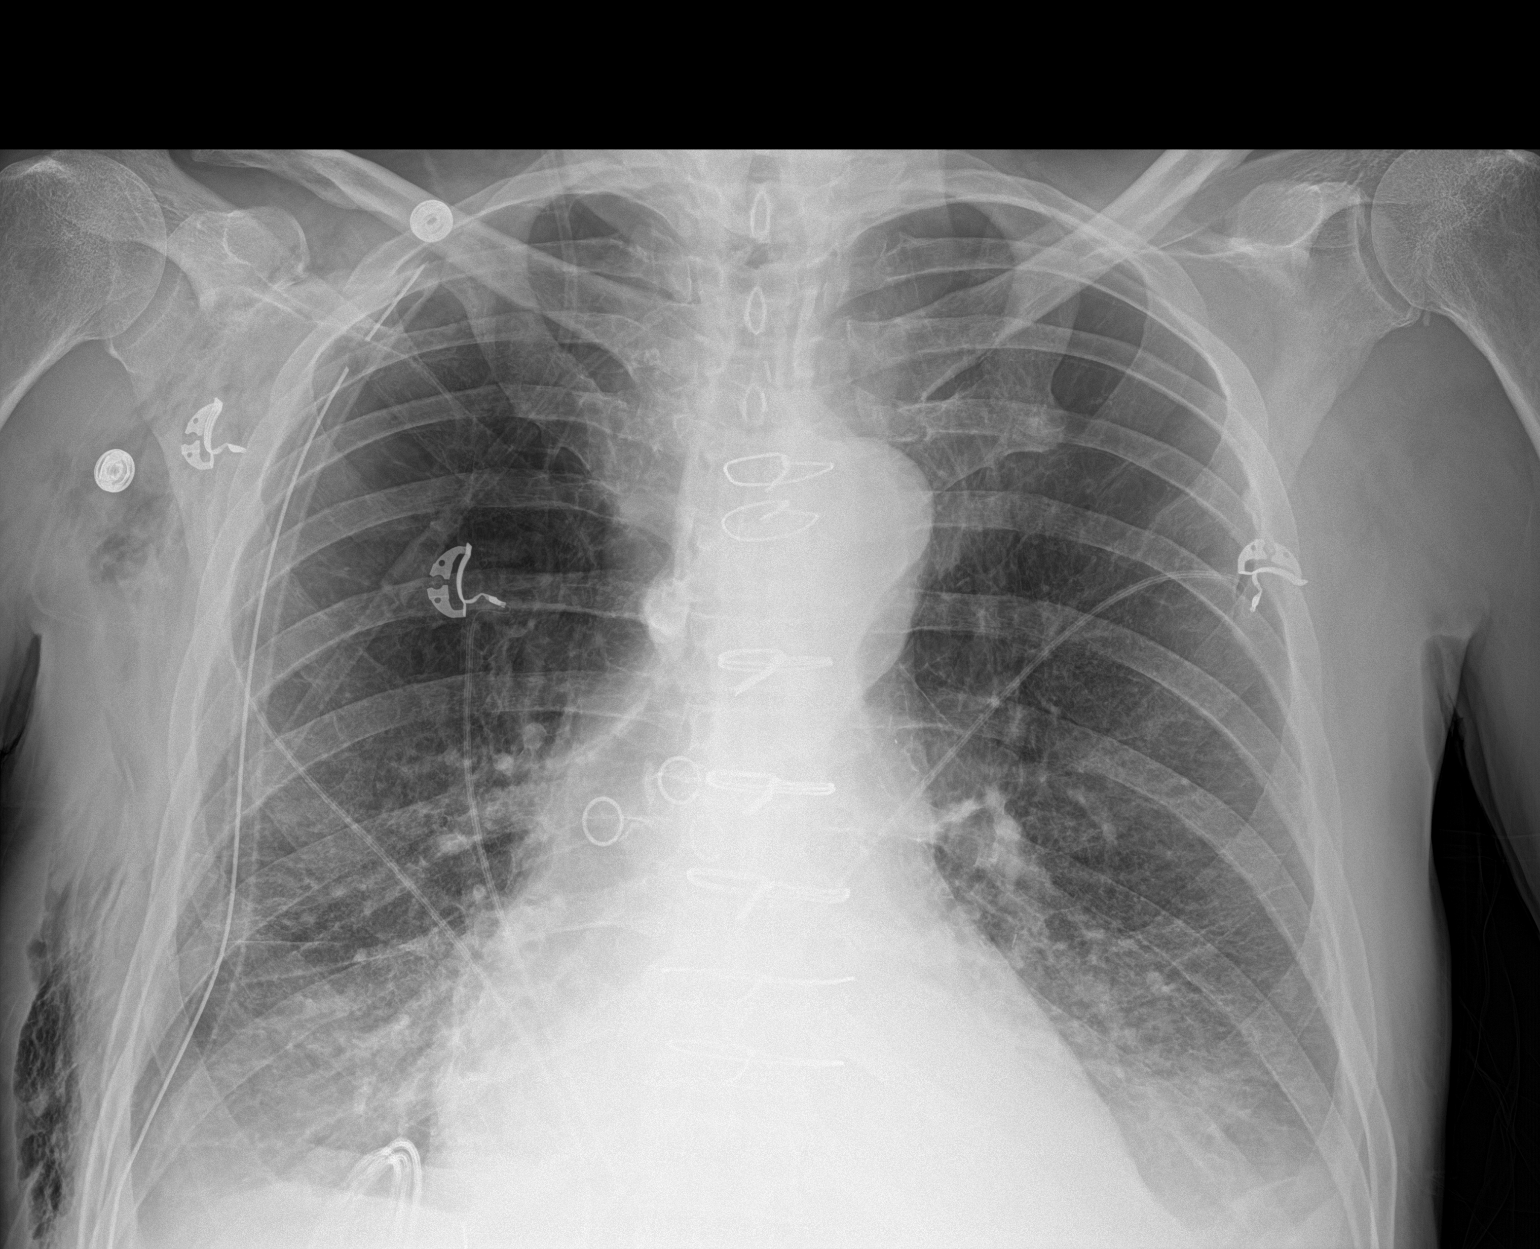

[chest ap (2 of 2)]
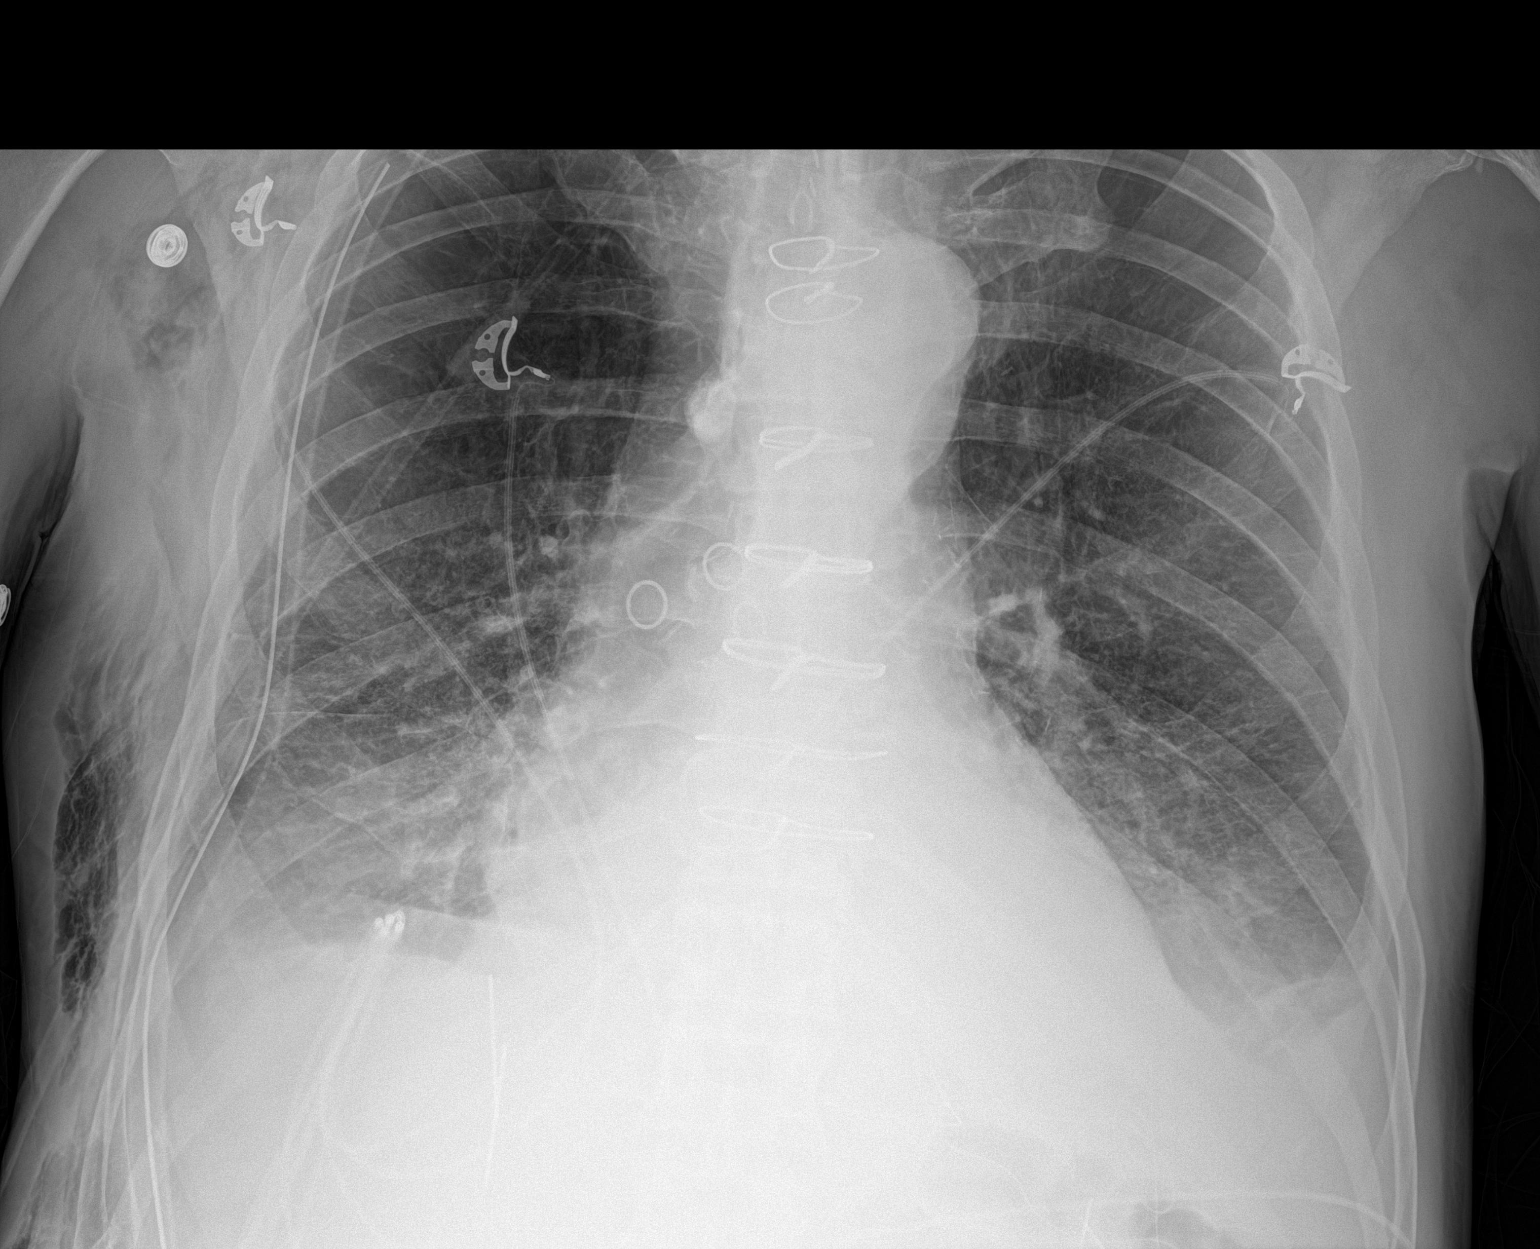

[2 of 2 positions shown; findings below may reference images not displayed]

FINDINGS: Chest tube on the right with tip at the apex. No visible
pneumothorax. There is haziness of the lower chest from pleural
effusions. Interstitial coarsening in the setting of emphysema and
possible edema. Mild cardiomegaly. Status post CABG. Stable chest
wall emphysema on the right.
IMPRESSION: 1. Chest tube in stable position with no visible pneumothorax.
2. Small pleural effusions. Interstitial coarsening from mild edema
or patient's emphysema.

## 2017-12-11 ENCOUNTER — Ambulatory Visit
Admission: RE | Admit: 2017-12-11 | Discharge: 2017-12-11 | Disposition: A | Discharge status: Home / Self Care | Payer: Medicare Other | Source: Ambulatory Visit | Attending: Unknown Physician Specialty | Admitting: Unknown Physician Specialty

## 2017-12-11 DIAGNOSIS — M50821 Other cervical disc disorders at C4-C5 level: Secondary | ICD-10-CM | POA: Insufficient documentation

## 2017-12-11 DIAGNOSIS — M4312 Spondylolisthesis, cervical region: Secondary | ICD-10-CM | POA: Insufficient documentation

## 2017-12-11 DIAGNOSIS — M542 Cervicalgia: Secondary | ICD-10-CM

## 2017-12-17 ENCOUNTER — Ambulatory Visit (INDEPENDENT_AMBULATORY_CARE_PROVIDER_SITE_OTHER): Payer: Medicare Other | Admitting: Unknown Physician Specialty

## 2017-12-17 ENCOUNTER — Encounter: Payer: Self-pay | Admitting: Unknown Physician Specialty

## 2017-12-17 DIAGNOSIS — N529 Male erectile dysfunction, unspecified: Secondary | ICD-10-CM | POA: Diagnosis not present

## 2017-12-17 DIAGNOSIS — I200 Unstable angina: Secondary | ICD-10-CM | POA: Diagnosis not present

## 2017-12-17 DIAGNOSIS — I10 Essential (primary) hypertension: Secondary | ICD-10-CM | POA: Diagnosis not present

## 2017-12-17 MED ORDER — AMLODIPINE BESYLATE 10 MG PO TABS: 10.0000 mg | ORAL_TABLET | Freq: Every day | ORAL | 3 refills | Status: DC

## 2017-12-17 MED ORDER — SILDENAFIL CITRATE 20 MG PO TABS: ORAL_TABLET | ORAL | 0 refills | Status: DC

## 2017-12-17 NOTE — Assessment & Plan Note (Signed)
Improved but not to goal of SBP below 130.  Increase Amlodipine to 10 mg.

## 2017-12-17 NOTE — Progress Notes (Signed)
Follow-up Outpatient Visit Date: 12/18/2017  Primary Care Provider: [name], NP [address] [place] [address]  Chief Complaint: Follow-up coronary artery disease  HPI:  Brendan [name] is a 66 y.o. year-old male with history of coronary artery disease status post CABG, hypertension, hyperlipidemia, diabetes mellitus, and tobacco use, who presents for follow-up of coronary artery disease. I last saw him in August, at which time he was gradually recovering from his CABG, albeit with some residual shortness of breath. He subsequently underwent thoracentesis and had no significant shortness of breath when he was seen in September by Eula Listen, PA. No medication changes were made at that time.  Today, Brendan [name] reports that he is continuing to gradually feel better. He still notes some fatigue but denies shortness of breath, chest pain, palpitations, lightheadedness, and edema. He was previously on lisinopril but stopped it because it was making him feel very tired. This has improved with discontinuation.  --------------------------------------------------------------------------------------------------  Cardiovascular History & Procedures: Cardiovascular Problems:  Coronary artery disease status post CABG  Risk Factors:  Hypertension, hyperlipidemia, diabetes mellitus, tobacco use, male gender, age greater than 41  Cath/PCI:  LHC (06/04/17): LMCA with 20-30% disease. LAD with 50% proximal and 90% mid vessel stenosis. Moderate-caliber D1 with 50% proximal disease. Moderate-Cal 2 with 80% ostial disease. LCx with sequential 90 and 70% ostial stenoses involving moderate-caliber OM2. Large, dominant RCA with diffuse proximal and mid vessel disease with sequential 80 and 90% stenoses. PDA with mild diffuse disease. RPL1 with 50% proximal stenosis. LVEF 50-55% with upper normal LVEDP.  CV Surgery:  CABG (07/02/17, Dr. Donata Clay): LIMA to LAD, SVG to D2, SVG to OM, and SVG to PDA.  EP  Procedures and Devices:  None  Non-Invasive Evaluation(s):  TTE (06/28/16, [facility]): Normal LV size and function (EF 55%). Grade 1 diastolic dysfunction. Normal right ventricular function. Mild tricuspid and mitral regurgitation. No valvular stenosis.  Pharmacologic myocardial perfusion stress test (06/28/16, [facility]):Homogeneous tracer uptake without ischemia or scar. LVEF 58%.  Recent CV Pertinent Labs: Lab Results  Component Value Date   CHOL 112 08/20/2017   CHOL 111 02/29/2016   HDL 47 08/20/2017   LDLCALC 50 08/20/2017   TRIG 75 08/20/2017   TRIG 73 02/29/2016   INR 1.10 06/18/2017   K 3.9 09/18/2017   MG 2.3 07/02/2017   BUN 16 09/18/2017   CREATININE 1.04 09/18/2017    Past medical and surgical history were reviewed and updated in EPIC.  Current Meds  Medication Sig  . acetaminophen (TYLENOL) 500 MG tablet Take 1 tablet (500 mg total) by mouth every 6 (six) hours as needed for mild pain or headache.  . albuterol (PROVENTIL HFA;VENTOLIN HFA) 108 (90 Base) MCG/ACT inhaler Inhale 2 puffs into the lungs every 6 (six) hours as needed for wheezing or shortness of breath.  [name] amLODipine (NORVASC) 10 MG tablet Take 1 tablet (10 mg total) by mouth daily.  [name] aspirin EC 325 MG EC tablet Take 1 tablet (325 mg total) by mouth daily.  [name] atorvastatin (LIPITOR) 20 MG tablet Take 1 tablet (20 mg total) by mouth daily.  [name] azelastine (ASTELIN) 0.1 % nasal spray Place 2 sprays into both nostrils at bedtime.   . furosemide (LASIX) 20 MG tablet Take 20 mg by mouth daily.  [name] guaiFENesin (MUCINEX) 600 MG 12 hr tablet Take 1 tablet (600 mg total) by mouth 2 (two) times daily as needed for cough or to loosen phlegm.  . metoprolol tartrate (LOPRESSOR) 25  MG tablet Take 0.5 tablets (12.5 mg total) by mouth 2 (two) times daily.  . montelukast (SINGULAIR) 10 MG tablet Take 1 tablet (10 mg total) by mouth at bedtime.  . sildenafil (REVATIO) 20 MG tablet Take 1-5 tablets prn  .  tamsulosin (FLOMAX) 0.4 MG CAPS capsule Take 1 capsule (0.4 mg total) by mouth daily. (Patient taking differently: Take 0.4 mg by mouth daily after supper. )    Allergies: Lisinopril and No known allergies  Social History   Socioeconomic History  . Marital status: Divorced    Spouse name: Not on file  . Number of children: Not on file  . Years of education: Not on file  . Highest education level: Not on file  Social Needs  . Financial resource strain: Not on file  . Food insecurity - worry: Not on file  . Food insecurity - inability: Not on file  . Transportation needs - medical: Not on file  . Transportation needs - non-medical: Not on file  Occupational History  . Not on file  Tobacco Use  . Smoking status: Former Smoker    Packs/day: 1.00    Years: 60.00    Pack years: 60.00    Types: Cigarettes    Last attempt to quit: 06/29/2017    Years since quitting: 0.4  . Smokeless tobacco: Never Used  Substance and Sexual Activity  . Alcohol use: No    Comment: previously drank heavily but quit ~ 20 yrs ago.  . Drug use: No  . Sexual activity: Yes  Other Topics Concern  . Not on file  Social History Narrative   Lives in [place] by himself.  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  Does not routinely exercise.    Family History  Problem Relation Age of Onset  . Hypertension Mother   . Diabetes Mother   . Heart disease Father 8670       multiple MI's    Review of Systems: A 12-system review of systems was performed and was negative except as noted in the HPI.  --------------------------------------------------------------------------------------------------  Physical Exam: BP (!) 166/96 (BP Location: Left Arm, Patient Position: Sitting, Cuff Size: Normal)   Pulse 63   Ht 6\' 3"  (1.905 m)   Wt 210 lb (95.3 kg)   BMI 26.25 kg/m   General:  Overweight man, seated comfortably in the exam room. HEENT: No conjunctival pallor or scleral icterus. Moist mucous membranes.  OP clear. Neck: Supple  without lymphadenopathy, thyromegaly, JVD, or HJR.  Lungs: Normal work of breathing. Clear to auscultation bilaterally without wheezes or crackles. Heart: Regular rate and rhythm without murmurs, rubs, or gallops. Non-displaced PMI. Abd: Bowel sounds present. Soft, NT/ND without hepatosplenomegaly Ext: No lower extremity edema. Radial, PT, and DP pulses are 2+ bilaterally. Skin: Warm and dry without rash. Median sternotomy incision is well-healed.  EKG:  NSR without significant abnormalities.  Lab Results  Component Value Date   WBC WILL FOLLOW 08/20/2017   HGB WILL FOLLOW 08/20/2017   HCT 42.1 08/20/2017   HCT WILL FOLLOW 08/20/2017   HCT 42.1 08/20/2017   MCV WILL FOLLOW 08/20/2017   PLT WILL FOLLOW 08/20/2017    Lab Results  Component Value Date   NA 143 09/18/2017   K 3.9 09/18/2017   CL 106 09/18/2017   CO2 23 09/18/2017   BUN 16 09/18/2017   CREATININE 1.04 09/18/2017   GLUCOSE 88 09/18/2017   ALT 11 08/20/2017    Lab Results  Component Value Date   CHOL 112 08/20/2017  HDL 47 08/20/2017   LDLCALC 50 08/20/2017   TRIG 75 08/20/2017    --------------------------------------------------------------------------------------------------  ASSESSMENT AND PLAN: Coronary artery disease without angina No further chest pain or shortness of breath. He still notes mild fatigue, though this continues to improve. We will continue current doses of metoprolol and atorvastatin. I have advised him to decrease aspirin from 325 mg daily to 81 mg daily when he has exhausted his current supply.  Hypertension Blood pressure is not well-controlled today. He was seen by his PCP yesterday, who increased amlodipine to 10 mg daily. I encouraged low sodium intake and compliance with his medications. I will defer further management to Ms. Wicker.  Hyperlipidemia Most recent LDL in August was 50. We will continue current dose of atorvastatin.  Perioperative atrial fibrillation No symptoms  to suggest recurrence. EKG today shows normal sinus rhythm. Amiodarone previously discontinued by Dr. Donata ClayVan Trigt.  No further workup at this time.  Follow-up: Return to clinic in 4 months.  Yvonne Kendallhristopher Stephanieann Popescu, MD 12/18/2017 2:11 PM

## 2017-12-17 NOTE — Assessment & Plan Note (Addendum)
Worsening problem.  Rx for Sildenafil generic.  Discussed off-label use

## 2017-12-17 NOTE — Progress Notes (Signed)
BP (!) 150/91 (BP Location: Left Arm, Cuff Size: Normal)   Pulse 65   Temp 98.5 F (36.9 C) (Oral)   Wt 206 lb 9.6 oz (93.7 kg)   SpO2 92%   BMI 25.82 kg/m    Subjective:    Patient ID: Brendan [name], male    DOB: August 25, 1951, 66 y.o.   MRN: [id number]  HPI: Brendan [name] is a 66 y.o. male  Chief Complaint  Patient presents with  . Hypertension    4 week f/up   Hypertension Using medications without difficulty.  Tolerating Amlodipine better than Lisinopril Average home BPs Not checking   No problems or lightheadedness No chest pain with exertion or shortness of breath No Edema  ED States the Amlodipine is helping compared to Lisinopril.  Not currently on any medications.  Viagra in the past.  Difficult with affordibility  Relevant past medical, surgical, family and social history reviewed and updated as indicated. Interim medical history since our last visit reviewed. Allergies and medications reviewed and updated.  Review of Systems  Constitutional: Negative.   Respiratory: Negative.   Cardiovascular: Negative.   Psychiatric/Behavioral: Negative.     Per HPI unless specifically indicated above     Objective:    BP (!) 150/91 (BP Location: Left Arm, Cuff Size: Normal)   Pulse 65   Temp 98.5 F (36.9 C) (Oral)   Wt 206 lb 9.6 oz (93.7 kg)   SpO2 92%   BMI 25.82 kg/m   Wt Readings from Last 3 Encounters:  12/17/17 206 lb 9.6 oz (93.7 kg)  11/20/17 202 lb 3.2 oz (91.7 kg)  09/25/17 190 lb (86.2 kg)    Physical Exam  Constitutional: He is oriented to person, place, and time. He appears well-developed and well-nourished. No distress.  HENT:  Head: Normocephalic and atraumatic.  Eyes: Conjunctivae and lids are normal. Right eye exhibits no discharge. Left eye exhibits no discharge. No scleral icterus.  Neck: Normal range of motion. Neck supple. No JVD present. Carotid bruit is not present.  Cardiovascular: Normal rate, regular rhythm and normal heart sounds.    Pulmonary/Chest: Effort normal and breath sounds normal. No respiratory distress.  Abdominal: Normal appearance. There is no splenomegaly or hepatomegaly.  Musculoskeletal: Normal range of motion.  Neurological: He is alert and oriented to person, place, and time.  Skin: Skin is warm, dry and intact. No rash noted. No pallor.  Psychiatric: He has a normal mood and affect. His behavior is normal. Judgment and thought content normal.    Results for orders placed or performed in visit on 09/18/17  Basic metabolic panel  Result Value Ref Range   Glucose 88 65 - 99 mg/dL   BUN 16 8 - 27 mg/dL   Creatinine, Ser [id number] 0.76 - 1.27 mg/dL   GFR calc non Af Amer 74 >59 mL/min/1.73   GFR calc Af Amer 86 >59 mL/min/1.73   BUN/Creatinine Ratio 15 10 - 24   Sodium 143 134 - 144 mmol/L   Potassium 3.9 3.5 - 5.2 mmol/L   Chloride 106 96 - 106 mmol/L   CO2 23 20 - 29 mmol/L   Calcium 9.1 8.6 - 10.2 mg/dL      Assessment & Plan:   Problem List Items Addressed This Visit      Unprioritized   ED (erectile dysfunction)    Worsening problem.  Rx for Sildenafil generic.  Discussed off-label use      Hypertension    Improved but not to goal  of SBP below 130.  Increase Amlodipine to 10 mg.        Relevant Medications   sildenafil (REVATIO) 20 MG tablet   amLODipine (NORVASC) 10 MG tablet       Follow up plan: Return in about 4 weeks (around 01/14/2018).

## 2017-12-18 ENCOUNTER — Ambulatory Visit (INDEPENDENT_AMBULATORY_CARE_PROVIDER_SITE_OTHER): Payer: Medicare Other | Admitting: Internal Medicine

## 2017-12-18 VITALS — BP 166/96 | HR 63 | Ht 75.0 in | Wt 210.0 lb

## 2017-12-18 DIAGNOSIS — E785 Hyperlipidemia, unspecified: Secondary | ICD-10-CM

## 2017-12-18 DIAGNOSIS — I10 Essential (primary) hypertension: Secondary | ICD-10-CM

## 2017-12-18 DIAGNOSIS — I480 Paroxysmal atrial fibrillation: Secondary | ICD-10-CM

## 2017-12-18 DIAGNOSIS — I2510 Atherosclerotic heart disease of native coronary artery without angina pectoris: Secondary | ICD-10-CM | POA: Diagnosis not present

## 2017-12-18 NOTE — Patient Instructions (Signed)
Medication Instructions:  Your physician recommends that you continue on your current medications as directed. Please refer to the Current Medication list given to you today.   Labwork: none  Testing/Procedures: none  Follow-Up: Your physician wants you to follow-up in: 4 MONTHS WITH DR END.  You will receive a reminder letter in the mail two months in advance. If you don't receive a letter, please call our office to schedule the follow-up appointment.   If you need a refill on your cardiac medications before your next appointment, please call your pharmacy.    DASH Eating Plan DASH stands for "Dietary Approaches to Stop Hypertension." The DASH eating plan is a healthy eating plan that has been shown to reduce high blood pressure (hypertension). It may also reduce your risk for type 2 diabetes, heart disease, and stroke. The DASH eating plan may also help with weight loss. What are tips for following this plan? General guidelines  Avoid eating more than 2,300 mg (milligrams) of salt (sodium) a day. If you have hypertension, you may need to reduce your sodium intake to 1,500 mg a day.  Limit alcohol intake to no more than 1 drink a day for nonpregnant women and 2 drinks a day for men. One drink equals 12 oz of beer, 5 oz of wine, or 1 oz of hard liquor.  Work with your health care provider to maintain a healthy body weight or to lose weight. Ask what an ideal weight is for you.  Get at least 30 minutes of exercise that causes your heart to beat faster (aerobic exercise) most days of the week. Activities may include walking, swimming, or biking.  Work with your health care provider or diet and nutrition specialist (dietitian) to adjust your eating plan to your individual calorie needs. Reading food labels  Check food labels for the amount of sodium per serving. Choose foods with less than 5 percent of the Daily Value of sodium. Generally, foods with less than 300 mg of sodium per  serving fit into this eating plan.  To find whole grains, look for the word "whole" as the first word in the ingredient list. Shopping  Buy products labeled as "low-sodium" or "no salt added."  Buy fresh foods. Avoid canned foods and premade or frozen meals. Cooking  Avoid adding salt when cooking. Use salt-free seasonings or herbs instead of table salt or sea salt. Check with your health care provider or pharmacist before using salt substitutes.  Do not fry foods. Cook foods using healthy methods such as baking, boiling, grilling, and broiling instead.  Cook with heart-healthy oils, such as olive, canola, soybean, or sunflower oil. Meal planning   Eat a balanced diet that includes: ? 5 or more servings of fruits and vegetables each day. At each meal, try to fill half of your plate with fruits and vegetables. ? Up to 6-8 servings of whole grains each day. ? Less than 6 oz of lean meat, poultry, or fish each day. A 3-oz serving of meat is about the same size as a deck of cards. One egg equals 1 oz. ? 2 servings of low-fat dairy each day. ? A serving of nuts, seeds, or beans 5 times each week. ? Heart-healthy fats. Healthy fats called Omega-3 fatty acids are found in foods such as flaxseeds and coldwater fish, like sardines, salmon, and mackerel.  Limit how much you eat of the following: ? Canned or prepackaged foods. ? Food that is high in trans fat, such as  fried foods. ? Food that is high in saturated fat, such as fatty meat. ? Sweets, desserts, sugary drinks, and other foods with added sugar. ? Full-fat dairy products.  Do not salt foods before eating.  Try to eat at least 2 vegetarian meals each week.  Eat more home-cooked food and less restaurant, buffet, and fast food.  When eating at a restaurant, ask that your food be prepared with less salt or no salt, if possible. What foods are recommended? The items listed may not be a complete list. Talk with your dietitian about  what dietary choices are best for you. Grains Whole-grain or whole-wheat bread. Whole-grain or whole-wheat pasta. Brown rice. [place]. Bulgur. Whole-grain and low-sodium cereals. Pita bread. Low-fat, low-sodium crackers. Whole-wheat flour tortillas. Vegetables Fresh or frozen vegetables (raw, steamed, roasted, or grilled). Low-sodium or reduced-sodium tomato and vegetable juice. Low-sodium or reduced-sodium tomato sauce and tomato paste. Low-sodium or reduced-sodium canned vegetables. Fruits All fresh, dried, or frozen fruit. Canned fruit in natural juice (without added sugar). Meat and other protein foods Skinless chicken or [place]. Ground chicken or [place]. Pork with fat trimmed off. Fish and seafood. Egg whites. Dried beans, peas, or lentils. Unsalted nuts, nut butters, and seeds. Unsalted canned beans. Lean cuts of beef with fat trimmed off. Low-sodium, lean deli meat. Dairy Low-fat (1%) or fat-free (skim) milk. Fat-free, low-fat, or reduced-fat cheeses. Nonfat, low-sodium ricotta or cottage cheese. Low-fat or nonfat yogurt. Low-fat, low-sodium cheese. Fats and oils Soft margarine without trans fats. Vegetable oil. Low-fat, reduced-fat, or light mayonnaise and salad dressings (reduced-sodium). Canola, safflower, olive, soybean, and sunflower oils. Avocado. Seasoning and other foods Herbs. Spices. Seasoning mixes without salt. Unsalted popcorn and pretzels. Fat-free sweets. What foods are not recommended? The items listed may not be a complete list. Talk with your dietitian about what dietary choices are best for you. Grains Baked goods made with fat, such as croissants, muffins, or some breads. Dry pasta or rice meal packs. Vegetables Creamed or fried vegetables. Vegetables in a cheese sauce. Regular canned vegetables (not low-sodium or reduced-sodium). Regular canned tomato sauce and paste (not low-sodium or reduced-sodium). Regular tomato and vegetable juice (not low-sodium or  reduced-sodium). Angie Fava. Olives. Fruits Canned fruit in a light or heavy syrup. Fried fruit. Fruit in cream or butter sauce. Meat and other protein foods Fatty cuts of meat. Ribs. Fried meat. Berniece Salines. Sausage. Bologna and other processed lunch meats. Salami. Fatback. Hotdogs. Bratwurst. Salted nuts and seeds. Canned beans with added salt. Canned or smoked fish. Whole eggs or egg yolks. Chicken or [place] with skin. Dairy Whole or 2% milk, cream, and half-and-half. Whole or full-fat cream cheese. Whole-fat or sweetened yogurt. Full-fat cheese. Nondairy creamers. Whipped toppings. Processed cheese and cheese spreads. Fats and oils Butter. Stick margarine. Lard. Shortening. Ghee. Bacon fat. Tropical oils, such as coconut, palm kernel, or palm oil. Seasoning and other foods Salted popcorn and pretzels. Onion salt, garlic salt, seasoned salt, table salt, and sea salt. Worcestershire sauce. Tartar sauce. Barbecue sauce. Teriyaki sauce. Soy sauce, including reduced-sodium. Steak sauce. Canned and packaged gravies. Fish sauce. Oyster sauce. Cocktail sauce. Horseradish that you find on the shelf. Ketchup. Mustard. Meat flavorings and tenderizers. Bouillon cubes. Hot sauce and Tabasco sauce. Premade or packaged marinades. Premade or packaged taco seasonings. Relishes. Regular salad dressings. Where to find more information:  National Heart, Lung, and [place]: https://wilson-eaton.com/  American Heart Association: www.heart.org Summary  The DASH eating plan is a healthy eating plan that has been shown to  reduce high blood pressure (hypertension). It may also reduce your risk for type 2 diabetes, heart disease, and stroke.  With the DASH eating plan, you should limit salt (sodium) intake to 2,300 mg a day. If you have hypertension, you may need to reduce your sodium intake to 1,500 mg a day.  When on the DASH eating plan, aim to eat more fresh fruits and vegetables, whole grains, lean proteins, low-fat  dairy, and heart-healthy fats.  Work with your health care provider or diet and nutrition specialist (dietitian) to adjust your eating plan to your individual calorie needs. This information is not intended to replace advice given to you by your health care provider. Make sure you discuss any questions you have with your health care provider. Document Released: 12/06/2011 Document Revised: 12/10/2016 Document Reviewed: 12/10/2016 Elsevier Interactive Patient Education  [employer]2018 [employer] Inc.

## 2017-12-19 ENCOUNTER — Encounter: Payer: Self-pay | Admitting: Internal Medicine

## 2017-12-19 MED ORDER — ASPIRIN EC 81 MG PO TBEC: 81.0000 mg | DELAYED_RELEASE_TABLET | Freq: Every day | ORAL | Status: AC

## 2017-12-19 NOTE — Addendum Note (Signed)
Addended by: Praneel Haisley A on: 12/19/2017 01:19 PM   Modules accepted: Orders

## 2018-01-14 ENCOUNTER — Encounter: Payer: Self-pay | Admitting: Unknown Physician Specialty

## 2018-01-14 ENCOUNTER — Ambulatory Visit (INDEPENDENT_AMBULATORY_CARE_PROVIDER_SITE_OTHER): Payer: Medicare Other | Admitting: Unknown Physician Specialty

## 2018-01-14 VITALS — BP 119/69 | HR 63 | Temp 98.3°F | Wt 210.8 lb

## 2018-01-14 DIAGNOSIS — E1165 Type 2 diabetes mellitus with hyperglycemia: Secondary | ICD-10-CM | POA: Diagnosis not present

## 2018-01-14 DIAGNOSIS — I10 Essential (primary) hypertension: Secondary | ICD-10-CM | POA: Diagnosis not present

## 2018-01-14 MED ORDER — METFORMIN HCL 500 MG PO TABS
500.0000 mg | ORAL_TABLET | Freq: Two times a day (BID) | ORAL | 5 refills | Status: DC
Start: 2018-01-14 — End: 2018-03-12

## 2018-01-14 NOTE — Progress Notes (Signed)
BP 119/69 (BP Location: Left Arm, Cuff Size: Normal)   Pulse 63   Temp 98.3 F (36.8 C) (Oral)   Wt 210 lb 12.8 oz (95.6 kg)   SpO2 96%   BMI 26.35 kg/m    Subjective:    Patient ID: Brendan [name], male    DOB: 1951-11-20, 67 y.o.   MRN: [id number]  HPI: Brendan [name] is a 67 y.o. male  Chief Complaint  Patient presents with  . Hypertension    4 week f/up   Hypertension Last visit increased Amlodipine to 10 mg. Using medications without difficulty Average home BPs not checking but 1 month ago high at cardiology on note review.  Decision on BP meds deferred to me.  No problems or lightheadedness No chest pain with exertion or shortness of breath No Edema  Diabetes Problem was resolved But, he is noticing his blood sugar is frequently elevated as high as 200.  States he controls it by not eating and moving around.    Relevant past medical, surgical, family and social history reviewed and updated as indicated. Interim medical history since our last visit reviewed. Allergies and medications reviewed and updated.  Review of Systems  Per HPI unless specifically indicated above     Objective:    BP 119/69 (BP Location: Left Arm, Cuff Size: Normal)   Pulse 63   Temp 98.3 F (36.8 C) (Oral)   Wt 210 lb 12.8 oz (95.6 kg)   SpO2 96%   BMI 26.35 kg/m   Wt Readings from Last 3 Encounters:  01/14/18 210 lb 12.8 oz (95.6 kg)  12/18/17 210 lb (95.3 kg)  12/17/17 206 lb 9.6 oz (93.7 kg)    Physical Exam  Constitutional: He is oriented to person, place, and time. He appears well-developed and well-nourished. No distress.  HENT:  Head: Normocephalic and atraumatic.  Eyes: Conjunctivae and lids are normal. Right eye exhibits no discharge. Left eye exhibits no discharge. No scleral icterus.  Neck: Normal range of motion. Neck supple. No JVD present. Carotid bruit is not present.  Cardiovascular: Normal rate, regular rhythm and normal heart sounds.  Pulmonary/Chest: Effort  normal and breath sounds normal. No respiratory distress.  Abdominal: Normal appearance. There is no splenomegaly or hepatomegaly.  Musculoskeletal: Normal range of motion.  Neurological: He is alert and oriented to person, place, and time.  Skin: Skin is warm, dry and intact. No rash noted. No pallor.  Psychiatric: He has a normal mood and affect. His behavior is normal. Judgment and thought content normal.    Results for orders placed or performed in visit on 09/18/17  Basic metabolic panel  Result Value Ref Range   Glucose 88 65 - 99 mg/dL   BUN 16 8 - 27 mg/dL   Creatinine, Ser 7.82 0.76 - 1.27 mg/dL   GFR calc non Af Amer 74 >59 mL/min/1.73   GFR calc Af Amer 86 >59 mL/min/1.73   BUN/Creatinine Ratio 15 10 - 24   Sodium 143 134 - 144 mmol/L   Potassium 3.9 3.5 - 5.2 mmol/L   Chloride 106 96 - 106 mmol/L   CO2 23 20 - 29 mmol/L   Calcium 9.1 8.6 - 10.2 mg/dL      Assessment & Plan:   Problem List Items Addressed This Visit      Unprioritized   Hypertension    Stable, continue present medications.        Relevant Medications   aspirin 325 MG tablet   Type 2  diabetes mellitus with hyperglycemia, without long-term current use of insulin (HCC) - Primary    Hgb A1Cis now 7.1, up from 5.!%  Pt admits to weight gain.  Restart Metformin      Relevant Medications   aspirin 325 MG tablet   metFORMIN (GLUCOPHAGE) 500 MG tablet   Other Relevant Orders   Comprehensive metabolic panel   Bayer DCA Hb [id number] Waived       Follow up plan: Return in about 3 months (around 04/14/2018).

## 2018-01-14 NOTE — Assessment & Plan Note (Signed)
Hgb A1Cis now 7.1, up from 5.!%  Pt admits to weight gain.  Restart Metformin

## 2018-01-14 NOTE — Assessment & Plan Note (Signed)
Stable, continue present medications.   

## 2018-01-15 LAB — COMPREHENSIVE METABOLIC PANEL
A/G RATIO: 2.4 — AB (ref 1.2–2.2)
ALBUMIN: 4.7 g/dL (ref 3.6–4.8)
ALT: 13 IU/L (ref 0–44)
AST: 13 IU/L (ref 0–40)
Alkaline Phosphatase: 88 IU/L (ref 39–117)
BILIRUBIN TOTAL: 0.3 mg/dL (ref 0.0–1.2)
BUN / CREAT RATIO: 15 (ref 10–24)
BUN: 16 mg/dL (ref 8–27)
CALCIUM: 9.6 mg/dL (ref 8.6–10.2)
CHLORIDE: 104 mmol/L (ref 96–106)
CO2: 24 mmol/L (ref 20–29)
Creatinine, Ser: 1.05 mg/dL (ref 0.76–1.27)
GFR, EST AFRICAN AMERICAN: 85 mL/min/{1.73_m2} (ref >59)
GFR, EST NON AFRICAN AMERICAN: 74 mL/min/{1.73_m2} (ref >59)
GLOBULIN, TOTAL: 2 g/dL (ref 1.5–4.5)
Glucose: 156 mg/dL — ABNORMAL HIGH (ref 65–99)
POTASSIUM: 4.8 mmol/L (ref 3.5–5.2)
SODIUM: 143 mmol/L (ref 134–144)
TOTAL PROTEIN: 6.7 g/dL (ref 6.0–8.5)

## 2018-01-15 LAB — BAYER DCA HB A1C WAIVED: HB A1C (BAYER DCA - WAIVED): 7.1 % — ABNORMAL HIGH (ref <7.0)

## 2018-03-12 ENCOUNTER — Telehealth: Payer: Self-pay

## 2018-03-12 MED ORDER — METFORMIN HCL 500 MG PO TABS: 500.0000 mg | ORAL_TABLET | Freq: Two times a day (BID) | ORAL | 1 refills | Status: DC

## 2018-03-12 NOTE — Telephone Encounter (Signed)
Received a fax from [facility] requesting that patient's metformin rx be changed to a 90 day RX due to cost effectiveness.

## 2018-03-12 NOTE — Telephone Encounter (Signed)
Rx changed to 90 days as requested

## 2018-03-20 ENCOUNTER — Ambulatory Visit (INDEPENDENT_AMBULATORY_CARE_PROVIDER_SITE_OTHER): Payer: Medicare Other

## 2018-03-20 VITALS — BP 152/80 | HR 62 | Temp 98.2°F | Resp 17 | Ht 75.0 in | Wt 217.4 lb

## 2018-03-20 DIAGNOSIS — Z0000 Encounter for general adult medical examination without abnormal findings: Secondary | ICD-10-CM | POA: Diagnosis not present

## 2018-03-20 NOTE — Progress Notes (Signed)
Subjective:   Brendan [name] is a 67 y.o. male who presents for Medicare Annual/Subsequent preventive examination.  Review of Systems:   Cardiac Risk Factors include: hypertension;advanced age (>7355men, 23>65 women);male gender;dyslipidemia;diabetes mellitus;smoking/ tobacco exposure     Objective:    Vitals: BP (!) 152/80 (BP Location: Left Arm, Patient Position: Sitting)   Pulse 62   Temp 98.2 F (36.8 C) (Temporal)   Resp 17   Ht 6\' 3"  (1.905 m)   Wt 217 lb 6.4 oz (98.6 kg)   BMI 27.17 kg/m   Body mass index is 27.17 kg/m.  Advanced Directives 03/20/2018 07/02/2017 06/18/2017 06/04/2017 03/20/2017 03/19/2017 05/28/2015  Does Patient Have a Medical Advance Directive? No No No No No No No  Would patient like information on creating a medical advance directive? Yes (MAU/Ambulatory/Procedural Areas - Information given) No - Patient declined - No - Patient declined Yes (MAU/Ambulatory/Procedural Areas - Information given) No - Patient declined No - patient declined information    Tobacco Social History   Tobacco Use  Smoking Status Former Smoker  . Packs/day: 1.00  . Years: 60.00  . Pack years: 60.00  . Types: Cigarettes  . Last attempt to quit: 06/29/2017  . Years since quitting: 0.7  Smokeless Tobacco Never Used     Counseling given: Not Answered   Clinical Intake:  Pre-visit preparation completed: Yes  Pain : No/denies pain     Nutritional Status: BMI 25 -29 Overweight Nutritional Risks: None Diabetes: Yes CBG done?: No Did pt. bring in CBG monitor from home?: No  How often do you need to have someone help you when you read instructions, pamphlets, or other written materials from your doctor or pharmacy?: 1 - Never What is the last grade level you completed in school?: 9th grade  Interpreter Needed?: No  Information entered by :: Tiffany Hill,LPN   Past Medical History:  Diagnosis Date  . Chest pain    a. 05/2016 Lexi MV: EF 58%, no ischemia/infarct.  . Coronary  artery disease   . Diabetes mellitus without complication (HCC)   . Diastolic dysfunction    a. 05/2016 Echo: EF 55%, Gr1 DD, mild TR/MR.  [name]. Dyspnea   . Hyperlipidemia   . Hypertension   . Personal history of tobacco use, presenting hazards to health 05/03/2016   Past Surgical History:  Procedure Laterality Date  . BACK SURGERY    . CORONARY ARTERY BYPASS GRAFT N/A 07/01/2017   Procedure: CORONARY ARTERY BYPASS GRAFTING (CABG) x4, using left mammary artery and right greater saphenous vein harvested endoscopically;  Surgeon: Kerin PernaVan Trigt, Peter, MD;  [facility];  Service: Open Heart Surgery;  Laterality: N/A;  . KNEE SURGERY    . LEFT HEART CATH AND CORONARY ANGIOGRAPHY N/A 06/04/2017   Procedure: Left Heart Cath and Coronary Angiography;  Surgeon: Yvonne KendallEnd, Christopher, MD;  Location: [facility] INVASIVE CV LAB;  Service: Cardiovascular;  Laterality: N/A;  . TEE WITHOUT CARDIOVERSION N/A 07/01/2017   Procedure: TRANSESOPHAGEAL ECHOCARDIOGRAM (TEE);  Surgeon: Donata ClayVan Trigt, Theron AristaPeter, MD;  [facility];  Service: Open Heart Surgery;  Laterality: N/A;   Family History  Problem Relation Age of Onset  . Hypertension Mother   . Diabetes Mother   . Heart disease Father 2970       multiple MI's   Social History   Socioeconomic History  . Marital status: Widowed    Spouse name: Not on file  . Number of children: Not on file  . Years of education: Not on file  .  Highest education level: Not on file  Occupational History  . Not on file  Social Needs  . Financial resource strain: Not hard at all  . Food insecurity:    Worry: Never true    Inability: Never true  . Transportation needs:    Medical: No    Non-medical: No  Tobacco Use  . Smoking status: Former Smoker    Packs/day: 1.00    Years: 60.00    Pack years: 60.00    Types: Cigarettes    Last attempt to quit: 06/29/2017    Years since quitting: 0.7  . Smokeless tobacco: Never Used  Substance and Sexual Activity  . Alcohol use: No    Comment:  previously drank heavily but quit ~ 20 yrs ago.  . Drug use: No  . Sexual activity: Yes  Lifestyle  . Physical activity:    Days per week: 0 days    Minutes per session: 0 min  . Stress: Not at all  Relationships  . Social connections:    Talks on phone: Never    Gets together: More than three times a week    Attends religious service: 1 to 4 times per year    Active member of club or organization: No    Attends meetings of clubs or organizations: Never    Relationship status: Widowed  Other Topics Concern  . Not on file  Social History Narrative   Lives in [place] by himself.  Works as Copy.  Does not routinely exercise.    Outpatient Encounter Medications as of 03/20/2018  Medication Sig  . acetaminophen (TYLENOL) 500 MG tablet Take 1 tablet (500 mg total) by mouth every 6 (six) hours as needed for mild pain or headache.  . albuterol (PROVENTIL HFA;VENTOLIN HFA) 108 (90 Base) MCG/ACT inhaler Inhale 2 puffs into the lungs every 6 (six) hours as needed for wheezing or shortness of breath.  [name] amLODipine (NORVASC) 10 MG tablet Take 1 tablet (10 mg total) by mouth daily.  [name] aspirin EC 81 MG tablet Take 1 tablet (81 mg total) by mouth daily.  [name] atorvastatin (LIPITOR) 20 MG tablet Take 1 tablet (20 mg total) by mouth daily.  [name] azelastine (ASTELIN) 0.1 % nasal spray Place 2 sprays into both nostrils at bedtime.   . furosemide (LASIX) 20 MG tablet Take 20 mg by mouth daily.  [name] guaiFENesin (MUCINEX) 600 MG 12 hr tablet Take 1 tablet (600 mg total) by mouth 2 (two) times daily as needed for cough or to loosen phlegm.  . metFORMIN (GLUCOPHAGE) 500 MG tablet Take 1 tablet (500 mg total) by mouth 2 (two) times daily with a meal.  . metoprolol tartrate (LOPRESSOR) 25 MG tablet Take 0.5 tablets (12.5 mg total) by mouth 2 (two) times daily.  . montelukast (SINGULAIR) 10 MG tablet Take 1 tablet (10 mg total) by mouth at bedtime.  . sildenafil (REVATIO) 20 MG tablet Take 1-5 tablets prn  .  tamsulosin (FLOMAX) 0.4 MG CAPS capsule Take 1 capsule (0.4 mg total) by mouth daily. (Patient taking differently: Take 0.4 mg by mouth daily after supper. )  . [DISCONTINUED] aspirin 325 MG tablet Take 325 mg by mouth daily.   No facility-administered encounter medications on file as of 03/20/2018.     Activities of Daily Living In your present state of health, do you have any difficulty performing the following activities: 03/20/2018 07/02/2017  Hearing? Y N  Vision? N N  Difficulty concentrating or making decisions? N N  Walking or climbing stairs? Y Y  Dressing or bathing? N N  Doing errands, shopping? N N  Preparing Food and eating ? N -  Using the Toilet? N -  In the past six months, have you accidently leaked urine? N -  Do you have problems with loss of bowel control? N -  Managing your Medications? N -  Managing your Finances? N -  Housekeeping or managing your Housekeeping? N -  Some recent data might be hidden    Patient Care Team: [name], NP as PCP - General (Nurse Practitioner) End, Cristal Deer, MD as Consulting Physician (Cardiology) Donata Clay, Theron Arista, MD as Consulting Physician (Cardiothoracic Surgery)   Assessment:   This is a routine wellness examination for Brendan [name].  Exercise Activities and Dietary recommendations Current Exercise Habits: The patient has a physically strenous job, but has no regular exercise apart from work., Exercise limited by: None identified  Goals    . DIET - INCREASE WATER INTAKE     Recommend drinking at least 6-8 glasses of water a day        Fall Risk Fall Risk  03/20/2018 03/19/2017 06/05/2016  Falls in the past year? No No No   Is the patient's home free of loose throw rugs in walkways, pet beds, electrical cords, etc?   yes      Grab bars in the bathroom? no      Handrails on the stairs?   no stairs       Adequate lighting?   yes  Timed Get Up and Go Performed: Completed in 8 seconds with no use of assistive devices, steady  gait. No intervention needed at this time.   Depression Screen PHQ 2/9 Scores 03/20/2018 03/19/2017 06/05/2016  PHQ - 2 Score 0 0 0  PHQ- 9 Score - 1 -    Cognitive Function     6CIT Screen 03/20/2018  What Year? 0 points  What month? 0 points  What time? 0 points  Count back from 20 0 points  Months in reverse 0 points  Repeat phrase 2 points  Total Score 2    Immunization History  Administered Date(s) Administered  . Influenza, High Dose Seasonal PF 09/17/2016, 11/20/2017  . Influenza,inj,Quad PF,6+ Mos 11/30/2015  . Influenza-Unspecified 11/11/2013, 11/30/2015  . Pneumococcal Conjugate-13 05/10/2014  . Pneumococcal Polysaccharide-23 09/15/2007, 03/19/2017  . Td 08/16/2005  . Zoster 07/31/2011    Qualifies for Shingles Vaccine? Yes, discussed shingrix vaccine   Screening Tests Health Maintenance  Topic Date Due  . OPHTHALMOLOGY EXAM  06/26/2017  . FOOT EXAM  03/19/2018  . URINE MICROALBUMIN  03/19/2018  . TETANUS/TDAP  11/20/2018 (Originally 08/17/2015)  . HEMOGLOBIN A1C  07/14/2018  . COLONOSCOPY  04/15/2024  . INFLUENZA VACCINE  Completed  . Hepatitis C Screening  Completed  . PNA vac Low Risk Adult  Completed   Cancer Screenings: Lung: Low Dose CT Chest recommended if Age 57-80 years, 30 pack-year currently smoking OR have quit w/in 15years. Patient does qualify. Screening due in may 2019 Colorectal: completed 04/15/2014  Additional Screenings:  Hepatitis B/HIV/Syphillis:not indicated  Hepatitis C Screening: completed 11/30/2015    Plan:    I have personally reviewed and addressed the Medicare Annual Wellness questionnaire and have noted the following in the patient's chart:  A. Medical and social history B. Use of alcohol, tobacco or illicit drugs  C. Current medications and supplements D. Functional ability and status E.  Nutritional status F.  Physical activity G. Advance directives H.  List of other physicians I.  Hospitalizations, surgeries, and ER  visits in previous 12 months J.  Vitals K. Screenings such as hearing and vision if needed, cognitive and depression L. Referrals and appointments   In addition, I have reviewed and discussed with patient certain preventive protocols, quality metrics, and best practice recommendations. A written personalized care plan for preventive services as well as general preventive health recommendations were provided to patient.   Signed,  Marin Roberts, LPN Nurse Health Advisor   Nurse Notes: due for diabetic foot exam. Office visit schedule with Phineas Inches on 04/14/2018.  Requesting refill on Astelin spray.

## 2018-03-20 NOTE — Patient Instructions (Addendum)
[name] , Thank you for taking time to come for your Medicare Wellness Visit. I appreciate your ongoing commitment to your health goals. Please review the following plan we discussed and let me know if I can assist you in the future.   Screening recommendations/referrals: Colonoscopy: completed 04/15/2014 Recommended yearly ophthalmology/optometry visit for glaucoma screening and checkup Recommended yearly dental visit for hygiene and checkup  Vaccinations: Influenza vaccine: up to date Pneumococcal vaccine: up to date Tdap vaccine: due, check with your insurance company for coverage  Shingles vaccine: eligible, check with your insurance company for coverage  Advanced directives: Advance directive discussed with you today. I have provided a copy for you to complete at home and have notarized. Once this is complete please bring a copy in to our office so we can scan it into your chart.  Conditions/risks identified: Recommend drinking at least 6-8 glasses of water a day   Next appointment: Follow up on 04/14/2018 at 1:30pm  with Brendan Inchesheryl Wicker,NP. Follow up in one year for your annual wellness exam.   Preventive Care 65 Years and Older, Male Preventive care refers to lifestyle choices and visits with your health care provider that can promote health and wellness. What does preventive care include?  A yearly physical exam. This is also called an annual well check.  Dental exams once or twice a year.  Routine eye exams. Ask your health care provider how often you should have your eyes checked.  Personal lifestyle choices, including:  Daily care of your teeth and gums.  Regular physical activity.  Eating a healthy diet.  Avoiding tobacco and drug use.  Limiting alcohol use.  Practicing safe sex.  Taking low doses of aspirin every day.  Taking vitamin and mineral supplements as recommended by your health care provider. What happens during an annual well check? The services  and screenings done by your health care provider during your annual well check will depend on your age, overall health, lifestyle risk factors, and family history of disease. Counseling  Your health care provider may ask you questions about your:  Alcohol use.  Tobacco use.  Drug use.  Emotional well-being.  Home and relationship well-being.  Sexual activity.  Eating habits.  History of falls.  Memory and ability to understand (cognition).  Work and work Astronomerenvironment. Screening  You may have the following tests or measurements:  Height, weight, and BMI.  Blood pressure.  Lipid and cholesterol levels. These may be checked every 5 years, or more frequently if you are over 67 years old.  Skin check.  Lung cancer screening. You may have this screening every year starting at age 755 if you have a 30-pack-year history of smoking and currently smoke or have quit within the past 15 years.  Fecal occult blood test (FOBT) of the stool. You may have this test every year starting at age 67.  Flexible sigmoidoscopy or colonoscopy. You may have a sigmoidoscopy every 5 years or a colonoscopy every 10 years starting at age 67.  Prostate cancer screening. Recommendations will vary depending on your family history and other risks.  Hepatitis C blood test.  Hepatitis B blood test.  Sexually transmitted disease (STD) testing.  Diabetes screening. This is done by checking your blood sugar (glucose) after you have not eaten for a while (fasting). You may have this done every 1-3 years.  Abdominal aortic aneurysm (AAA) screening. You may need this if you are a current or former smoker.  Osteoporosis. You may be  screened starting at age 26 if you are at high risk. Talk with your health care provider about your test results, treatment options, and if necessary, the need for more tests. Vaccines  Your health care provider may recommend certain vaccines, such as:  Influenza vaccine. This  is recommended every year.  Tetanus, diphtheria, and acellular pertussis (Tdap, Td) vaccine. You may need a Td booster every 10 years.  Zoster vaccine. You may need this after age 25.  Pneumococcal 13-valent conjugate (PCV13) vaccine. One dose is recommended after age 66.  Pneumococcal polysaccharide (PPSV23) vaccine. One dose is recommended after age 56. Talk to your health care provider about which screenings and vaccines you need and how often you need them. This information is not intended to replace advice given to you by your health care provider. Make sure you discuss any questions you have with your health care provider. Document Released: 01/13/2016 Document Revised: 09/05/2016 Document Reviewed: 10/18/2015 Elsevier Interactive Patient Education  2017 [place] Prevention in the Home Falls can cause injuries. They can happen to people of all ages. There are many things you can do to make your home safe and to help prevent falls. What can I do on the outside of my home?  Regularly fix the edges of walkways and driveways and fix any cracks.  Remove anything that might make you trip as you walk through a door, such as a raised step or threshold.  Trim any bushes or trees on the path to your home.  Use bright outdoor lighting.  Clear any walking paths of anything that might make someone trip, such as rocks or tools.  Regularly check to see if handrails are loose or broken. Make sure that both sides of any steps have handrails.  Any raised decks and porches should have guardrails on the edges.  Have any leaves, snow, or ice cleared regularly.  Use sand or salt on walking paths during winter.  Clean up any spills in your garage right away. This includes oil or grease spills. What can I do in the bathroom?  Use night lights.  Install grab bars by the toilet and in the tub and shower. Do not use towel bars as grab bars.  Use non-skid mats or decals in the tub or  shower.  If you need to sit down in the shower, use a plastic, non-slip stool.  Keep the floor dry. Clean up any water that spills on the floor as soon as it happens.  Remove soap buildup in the tub or shower regularly.  Attach bath mats securely with double-sided non-slip rug tape.  Do not have throw rugs and other things on the floor that can make you trip. What can I do in the bedroom?  Use night lights.  Make sure that you have a light by your bed that is easy to reach.  Do not use any sheets or blankets that are too big for your bed. They should not hang down onto the floor.  Have a firm chair that has side arms. You can use this for support while you get dressed.  Do not have throw rugs and other things on the floor that can make you trip. What can I do in the kitchen?  Clean up any spills right away.  Avoid walking on wet floors.  Keep items that you use a lot in easy-to-reach places.  If you need to reach something above you, use a strong step stool that has a  grab bar.  Keep electrical cords out of the way.  Do not use floor polish or wax that makes floors slippery. If you must use wax, use non-skid floor wax.  Do not have throw rugs and other things on the floor that can make you trip. What can I do with my stairs?  Do not leave any items on the stairs.  Make sure that there are handrails on both sides of the stairs and use them. Fix handrails that are broken or loose. Make sure that handrails are as long as the stairways.  Check any carpeting to make sure that it is firmly attached to the stairs. Fix any carpet that is loose or worn.  Avoid having throw rugs at the top or bottom of the stairs. If you do have throw rugs, attach them to the floor with carpet tape.  Make sure that you have a light switch at the top of the stairs and the bottom of the stairs. If you do not have them, ask someone to add them for you. What else can I do to help prevent  falls?  Wear shoes that:  Do not have high heels.  Have rubber bottoms.  Are comfortable and fit you well.  Are closed at the toe. Do not wear sandals.  If you use a stepladder:  Make sure that it is fully opened. Do not climb a closed stepladder.  Make sure that both sides of the stepladder are locked into place.  Ask someone to hold it for you, if possible.  Clearly mark and make sure that you can see:  Any grab bars or handrails.  First and last steps.  Where the edge of each step is.  Use tools that help you move around (mobility aids) if they are needed. These include:  Canes.  Walkers.  Scooters.  Crutches.  Turn on the lights when you go into a dark area. Replace any light bulbs as soon as they burn out.  Set up your furniture so you have a clear path. Avoid moving your furniture around.  If any of your floors are uneven, fix them.  If there are any pets around you, be aware of where they are.  Review your medicines with your doctor. Some medicines can make you feel dizzy. This can increase your chance of falling. Ask your doctor what other things that you can do to help prevent falls. This information is not intended to replace advice given to you by your health care provider. Make sure you discuss any questions you have with your health care provider. Document Released: 10/13/2009 Document Revised: 05/24/2016 Document Reviewed: 01/21/2015 Elsevier Interactive Patient Education  2017 [employer].

## 2018-04-14 ENCOUNTER — Encounter: Payer: Self-pay | Admitting: Unknown Physician Specialty

## 2018-04-14 ENCOUNTER — Ambulatory Visit (INDEPENDENT_AMBULATORY_CARE_PROVIDER_SITE_OTHER): Payer: Medicare Other | Admitting: Unknown Physician Specialty

## 2018-04-14 VITALS — BP 133/89 | HR 64 | Temp 98.9°F | Ht 75.0 in | Wt 217.2 lb

## 2018-04-14 DIAGNOSIS — I10 Essential (primary) hypertension: Secondary | ICD-10-CM | POA: Diagnosis not present

## 2018-04-14 DIAGNOSIS — E1165 Type 2 diabetes mellitus with hyperglycemia: Secondary | ICD-10-CM | POA: Diagnosis not present

## 2018-04-14 DIAGNOSIS — E785 Hyperlipidemia, unspecified: Secondary | ICD-10-CM | POA: Diagnosis not present

## 2018-04-14 LAB — BAYER DCA HB A1C WAIVED: HB A1C: 6.7 % (ref <7.0)

## 2018-04-14 MED ORDER — AMLODIPINE BESYLATE 5 MG PO TABS: 5.0000 mg | ORAL_TABLET | Freq: Every day | ORAL | 3 refills | Status: DC

## 2018-04-14 MED ORDER — HYDROCHLOROTHIAZIDE 25 MG PO TABS: 25.0000 mg | ORAL_TABLET | Freq: Every day | ORAL | 0 refills | Status: DC

## 2018-04-14 NOTE — Assessment & Plan Note (Signed)
Not fasting today.  Check next visit

## 2018-04-14 NOTE — Assessment & Plan Note (Signed)
Stable but bothered by foot and leg swelling.  I suspect related to Amlodipine.  Cut back Amlodipine to 5 mg and add HCTZ 25 mg.

## 2018-04-14 NOTE — Assessment & Plan Note (Signed)
Hgb A1C is 6.7% showing good control.  Recheck in 6 months

## 2018-04-14 NOTE — Progress Notes (Signed)
BP 133/89 (BP Location: Left Arm, Cuff Size: Normal)   Pulse 64   Temp 98.9 F (37.2 C) (Oral)   Ht 6\' 3"  (1.905 m)   Wt 217 lb 3.2 oz (98.5 kg)   SpO2 93%   BMI 27.15 kg/m    Subjective:    Patient ID: Brendan [name], male    DOB: 1951/01/10, 67 y.o.   MRN: [id number]  HPI: Brendan [name] is a 67 y.o. male  Chief Complaint  Patient presents with  . Diabetes  . Hyperlipidemia  . Hypertension   Diabetes: Using medications without difficulties No hypoglycemic episodes No hyperglycemic episodes Feet problems: Feet and ankle swelling.  No pain.  Pt is aggravated and wants to decrease the swelling Blood Sugars averaging: 122 today and often lower eye exam within last year Last Hgb A1C: 7.1  Hypertension  Using medications without difficulty Average home BPs   Using medication without problems or lightheadedness No chest pain with exertion or shortness of breath No Edema  Elevated Cholesterol Using medications without problems No Muscle aches  Diet: Watches what he eats.   Exercise:Stays active   Relevant past medical, surgical, family and social history reviewed and updated as indicated. Interim medical history since our last visit reviewed. Allergies and medications reviewed and updated.  Review of Systems  Per HPI unless specifically indicated above     Objective:    BP 133/89 (BP Location: Left Arm, Cuff Size: Normal)   Pulse 64   Temp 98.9 F (37.2 C) (Oral)   Ht 6\' 3"  (1.905 m)   Wt 217 lb 3.2 oz (98.5 kg)   SpO2 93%   BMI 27.15 kg/m   Wt Readings from Last 3 Encounters:  04/14/18 217 lb 3.2 oz (98.5 kg)  03/20/18 217 lb 6.4 oz (98.6 kg)  01/14/18 210 lb 12.8 oz (95.6 kg)    Physical Exam  Constitutional: He is oriented to person, place, and time. He appears well-developed and well-nourished. No distress.  HENT:  Head: Normocephalic and atraumatic.  Eyes: Conjunctivae and lids are normal. Right eye exhibits no discharge. Left eye exhibits no  discharge. No scleral icterus.  Neck: Normal range of motion. Neck supple. No JVD present. Carotid bruit is not present.  Cardiovascular: Normal rate, regular rhythm and normal heart sounds.  Pulmonary/Chest: Effort normal and breath sounds normal. No respiratory distress.  Abdominal: Normal appearance. There is no splenomegaly or hepatomegaly.  Musculoskeletal: Normal range of motion.  Neurological: He is alert and oriented to person, place, and time.  Skin: Skin is warm, dry and intact. No rash noted. No pallor.  Psychiatric: He has a normal mood and affect. His behavior is normal. Judgment and thought content normal.    Results for orders placed or performed in visit on 03/31/18  HM DIABETES EYE EXAM  Result Value Ref Range   HM Diabetic Eye Exam No Retinopathy No Retinopathy      Assessment & Plan:   Problem List Items Addressed This Visit      Unprioritized   Hyperlipidemia LDL goal <70    Not fasting today.  Check next visit      Relevant Medications   amLODipine (NORVASC) 5 MG tablet   hydrochlorothiazide (HYDRODIURIL) 25 MG tablet   Hypertension    Stable but bothered by foot and leg swelling.  I suspect related to Amlodipine.  Cut back Amlodipine to 5 mg and add HCTZ 25 mg.        Relevant Medications  amLODipine (NORVASC) 5 MG tablet   hydrochlorothiazide (HYDRODIURIL) 25 MG tablet   Type 2 diabetes mellitus with hyperglycemia, without long-term current use of insulin (HCC) - Primary    Hgb A1C is 6.7% showing good control.  Recheck in 6 months      Relevant Orders   Comprehensive metabolic panel   Bayer DCA Hb [id number] Waived       Follow up plan: Return in about 1 month (around 05/12/2018) for for physical if due.

## 2018-04-15 ENCOUNTER — Encounter: Payer: Self-pay | Admitting: Unknown Physician Specialty

## 2018-04-15 LAB — COMPREHENSIVE METABOLIC PANEL
ALK PHOS: 80 IU/L (ref 39–117)
ALT: 19 IU/L (ref 0–44)
AST: 15 IU/L (ref 0–40)
Albumin/Globulin Ratio: 2.6 — ABNORMAL HIGH (ref 1.2–2.2)
Albumin: 4.6 g/dL (ref 3.6–4.8)
BUN/Creatinine Ratio: 12 (ref 10–24)
BUN: 13 mg/dL (ref 8–27)
Bilirubin Total: 0.2 mg/dL (ref 0.0–1.2)
CO2: 21 mmol/L (ref 20–29)
CREATININE: 1.11 mg/dL (ref 0.76–1.27)
Calcium: 9.2 mg/dL (ref 8.6–10.2)
Chloride: 106 mmol/L (ref 96–106)
GFR calc Af Amer: 80 mL/min/{1.73_m2} (ref >59)
GFR calc non Af Amer: 69 mL/min/{1.73_m2} (ref >59)
GLUCOSE: 158 mg/dL — AB (ref 65–99)
Globulin, Total: 1.8 g/dL (ref 1.5–4.5)
Potassium: 3.8 mmol/L (ref 3.5–5.2)
SODIUM: 144 mmol/L (ref 134–144)
Total Protein: 6.4 g/dL (ref 6.0–8.5)

## 2018-05-13 ENCOUNTER — Encounter: Payer: Self-pay | Admitting: Unknown Physician Specialty

## 2018-05-13 ENCOUNTER — Ambulatory Visit (INDEPENDENT_AMBULATORY_CARE_PROVIDER_SITE_OTHER): Payer: Medicare Other | Admitting: Unknown Physician Specialty

## 2018-05-13 VITALS — BP 153/89 | HR 58 | Ht 74.0 in | Wt 213.0 lb

## 2018-05-13 DIAGNOSIS — Z0000 Encounter for general adult medical examination without abnormal findings: Secondary | ICD-10-CM | POA: Diagnosis not present

## 2018-05-13 DIAGNOSIS — I10 Essential (primary) hypertension: Secondary | ICD-10-CM | POA: Diagnosis not present

## 2018-05-13 DIAGNOSIS — Z87891 Personal history of nicotine dependence: Secondary | ICD-10-CM

## 2018-05-13 DIAGNOSIS — E1165 Type 2 diabetes mellitus with hyperglycemia: Secondary | ICD-10-CM | POA: Diagnosis not present

## 2018-05-13 LAB — MICROALBUMIN, URINE WAIVED
CREATININE, URINE WAIVED: 100 mg/dL (ref 10–300)
MICROALB, UR WAIVED: 10 mg/L (ref 0–19)

## 2018-05-13 MED ORDER — TELMISARTAN-HCTZ 80-25 MG PO TABS: 1.0000 | ORAL_TABLET | Freq: Every day | ORAL | 1 refills | Status: DC

## 2018-05-13 NOTE — Assessment & Plan Note (Signed)
Not to goal.  Add Micardis 80 mg to current treatment.

## 2018-05-13 NOTE — Assessment & Plan Note (Signed)
?   Low dose CT

## 2018-05-13 NOTE — Progress Notes (Signed)
BP (!) 153/89   Pulse (!) 58   Ht  (1.88 m)   Wt 213 lb (96.6 kg)   SpO2 95%   BMI 27.35 kg/m    Subjective:    Patient ID: Brendan [name], male    DOB: 08/07/1951, 67 y.o.   MRN: [id number]  HPI: Brendan [name] is a 67 y.o. male  Chief Complaint  Patient presents with  . Annual Exam   Hypertension Last visit cut back Amlodipine due to ankle swelling.  This has improved.   Using medications without difficulty Average home BPs Not checking   No problems or lightheadedness No chest pain with exertion or shortness of breath No Edema   Social History   Socioeconomic History  . Marital status: Widowed    Spouse name: Not on file  . Number of children: Not on file  . Years of education: Not on file  . Highest education level: Not on file  Occupational History  . Not on file  Social Needs  . Financial resource strain: Not hard at all  . Food insecurity:    Worry: Never true    Inability: Never true  . Transportation needs:    Medical: No    Non-medical: No  Tobacco Use  . Smoking status: Former Smoker    Packs/day: 1.00    Years: 60.00    Pack years: 60.00    Types: Cigarettes    Last attempt to quit: 06/29/2017    Years since quitting: 0.8  . Smokeless tobacco: Never Used  Substance and Sexual Activity  . Alcohol use: No    Comment: previously drank heavily but quit ~ 20 yrs ago.  . Drug use: No  . Sexual activity: Yes  Lifestyle  . Physical activity:    Days per week: 0 days    Minutes per session: 0 min  . Stress: Not at all  Relationships  . Social connections:    Talks on phone: Never    Gets together: More than three times a week    Attends religious service: 1 to 4 times per year    Active member of club or organization: No    Attends meetings of clubs or organizations: Never    Relationship status: Widowed  . Intimate partner violence:    Fear of current or ex partner: No    Emotionally abused: No    Physically abused: No    Forced sexual  activity: No  Other Topics Concern  . Not on file  Social History Narrative   Lives in [place] by himself.  Works as Copy.  Does not routinely exercise.   Family History  Problem Relation Age of Onset  . Hypertension Mother   . Diabetes Mother   . Heart disease Father 63       multiple MI's   Past Medical History:  Diagnosis Date  . Chest pain    a. 05/2016 Lexi MV: EF 58%, no ischemia/infarct.  . Coronary artery disease   . Diabetes mellitus without complication (HCC)   . Diastolic dysfunction    a. 05/2016 Echo: EF 55%, Gr1 DD, mild TR/MR.  [name] Dyspnea   . Hyperlipidemia   . Hypertension   . Personal history of tobacco use, presenting hazards to health 05/03/2016   Past Surgical History:  Procedure Laterality Date  . BACK SURGERY    . CORONARY ARTERY BYPASS GRAFT N/A 07/01/2017   Procedure: CORONARY ARTERY BYPASS GRAFTING (CABG) x4, using left mammary artery and  right greater saphenous vein harvested endoscopically;  Surgeon: Kerin Perna, MD;  Location: [facility];  Service: Open Heart Surgery;  Laterality: N/A;  . KNEE SURGERY    . LEFT HEART CATH AND CORONARY ANGIOGRAPHY N/A 06/04/2017   Procedure: Left Heart Cath and Coronary Angiography;  Surgeon: Yvonne Kendall, MD;  Location: [facility] INVASIVE CV LAB;  Service: Cardiovascular;  Laterality: N/A;  . TEE WITHOUT CARDIOVERSION N/A 07/01/2017   Procedure: TRANSESOPHAGEAL ECHOCARDIOGRAM (TEE);  Surgeon: Donata Clay, Theron Arista, MD;  Location: [facility] OR;  Service: Open Heart Surgery;  Laterality: N/A;     Relevant past medical, surgical, family and social history reviewed and updated as indicated. Interim medical history since our last visit reviewed. Allergies and medications reviewed and updated.  Review of Systems  Constitutional: Negative.   HENT: Negative.   Eyes: Negative.   Respiratory: Negative.   Cardiovascular: Negative.   Gastrointestinal:       Not really constipation.  Just don't "move like they used to."  Occasionally skips a  day    Per HPI unless specifically indicated above     Objective:    BP (!) 153/89   Pulse (!) 58   Ht  (1.88 m)   Wt 213 lb (96.6 kg)   SpO2 95%   BMI 27.35 kg/m   Wt Readings from Last 3 Encounters:  05/13/18 213 lb (96.6 kg)  04/14/18 217 lb 3.2 oz (98.5 kg)  03/20/18 217 lb 6.4 oz (98.6 kg)    Physical Exam  Constitutional: He is oriented to person, place, and time. He appears well-developed and well-nourished. No distress.  HENT:  Head: Normocephalic and atraumatic.  Eyes: Conjunctivae and lids are normal. Right eye exhibits no discharge. Left eye exhibits no discharge. No scleral icterus.  Neck: Normal range of motion. Neck supple. No JVD present. Carotid bruit is not present.  Cardiovascular: Normal rate, regular rhythm and normal heart sounds.  Pulmonary/Chest: Effort normal and breath sounds normal. No respiratory distress.  Abdominal: Soft. Normal appearance and bowel sounds are normal. There is no splenomegaly or hepatomegaly.  Musculoskeletal: Normal range of motion.  Neurological: He is alert and oriented to person, place, and time.  Skin: Skin is warm, dry and intact. No rash noted. No pallor.  Psychiatric: He has a normal mood and affect. His behavior is normal. Judgment and thought content normal.    Results for orders placed or performed in visit on 04/14/18  Comprehensive metabolic panel  Result Value Ref Range   Glucose 158 (H) 65 - 99 mg/dL   BUN 13 8 - 27 mg/dL   Creatinine, Ser 1.61 0.76 - 1.27 mg/dL   GFR calc non Af Amer 69 >59 mL/min/1.73   GFR calc Af Amer 80 >59 mL/min/1.73   BUN/Creatinine Ratio 12 10 - 24   Sodium 144 134 - 144 mmol/L   Potassium 3.8 3.5 - 5.2 mmol/L   Chloride 106 96 - 106 mmol/L   CO2 21 20 - 29 mmol/L   Calcium 9.2 8.6 - 10.2 mg/dL   Total Protein 6.4 6.0 - 8.5 g/dL   Albumin 4.6 3.6 - 4.8 g/dL   Globulin, Total 1.8 1.5 - 4.5 g/dL   Albumin/Globulin Ratio 2.6 (H) 1.2 - 2.2   Bilirubin Total 0.2 0.0 - 1.2 mg/dL     Alkaline Phosphatase 80 39 - 117 IU/L   AST 15 0 - 40 IU/L   ALT 19 0 - 44 IU/L  Bayer DCA Hb A1c Waived  Result  Value Ref Range   Bayer DCA Hb A1c Waived 6.7 <7.0 %      Assessment & Plan:   Problem List Items Addressed This Visit      Unprioritized   Hypertension    Not to goal.  Add Micardis 80 mg to current treatment.        Relevant Medications   telmisartan-hydrochlorothiazide (MICARDIS HCT) 80-25 MG tablet   Other Relevant Orders   Microalbumin, Urine Waived   Personal history of tobacco use, presenting hazards to health    Low dose CT.        Type 2 diabetes mellitus with hyperglycemia, without long-term current use of insulin (HCC)   Relevant Medications   telmisartan-hydrochlorothiazide (MICARDIS HCT) 80-25 MG tablet   Other Relevant Orders   Microalbumin, Urine Waived    Other Visit Diagnoses    Annual physical exam    -  Primary       Follow up plan: Return in about 1 month (around 06/10/2018).

## 2018-05-19 ENCOUNTER — Telehealth: Payer: Self-pay | Admitting: *Deleted

## 2018-05-19 DIAGNOSIS — Z87891 Personal history of nicotine dependence: Secondary | ICD-10-CM

## 2018-05-19 DIAGNOSIS — Z122 Encounter for screening for malignant neoplasm of respiratory organs: Secondary | ICD-10-CM

## 2018-05-19 NOTE — Telephone Encounter (Signed)
Notified patient that annual lung cancer screening low dose CT scan is due currently or will be in near future. Confirmed that patient is within the age range of 55-77, and asymptomatic, (no signs or symptoms of lung cancer). Patient denies illness that would prevent curative treatment for lung cancer if found. Verified smoking history, (former, quit 06/2017, 41 pack year). The shared decision making visit was done 05/04/16. Patient is agreeable for CT scan being scheduled.

## 2018-05-28 ENCOUNTER — Ambulatory Visit
Admission: RE | Admit: 2018-05-28 | Discharge: 2018-05-28 | Disposition: A | Discharge status: Home / Self Care | Payer: Medicare Other | Source: Ambulatory Visit | Attending: Oncology | Admitting: Oncology

## 2018-05-28 DIAGNOSIS — I700 Atherosclerosis of aorta: Secondary | ICD-10-CM | POA: Diagnosis not present

## 2018-05-28 DIAGNOSIS — J439 Emphysema, unspecified: Secondary | ICD-10-CM | POA: Insufficient documentation

## 2018-05-28 DIAGNOSIS — Z122 Encounter for screening for malignant neoplasm of respiratory organs: Secondary | ICD-10-CM | POA: Insufficient documentation

## 2018-05-28 DIAGNOSIS — Z87891 Personal history of nicotine dependence: Secondary | ICD-10-CM | POA: Insufficient documentation

## 2018-06-02 ENCOUNTER — Encounter: Payer: Self-pay | Admitting: *Deleted

## 2018-06-10 ENCOUNTER — Ambulatory Visit (INDEPENDENT_AMBULATORY_CARE_PROVIDER_SITE_OTHER): Payer: Medicare Other | Admitting: Unknown Physician Specialty

## 2018-06-10 ENCOUNTER — Encounter: Payer: Self-pay | Admitting: Unknown Physician Specialty

## 2018-06-10 DIAGNOSIS — I10 Essential (primary) hypertension: Secondary | ICD-10-CM

## 2018-06-10 NOTE — Progress Notes (Signed)
   BP 128/70 (BP Location: Left Arm)   Pulse 60   Temp 98.5 F (36.9 C) (Oral)   Ht 6\' 2"  (1.88 m)   Wt 215 lb (97.5 kg)   SpO2 92%   BMI 27.60 kg/m    Subjective:    Patient ID: Brendan [name], male    DOB: May 09, 1951, 67 y.o.   MRN: [id number]  HPI: Brendan PrimusLarry Brogdon is a 67 y.o. male  Chief Complaint  Patient presents with  . Hypertension    1 month f/up- pt states that the micardis is making him sleepy   Hypertension Last visit added Micardis to current medications.  States the Micardis made him sleepy but states this is getting better.  However, BP not to goal    Average home BPs Not checking   No problems or lightheadedness No chest pain with exertion or shortness of breath No Edema   Relevant past medical, surgical, family and social history reviewed and updated as indicated. Interim medical history since our last visit reviewed. Allergies and medications reviewed and updated.  Review of Systems  Per HPI unless specifically indicated above     Objective:    BP 128/70 (BP Location: Left Arm)   Pulse 60   Temp 98.5 F (36.9 C) (Oral)   Ht 6\' 2"  (1.88 m)   Wt 215 lb (97.5 kg)   SpO2 92%   BMI 27.60 kg/m   Wt Readings from Last 3 Encounters:  06/10/18 215 lb (97.5 kg)  05/28/18 217 lb (98.4 kg)  05/13/18 213 lb (96.6 kg)    Physical Exam  Constitutional: He is oriented to person, place, and time. He appears well-developed and well-nourished. No distress.  HENT:  Head: Normocephalic and atraumatic.  Eyes: Conjunctivae and lids are normal. Right eye exhibits no discharge. Left eye exhibits no discharge. No scleral icterus.  Neck: Normal range of motion. Neck supple. No JVD present. Carotid bruit is not present.  Cardiovascular: Normal rate, regular rhythm and normal heart sounds.  Pulmonary/Chest: Effort normal and breath sounds normal. No respiratory distress.  Abdominal: Normal appearance. There is no splenomegaly or hepatomegaly.  Musculoskeletal: Normal range  of motion.  Neurological: He is alert and oriented to person, place, and time.  Skin: Skin is warm, dry and intact. No rash noted. No pallor.  Psychiatric: He has a normal mood and affect. His behavior is normal. Judgment and thought content normal.    Results for orders placed or performed in visit on 05/13/18  Microalbumin, Urine Waived  Result Value Ref Range   Microalb, Ur Waived 10 0 - 19 mg/L   Creatinine, Urine Waived 100 10 - 300 mg/dL   Microalb/Creat Ratio <30 <30 mg/g      Assessment & Plan:   Problem List Items Addressed This Visit      Unprioritized   Hypertension    On recheck, BP is 128/70.  Pt states it makes him sleepy for an hour and then improves.  Pt is willing to try it for a little while.  Will continue Micardis          Follow up plan: Return in about 1 month (around 07/08/2018), or if symptoms worsen or fail to improve.

## 2018-06-10 NOTE — Assessment & Plan Note (Addendum)
On recheck, BP is 128/70.  Pt states it makes him sleepy for an hour and then improves.  Pt is willing to try it for a little while.  Will continue Micardis

## 2018-07-15 ENCOUNTER — Ambulatory Visit (INDEPENDENT_AMBULATORY_CARE_PROVIDER_SITE_OTHER): Payer: Medicare Other | Admitting: Unknown Physician Specialty

## 2018-07-15 ENCOUNTER — Encounter: Payer: Self-pay | Admitting: Unknown Physician Specialty

## 2018-07-15 ENCOUNTER — Other Ambulatory Visit: Payer: Self-pay

## 2018-07-15 DIAGNOSIS — I10 Essential (primary) hypertension: Secondary | ICD-10-CM

## 2018-07-15 MED ORDER — CHLORTHALIDONE 25 MG PO TABS: 25.0000 mg | ORAL_TABLET | Freq: Every day | ORAL | 1 refills | Status: DC

## 2018-07-15 NOTE — Assessment & Plan Note (Signed)
BP not to goal.  Micardis causes fatigue and intolerant to Lisinopril.  Pulse is 59 so unable to increase Lopressor.  Will dc Telmisartan and HCTZ.  Start chlorthalidone 25 mg.

## 2018-07-15 NOTE — Progress Notes (Signed)
   BP (!) 149/93   Pulse (!) 59   Temp 98.5 F (36.9 C) (Oral)   Ht 6\' 2"  (1.88 m)   Wt 210 lb (95.3 kg)   SpO2 93%   BMI 26.96 kg/m    Subjective:    Patient ID: Brendan [name], male    DOB: 1951/12/28, 67 y.o.   MRN: [id number]  HPI: Brendan [name] is a 67 y.o. male  Chief Complaint  Patient presents with  . Follow-up    5882m// pt states he would like to get off the telmisartan med as it has been making him very sleepy and tired, if possible get something else instead   Hypertension Pt is complaining of fatigue associated with the Micardis.  Micardis was added with a decrease of the Amlodipine secondary to ankle swelling. Intolerant to Lisinopril Average home BPs Not checking   No problems or lightheadedness No chest pain with exertion or shortness of breath No Edema  Relevant past medical, surgical, family and social history reviewed and updated as indicated. Interim medical history since our last visit reviewed. Allergies and medications reviewed and updated.  Review of Systems  Per HPI unless specifically indicated above     Objective:    BP (!) 149/93   Pulse (!) 59   Temp 98.5 F (36.9 C) (Oral)   Ht 6\' 2"  (1.88 m)   Wt 210 lb (95.3 kg)   SpO2 93%   BMI 26.96 kg/m   Wt Readings from Last 3 Encounters:  07/15/18 210 lb (95.3 kg)  06/10/18 215 lb (97.5 kg)  05/28/18 217 lb (98.4 kg)    Physical Exam  Constitutional: He is oriented to person, place, and time. He appears well-developed and well-nourished. No distress.  HENT:  Head: Normocephalic and atraumatic.  Eyes: Conjunctivae and lids are normal. Right eye exhibits no discharge. Left eye exhibits no discharge. No scleral icterus.  Neck: Normal range of motion. Neck supple. No JVD present. Carotid bruit is not present.  Cardiovascular: Normal rate, regular rhythm and normal heart sounds.  Pulmonary/Chest: Effort normal and breath sounds normal. No respiratory distress.  Abdominal: Normal appearance. There is  no splenomegaly or hepatomegaly.  Musculoskeletal: Normal range of motion.  Neurological: He is alert and oriented to person, place, and time.  Skin: Skin is warm, dry and intact. No rash noted. No pallor.  Psychiatric: He has a normal mood and affect. His behavior is normal. Judgment and thought content normal.    Results for orders placed or performed in visit on 05/13/18  Microalbumin, Urine Waived  Result Value Ref Range   Microalb, Ur Waived 10 0 - 19 mg/L   Creatinine, Urine Waived 100 10 - 300 mg/dL   Microalb/Creat Ratio <30 <30 mg/g      Assessment & Plan:   Problem List Items Addressed This Visit      Unprioritized   Hypertension    BP not to goal.  Micardis causes fatigue and intolerant to Lisinopril.  Pulse is 59 so unable to increase Lopressor.  Will dc Telmisartan and HCTZ.  Start chlorthalidone 25 mg.       Relevant Medications   chlorthalidone (HYGROTON) 25 MG tablet       Follow up plan: Return in about 1 month (around 08/12/2018). 3 months for chronic disease management.

## 2018-08-04 ENCOUNTER — Other Ambulatory Visit: Payer: Self-pay | Admitting: *Deleted

## 2018-08-04 MED ORDER — FUROSEMIDE 20 MG PO TABS: 20.0000 mg | ORAL_TABLET | Freq: Every day | ORAL | 0 refills | Status: DC

## 2018-08-21 ENCOUNTER — Ambulatory Visit (INDEPENDENT_AMBULATORY_CARE_PROVIDER_SITE_OTHER): Payer: Medicare Other | Admitting: Physician Assistant

## 2018-08-21 ENCOUNTER — Encounter: Payer: Self-pay | Admitting: Physician Assistant

## 2018-08-21 VITALS — BP 137/86 | HR 64 | Temp 98.3°F | Ht 74.0 in | Wt 208.5 lb

## 2018-08-21 DIAGNOSIS — E1165 Type 2 diabetes mellitus with hyperglycemia: Secondary | ICD-10-CM | POA: Diagnosis not present

## 2018-08-21 DIAGNOSIS — I10 Essential (primary) hypertension: Secondary | ICD-10-CM | POA: Diagnosis not present

## 2018-08-21 DIAGNOSIS — E785 Hyperlipidemia, unspecified: Secondary | ICD-10-CM

## 2018-08-21 NOTE — Progress Notes (Signed)
   Subjective:    Patient ID: Brendan [name], male    DOB: [birth year], 67 y.o.   MRN: [id number]  Phillipe Clemon is a 67 y.o. male presenting on 08/21/2018 for Hypertension (1 month f/up)   HPI   Last time chlorthalidone 25 mg was added to Bp regimen. Currently taking amlodipine 5 mg daily, experienced ankle swelling at 10 mg amlodipine daily. Metorpolol 12.5 mg BID. He had low pulse with this.   BP Readings from Last 3 Encounters:  08/21/18 137/86  07/15/18 (!) 149/93  06/10/18 128/70   HLD:  Lipid Panel     Component Value Date/Time   CHOL 112 08/20/2017 1552   CHOL 111 02/29/2016 1558   TRIG 75 08/20/2017 1552   TRIG 73 02/29/2016 1558   HDL 47 08/20/2017 1552   VLDL 15 02/29/2016 1558   LDLCALC 50 08/20/2017 1552   DM II: Currently taking metformin 500 mg BID.   Lab Results  Component Value Date   HGBA1C 6.1 (H) 06/18/2017      Social History   Tobacco Use  . Smoking status: Former Smoker    Packs/day: 1.00    Years: 60.00    Pack years: 60.00    Types: Cigarettes    Last attempt to quit: 06/29/2017    Years since quitting: 1.1  . Smokeless tobacco: Never Used  Substance Use Topics  . Alcohol use: No    Comment: previously drank heavily but quit ~ 20 yrs ago.  . Drug use: No    Review of Systems Per HPI unless specifically indicated above     Objective:    BP 137/86 (BP Location: Left Arm, Cuff Size: Normal)   Pulse 64   Temp 98.3 F (36.8 C) (Oral)   Ht '6\' 2"'$  (1.88 m)   Wt 208 lb 8 oz (94.6 kg)   SpO2 94%   BMI 26.77 kg/m   Wt Readings from Last 3 Encounters:  08/21/18 208 lb 8 oz (94.6 kg)  07/15/18 210 lb (95.3 kg)  06/10/18 215 lb (97.5 kg)    Physical Exam  Constitutional: He is oriented to person, place, and time. He appears well-developed and well-nourished.  Cardiovascular: Normal rate and regular rhythm.  Pulmonary/Chest: Effort normal and breath sounds normal.  Musculoskeletal: He exhibits no edema.  Neurological: He is alert and  oriented to person, place, and time.  Skin: Skin is warm and dry.  Psychiatric: He has a normal mood and affect. His behavior is normal.   Results for orders placed or performed in visit on 05/13/18  Microalbumin, Urine Waived  Result Value Ref Range   Microalb, Ur Waived 10 0 - 19 mg/L   Creatinine, Urine Waived 100 10 - 300 mg/dL   Microalb/Creat Ratio <30 <30 mg/g      Assessment & Plan:  1. Essential hypertension  Continue current medications. CMET to check electrolytes.   - Comp Met (CMET)  2. Type 2 diabetes mellitus with hyperglycemia, without long-term current use of insulin (HCC)  Continue metformin, A1c and lipids as below.  - HgB A1c - Lipid Profile  3. Hyperlipidemia  Lipitor 10 mg daily.   Follow up plan: Return in about 3 months (around 11/21/2018) for dm, htn, hld.  Carles Collet, PA-C [facility] 08/21/2018, 2:35 PM

## 2018-08-21 NOTE — Patient Instructions (Signed)

## 2018-08-22 ENCOUNTER — Other Ambulatory Visit: Payer: Self-pay | Admitting: Physician Assistant

## 2018-08-22 DIAGNOSIS — I10 Essential (primary) hypertension: Secondary | ICD-10-CM

## 2018-08-22 LAB — COMPREHENSIVE METABOLIC PANEL
ALT: 20 IU/L (ref 0–44)
AST: 16 IU/L (ref 0–40)
Albumin/Globulin Ratio: 2.4 — ABNORMAL HIGH (ref 1.2–2.2)
Albumin: 4.7 g/dL (ref 3.6–4.8)
Alkaline Phosphatase: 58 IU/L (ref 39–117)
BUN/Creatinine Ratio: 16 (ref 10–24)
BUN: 21 mg/dL (ref 8–27)
Bilirubin Total: 0.4 mg/dL (ref 0.0–1.2)
CO2: 28 mmol/L (ref 20–29)
Calcium: 9.9 mg/dL (ref 8.6–10.2)
Chloride: 99 mmol/L (ref 96–106)
Creatinine, Ser: 1.29 mg/dL — ABNORMAL HIGH (ref 0.76–1.27)
GFR calc Af Amer: 66 mL/min/{1.73_m2} (ref >59)
GFR calc non Af Amer: 57 mL/min/{1.73_m2} — ABNORMAL LOW (ref >59)
Globulin, Total: 2 g/dL (ref 1.5–4.5)
Glucose: 101 mg/dL — ABNORMAL HIGH (ref 65–99)
Potassium: 3.2 mmol/L — ABNORMAL LOW (ref 3.5–5.2)
Sodium: 145 mmol/L — ABNORMAL HIGH (ref 134–144)
Total Protein: 6.7 g/dL (ref 6.0–8.5)

## 2018-08-22 LAB — LIPID PANEL
Chol/HDL Ratio: 3.5 ratio (ref 0.0–5.0)
Cholesterol, Total: 105 mg/dL (ref 100–199)
HDL: 30 mg/dL — ABNORMAL LOW (ref >39)
LDL Calculated: 47 mg/dL (ref 0–99)
Triglycerides: 139 mg/dL (ref 0–149)
VLDL Cholesterol Cal: 28 mg/dL (ref 5–40)

## 2018-08-22 LAB — HEMOGLOBIN A1C
Est. average glucose Bld gHb Est-mCnc: 166 mg/dL
Hgb A1c MFr Bld: 7.4 % — ABNORMAL HIGH (ref 4.8–5.6)

## 2018-08-23 ENCOUNTER — Other Ambulatory Visit: Payer: Self-pay | Admitting: Unknown Physician Specialty

## 2018-08-23 DIAGNOSIS — I10 Essential (primary) hypertension: Secondary | ICD-10-CM

## 2018-08-29 ENCOUNTER — Other Ambulatory Visit: Payer: Medicare Other

## 2018-08-29 DIAGNOSIS — I10 Essential (primary) hypertension: Secondary | ICD-10-CM

## 2018-08-30 LAB — COMPREHENSIVE METABOLIC PANEL
ALT: 21 IU/L (ref 0–44)
AST: 15 IU/L (ref 0–40)
Albumin/Globulin Ratio: 2.5 — ABNORMAL HIGH (ref 1.2–2.2)
Albumin: 4.7 g/dL (ref 3.6–4.8)
Alkaline Phosphatase: 57 IU/L (ref 39–117)
BUN/Creatinine Ratio: 16 (ref 10–24)
BUN: 20 mg/dL (ref 8–27)
Bilirubin Total: 0.4 mg/dL (ref 0.0–1.2)
CO2: 26 mmol/L (ref 20–29)
Calcium: 9.8 mg/dL (ref 8.6–10.2)
Chloride: 98 mmol/L (ref 96–106)
Creatinine, Ser: 1.25 mg/dL (ref 0.76–1.27)
GFR calc Af Amer: 68 mL/min/{1.73_m2} (ref >59)
GFR calc non Af Amer: 59 mL/min/{1.73_m2} — ABNORMAL LOW (ref >59)
Globulin, Total: 1.9 g/dL (ref 1.5–4.5)
Glucose: 109 mg/dL — ABNORMAL HIGH (ref 65–99)
Potassium: 3.4 mmol/L — ABNORMAL LOW (ref 3.5–5.2)
Sodium: 142 mmol/L (ref 134–144)
Total Protein: 6.6 g/dL (ref 6.0–8.5)

## 2018-09-22 ENCOUNTER — Other Ambulatory Visit: Payer: Self-pay | Admitting: Unknown Physician Specialty

## 2018-10-24 ENCOUNTER — Other Ambulatory Visit: Payer: Self-pay | Admitting: Unknown Physician Specialty

## 2018-11-21 ENCOUNTER — Encounter: Payer: Self-pay | Admitting: Nurse Practitioner

## 2018-11-21 DIAGNOSIS — N183 Chronic kidney disease, stage 3 unspecified: Secondary | ICD-10-CM | POA: Insufficient documentation

## 2018-11-24 ENCOUNTER — Other Ambulatory Visit: Payer: Self-pay

## 2018-11-24 ENCOUNTER — Encounter: Payer: Self-pay | Admitting: Nurse Practitioner

## 2018-11-24 ENCOUNTER — Ambulatory Visit (INDEPENDENT_AMBULATORY_CARE_PROVIDER_SITE_OTHER): Payer: Medicare Other | Admitting: Nurse Practitioner

## 2018-11-24 ENCOUNTER — Other Ambulatory Visit: Payer: Self-pay | Admitting: Internal Medicine

## 2018-11-24 VITALS — BP 119/78 | HR 58 | Temp 97.8°F | Ht 75.0 in | Wt 207.0 lb

## 2018-11-24 DIAGNOSIS — E785 Hyperlipidemia, unspecified: Secondary | ICD-10-CM

## 2018-11-24 DIAGNOSIS — Z23 Encounter for immunization: Secondary | ICD-10-CM

## 2018-11-24 DIAGNOSIS — N183 Chronic kidney disease, stage 3 unspecified: Secondary | ICD-10-CM

## 2018-11-24 DIAGNOSIS — E1165 Type 2 diabetes mellitus with hyperglycemia: Secondary | ICD-10-CM

## 2018-11-24 DIAGNOSIS — I1310 Hypertensive heart and chronic kidney disease without heart failure, with stage 1 through stage 4 chronic kidney disease, or unspecified chronic kidney disease: Secondary | ICD-10-CM

## 2018-11-24 LAB — BAYER DCA HB A1C WAIVED: HB A1C (BAYER DCA - WAIVED): 7.5 % — ABNORMAL HIGH (ref <7.0)

## 2018-11-24 MED ORDER — METOPROLOL TARTRATE 25 MG PO TABS: 12.5000 mg | ORAL_TABLET | Freq: Two times a day (BID) | ORAL | 3 refills | Status: DC

## 2018-11-24 MED ORDER — TAMSULOSIN HCL 0.4 MG PO CAPS: ORAL_CAPSULE | ORAL | 3 refills | Status: DC

## 2018-11-24 MED ORDER — MONTELUKAST SODIUM 10 MG PO TABS: 10.0000 mg | ORAL_TABLET | Freq: Every day | ORAL | 3 refills | Status: DC

## 2018-11-24 MED ORDER — ATORVASTATIN CALCIUM 20 MG PO TABS: 20.0000 mg | ORAL_TABLET | Freq: Every day | ORAL | 3 refills | Status: DC

## 2018-11-24 MED ORDER — CHLORTHALIDONE 25 MG PO TABS: 25.0000 mg | ORAL_TABLET | Freq: Every day | ORAL | 3 refills | Status: DC

## 2018-11-24 MED ORDER — METFORMIN HCL 500 MG PO TABS: 500.0000 mg | ORAL_TABLET | Freq: Two times a day (BID) | ORAL | 3 refills | Status: DC

## 2018-11-24 NOTE — Progress Notes (Signed)
BP 119/78   Pulse (!) 58   Temp 97.8 F (36.6 C) (Oral)   Ht 6\' 3"  (1.905 m)   Wt 207 lb (93.9 kg)   SpO2 98%   BMI 25.87 kg/m    Subjective:    Patient ID: Brendan [name], male    DOB: November 02, 1951, 67 y.o.   MRN: [id number]  HPI: Brendan [name] is a 67 y.o. male presents for HTN/HLD and T2DM  Chief Complaint  Patient presents with  . Diabetes    8080m f/u  . Hypertension  . Hyperlipidemia   DIABETES Continues on Metformin 500 MG by mouth twice a day.  Does not always maintain a diabetic diet regimen. Hypoglycemic episodes:no Polydipsia/polyuria: no Visual disturbance: no Chest pain: no Paresthesias: no Glucose Monitoring: yes  Accucheck frequency: Daily  Fasting glucose: 112 this morning, often in 120 to 130  Post prandial:  Evening:  Before meals: Taking Insulin?: no  Long acting insulin:  Short acting insulin: Blood Pressure Monitoring: not checking Retinal Examination: Up to Date Foot Exam: Up to Date Pneumovax: Up to Date Influenza: Up to Date Aspirin: yes  HYPERTENSION / HYPERLIPIDEMIA Satisfied with current treatment? yes Duration of hypertension: chronic BP monitoring frequency: not checking BP range:  BP medication side effects: no Past BP meds: none Duration of hyperlipidemia: chronic Cholesterol medication side effects: no Cholesterol supplements: none Past cholesterol medications: none Medication compliance: excellent compliance Aspirin: yes Recent stressors: no Recurrent headaches: no Visual changes: no Palpitations: no Dyspnea: no Chest pain: no Lower extremity edema: no Dizzy/lightheaded: no  Relevant past medical, surgical, family and social history reviewed and updated as indicated. Interim medical history since our last visit reviewed. Allergies and medications reviewed and updated.  Review of Systems  Constitutional: Negative for activity change, appetite change, diaphoresis, fatigue and fever.  Respiratory: Negative for cough,  chest tightness, shortness of breath and wheezing.   Cardiovascular: Negative for chest pain, palpitations and leg swelling.  Gastrointestinal: Negative for abdominal distention, abdominal pain, constipation, diarrhea, nausea and vomiting.  Endocrine: Negative for cold intolerance, heat intolerance, polydipsia, polyphagia and polyuria.  Genitourinary: Negative.   Musculoskeletal: Negative.   Skin: Negative.   Psychiatric/Behavioral: Negative for behavioral problems and sleep disturbance. The patient is not nervous/anxious.    Per HPI unless specifically indicated above     Objective:    BP 119/78   Pulse (!) 58   Temp 97.8 F (36.6 C) (Oral)   Ht 6\' 3"  (1.905 m)   Wt 207 lb (93.9 kg)   SpO2 98%   BMI 25.87 kg/m   Wt Readings from Last 3 Encounters:  11/24/18 207 lb (93.9 kg)  08/21/18 208 lb 8 oz (94.6 kg)  07/15/18 210 lb (95.3 kg)    Physical Exam  Constitutional: He is oriented to person, place, and time. He appears well-developed and well-nourished.  HENT:  Head: Normocephalic and atraumatic.  Right Ear: Hearing normal. No drainage.  Left Ear: Hearing normal. No drainage.  Mouth/Throat: Uvula is midline and mucous membranes are normal.  Eyes: Pupils are equal, round, and reactive to light. Conjunctivae, EOM and lids are normal. Right eye exhibits no discharge. Left eye exhibits no discharge.  Neck: Trachea normal and normal range of motion. Neck supple. No JVD present. Carotid bruit is not present. No thyromegaly present.  Cardiovascular: Normal rate, regular rhythm, S1 normal, S2 normal and normal heart sounds. Exam reveals no gallop.  No murmur heard. Pulmonary/Chest: Effort normal and breath sounds normal.  Abdominal: Soft.  Bowel sounds are normal. There is no splenomegaly or hepatomegaly.  Genitourinary: Rectum normal and penis normal.  Musculoskeletal: Normal range of motion.  Neurological: He is alert and oriented to person, place, and time. He has normal  reflexes.  Skin: Skin is warm, dry and intact. Capillary refill takes less than 2 seconds. No rash noted.  Psychiatric: He has a normal mood and affect. His behavior is normal. Judgment and thought content normal.  Nursing note and vitals reviewed.   Results for orders placed or performed in visit on 08/29/18  Comprehensive Metabolic Panel (CMET)  Result Value Ref Range   Glucose 109 (H) 65 - 99 mg/dL   BUN 20 8 - 27 mg/dL   Creatinine, Ser 1.47 0.76 - 1.27 mg/dL   GFR calc non Af Amer 59 (L) >59 mL/min/1.73   GFR calc Af Amer 68 >59 mL/min/1.73   BUN/Creatinine Ratio 16 10 - 24   Sodium 142 134 - 144 mmol/L   Potassium 3.4 (L) 3.5 - 5.2 mmol/L   Chloride 98 96 - 106 mmol/L   CO2 26 20 - 29 mmol/L   Calcium 9.8 8.6 - 10.2 mg/dL   Total Protein 6.6 6.0 - 8.5 g/dL   Albumin 4.7 3.6 - 4.8 g/dL   Globulin, Total 1.9 1.5 - 4.5 g/dL   Albumin/Globulin Ratio 2.5 (H) 1.2 - 2.2   Bilirubin Total 0.4 0.0 - 1.2 mg/dL   Alkaline Phosphatase 57 39 - 117 IU/L   AST 15 0 - 40 IU/L   ALT 21 0 - 44 IU/L      Assessment & Plan:   Problem List Items Addressed This Visit      Cardiovascular and Mediastinum   Hypertensive heart/kidney disease without HF and with CKD stage III (HCC)    Chronic, stable.  Blood pressure is below goal today.  Continue current regimen.      Relevant Medications   atorvastatin (LIPITOR) 20 MG tablet   chlorthalidone (HYGROTON) 25 MG tablet   metoprolol tartrate (LOPRESSOR) 25 MG tablet   Other Relevant Orders   Basic Metabolic Panel (BMET)   CBC with Differential     Endocrine   Type 2 diabetes mellitus with hyperglycemia, without long-term current use of insulin (HCC) - Primary    Chronic, ongoing.  Previous A1C 7.4% and A1C today 7.5%.  Discussed alternate medication options, as at this time d/t kidney function would not increase Metformin.  At this time he wishes to focus on diet and exercise.  If continued elevation at next appointment he agrees with adding  medication, would prefer oral.       Relevant Medications   atorvastatin (LIPITOR) 20 MG tablet   metFORMIN (GLUCOPHAGE) 500 MG tablet   Other Relevant Orders   Bayer DCA Hb A1c Waived (STAT)     Other   Hyperlipidemia LDL goal <70    Chronic, stable.  Continue current regimen.  Lipid panel next visit.      Relevant Medications   atorvastatin (LIPITOR) 20 MG tablet   chlorthalidone (HYGROTON) 25 MG tablet   metoprolol tartrate (LOPRESSOR) 25 MG tablet    Other Visit Diagnoses    Flu vaccine need       Relevant Orders   Flu vaccine HIGH DOSE PF (Completed)       Follow up plan: Return in about 3 months (around 02/24/2019) for T2DM, HTN, HLD.

## 2018-11-24 NOTE — Assessment & Plan Note (Addendum)
Chronic, ongoing.  Previous A1C 7.4% and A1C today 7.5%.  Discussed alternate medication options, as at this time d/t kidney function would not increase Metformin.  At this time he wishes to focus on diet and exercise.  If continued elevation at next appointment he agrees with adding medication, would prefer oral.

## 2018-11-24 NOTE — Assessment & Plan Note (Signed)
Chronic, stable.  Blood pressure is below goal today.  Continue current regimen.

## 2018-11-24 NOTE — Patient Instructions (Signed)
Diabetes Mellitus and Nutrition When you have diabetes (diabetes mellitus), it is very important to have healthy eating habits because your blood sugar (glucose) levels are greatly affected by what you eat and drink. Eating healthy foods in the appropriate amounts, at about the same times every day, can help you:  Control your blood glucose.  Lower your risk of heart disease.  Improve your blood pressure.  Reach or maintain a healthy weight.  Every person with diabetes is different, and each person has different needs for a meal plan. Your health care provider may recommend that you work with a diet and nutrition specialist (dietitian) to make a meal plan that is best for you. Your meal plan may vary depending on factors such as:  The calories you need.  The medicines you take.  Your weight.  Your blood glucose, blood pressure, and cholesterol levels.  Your activity level.  Other health conditions you have, such as heart or kidney disease.  How do carbohydrates affect me? Carbohydrates affect your blood glucose level more than any other type of food. Eating carbohydrates naturally increases the amount of glucose in your blood. Carbohydrate counting is a method for keeping track of how many carbohydrates you eat. Counting carbohydrates is important to keep your blood glucose at a healthy level, especially if you use insulin or take certain oral diabetes medicines. It is important to know how many carbohydrates you can safely have in each meal. This is different for every person. Your dietitian can help you calculate how many carbohydrates you should have at each meal and for snack. Foods that contain carbohydrates include:  Bread, cereal, rice, pasta, and crackers.  Potatoes and corn.  Peas, beans, and lentils.  Milk and yogurt.  Fruit and juice.  Desserts, such as cakes, cookies, ice cream, and candy.  How does alcohol affect me? Alcohol can cause a sudden decrease in blood  glucose (hypoglycemia), especially if you use insulin or take certain oral diabetes medicines. Hypoglycemia can be a life-threatening condition. Symptoms of hypoglycemia (sleepiness, dizziness, and confusion) are similar to symptoms of having too much alcohol. If your health care provider says that alcohol is safe for you, follow these guidelines:  Limit alcohol intake to no more than 1 drink per day for nonpregnant women and 2 drinks per day for men. One drink equals 12 oz of beer, 5 oz of wine, or 1 oz of hard liquor.  Do not drink on an empty stomach.  Keep yourself hydrated with water, diet soda, or unsweetened iced tea.  Keep in mind that regular soda, juice, and other mixers may contain a lot of sugar and must be counted as carbohydrates.  What are tips for following this plan? Reading food labels  Start by checking the serving size on the label. The amount of calories, carbohydrates, fats, and other nutrients listed on the label are based on one serving of the food. Many foods contain more than one serving per package.  Check the total grams (g) of carbohydrates in one serving. You can calculate the number of servings of carbohydrates in one serving by dividing the total carbohydrates by 15. For example, if a food has 30 g of total carbohydrates, it would be equal to 2 servings of carbohydrates.  Check the number of grams (g) of saturated and trans fats in one serving. Choose foods that have low or no amount of these fats.  Check the number of milligrams (mg) of sodium in one serving. Most people   should limit total sodium intake to less than 2,300 mg per day.  Always check the nutrition information of foods labeled as "low-fat" or "nonfat". These foods may be higher in added sugar or refined carbohydrates and should be avoided.  Talk to your dietitian to identify your daily goals for nutrients listed on the label. Shopping  Avoid buying canned, premade, or processed foods. These  foods tend to be high in fat, sodium, and added sugar.  Shop around the outside edge of the grocery store. This includes fresh fruits and vegetables, bulk grains, fresh meats, and fresh dairy. Cooking  Use low-heat cooking methods, such as baking, instead of high-heat cooking methods like deep frying.  Cook using healthy oils, such as olive, canola, or sunflower oil.  Avoid cooking with butter, cream, or high-fat meats. Meal planning  Eat meals and snacks regularly, preferably at the same times every day. Avoid going long periods of time without eating.  Eat foods high in fiber, such as fresh fruits, vegetables, beans, and whole grains. Talk to your dietitian about how many servings of carbohydrates you can eat at each meal.  Eat 4-6 ounces of lean protein each day, such as lean meat, chicken, fish, eggs, or tofu. 1 ounce is equal to 1 ounce of meat, chicken, or fish, 1 egg, or 1/4 cup of tofu.  Eat some foods each day that contain healthy fats, such as avocado, nuts, seeds, and fish. Lifestyle   Check your blood glucose regularly.  Exercise at least 30 minutes 5 or more days each week, or as told by your health care provider.  Take medicines as told by your health care provider.  Do not use any products that contain nicotine or tobacco, such as cigarettes and e-cigarettes. If you need help quitting, ask your health care provider.  Work with a counselor or diabetes educator to identify strategies to manage stress and any emotional and social challenges. What are some questions to ask my health care provider?  Do I need to meet with a diabetes educator?  Do I need to meet with a dietitian?  What number can I call if I have questions?  When are the best times to check my blood glucose? Where to find more information:  American Diabetes Association: diabetes.org/food-and-fitness/food  Academy of Nutrition and Dietetics:  www.eatright.org/resources/health/diseases-and-conditions/diabetes  National Institute of Diabetes and Digestive and Kidney Diseases (NIH): www.niddk.nih.gov/health-information/diabetes/overview/diet-eating-physical-activity Summary  A healthy meal plan will help you control your blood glucose and maintain a healthy lifestyle.  Working with a diet and nutrition specialist (dietitian) can help you make a meal plan that is best for you.  Keep in mind that carbohydrates and alcohol have immediate effects on your blood glucose levels. It is important to count carbohydrates and to use alcohol carefully. This information is not intended to replace advice given to you by your health care provider. Make sure you discuss any questions you have with your health care provider. Document Released: 09/13/2005 Document Revised: 01/21/2017 Document Reviewed: 01/21/2017 Elsevier Interactive Patient Education  2018 [employer]  

## 2018-11-24 NOTE — Assessment & Plan Note (Signed)
Chronic, stable.  Continue current regimen.  Lipid panel next visit.

## 2018-11-25 LAB — CBC WITH DIFFERENTIAL/PLATELET
BASOS ABS: 0.1 10*3/uL (ref 0.0–0.2)
Basos: 1 %
EOS (ABSOLUTE): 0.2 10*3/uL (ref 0.0–0.4)
Eos: 3 %
HEMATOCRIT: 47.9 % (ref 37.5–51.0)
HEMOGLOBIN: 16.3 g/dL (ref 13.0–17.7)
Immature Grans (Abs): 0 10*3/uL (ref 0.0–0.1)
Immature Granulocytes: 1 %
LYMPHS ABS: 2.4 10*3/uL (ref 0.7–3.1)
Lymphs: 33 %
MCH: 28.8 pg (ref 26.6–33.0)
MCHC: 34 g/dL (ref 31.5–35.7)
MCV: 85 fL (ref 79–97)
MONOCYTES: 8 %
MONOS ABS: 0.6 10*3/uL (ref 0.1–0.9)
NEUTROS ABS: 4.1 10*3/uL (ref 1.4–7.0)
Neutrophils: 54 %
Platelets: 219 10*3/uL (ref 150–450)
RBC: 5.65 x10E6/uL (ref 4.14–5.80)
RDW: 13.8 % (ref 12.3–15.4)
WBC: 7.4 10*3/uL (ref 3.4–10.8)

## 2018-11-25 LAB — BASIC METABOLIC PANEL
BUN/Creatinine Ratio: 15 (ref 10–24)
BUN: 19 mg/dL (ref 8–27)
CO2: 28 mmol/L (ref 20–29)
Calcium: 9.9 mg/dL (ref 8.6–10.2)
Chloride: 101 mmol/L (ref 96–106)
Creatinine, Ser: 1.24 mg/dL (ref 0.76–1.27)
GFR calc Af Amer: 69 mL/min/{1.73_m2} (ref >59)
GFR calc non Af Amer: 60 mL/min/{1.73_m2} (ref >59)
Glucose: 95 mg/dL (ref 65–99)
Potassium: 3.5 mmol/L (ref 3.5–5.2)
Sodium: 144 mmol/L (ref 134–144)

## 2018-11-25 NOTE — Progress Notes (Signed)
Normal test results noted.  Please call patient and make them aware of normal results and will continue to monitor at regular visits.  Have a great day

## 2018-12-15 ENCOUNTER — Encounter: Payer: Self-pay | Admitting: Nurse Practitioner

## 2018-12-15 ENCOUNTER — Other Ambulatory Visit: Payer: Self-pay

## 2018-12-15 ENCOUNTER — Ambulatory Visit (INDEPENDENT_AMBULATORY_CARE_PROVIDER_SITE_OTHER): Payer: Medicare Other | Admitting: Nurse Practitioner

## 2018-12-15 VITALS — BP 107/70 | HR 80 | Temp 98.2°F | Ht 75.0 in | Wt 207.0 lb

## 2018-12-15 DIAGNOSIS — J069 Acute upper respiratory infection, unspecified: Secondary | ICD-10-CM | POA: Diagnosis not present

## 2018-12-15 MED ORDER — LORATADINE 10 MG PO TABS: 10.0000 mg | ORAL_TABLET | Freq: Every day | ORAL | 2 refills | Status: AC

## 2018-12-15 MED ORDER — BENZONATATE 200 MG PO CAPS: 200.0000 mg | ORAL_CAPSULE | Freq: Two times a day (BID) | ORAL | 0 refills | Status: DC | PRN

## 2018-12-15 NOTE — Assessment & Plan Note (Signed)
Appears viral, with improvement reported today.  Script for [employer]essalon and Claritin sent.  Recommended continued use of  Acetaminophen as needed for fever/pain and Mucinex, humidifying the air for cough.  Recommended use of Albuterol inhaler if SOB or increased coughing/wheezing noted.  He is to return to office if worsening symptoms or continue symptoms and will consider abx treatment and CXR indicated.  Abx stewardship at this time as improving symptoms present.

## 2018-12-15 NOTE — Patient Instructions (Signed)
Upper Respiratory Infection, Adult Most upper respiratory infections (URIs) are caused by a virus. A URI affects the nose, throat, and upper air passages. The most common type of URI is often called "the common cold." Follow these instructions at home:  Take medicines only as told by your doctor.  Gargle warm saltwater or take cough drops to comfort your throat as told by your doctor.  Use a warm mist humidifier or inhale steam from a shower to increase air moisture. This may make it easier to breathe.  Drink enough fluid to keep your pee (urine) clear or pale yellow.  Eat soups and other clear broths.  Have a healthy diet.  Rest as needed.  Go back to work when your fever is gone or your doctor says it is okay. ? You may need to stay home longer to avoid giving your URI to others. ? You can also wear a face mask and wash your hands often to prevent spread of the virus.  Use your inhaler more if you have asthma.  Do not use any tobacco products, including cigarettes, chewing tobacco, or electronic cigarettes. If you need help quitting, ask your doctor. Contact a doctor if:  You are getting worse, not better.  Your symptoms are not helped by medicine.  You have chills.  You are getting more short of breath.  You have brown or red mucus.  You have yellow or brown discharge from your nose.  You have pain in your face, especially when you bend forward.  You have a fever.  You have puffy (swollen) neck glands.  You have pain while swallowing.  You have white areas in the back of your throat. Get help right away if:  You have very bad or constant: ? Headache. ? Ear pain. ? Pain in your forehead, behind your eyes, and over your cheekbones (sinus pain). ? Chest pain.  You have long-lasting (chronic) lung disease and any of the following: ? Wheezing. ? Long-lasting cough. ? Coughing up blood. ? A change in your usual mucus.  You have a stiff neck.  You have  changes in your: ? Vision. ? Hearing. ? Thinking. ? Mood. This information is not intended to replace advice given to you by your health care provider. Make sure you discuss any questions you have with your health care provider. Document Released: 06/04/2008 Document Revised: 08/19/2016 Document Reviewed: 03/24/2014 Elsevier Interactive Patient Education  2018 [employer]  

## 2018-12-15 NOTE — Progress Notes (Signed)
BP 107/70   Pulse 80   Temp 98.2 F (36.8 C) (Oral)   Ht 6\' 3"  (1.905 m)   Wt 207 lb (93.9 kg)   SpO2 95%   BMI 25.87 kg/m    Subjective:    Patient ID: Brendan [name], male    DOB: 04/05/1951, 67 y.o.   MRN: [id number]  HPI: Brendan [name] is a 67 y.o. male presents for upper respiratory symptoms  Chief Complaint  Patient presents with  . Cough    since Friday per patient/ has tried OTC mucinex  . Nasal Congestion  . Headache  . Chills    UPPER RESPIRATORY TRACT INFECTION Started last Thursday night, got up in middle of the night cold.  The next morning he went to work and it got worse.  Started out with tightness in chest and sinus pressure. He reports it is "sorta getting better" and he "feels better today".  Having some whitish/clear phlegm production.  Symptoms present for 4 days with improvement per patient at this time. Worst symptom: cough, chest congestion Fever: no, has not been taking temp at home but states occasional chills Cough: yes Shortness of breath: yes with cough on occasion Wheezing: yes, has not been using Albuterol Chest pain: yes, with cough Chest tightness: yes, not as tight as it was (has been using Mucinex) Chest congestion: yes Nasal congestion: yes Runny nose: yes Post nasal drip: yes Sneezing: no Sore throat: no Swollen glands: no Sinus pressure: yes Headache: yes Face pain: no Toothache: no Ear pain: none Ear pressure: none Eyes red/itching:no Eye drainage/crusting: no  Vomiting: no Rash: no Fatigue: yes Sick contacts: yes Strep contacts: no  Context: fluctuating Recurrent sinusitis: no Relief with OTC cold/cough medications: yes , Mucinex helping some Treatments attempted: mucinex   Relevant past medical, surgical, family and social history reviewed and updated as indicated. Interim medical history since our last visit reviewed. Allergies and medications reviewed and updated.  Review of Systems  Constitutional: Positive for  chills and fatigue. Negative for activity change, diaphoresis and fever.  HENT: Positive for congestion, postnasal drip, rhinorrhea and sinus pressure. Negative for ear discharge, ear pain, facial swelling, sinus pain, sneezing and voice change.   Eyes: Negative for itching and visual disturbance.  Respiratory: Positive for cough and wheezing (has not used inhaler).   Cardiovascular: Negative for chest pain, palpitations and leg swelling.  Gastrointestinal: Negative for abdominal distention, abdominal pain, constipation, diarrhea, nausea and vomiting.  Endocrine: Negative for cold intolerance, heat intolerance, polydipsia, polyphagia and polyuria.  Musculoskeletal: Negative.   Skin: Negative.   Neurological: Negative for dizziness, syncope, weakness, light-headedness, numbness and headaches.  Psychiatric/Behavioral: Negative.     Per HPI unless specifically indicated above     Objective:    BP 107/70   Pulse 80   Temp 98.2 F (36.8 C) (Oral)   Ht 6\' 3"  (1.905 m)   Wt 207 lb (93.9 kg)   SpO2 95%   BMI 25.87 kg/m   Wt Readings from Last 3 Encounters:  12/15/18 207 lb (93.9 kg)  11/24/18 207 lb (93.9 kg)  08/21/18 208 lb 8 oz (94.6 kg)    Physical Exam Vitals signs and nursing note reviewed.  Constitutional:      Appearance: He is well-developed.  HENT:     Head: Normocephalic and atraumatic.     Right Ear: Hearing, tympanic membrane, ear canal and external ear normal. No drainage.     Left Ear: Hearing, tympanic membrane, ear canal and  external ear normal. No drainage.     Nose: Nose normal. No mucosal edema, congestion or rhinorrhea.     Right Sinus: No maxillary sinus tenderness or frontal sinus tenderness.     Left Sinus: No maxillary sinus tenderness or frontal sinus tenderness.     Mouth/Throat:     Mouth: Mucous membranes are moist.     Pharynx: Oropharynx is clear. Uvula midline. No oropharyngeal exudate or posterior oropharyngeal erythema.  Eyes:     General: Lids  are normal.        Right eye: No discharge.        Left eye: No discharge.     Extraocular Movements: Extraocular movements intact.     Conjunctiva/sclera: Conjunctivae normal.     Pupils: Pupils are equal, round, and reactive to light.  Neck:     Musculoskeletal: Normal range of motion and neck supple.     Thyroid: No thyromegaly.     Vascular: No carotid bruit or JVD.     Trachea: Trachea normal.  Cardiovascular:     Rate and Rhythm: Normal rate and regular rhythm.     Heart sounds: Normal heart sounds, S1 normal and S2 normal. No murmur. No gallop.   Pulmonary:     Effort: Pulmonary effort is normal. No respiratory distress.     Breath sounds: Normal breath sounds. No wheezing.  Abdominal:     General: Bowel sounds are normal.     Palpations: Abdomen is soft. There is no hepatomegaly or splenomegaly.  Genitourinary:    Penis: Normal.      Rectum: Normal.  Musculoskeletal: Normal range of motion.  Lymphadenopathy:     Head:     Right side of head: Submandibular adenopathy present. No submental or tonsillar adenopathy.     Left side of head: Submandibular adenopathy present. No submental or tonsillar adenopathy.     Cervical: No cervical adenopathy.  Skin:    General: Skin is warm and dry.     Capillary Refill: Capillary refill takes less than 2 seconds.     Findings: No rash.  Neurological:     Mental Status: He is alert and oriented to person, place, and time.     Deep Tendon Reflexes: Reflexes are normal and symmetric.  Psychiatric:        Mood and Affect: Mood normal.        Behavior: Behavior normal.        Thought Content: Thought content normal.        Judgment: Judgment normal.     Results for orders placed or performed in visit on 11/24/18  Bayer DCA Hb A1c Waived (STAT)  Result Value Ref Range   HB A1C (BAYER DCA - WAIVED) 7.5 (H) <7.0 %  Basic Metabolic Panel (BMET)  Result Value Ref Range   Glucose 95 65 - 99 mg/dL   BUN 19 8 - 27 mg/dL   Creatinine,  Ser 1.61 0.76 - 1.27 mg/dL   GFR calc non Af Amer 60 >59 mL/min/1.73   GFR calc Af Amer 69 >59 mL/min/1.73   BUN/Creatinine Ratio 15 10 - 24   Sodium 144 134 - 144 mmol/L   Potassium 3.5 3.5 - 5.2 mmol/L   Chloride 101 96 - 106 mmol/L   CO2 28 20 - 29 mmol/L   Calcium 9.9 8.6 - 10.2 mg/dL  CBC with Differential  Result Value Ref Range   WBC 7.4 3.4 - 10.8 x10E3/uL   RBC 5.65 4.14 - 5.80 x10E6/uL  Hemoglobin 16.3 13.0 - 17.7 g/dL   Hematocrit 16.1 09.6 - 51.0 %   MCV 85 79 - 97 fL   MCH 28.8 26.6 - 33.0 pg   MCHC 34.0 31.5 - 35.7 g/dL   RDW 04.5 40.9 - 81.1 %   Platelets 219 150 - 450 x10E3/uL   Neutrophils 54 Not Estab. %   Lymphs 33 Not Estab. %   Monocytes 8 Not Estab. %   Eos 3 Not Estab. %   Basos 1 Not Estab. %   Neutrophils Absolute 4.1 1.4 - 7.0 x10E3/uL   Lymphocytes Absolute 2.4 0.7 - 3.1 x10E3/uL   Monocytes Absolute 0.6 0.1 - 0.9 x10E3/uL   EOS (ABSOLUTE) 0.2 0.0 - 0.4 x10E3/uL   Basophils Absolute 0.1 0.0 - 0.2 x10E3/uL   Immature Granulocytes 1 Not Estab. %   Immature Grans (Abs) 0.0 0.0 - 0.1 x10E3/uL      Assessment & Plan:   Problem List Items Addressed This Visit      Respiratory   Upper respiratory infection, viral - Primary    Appears viral, with improvement reported today.  Script for [employer] and Claritin sent.  Recommended continued use of  Acetaminophen as needed for fever/pain and Mucinex, humidifying the air for cough.  Recommended use of Albuterol inhaler if SOB or increased coughing/wheezing noted.  He is to return to office if worsening symptoms or continue symptoms and will consider abx treatment and CXR indicated.  Abx stewardship at this time as improving symptoms present.          Follow up plan: Return if symptoms worsen or fail to improve.

## 2018-12-23 ENCOUNTER — Other Ambulatory Visit: Payer: Self-pay | Admitting: Unknown Physician Specialty

## 2018-12-23 NOTE — Telephone Encounter (Signed)
Requested medication (s) are due for refill today: yes  Requested medication (s) are on the active medication list: yes  Last refill:  12/17/17 for 30 tablets  Future visit scheduled: yes  Notes to clinic:  Prescription expired on 12/17/18.  Requested Prescriptions  Pending Prescriptions Disp Refills   sildenafil (REVATIO) 20 MG tablet [Pharmacy Med Name: SILDENAFIL 20 MG TABLET] 30 tablet 0    Sig: TAKE 1 TO 5 TABLETS AS NEEDED.     Urology: Erectile Dysfunction Agents Passed - 12/23/2018 12:40 PM      Passed - Last BP in normal range    BP Readings from Last 1 Encounters:  12/15/18 107/70         Passed - Valid encounter within last 12 months    Recent Outpatient Visits          1 week ago Upper respiratory infection, viral   [facility] [place]ady, [place]Jolene T, NP   4 weeks ago Type 2 diabetes mellitus with hyperglycemia, without long-term current use of insulin (HCC)   [facility] [place], [place] T, NP   4 months ago Essential hypertension   [facility] [place]valdo [place]ak, Adriana M, PA-C   5 months ago Essential hypertension   [facility] Gabriel CirriWicker, Cheryl, NP   6 months ago Essential hypertension   [facility] Gabriel CirriWicker, Cheryl, NP      Future Appointments            In 2 months [name], Dorie RankJolene T, [facility]   In 3 months  [facility]

## 2018-12-26 ENCOUNTER — Encounter: Payer: Self-pay | Admitting: Nurse Practitioner

## 2018-12-26 ENCOUNTER — Ambulatory Visit (INDEPENDENT_AMBULATORY_CARE_PROVIDER_SITE_OTHER): Payer: Medicare Other | Admitting: Nurse Practitioner

## 2018-12-26 DIAGNOSIS — E1165 Type 2 diabetes mellitus with hyperglycemia: Secondary | ICD-10-CM | POA: Diagnosis not present

## 2018-12-26 MED ORDER — METFORMIN HCL 1000 MG PO TABS: 1000.0000 mg | ORAL_TABLET | Freq: Two times a day (BID) | ORAL | 3 refills | Status: DC

## 2018-12-26 NOTE — Progress Notes (Signed)
BP 110/71 (BP Location: Left Arm, Patient Position: Sitting, Cuff Size: Normal)   Pulse (!) 57   Temp 97.9 F (36.6 C) (Oral)   Ht 6\' 3"  (1.905 m)   Wt 209 lb 1.6 oz (94.8 kg)   SpO2 96%   BMI 26.14 kg/m    Subjective:    Patient ID: Brendan [name], male    DOB: 02-18-1951, 67 y.o.   MRN: [id number]  HPI: Brendan [name] is a 67 y.o. male presents for elevated blood sugars.  Chief Complaint  Patient presents with  . Blood Sugar Problem    Sugars have been high.    DIABETES Last A1C 11/24/18 was 7.5 and prior to this in August 6.4. Hypoglycemic episodes:no Polydipsia/polyuria: reports he has been hungry and urinating "a bit more" Visual disturbance: no Chest pain: no Paresthesias: no Glucose Monitoring: yes  Accucheck frequency: BID  Fasting glucose: yesterday morning it was 202, this morning 185 (range in morning since his last visit 11/24/18 130 to 200)  Post prandial:  Evening:  Before meals: 130's Taking Insulin?: no  Long acting insulin:  Short acting insulin: Blood Pressure Monitoring: not checking Retinal Examination: Up to Date Foot Exam: Up to Date Aspirin: yes CURRENT CRCL BASED ON RECENT LABS 65.89 AND RECENT GFR 60.  Relevant past medical, surgical, family and social history reviewed and updated as indicated. Interim medical history since our last visit reviewed. Allergies and medications reviewed and updated.  Review of Systems  Constitutional: Negative for activity change, diaphoresis, fatigue and fever.  Respiratory: Negative for cough, chest tightness, shortness of breath and wheezing.   Cardiovascular: Negative for chest pain, palpitations and leg swelling.  Gastrointestinal: Negative for abdominal distention, abdominal pain, constipation, diarrhea, nausea and vomiting.  Endocrine: Negative for cold intolerance, heat intolerance, polydipsia, polyphagia and polyuria.  Musculoskeletal: Negative.   Skin: Negative.   Neurological: Negative for dizziness,  syncope, weakness, light-headedness, numbness and headaches.  Psychiatric/Behavioral: Negative.     Per HPI unless specifically indicated above     Objective:    BP 110/71 (BP Location: Left Arm, Patient Position: Sitting, Cuff Size: Normal)   Pulse (!) 57   Temp 97.9 F (36.6 C) (Oral)   Ht 6\' 3"  (1.905 m)   Wt 209 lb 1.6 oz (94.8 kg)   SpO2 96%   BMI 26.14 kg/m   Wt Readings from Last 3 Encounters:  12/26/18 209 lb 1.6 oz (94.8 kg)  12/15/18 207 lb (93.9 kg)  11/24/18 207 lb (93.9 kg)    Physical Exam Vitals signs and nursing note reviewed.  Constitutional:      Appearance: He is well-developed.  HENT:     Head: Normocephalic and atraumatic.     Right Ear: Hearing normal. No drainage.     Left Ear: Hearing normal. No drainage.     Mouth/Throat:     Pharynx: Uvula midline.  Eyes:     General: Lids are normal.        Right eye: No discharge.        Left eye: No discharge.     Conjunctiva/sclera: Conjunctivae normal.     Pupils: Pupils are equal, round, and reactive to light.  Neck:     Musculoskeletal: Normal range of motion and neck supple.     Thyroid: No thyromegaly.     Vascular: No carotid bruit or JVD.     Trachea: Trachea normal.  Cardiovascular:     Rate and Rhythm: Normal rate and regular rhythm.  Heart sounds: Normal heart sounds, S1 normal and S2 normal. No murmur. No gallop.   Pulmonary:     Effort: Pulmonary effort is normal.     Breath sounds: Normal breath sounds.  Abdominal:     General: Bowel sounds are normal.     Palpations: Abdomen is soft. There is no hepatomegaly or splenomegaly.  Musculoskeletal: Normal range of motion.  Skin:    General: Skin is warm and dry.     Capillary Refill: Capillary refill takes less than 2 seconds.     Findings: No rash.  Neurological:     Mental Status: He is alert and oriented to person, place, and time.     Deep Tendon Reflexes: Reflexes are normal and symmetric.  Psychiatric:        Mood and Affect:  Mood normal.        Behavior: Behavior normal.        Thought Content: Thought content normal.        Judgment: Judgment normal.     Results for orders placed or performed in visit on 11/24/18  Bayer DCA Hb A1c Waived (STAT)  Result Value Ref Range   HB A1C (BAYER DCA - WAIVED) 7.5 (H) <7.0 %  Basic Metabolic Panel (BMET)  Result Value Ref Range   Glucose 95 65 - 99 mg/dL   BUN 19 8 - 27 mg/dL   Creatinine, Ser 1.611.24 0.76 - 1.27 mg/dL   GFR calc non Af Amer 60 >59 mL/min/1.73   GFR calc Af Amer 69 >59 mL/min/1.73   BUN/Creatinine Ratio 15 10 - 24   Sodium 144 134 - 144 mmol/L   Potassium 3.5 3.5 - 5.2 mmol/L   Chloride 101 96 - 106 mmol/L   CO2 28 20 - 29 mmol/L   Calcium 9.9 8.6 - 10.2 mg/dL  CBC with Differential  Result Value Ref Range   WBC 7.4 3.4 - 10.8 x10E3/uL   RBC 5.65 4.14 - 5.80 x10E6/uL   Hemoglobin 16.3 13.0 - 17.7 g/dL   Hematocrit 09.647.9 04.537.5 - 51.0 %   MCV 85 79 - 97 fL   MCH 28.8 26.6 - 33.0 pg   MCHC 34.0 31.5 - 35.7 g/dL   RDW 40.913.8 81.112.3 - 91.415.4 %   Platelets 219 150 - 450 x10E3/uL   Neutrophils 54 Not Estab. %   Lymphs 33 Not Estab. %   Monocytes 8 Not Estab. %   Eos 3 Not Estab. %   Basos 1 Not Estab. %   Neutrophils Absolute 4.1 1.4 - 7.0 x10E3/uL   Lymphocytes Absolute 2.4 0.7 - 3.1 x10E3/uL   Monocytes Absolute 0.6 0.1 - 0.9 x10E3/uL   EOS (ABSOLUTE) 0.2 0.0 - 0.4 x10E3/uL   Basophils Absolute 0.1 0.0 - 0.2 x10E3/uL   Immature Granulocytes 1 Not Estab. %   Immature Grans (Abs) 0.0 0.0 - 0.1 x10E3/uL      Assessment & Plan:   Problem List Items Addressed This Visit      Endocrine   Type 2 diabetes mellitus with hyperglycemia, without long-term current use of insulin (HCC)    Elevated sugar at home and recent A1C 7.5%.  Current GFR 60.  Will increase Metformin to 1000 MG BID.  A1C in February and BMP + microabumin.      Relevant Medications   metFORMIN (GLUCOPHAGE) 1000 MG tablet       Follow up plan: Return has scheduled appointment  February.

## 2018-12-26 NOTE — Assessment & Plan Note (Signed)
Elevated sugar at home and recent A1C 7.5%.  Current GFR 60.  Will increase Metformin to 1000 MG BID.  A1C in February and BMP + microabumin.

## 2018-12-26 NOTE — Patient Instructions (Signed)
Diabetes Mellitus and Nutrition, Adult  When you have diabetes (diabetes mellitus), it is very important to have healthy eating habits because your blood sugar (glucose) levels are greatly affected by what you eat and drink. Eating healthy foods in the appropriate amounts, at about the same times every day, can help you:  · Control your blood glucose.  · Lower your risk of heart disease.  · Improve your blood pressure.  · Reach or maintain a healthy weight.  Every person with diabetes is different, and each person has different needs for a meal plan. Your health care provider may recommend that you work with a diet and nutrition specialist (dietitian) to make a meal plan that is best for you. Your meal plan may vary depending on factors such as:  · The calories you need.  · The medicines you take.  · Your weight.  · Your blood glucose, blood pressure, and cholesterol levels.  · Your activity level.  · Other health conditions you have, such as heart or kidney disease.  How do carbohydrates affect me?  Carbohydrates, also called carbs, affect your blood glucose level more than any other type of food. Eating carbs naturally raises the amount of glucose in your blood. Carb counting is a method for keeping track of how many carbs you eat. Counting carbs is important to keep your blood glucose at a healthy level, especially if you use insulin or take certain oral diabetes medicines.  It is important to know how many carbs you can safely have in each meal. This is different for every person. Your dietitian can help you calculate how many carbs you should have at each meal and for each snack.  Foods that contain carbs include:  · Bread, cereal, rice, pasta, and crackers.  · Potatoes and corn.  · Peas, beans, and lentils.  · Milk and yogurt.  · Fruit and juice.  · Desserts, such as cakes, cookies, ice cream, and candy.  How does alcohol affect me?  Alcohol can cause a sudden decrease in blood glucose (hypoglycemia),  especially if you use insulin or take certain oral diabetes medicines. Hypoglycemia can be a life-threatening condition. Symptoms of hypoglycemia (sleepiness, dizziness, and confusion) are similar to symptoms of having too much alcohol.  If your health care provider says that alcohol is safe for you, follow these guidelines:  · Limit alcohol intake to no more than 1 drink per day for nonpregnant women and 2 drinks per day for men. One drink equals 12 oz of beer, 5 oz of wine, or 1½ oz of hard liquor.  · Do not drink on an empty stomach.  · Keep yourself hydrated with water, diet soda, or unsweetened iced tea.  · Keep in mind that regular soda, juice, and other mixers may contain a lot of sugar and must be counted as carbs.  What are tips for following this plan?    Reading food labels  · Start by checking the serving size on the "Nutrition Facts" label of packaged foods and drinks. The amount of calories, carbs, fats, and other nutrients listed on the label is based on one serving of the item. Many items contain more than one serving per package.  · Check the total grams (g) of carbs in one serving. You can calculate the number of servings of carbs in one serving by dividing the total carbs by 15. For example, if a food has 30 g of total carbs, it would be equal to 2   servings of carbs.  · Check the number of grams (g) of saturated and trans fats in one serving. Choose foods that have low or no amount of these fats.  · Check the number of milligrams (mg) of salt (sodium) in one serving. Most people should limit total sodium intake to less than 2,300 mg per day.  · Always check the nutrition information of foods labeled as "low-fat" or "nonfat". These foods may be higher in added sugar or refined carbs and should be avoided.  · Talk to your dietitian to identify your daily goals for nutrients listed on the label.  Shopping  · Avoid buying canned, premade, or processed foods. These foods tend to be high in fat, sodium,  and added sugar.  · Shop around the outside edge of the grocery store. This includes fresh fruits and vegetables, bulk grains, fresh meats, and fresh dairy.  Cooking  · Use low-heat cooking methods, such as baking, instead of high-heat cooking methods like deep frying.  · Cook using healthy oils, such as olive, canola, or sunflower oil.  · Avoid cooking with butter, cream, or high-fat meats.  Meal planning  · Eat meals and snacks regularly, preferably at the same times every day. Avoid going long periods of time without eating.  · Eat foods high in fiber, such as fresh fruits, vegetables, beans, and whole grains. Talk to your dietitian about how many servings of carbs you can eat at each meal.  · Eat 4-6 ounces (oz) of lean protein each day, such as lean meat, chicken, fish, eggs, or tofu. One oz of lean protein is equal to:  ? 1 oz of meat, chicken, or fish.  ? 1 egg.  ? ¼ cup of tofu.  · Eat some foods each day that contain healthy fats, such as avocado, nuts, seeds, and fish.  Lifestyle  · Check your blood glucose regularly.  · Exercise regularly as told by your health care provider. This may include:  ? 150 minutes of moderate-intensity or vigorous-intensity exercise each week. This could be brisk walking, biking, or water aerobics.  ? Stretching and doing strength exercises, such as yoga or weightlifting, at least 2 times a week.  · Take medicines as told by your health care provider.  · Do not use any products that contain nicotine or tobacco, such as cigarettes and e-cigarettes. If you need help quitting, ask your health care provider.  · Work with a counselor or diabetes educator to identify strategies to manage stress and any emotional and social challenges.  Questions to ask a health care provider  · Do I need to meet with a diabetes educator?  · Do I need to meet with a dietitian?  · What number can I call if I have questions?  · When are the best times to check my blood glucose?  Where to find more  information:  · American Diabetes Association: diabetes.org  · Academy of Nutrition and Dietetics: www.eatright.org  · National Institute of Diabetes and Digestive and Kidney Diseases (NIH): www.niddk.nih.gov  Summary  · A healthy meal plan will help you control your blood glucose and maintain a healthy lifestyle.  · Working with a diet and nutrition specialist (dietitian) can help you make a meal plan that is best for you.  · Keep in mind that carbohydrates (carbs) and alcohol have immediate effects on your blood glucose levels. It is important to count carbs and to use alcohol carefully.  This information is not intended to   replace advice given to you by your health care provider. Make sure you discuss any questions you have with your health care provider.  Document Released: 09/13/2005 Document Revised: 07/17/2017 Document Reviewed: 01/21/2017  [employer]vier Interactive Patient Education © 2019 [employer]

## 2019-01-14 ENCOUNTER — Encounter: Payer: Self-pay | Admitting: Podiatry

## 2019-01-14 ENCOUNTER — Ambulatory Visit (INDEPENDENT_AMBULATORY_CARE_PROVIDER_SITE_OTHER): Payer: Medicare Other | Admitting: Podiatry

## 2019-01-14 ENCOUNTER — Ambulatory Visit (INDEPENDENT_AMBULATORY_CARE_PROVIDER_SITE_OTHER): Payer: Medicare Other

## 2019-01-14 DIAGNOSIS — M722 Plantar fascial fibromatosis: Secondary | ICD-10-CM | POA: Diagnosis not present

## 2019-01-14 DIAGNOSIS — I739 Peripheral vascular disease, unspecified: Secondary | ICD-10-CM | POA: Diagnosis not present

## 2019-01-14 NOTE — Progress Notes (Signed)
Subjective:  Patient ID: Brendan PrimusLarry Olivares, male    DOB: [birth year],  MRN: [id number] HPI Chief Complaint  Patient presents with  . Foot Pain    Patient presents today for bilat tops of foot pain x months.  He states "they only hurt when Im wearing shoes with thinner soles"  He describes pain as "sharp pains" that come and go.  He reports his feet are doing good today.  The only relief is when he switches out his shoes    68 y.o. male presents with the above complaint.   ROS: Denies fever chills nausea vomiting muscle aches pains calf pain back pain chest pain shortness of breath.  The only calf pain he has is with ambulation on the right side.  Past Medical History:  Diagnosis Date  . Chest pain    a. 05/2016 Lexi MV: EF 58%, no ischemia/infarct.  . Coronary artery disease   . Diabetes mellitus without complication (HCC)   . Diastolic dysfunction    a. 05/2016 Echo: EF 55%, Gr1 DD, mild TR/MR.  [name]. Dyspnea   . Hyperlipidemia   . Hypertension   . Personal history of tobacco use, presenting hazards to health 05/03/2016   Past Surgical History:  Procedure Laterality Date  . BACK SURGERY    . CORONARY ARTERY BYPASS GRAFT N/A 07/01/2017   Procedure: CORONARY ARTERY BYPASS GRAFTING (CABG) x4, using left mammary artery and right greater saphenous vein harvested endoscopically;  Surgeon: Kerin PernaVan Trigt, Peter, MD;  [facility];  Service: Open Heart Surgery;  Laterality: N/A;  . KNEE SURGERY    . LEFT HEART CATH AND CORONARY ANGIOGRAPHY N/A 06/04/2017   Procedure: Left Heart Cath and Coronary Angiography;  Surgeon: Yvonne KendallEnd, Christopher, MD;  Location: [facility] INVASIVE CV LAB;  Service: Cardiovascular;  Laterality: N/A;  . TEE WITHOUT CARDIOVERSION N/A 07/01/2017   Procedure: TRANSESOPHAGEAL ECHOCARDIOGRAM (TEE);  Surgeon: Donata ClayVan Trigt, Theron AristaPeter, MD;  [facility];  Service: Open Heart Surgery;  Laterality: N/A;    Current Outpatient Medications:  .  acetaminophen (TYLENOL) 500 MG tablet, Take 1 tablet (500 mg  total) by mouth every 6 (six) hours as needed for mild pain or headache., Disp: 30 tablet, Rfl: 0 .  albuterol (PROVENTIL HFA;VENTOLIN HFA) 108 (90 Base) MCG/ACT inhaler, Inhale 2 puffs into the lungs every 6 (six) hours as needed for wheezing or shortness of breath., Disp: 1 Inhaler, Rfl: 2 .  amLODipine (NORVASC) 5 MG tablet, Take 1 tablet (5 mg total) by mouth daily., Disp: 90 tablet, Rfl: 3 .  aspirin EC 81 MG tablet, Take 1 tablet (81 mg total) by mouth daily., Disp: , Rfl:  .  atorvastatin (LIPITOR) 20 MG tablet, Take 1 tablet (20 mg total) by mouth daily., Disp: 90 tablet, Rfl: 3 .  azelastine (ASTELIN) 0.1 % nasal spray, Place 2 sprays into both nostrils at bedtime. , Disp: , Rfl:  .  chlorthalidone (HYGROTON) 25 MG tablet, Take 1 tablet (25 mg total) by mouth daily., Disp: 90 tablet, Rfl: 3 .  furosemide (LASIX) 20 MG tablet, Take 1 tablet (20 mg total) by mouth daily., Disp: 30 tablet, Rfl: 2 .  loratadine (CLARITIN) 10 MG tablet, Take 1 tablet (10 mg total) by mouth daily., Disp: 30 tablet, Rfl: 2 .  metFORMIN (GLUCOPHAGE) 1000 MG tablet, Take 1 tablet (1,000 mg total) by mouth 2 (two) times daily with a meal., Disp: 90 tablet, Rfl: 3 .  metoprolol tartrate (LOPRESSOR) 25 MG tablet, Take 0.5 tablets (12.5 mg total) by  mouth 2 (two) times daily., Disp: 90 tablet, Rfl: 3 .  montelukast (SINGULAIR) 10 MG tablet, Take 1 tablet (10 mg total) by mouth at bedtime., Disp: 90 tablet, Rfl: 3 .  sildenafil (REVATIO) 20 MG tablet, TAKE 1 TO 5 TABLETS AS NEEDED., Disp: 30 tablet, Rfl: 0 .  tamsulosin (FLOMAX) 0.4 MG CAPS capsule, Take 1 capsule (0.4 mg total) by mouth daily., Disp: 90 capsule, Rfl: 3  Allergies  Allergen Reactions  . Lisinopril Other (See Comments)    Fatigue and ED  . No Known Allergies    Review of Systems Objective:  There were no vitals filed for this visit.  General: Well developed, nourished, in no acute distress, alert and oriented x3   Dermatological: Skin is warm,  dry and supple bilateral. Nails x 10 are well maintained; remaining integument appears unremarkable at this time. There are no open sores, no preulcerative lesions, no rash or signs of infection present.  Vascular: Dorsalis Pedis artery and Posterior Tibial artery pedal pulses are 0/4 right and 1/4 left with increased capillary fill time.   Pedal hair growth present. No varicosities and no lower extremity edema present bilateral.   Neruologic: Grossly intact via light touch bilateral. Vibratory intact via tuning fork bilateral. Protective threshold with Semmes Wienstein monofilament intact to all pedal sites bilateral. Patellar and Achilles deep tendon reflexes 2+ bilateral. No Babinski or clonus noted bilateral.   Musculoskeletal: No gross boney pedal deformities bilateral. No pain, crepitus, or limitation noted with foot and ankle range of motion bilateral. Muscular strength 5/5 in all groups tested bilateral.  Pain on palpation dorsal aspect of the foot right greater than left.  Gait: Unassisted, Nonantalgic.    Radiographs:  No acute findings he does have calcification of significant arteries bilaterally.  Pes planus bilaterally.  Osteoarthritic changes of the midfoot bilateral.  Assessment & Plan:   Assessment: Peripheral vascular disease with osteoarthritic change of the midfoot bilaterally.  Diabetes peripheral arterial disease.  Plan: I will set him up with Raiford Noble for diabetic shoes and also set him up with the vascular department for evaluation of pulseless right leg.      T. [place], [place]

## 2019-01-14 NOTE — Progress Notes (Signed)
refer

## 2019-02-04 ENCOUNTER — Ambulatory Visit: Payer: Medicare Other | Admitting: Orthotics

## 2019-02-04 DIAGNOSIS — M722 Plantar fascial fibromatosis: Secondary | ICD-10-CM

## 2019-02-04 DIAGNOSIS — E1165 Type 2 diabetes mellitus with hyperglycemia: Secondary | ICD-10-CM

## 2019-02-04 DIAGNOSIS — I739 Peripheral vascular disease, unspecified: Secondary | ICD-10-CM

## 2019-02-04 NOTE — Progress Notes (Signed)

## 2019-02-06 ENCOUNTER — Encounter (INDEPENDENT_AMBULATORY_CARE_PROVIDER_SITE_OTHER): Payer: Self-pay | Admitting: Vascular Surgery

## 2019-02-06 ENCOUNTER — Ambulatory Visit (INDEPENDENT_AMBULATORY_CARE_PROVIDER_SITE_OTHER): Payer: Medicare Other

## 2019-02-06 ENCOUNTER — Ambulatory Visit (INDEPENDENT_AMBULATORY_CARE_PROVIDER_SITE_OTHER): Payer: Medicare Other | Admitting: Vascular Surgery

## 2019-02-06 VITALS — BP 155/86 | HR 59 | Resp 12 | Ht 74.0 in | Wt 203.0 lb

## 2019-02-06 DIAGNOSIS — M79605 Pain in left leg: Secondary | ICD-10-CM | POA: Diagnosis not present

## 2019-02-06 DIAGNOSIS — M79609 Pain in unspecified limb: Secondary | ICD-10-CM | POA: Insufficient documentation

## 2019-02-06 DIAGNOSIS — Z87891 Personal history of nicotine dependence: Secondary | ICD-10-CM | POA: Diagnosis not present

## 2019-02-06 DIAGNOSIS — E1165 Type 2 diabetes mellitus with hyperglycemia: Secondary | ICD-10-CM

## 2019-02-06 DIAGNOSIS — I739 Peripheral vascular disease, unspecified: Secondary | ICD-10-CM

## 2019-02-06 DIAGNOSIS — E785 Hyperlipidemia, unspecified: Secondary | ICD-10-CM

## 2019-02-06 DIAGNOSIS — N183 Chronic kidney disease, stage 3 unspecified: Secondary | ICD-10-CM

## 2019-02-06 DIAGNOSIS — I1310 Hypertensive heart and chronic kidney disease without heart failure, with stage 1 through stage 4 chronic kidney disease, or unspecified chronic kidney disease: Secondary | ICD-10-CM | POA: Diagnosis not present

## 2019-02-06 DIAGNOSIS — M79604 Pain in right leg: Secondary | ICD-10-CM | POA: Diagnosis not present

## 2019-02-06 NOTE — Assessment & Plan Note (Signed)
blood pressure control important in reducing the progression of atherosclerotic disease. On appropriate oral medications.  

## 2019-02-06 NOTE — Assessment & Plan Note (Signed)
Using noninvasives to evaluate vascular disease initially.  Any procedures will need to have limited amounts of contrast and hydration to avoid contrast nephropathy.

## 2019-02-06 NOTE — Patient Instructions (Signed)
Peripheral Vascular Disease  Peripheral vascular disease (PVD) is a disease of the blood vessels that are not part of your heart and brain. A simple term for PVD is poor circulation. In most cases, PVD narrows the blood vessels that carry blood from your heart to the rest of your body. This can reduce the supply of blood to your arms, legs, and internal organs, like your stomach or kidneys. However, PVD most often affects a person's lower legs and feet. Without treatment, PVD tends to get worse. PVD can also lead to acute ischemic limb. This is when an arm or leg suddenly cannot get enough blood. This is a medical emergency. Follow these instructions at home: Lifestyle  Do not use any products that contain nicotine or tobacco, such as cigarettes and e-cigarettes. If you need help quitting, ask your doctor.  Lose weight if you are overweight. Or, stay at a healthy weight as told by your doctor.  Eat a diet that is low in fat and cholesterol. If you need help, ask your doctor.  Exercise regularly. Ask your doctor for activities that are right for you. General instructions  Take over-the-counter and prescription medicines only as told by your doctor.  Take good care of your feet: ? Wear comfortable shoes that fit well. ? Check your feet often for any cuts or sores.  Keep all follow-up visits as told by your doctor This is important. Contact a doctor if:  You have cramps in your legs when you walk.  You have leg pain when you are at rest.  You have coldness in a leg or foot.  Your skin changes.  You are unable to get or have an erection (erectile dysfunction).  You have cuts or sores on your feet that do not heal. Get help right away if:  Your arm or leg turns cold, numb, and blue.  Your arms or legs become red, warm, swollen, painful, or numb.  You have chest pain.  You have trouble breathing.  You suddenly have weakness in your face, arm, or leg.  You become very  confused or you cannot speak.  You suddenly have a very bad headache.  You suddenly cannot see. Summary  Peripheral vascular disease (PVD) is a disease of the blood vessels.  A simple term for PVD is poor circulation. Without treatment, PVD tends to get worse.  Treatment may include exercise, low fat and low cholesterol diet, and quitting smoking. This information is not intended to replace advice given to you by your health care provider. Make sure you discuss any questions you have with your health care provider. Document Released: 03/13/2010 Document Revised: 01/24/2017 Document Reviewed: 01/24/2017 Elsevier Interactive Patient Education  2019 Elsevier Inc.  

## 2019-02-06 NOTE — Assessment & Plan Note (Signed)
blood glucose control important in reducing the progression of atherosclerotic disease. Also, involved in wound healing. On appropriate medications.  

## 2019-02-06 NOTE — Assessment & Plan Note (Signed)
lipid control important in reducing the progression of atherosclerotic disease. Continue statin therapy  

## 2019-02-06 NOTE — Assessment & Plan Note (Signed)
To evaluate his lower extremity arterial perfusion, noninvasive studies were performed today.  The patient had normal ABIs of 1.11 on the right and 1.01 on the left with biphasic or triphasic waveforms.  His digital pressures were normal bilaterally.  He does not appear to have any significant lower extremity arterial insufficiency at this time that would be causing his symptoms.  No further invasive vascular testing is recommended at this point.  I will see him back as needed.

## 2019-02-06 NOTE — Progress Notes (Signed)
Patient ID: Brendan [name], male   DOB: 1951-01-03, 68 y.o.   MRN: [id number]  Chief Complaint  Patient presents with  . New Patient (Initial Visit)    HPI Brendan [name] is a 68 y.o. male.  I am asked to see the patient by Dr. Al Corpus for evaluation fir PAD.  The patient reports pain and burning in his feet.  This has been steadily progressive over several months.  There is no clear inciting event or causative factor.  Both feet are affected.  He does not have open ulceration or wounds.  He does not describe typical claudication symptoms.  He has a litany of atherosclerotic risk factors as listed below.  He was seen by his podiatrist who was concerned about his lower extremity blood flow and referred him for further evaluation and treatment.   Past Medical History:  Diagnosis Date  . Chest pain    a. 05/2016 Lexi MV: EF 58%, no ischemia/infarct.  . Coronary artery disease   . Diabetes mellitus without complication (HCC)   . Diastolic dysfunction    a. 05/2016 Echo: EF 55%, Gr1 DD, mild TR/MR.  [name] Dyspnea   . Hyperlipidemia   . Hypertension   . Personal history of tobacco use, presenting hazards to health 05/03/2016    Past Surgical History:  Procedure Laterality Date  . BACK SURGERY    . CORONARY ARTERY BYPASS GRAFT N/A 07/01/2017   Procedure: CORONARY ARTERY BYPASS GRAFTING (CABG) x4, using left mammary artery and right greater saphenous vein harvested endoscopically;  Surgeon: Kerin Perna, MD;  Location: [facility] OR;  Service: Open Heart Surgery;  Laterality: N/A;  . KNEE SURGERY    . LEFT HEART CATH AND CORONARY ANGIOGRAPHY N/A 06/04/2017   Procedure: Left Heart Cath and Coronary Angiography;  Surgeon: Yvonne Kendall, MD;  Location: [facility] INVASIVE CV LAB;  Service: Cardiovascular;  Laterality: N/A;  . TEE WITHOUT CARDIOVERSION N/A 07/01/2017   Procedure: TRANSESOPHAGEAL ECHOCARDIOGRAM (TEE);  Surgeon: Donata Clay, Theron Arista, MD;  Location: [facility] OR;  Service: Open Heart Surgery;  Laterality: N/A;     Family History  Problem Relation Age of Onset  . Hypertension Mother   . Diabetes Mother   . Heart disease Father 99       multiple MI's  no bleeding or clotting disorders  Social History Social History   Tobacco Use  . Smoking status: Former Smoker    Packs/day: 1.00    Years: 60.00    Pack years: 60.00    Types: Cigarettes    Last attempt to quit: 06/29/2017    Years since quitting: 1.6  . Smokeless tobacco: Never Used  Substance Use Topics  . Alcohol use: No    Comment: previously drank heavily but quit ~ 20 yrs ago.  . Drug use: No    Allergies  Allergen Reactions  . Lisinopril Other (See Comments)    Fatigue and ED  . No Known Allergies     Current Outpatient Medications  Medication Sig Dispense Refill  . acetaminophen (TYLENOL) 500 MG tablet Take 1 tablet (500 mg total) by mouth every 6 (six) hours as needed for mild pain or headache. 30 tablet 0  . albuterol (PROVENTIL HFA;VENTOLIN HFA) 108 (90 Base) MCG/ACT inhaler Inhale 2 puffs into the lungs every 6 (six) hours as needed for wheezing or shortness of breath. 1 Inhaler 2  . amLODipine (NORVASC) 5 MG tablet Take 1 tablet (5 mg total) by mouth daily. 90 tablet 3  . aspirin  EC 81 MG tablet Take 1 tablet (81 mg total) by mouth daily.    [name] atorvastatin (LIPITOR) 20 MG tablet Take 1 tablet (20 mg total) by mouth daily. 90 tablet 3  . azelastine (ASTELIN) 0.1 % nasal spray Place 2 sprays into both nostrils at bedtime.     . chlorthalidone (HYGROTON) 25 MG tablet Take 1 tablet (25 mg total) by mouth daily. 90 tablet 3  . furosemide (LASIX) 20 MG tablet Take 1 tablet (20 mg total) by mouth daily. 30 tablet 2  . loratadine (CLARITIN) 10 MG tablet Take 1 tablet (10 mg total) by mouth daily. 30 tablet 2  . metFORMIN (GLUCOPHAGE) 1000 MG tablet Take 1 tablet (1,000 mg total) by mouth 2 (two) times daily with a meal. 90 tablet 3  . metoprolol tartrate (LOPRESSOR) 25 MG tablet Take 0.5 tablets (12.5 mg total) by mouth 2  (two) times daily. 90 tablet 3  . montelukast (SINGULAIR) 10 MG tablet Take 1 tablet (10 mg total) by mouth at bedtime. 90 tablet 3  . sildenafil (REVATIO) 20 MG tablet TAKE 1 TO 5 TABLETS AS NEEDED. 30 tablet 0  . tamsulosin (FLOMAX) 0.4 MG CAPS capsule Take 1 capsule (0.4 mg total) by mouth daily. 90 capsule 3   No current facility-administered medications for this visit.       REVIEW OF SYSTEMS (Negative unless checked)  Constitutional: Weight loss  Fever  Chills Cardiac: Chest pain   Chest pressure   Palpitations   Shortness of breath when laying flat   Shortness of breath at rest   Shortness of breath with exertion. Vascular:  Pain in legs with walking   Pain in legs at rest   Pain in legs when laying flat   Claudication   Pain in feet when walking  Pain in feet at rest  Pain in feet when laying flat   History of DVT   Phlebitis   Swelling in legs   Varicose veins   Non-healing ulcers Pulmonary:   Uses home oxygen   Productive cough   Hemoptysis   Wheeze  COPD   Asthma Neurologic:  Dizziness  Blackouts   Seizures   History of stroke   History of TIA  Aphasia   Temporary blindness   Dysphagia   Weakness or numbness in arms   Weakness or numbness in legs Musculoskeletal:  Arthritis   Joint swelling   Joint pain   Low back pain Hematologic:  Easy bruising  Easy bleeding   Hypercoagulable state   Anemic  Hepatitis Gastrointestinal:  Blood in stool   Vomiting blood  Gastroesophageal reflux/heartburn   Abdominal pain Genitourinary:  Chronic kidney disease   Difficult urination  Frequent urination  Burning with urination   Hematuria Skin:  Rashes   Ulcers   Wounds Psychological:  History of anxiety    History of major depression.    Physical Exam BP (!) 155/86 (BP Location: Right Arm, Patient Position: Sitting)   Pulse (!) 59   Resp 12   Ht  (1.88 m)   Wt 203 lb  (92.1 kg)   BMI 26.06 kg/m  Gen:  WD/WN, NAD Head: /AT, No temporalis wasting.  Ear/Nose/Throat: Hearing grossly intact, nares w/o erythema or drainage, oropharynx w/o Erythema/Exudate Eyes: Conjunctiva clear, sclera non-icteric  Neck: trachea midline.   Pulmonary:  Good air movement, respirations not labored, no use of accessory muscles Cardiac: RRR, no JVD Vascular:  Vessel Right Left  Radial Palpable Palpable  PT  1+ palpable  1+ palpable  DP  2+ palpable  2+ palpable   Gastrointestinal: soft, non-tender/non-distended.  Musculoskeletal: M/S 5/5 throughout.  Extremities without ischemic changes.  No deformity or atrophy.  Trace lower extremity edema. Neurologic: Sensation grossly intact in extremities.  Symmetrical.  Speech is fluent. Motor exam as listed above. Psychiatric: Judgment intact, Mood & affect appropriate for pt's clinical situation. Dermatologic: No rashes or ulcers noted.  No cellulitis or open wounds.   Radiology Dg Foot Complete Left  Result Date: 01/14/2019 Please see detailed radiograph report in office note.  Dg Foot Complete Right  Result Date: 01/14/2019 Please see detailed radiograph report in office note.   Labs Recent Results (from the past 2160 hour(s))  Bayer DCA Hb A1c Waived (STAT)     Status: Abnormal   Collection Time: 11/24/18  2:06 PM  Result Value Ref Range   HB A1C (BAYER DCA - WAIVED) 7.5 (H) <7.0 %    Comment:                                       Diabetic Adult            <7.0                                       Healthy Adult        4.3 - 5.7                                                           (DCCT/NGSP) American Diabetes Association's Summary of Glycemic Recommendations for Adults with Diabetes: Hemoglobin A1c <7.0%. More stringent glycemic goals (A1c <6.0%) may further reduce complications at the cost of increased risk of hypoglycemia.   Basic Metabolic Panel (BMET)     Status: None    Collection Time: 11/24/18  2:09 PM  Result Value Ref Range   Glucose 95 65 - 99 mg/dL   BUN 19 8 - 27 mg/dL   Creatinine, Ser 1.611.24 0.76 - 1.27 mg/dL   GFR calc non Af Amer 60 >59 mL/min/1.73   GFR calc Af Amer 69 >59 mL/min/1.73   BUN/Creatinine Ratio 15 10 - 24   Sodium 144 134 - 144 mmol/L   Potassium 3.5 3.5 - 5.2 mmol/L   Chloride 101 96 - 106 mmol/L   CO2 28 20 - 29 mmol/L   Calcium 9.9 8.6 - 10.2 mg/dL  CBC with Differential     Status: None   Collection Time: 11/24/18  2:09 PM  Result Value Ref Range   WBC 7.4 3.4 - 10.8 x10E3/uL   RBC 5.65 4.14 - 5.80 x10E6/uL   Hemoglobin 16.3 13.0 - 17.7 g/dL   Hematocrit 09.647.9 04.537.5 - 51.0 %   MCV 85 79 - 97 fL   MCH 28.8 26.6 - 33.0 pg   MCHC 34.0 31.5 - 35.7 g/dL   RDW 40.913.8 81.112.3 - 91.415.4 %   Platelets 219 150 - 450 x10E3/uL   Neutrophils 54 Not Estab. %   Lymphs 33 Not Estab. %   Monocytes 8 Not Estab. %   Eos 3 Not  Estab. %   Basos 1 Not Estab. %   Neutrophils Absolute 4.1 1.4 - 7.0 x10E3/uL   Lymphocytes Absolute 2.4 0.7 - 3.1 x10E3/uL   Monocytes Absolute 0.6 0.1 - 0.9 x10E3/uL   EOS (ABSOLUTE) 0.2 0.0 - 0.4 x10E3/uL   Basophils Absolute 0.1 0.0 - 0.2 x10E3/uL   Immature Granulocytes 1 Not Estab. 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 On appropriate oral medications.   Type 2 diabetes mellitus with hyperglycemia, without long-term current use of insulin (HCC) blood glucose control important in reducing the progression of atherosclerotic disease. Also, involved in wound healing. On appropriate medications.   CKD (chronic kidney disease) stage 3, GFR 30-59 ml/min (HCC) Using noninvasives to evaluate vascular disease initially.  Any procedures will need to have limited amounts of contrast and hydration to avoid contrast  nephropathy.  Hyperlipidemia LDL goal <70 lipid control important in reducing the progression of atherosclerotic disease. Continue statin therapy   Pain in limb To evaluate his lower extremity arterial perfusion, noninvasive studies were performed today.  The patient had normal ABIs of 1.11 on the right and 1.01 on the left with biphasic or triphasic waveforms.  His digital pressures were normal bilaterally.  He does not appear to have any significant lower extremity arterial insufficiency at this time that would be causing his symptoms.  No further invasive vascular testing is recommended at this point.  I will see him back as needed.      Festus Barren 02/06/2019, 2:31 PM   This note was created with Dragon medical transcription system.  Any errors from dictation are unintentional.

## 2019-02-09 ENCOUNTER — Telehealth: Payer: Self-pay | Admitting: Nurse Practitioner

## 2019-02-09 NOTE — Telephone Encounter (Signed)
Called and spoke with patient. Scheduled for Tuesday, 02/10/2019 at 2:15 pm with Jolene. FYI

## 2019-02-09 NOTE — Telephone Encounter (Signed)
Copied from CRM [id number]. Topic: General - Other >> Feb 09, 2019  1:59 PM Jilda Roche wrote: Reason for CRM: Rick from [facility] is calling stating that the patient is there and wanting his diabetic shoes, Raiford Noble states that he has to be seen by an MD in the office at least once and it has to reflect the MD in the Office Notes. Medicare will deny if a MD has not signed the last progress note.  Best call back is [phone]

## 2019-02-10 ENCOUNTER — Encounter: Payer: Self-pay | Admitting: Unknown Physician Specialty

## 2019-02-10 ENCOUNTER — Ambulatory Visit: Payer: Self-pay | Admitting: Nurse Practitioner

## 2019-02-10 ENCOUNTER — Ambulatory Visit (INDEPENDENT_AMBULATORY_CARE_PROVIDER_SITE_OTHER): Payer: Medicare Other | Admitting: Unknown Physician Specialty

## 2019-02-10 VITALS — BP 135/88 | HR 61 | Temp 98.4°F | Wt 206.2 lb

## 2019-02-10 DIAGNOSIS — E785 Hyperlipidemia, unspecified: Secondary | ICD-10-CM

## 2019-02-10 DIAGNOSIS — I119 Hypertensive heart disease without heart failure: Secondary | ICD-10-CM | POA: Diagnosis not present

## 2019-02-10 DIAGNOSIS — N183 Chronic kidney disease, stage 3 unspecified: Secondary | ICD-10-CM

## 2019-02-10 DIAGNOSIS — E1165 Type 2 diabetes mellitus with hyperglycemia: Secondary | ICD-10-CM

## 2019-02-10 DIAGNOSIS — M79672 Pain in left foot: Secondary | ICD-10-CM

## 2019-02-10 DIAGNOSIS — M79671 Pain in right foot: Secondary | ICD-10-CM | POA: Diagnosis not present

## 2019-02-10 LAB — MICROALBUMIN, URINE WAIVED
Creatinine, Urine Waived: 200 mg/dL (ref 10–300)
MICROALB, UR WAIVED: 10 mg/L (ref 0–19)
Microalb/Creat Ratio: <30 mg/g (ref <30)

## 2019-02-10 LAB — BAYER DCA HB A1C WAIVED: HB A1C (BAYER DCA - WAIVED): 7.2 % — ABNORMAL HIGH (ref <7.0)

## 2019-02-10 NOTE — Assessment & Plan Note (Signed)
Not clear if it's neuropathy.  Needs diabetic shoes with adequate arch support.

## 2019-02-10 NOTE — Progress Notes (Signed)
BP 135/88   Pulse 61   Temp 98.4 F (36.9 C) (Oral)   Wt 206 lb 3.2 oz (93.5 kg)   SpO2 94%   BMI 26.47 kg/m    Subjective:    Patient ID: Brendan [name], male    DOB: 1951/12/07, 68 y.o.   MRN: [id number]  HPI: Brendan [name] is a 68 y.o. male  Chief Complaint  Patient presents with  . Follow-up    pt here to discuss getting diabetic shoes. Needs OV note with documentation.    Diabetes: Using medications without difficulties No hypoglycemic episodes No hyperglycemic episodes Feet problems: Pain on bottom of feet Blood Sugars averaging: 107 eye exam within last year: 3 months Last Hgb A1C: 7.5  Hypertension  Using medications without difficulty Average home BPs Not checking   Using medication without problems or lightheadedness No chest pain with exertion or shortness of breath No Edema  Elevated Cholesterol Using medications without problems No Muscle aches  Diet: Exercise: Daily 5 hours/day   Relevant past medical, surgical, family and social history reviewed and updated as indicated. Interim medical history since our last visit reviewed. Allergies and medications reviewed and updated.  Review of Systems  Constitutional: Negative.   HENT: Negative.   Respiratory: Negative.   Gastrointestinal: Negative.   Genitourinary: Negative.   Psychiatric/Behavioral: Negative.     Per HPI unless specifically indicated above     Objective:    BP 135/88   Pulse 61   Temp 98.4 F (36.9 C) (Oral)   Wt 206 lb 3.2 oz (93.5 kg)   SpO2 94%   BMI 26.47 kg/m   Wt Readings from Last 3 Encounters:  02/10/19 206 lb 3.2 oz (93.5 kg)  02/06/19 203 lb (92.1 kg)  12/26/18 209 lb 1.6 oz (94.8 kg)    Physical Exam Constitutional:      General: He is not in acute distress.    Appearance: Normal appearance. He is well-developed.  HENT:     Head: Normocephalic and atraumatic.  Eyes:     General: Lids are normal. No scleral icterus.       Right eye: No discharge.      Left eye: No discharge.     Conjunctiva/sclera: Conjunctivae normal.  Neck:     Musculoskeletal: Normal range of motion and neck supple.     Vascular: No carotid bruit or JVD.  Cardiovascular:     Rate and Rhythm: Normal rate and regular rhythm.     Heart sounds: Normal heart sounds.  Pulmonary:     Effort: Pulmonary effort is normal. No respiratory distress.     Breath sounds: Normal breath sounds.  Abdominal:     Palpations: There is no hepatomegaly or splenomegaly.  Musculoskeletal: Normal range of motion.  Skin:    General: Skin is warm and dry.     Coloration: Skin is not pale.     Findings: No rash.  Neurological:     Mental Status: He is alert and oriented to person, place, and time.  Psychiatric:        Behavior: Behavior normal.        Thought Content: Thought content normal.        Judgment: Judgment normal.    Diabetic Foot Exam - Simple   Simple Foot Form Diabetic Foot exam was performed with the following findings:  Yes 02/10/2019  2:51 PM  Visual Inspection No deformities, no ulcerations, no other skin breakdown bilaterally:  Yes Sensation Testing See comments:  Yes Pulse Check See comments:  Yes Comments Decreased sensation bilateral heels, with right lateral greater than left Faint and hard to palpate pulses.  Seeing vascular for regular evaluation       Assessment & Plan:   Problem List Items Addressed This Visit      Unprioritized   RESOLVED: CKD (chronic kidney disease) stage 3, GFR 30-59 ml/min (HCC)    GFRs above 60 using African American male parameter.        Foot pain, bilateral    Not clear if it's neuropathy.  Needs diabetic shoes with adequate arch support.        Hyperlipidemia LDL goal <70    At goal with Atorvastatin 20 mg.        Hypertensive heart disease    Stable, continue present medications. Microalbumin is 10.  Will continue without ACE/ARB       Type 2 diabetes mellitus with hyperglycemia, without long-term current  use of insulin (HCC) - Primary    Hgb A1C is 7.2 which is down from 7.5.  Seeing podiatry and vascular and trying to get diabetic shoes      Relevant Orders   Microalbumin, Urine Waived   Comprehensive metabolic panel   Bayer DCA Hb D6U Waived       Follow up plan: Return in about 3 months (around 05/11/2019).

## 2019-02-10 NOTE — Assessment & Plan Note (Deleted)
Intolerant to Ace but worsening GFR.  Trial of ARB.  DC Chlorthalidone and rx Losartan HCTZ.  Recheck in 3 months.

## 2019-02-10 NOTE — Assessment & Plan Note (Signed)
GFRs above 60 using African American male parameter.

## 2019-02-10 NOTE — Assessment & Plan Note (Signed)
Hgb A1C is 7.2 which is down from 7.5.  Seeing podiatry and vascular and trying to get diabetic shoes

## 2019-02-10 NOTE — Assessment & Plan Note (Addendum)
Stable, continue present medications. Microalbumin is 10.  Will continue without ACE/ARB

## 2019-02-10 NOTE — Assessment & Plan Note (Signed)
At goal with Atorvastatin 20 mg.

## 2019-02-11 LAB — COMPREHENSIVE METABOLIC PANEL
ALT: 20 IU/L (ref 0–44)
AST: 16 IU/L (ref 0–40)
Albumin/Globulin Ratio: 2.7 — ABNORMAL HIGH (ref 1.2–2.2)
Albumin: 4.8 g/dL (ref 3.8–4.8)
Alkaline Phosphatase: 47 IU/L (ref 39–117)
BUN/Creatinine Ratio: 16 (ref 10–24)
BUN: 20 mg/dL (ref 8–27)
Bilirubin Total: 0.3 mg/dL (ref 0.0–1.2)
CO2: 23 mmol/L (ref 20–29)
Calcium: 9.5 mg/dL (ref 8.6–10.2)
Chloride: 99 mmol/L (ref 96–106)
Creatinine, Ser: 1.22 mg/dL (ref 0.76–1.27)
GFR calc Af Amer: 70 mL/min/{1.73_m2} (ref >59)
GFR calc non Af Amer: 61 mL/min/{1.73_m2} (ref >59)
GLUCOSE: 93 mg/dL (ref 65–99)
Globulin, Total: 1.8 g/dL (ref 1.5–4.5)
Potassium: 3.5 mmol/L (ref 3.5–5.2)
Sodium: 144 mmol/L (ref 134–144)
Total Protein: 6.6 g/dL (ref 6.0–8.5)

## 2019-02-12 ENCOUNTER — Telehealth: Payer: Self-pay | Admitting: *Deleted

## 2019-02-12 NOTE — Telephone Encounter (Signed)
I informed pt of Dr. Hyatt's review of results. 

## 2019-02-12 NOTE — Telephone Encounter (Signed)
-----   Message from Elinor Parkinson, [place] sent at 02/10/2019  7:07 AM EST ----- Arterial Blood flow looks good

## 2019-02-24 ENCOUNTER — Other Ambulatory Visit: Payer: Self-pay | Admitting: Internal Medicine

## 2019-02-25 ENCOUNTER — Ambulatory Visit: Payer: Medicare Other | Admitting: Nurse Practitioner

## 2019-02-25 ENCOUNTER — Other Ambulatory Visit: Payer: Self-pay | Admitting: *Deleted

## 2019-02-25 MED ORDER — FUROSEMIDE 20 MG PO TABS: 20.0000 mg | ORAL_TABLET | Freq: Every day | ORAL | 0 refills | Status: DC

## 2019-02-25 NOTE — Telephone Encounter (Signed)
Patient to follow up in April so we can send in refill

## 2019-02-25 NOTE — Telephone Encounter (Signed)
I can't see the appointment in April.

## 2019-02-25 NOTE — Telephone Encounter (Signed)
Patient Last seen 11/2017 .Please advise.

## 2019-02-27 ENCOUNTER — Telehealth: Payer: Self-pay | Admitting: Internal Medicine

## 2019-02-27 NOTE — Telephone Encounter (Signed)
No answer, No VM to schedule follow up with Dr Okey Dupre     Kate Sable, [place], RMA  P [facility] Scheduling        Patient needs an appointment for further refills LOV 12 /2018

## 2019-02-27 NOTE — Telephone Encounter (Signed)
Patient scheduled 3/4 with Flavia Shipper

## 2019-03-04 ENCOUNTER — Encounter: Payer: Self-pay | Admitting: Nurse Practitioner

## 2019-03-04 ENCOUNTER — Ambulatory Visit (INDEPENDENT_AMBULATORY_CARE_PROVIDER_SITE_OTHER): Payer: Medicare Other | Admitting: Nurse Practitioner

## 2019-03-04 VITALS — BP 120/70 | HR 61 | Ht 75.0 in | Wt 206.5 lb

## 2019-03-04 DIAGNOSIS — E119 Type 2 diabetes mellitus without complications: Secondary | ICD-10-CM

## 2019-03-04 DIAGNOSIS — E876 Hypokalemia: Secondary | ICD-10-CM

## 2019-03-04 DIAGNOSIS — I2510 Atherosclerotic heart disease of native coronary artery without angina pectoris: Secondary | ICD-10-CM

## 2019-03-04 DIAGNOSIS — E785 Hyperlipidemia, unspecified: Secondary | ICD-10-CM

## 2019-03-04 DIAGNOSIS — I10 Essential (primary) hypertension: Secondary | ICD-10-CM | POA: Diagnosis not present

## 2019-03-04 MED ORDER — FUROSEMIDE 20 MG PO TABS: 20.0000 mg | ORAL_TABLET | Freq: Every day | ORAL | 3 refills | Status: DC

## 2019-03-04 MED ORDER — POTASSIUM CHLORIDE ER 20 MEQ PO TBCR: 20.0000 meq | EXTENDED_RELEASE_TABLET | Freq: Every day | ORAL | 3 refills | Status: DC

## 2019-03-04 NOTE — Patient Instructions (Signed)
Medication Instructions:  Your physician has recommended you make the following change in your medication:  1- START Take 20 mEq by mouth daily.  If you need a refill on your cardiac medications before your next appointment, please call your pharmacy.   Lab work: Your physician recommends that you return to clinic for lab work in: 1 weeks on ___________ @__________AM /PM. Labs included are; BMET/MAG. We will contact you with results in 1-2 business days.   If you have labs (blood work) drawn today and your tests are completely normal, you will receive your results only by: [name] MyChart Message (if you have MyChart) OR . A paper copy in the mail If you have any lab test that is abnormal or we need to change your treatment, we will call you to review the results.  Testing/Procedures: None ordered   Follow-Up: At [facility], you and your health needs are our priority.  As part of our continuing mission to provide you with exceptional heart care, we have created designated Provider Care Teams.  These Care Teams include your primary Cardiologist (physician) and Advanced Practice Providers (APPs -  Physician Assistants and Nurse Practitioners) who all work together to provide you with the care you need, when you need it. You will need a follow up appointment in 6 months.  Please call our office 2 months in advance to schedule this appointment.  You may see Yvonne Kendall, MD or Nicolasa Ducking, NP.

## 2019-03-04 NOTE — Progress Notes (Signed)
Office Visit    Patient Name: Brendan [name] Date of Encounter: 03/04/2019  Primary Care Provider:  [name], NP Primary Cardiologist:  Yvonne Kendall, MD  Chief Complaint    68 year old male with a history of CAD status post CABG x4, hypertension, hyperlipidemia, and diabetes, who presents for follow-up related to CAD.  Past Medical History    Past Medical History:  Diagnosis Date  . CAD (coronary artery disease)    a. 05/2016 Lexi MV: EF 58%, no ischemia/infarct; b. 05/2017 Cath: LM 20-30, LAD 50p, 66m, D1 50p, D2 80ost, LCX 90/70ost, RCA 80/90p/m, RPL1 50p, EF 50-55%; c. 06/2017 CABG x 4 (LIMA->LAD, VG->D2,VG->OM, VG->PDA).  . Diabetes mellitus without complication (HCC)   . Diastolic dysfunction    a. 05/2016 Echo: EF 55%, Gr1 DD, mild TR/MR.  [name] Hyperlipidemia   . Hypertension   . Personal history of tobacco use, presenting hazards to health 05/03/2016  . Post-op Afib    a. 06/2017 following CABG->short course of amio.   Past Surgical History:  Procedure Laterality Date  . BACK SURGERY    . CORONARY ARTERY BYPASS GRAFT N/A 07/01/2017   Procedure: CORONARY ARTERY BYPASS GRAFTING (CABG) x4, using left mammary artery and right greater saphenous vein harvested endoscopically;  Surgeon: Kerin Perna, MD;  Location: [facility] OR;  Service: Open Heart Surgery;  Laterality: N/A;  . KNEE SURGERY    . LEFT HEART CATH AND CORONARY ANGIOGRAPHY N/A 06/04/2017   Procedure: Left Heart Cath and Coronary Angiography;  Surgeon: Yvonne Kendall, MD;  Location: [facility] INVASIVE CV LAB;  Service: Cardiovascular;  Laterality: N/A;  . TEE WITHOUT CARDIOVERSION N/A 07/01/2017   Procedure: TRANSESOPHAGEAL ECHOCARDIOGRAM (TEE);  Surgeon: Donata Clay, Theron Arista, MD;  Location: [facility] OR;  Service: Open Heart Surgery;  Laterality: N/A;    Allergies  Allergies  Allergen Reactions  . Lisinopril Other (See Comments)    Fatigue and ED  . No Known Allergies     History of Present Illness    67 year old male with  the above past medical history including coronary artery disease status post CABG x4 in July 2018, hypertension, hyperlipidemia, type 2 diabetes mellitus, postop A. fib, and tobacco abuse.  He was last seen in clinic in December 2018 and since then, he says he has done remarkably well.  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  This is a fairly busy job with frequent exertion and he does not experience chest pain or dyspnea.  He has had some right lower extremity swelling ever since his bypass surgery but overall, he has been doing well.  He denies palpitations, PND, orthopnea, dizziness, syncope, or early satiety.  Home Medications    Prior to Admission medications   Medication Sig Start Date End Date Taking? Authorizing Provider  acetaminophen (TYLENOL) 500 MG tablet Take 1 tablet (500 mg total) by mouth every 6 (six) hours as needed for mild pain or headache. 07/09/17  Yes Doree Fudge M, PA-C  albuterol (PROVENTIL HFA;VENTOLIN HFA) 108 (90 Base) MCG/ACT inhaler Inhale 2 puffs into the lungs every 6 (six) hours as needed for wheezing or shortness of breath. 09/17/16  Yes [name], NP  amLODipine (NORVASC) 5 MG tablet Take 1 tablet (5 mg total) by mouth daily. 04/14/18  Yes [name], NP  aspirin EC 81 MG tablet Take 1 tablet (81 mg total) by mouth daily. 12/19/17  Yes End, Cristal Deer, MD  atorvastatin (LIPITOR) 20 MG tablet Take 1 tablet (20 mg  total) by mouth daily. 11/24/18  Yes [name], [name], NP  azelastine (ASTELIN) 0.1 % nasal spray Place 2 sprays into both nostrils at bedtime.  02/15/17  Yes [provider]  chlorthalidone (HYGROTON) 25 MG tablet Take 1 tablet (25 mg total) by mouth daily. 11/24/18  Yes [name], [name], NP  furosemide (LASIX) 20 MG tablet Take 1 tablet (20 mg total) by mouth daily. 02/25/19  Yes End, Cristal Deer, MD  loratadine (CLARITIN) 10 MG tablet Take 1 tablet (10 mg total) by mouth daily. 12/15/18  Yes [name], Corrie Dandy T, NP   metFORMIN (GLUCOPHAGE) 1000 MG tablet Take 1 tablet (1,000 mg total) by mouth 2 (two) times daily with a meal. 12/26/18  Yes [name], [name], NP  metoprolol tartrate (LOPRESSOR) 25 MG tablet Take 0.5 tablets (12.5 mg total) by mouth 2 (two) times daily. 11/24/18  Yes [name], [name], NP  montelukast (SINGULAIR) 10 MG tablet Take 1 tablet (10 mg total) by mouth at bedtime. 11/24/18  Yes [name], [name], NP  sildenafil (REVATIO) 20 MG tablet TAKE 1 TO 5 TABLETS AS NEEDED. 12/25/18  Yes [name], [name], NP  tamsulosin (FLOMAX) 0.4 MG CAPS capsule Take 1 capsule (0.4 mg total) by mouth daily. 11/24/18  Yes Aura Dials T, NP    Review of Systems    Mild right lower extremity swelling since his bypass surgery.  He denies chest pain, palpitations, dyspnea, pnd, orthopnea, n, v, dizziness, syncope, weight gain, or early satiety.  All other systems reviewed and are otherwise negative except as noted above.  Physical Exam    VS:  BP 120/70 (BP Location: Left Arm, Patient Position: Sitting, Cuff Size: Normal)   Pulse 61   Ht 6\' 3"  (1.905 m)   Wt 206 lb 8 oz (93.7 kg)   BMI 25.81 kg/m  , BMI Body mass index is 25.81 kg/m. GEN: Well nourished, well developed, in no acute distress. HEENT: normal. Neck: Supple, no JVD, carotid bruits, or masses. Cardiac: RRR, no murmurs, rubs, or gallops. No clubbing, cyanosis, trace r ankle edema.  Radials/PT 2+ and equal bilaterally.  Respiratory:  Respirations regular and unlabored, clear to auscultation bilaterally. GI: Soft, nontender, nondistended, BS + x 4. MS: no deformity or atrophy. Skin: warm and dry, no rash. Neuro:  Strength and sensation are intact. Psych: Normal affect.  Accessory Clinical Findings    ECG personally reviewed by me today -regular sinus rhythm, 61- no acute changes.  Lab Results  Component Value Date   CREATININE 1.22 02/10/2019   BUN 20 02/10/2019   NA 144 02/10/2019   K 3.5 02/10/2019   CL 99 02/10/2019   CO2  23 02/10/2019   Lab Results  Component Value Date   ALT 20 02/10/2019   AST 16 02/10/2019   ALKPHOS 47 02/10/2019   BILITOT 0.3 02/10/2019   Lab Results  Component Value Date   CHOL 105 08/21/2018   HDL 30 (L) 08/21/2018   LDLCALC 47 08/21/2018   TRIG 139 08/21/2018   CHOLHDL 3.5 08/21/2018    Assessment & Plan    1.  Coronary artery disease: Status post CABG x4 in 2018.  He has been doing well and remains active without any chest pain, dyspnea, or limitations in activity.  He remains on aspirin, statin, beta-blocker therapy.  2.  Essential hypertension: Stable at 120/70.  He remains on calcium channel blocker, beta-blocker, and diuretic therapy.  3.  Hyperlipidemia: LDL of 47 in August 2019 with normal LFTs last  month.  Continue statin therapy.  4.  Type 2 diabetes mellitus: A1c was 7.4 in August.  He remains on metformin therapy and this is managed by primary care.  Continue statin.  Consider ACE inhibitor.  5.  Hypokalemia: Potassium recently 3.5.  He is on both Lasix and chlorthalidone and seems to be tolerating both.  In review of historical potassiums, he is never any higher than 3.5 and has been as low as 3.2 in August 2019.  I am adding potassium chloride 20 mill equivalents daily and have asked him to come back for a follow-up basic metabolic panel and magnesium in 1 week.  6.  Disposition: Follow-up basic metabolic panel and magnesium in 1 week.  Follow-up in clinic in 6 months or sooner if necessary.  Nicolasa Ducking , NP 03/04/2019, 4:37 PM

## 2019-03-11 ENCOUNTER — Other Ambulatory Visit (INDEPENDENT_AMBULATORY_CARE_PROVIDER_SITE_OTHER): Payer: Medicare Other

## 2019-03-11 ENCOUNTER — Other Ambulatory Visit: Payer: Self-pay

## 2019-03-11 DIAGNOSIS — I2510 Atherosclerotic heart disease of native coronary artery without angina pectoris: Secondary | ICD-10-CM | POA: Diagnosis not present

## 2019-03-12 ENCOUNTER — Telehealth: Payer: Self-pay | Admitting: *Deleted

## 2019-03-12 LAB — BASIC METABOLIC PANEL
BUN/Creatinine Ratio: 17 (ref 10–24)
BUN: 20 mg/dL (ref 8–27)
CO2: 25 mmol/L (ref 20–29)
CREATININE: 1.21 mg/dL (ref 0.76–1.27)
Calcium: 10.1 mg/dL (ref 8.6–10.2)
Chloride: 99 mmol/L (ref 96–106)
GFR calc Af Amer: 71 mL/min/{1.73_m2} (ref >59)
GFR calc non Af Amer: 62 mL/min/{1.73_m2} (ref >59)
GLUCOSE: 96 mg/dL (ref 65–99)
Potassium: 3.6 mmol/L (ref 3.5–5.2)
Sodium: 143 mmol/L (ref 134–144)

## 2019-03-12 LAB — MAGNESIUM: Magnesium: 1.8 mg/dL (ref 1.6–2.3)

## 2019-03-12 NOTE — Telephone Encounter (Signed)
No answer. PHone rang several times and no answer or VM.

## 2019-03-12 NOTE — Telephone Encounter (Signed)
-----   Message from Creig Hines, NP sent at 03/12/2019 10:27 AM EDT ----- Renal fxn stable.  K low-nl @ 3.6.  Please increase kdur to daily.

## 2019-03-18 MED ORDER — POTASSIUM CHLORIDE ER 20 MEQ PO TBCR: EXTENDED_RELEASE_TABLET | ORAL | 1 refills | Status: DC

## 2019-03-18 NOTE — Telephone Encounter (Signed)
No answer after multiple rings & no VM. Will try the patient back at a later time.

## 2019-03-18 NOTE — Telephone Encounter (Signed)
I spoke with the patient. He is aware of his BMP/ Magnesium results. I have advised him of Ward Givens, NP's recommendations to increase potassium to 40 meq once daily.  The patient voices understanding and is agreeable.  RX updated at [facility] in [place].

## 2019-03-19 ENCOUNTER — Telehealth: Payer: Self-pay

## 2019-03-19 NOTE — Telephone Encounter (Signed)
Called to move medicare wellness visit, can see if patient could come in Monday 03/23/2019 at 11:00, if this time slot doesn't work please  Fwd call to Yuleni Burich,LPN at [facility]. Number [phone]

## 2019-03-24 NOTE — Telephone Encounter (Signed)
Spoke with the patient and rescheduled his AWV appointment for 03/26/2019 at 1:45 PM.  Patient gave authorization to do the AWV telephonically and to call the phone number [phone] for this visit.  Trixie Rude, Care Guide.

## 2019-03-26 ENCOUNTER — Ambulatory Visit (INDEPENDENT_AMBULATORY_CARE_PROVIDER_SITE_OTHER): Payer: Medicare Other

## 2019-03-26 ENCOUNTER — Ambulatory Visit: Payer: Self-pay

## 2019-03-26 ENCOUNTER — Other Ambulatory Visit: Payer: Self-pay

## 2019-03-26 DIAGNOSIS — Z0000 Encounter for general adult medical examination without abnormal findings: Secondary | ICD-10-CM | POA: Diagnosis not present

## 2019-03-26 NOTE — Progress Notes (Addendum)
Subjective:   Brendan [name] is a 68 y.o. male who presents for Medicare Annual/Subsequent preventive examination.  This visit is being conducted via telephone due to the COVID-19 pandemic. This patient has given me verbal consent via telephone to conduct this visit. Some vital signs may be absent or patient reported.  Patient identification: identified by name, DOB, and current address.    Review of Systems:   Cardiac Risk Factors include: advanced age (>56men, >40 women);male gender;dyslipidemia;diabetes mellitus;hypertension;smoking/ tobacco exposure     Objective:    Vitals: There were no vitals taken for this visit.  There is no height or weight on file to calculate BMI.  Advanced Directives 03/26/2019 03/20/2018 07/02/2017 06/18/2017 06/04/2017 03/20/2017 03/19/2017  Does Patient Have a Medical Advance Directive? No No No No No No No  Would patient like information on creating a medical advance directive? Yes (MAU/Ambulatory/Procedural Areas - Information given) Yes (MAU/Ambulatory/Procedural Areas - Information given) No - Patient declined - No - Patient declined Yes (MAU/Ambulatory/Procedural Areas - Information given) No - Patient declined    Tobacco Social History   Tobacco Use  Smoking Status Former Smoker  . Packs/day: 1.00  . Years: 60.00  . Pack years: 60.00  . Types: Cigarettes  . Last attempt to quit: 06/29/2017  . Years since quitting: 1.7  Smokeless Tobacco Never Used     Counseling given: Not Answered   Clinical Intake:  Pre-visit preparation completed: Yes  Pain : No/denies pain     Nutritional Status: BMI 25 -29 Overweight Nutritional Risks: None Diabetes: Yes CBG done?: No Did pt. bring in CBG monitor from home?: No  How often do you need to have someone help you when you read instructions, pamphlets, or other written materials from your doctor or pharmacy?: 1 - Never  Nutrition Risk Assessment:  Has the patient had any N/V/D within the last 2  months?  No  Does the patient have any non-healing wounds?  No  Has the patient had any unintentional weight loss or weight gain?  No   Diabetes:  Is the patient diabetic?  Yes  If diabetic, was a CBG obtained today?  No  Did the patient bring in their glucometer from home?  No  How often do you monitor your CBG's? Daily to every 2 days .   Financial Strains and Diabetes Management:  Are you having any financial strains with the device, your supplies or your medication? No .  Does the patient want to be seen by Chronic Care Management for management of their diabetes?  No  Would the patient like to be referred to a Nutritionist or for Diabetic Management?  No   Diabetic Exams:  Diabetic Eye Exam: Completed 10/2018 pr patient at [facility], [place], will call for results.  Diabetic Foot Exam: Completed 02/10/2019. Pt has been advised about the importance in completing this exam.   Interpreter Needed?: No  Information entered by :: Tiffany Hill,LPN   Past Medical History:  Diagnosis Date  . CAD (coronary artery disease)    a. 05/2016 Lexi MV: EF 58%, no ischemia/infarct; b. 05/2017 Cath: LM 20-30, LAD 50p, 64m, D1 50p, D2 80ost, LCX 90/70ost, RCA 80/90p/m, RPL1 50p, EF 50-55%; c. 06/2017 CABG x 4 (LIMA->LAD, VG->D2,VG->OM, VG->PDA).  . Diabetes mellitus without complication (HCC)   . Diastolic dysfunction    a. 05/2016 Echo: EF 55%, Gr1 DD, mild TR/MR.  [name] Hyperlipidemia   . Hypertension   . Hypokalemia   . Personal history of tobacco use,  presenting hazards to health 05/03/2016  . Post-op Afib    a. 06/2017 following CABG->short course of amio.   Past Surgical History:  Procedure Laterality Date  . BACK SURGERY    . CORONARY ARTERY BYPASS GRAFT N/A 07/01/2017   Procedure: CORONARY ARTERY BYPASS GRAFTING (CABG) x4, using left mammary artery and right greater saphenous vein harvested endoscopically;  Surgeon: Kerin Perna, MD;  Location: [facility] OR;  Service: Open Heart Surgery;  Laterality:  N/A;  . KNEE SURGERY    . LEFT HEART CATH AND CORONARY ANGIOGRAPHY N/A 06/04/2017   Procedure: Left Heart Cath and Coronary Angiography;  Surgeon: Yvonne Kendall, MD;  Location: [facility] INVASIVE CV LAB;  Service: Cardiovascular;  Laterality: N/A;  . TEE WITHOUT CARDIOVERSION N/A 07/01/2017   Procedure: TRANSESOPHAGEAL ECHOCARDIOGRAM (TEE);  Surgeon: Donata Clay, Theron Arista, MD;  Location: [facility] OR;  Service: Open Heart Surgery;  Laterality: N/A;   Family History  Problem Relation Age of Onset  . Hypertension Mother   . Diabetes Mother   . Heart disease Father 63       multiple MI's   Social History   Socioeconomic History  . Marital status: Widowed    Spouse name: Not on file  . Number of children: Not on file  . Years of education: Not on file  . Highest education level: 10th grade  Occupational History  . Occupation: works part time as Pharmacist, hospital  . Financial resource strain: Not hard at all  . Food insecurity:    Worry: Never true    Inability: Never true  . Transportation needs:    Medical: No    Non-medical: No  Tobacco Use  . Smoking status: Former Smoker    Packs/day: 1.00    Years: 60.00    Pack years: 60.00    Types: Cigarettes    Last attempt to quit: 06/29/2017    Years since quitting: 1.7  . Smokeless tobacco: Never Used  Substance and Sexual Activity  . Alcohol use: No    Comment: previously drank heavily but quit ~ 21 yrs ago.  . Drug use: No  . Sexual activity: Yes  Lifestyle  . Physical activity:    Days per week: 0 days    Minutes per session: 0 min  . Stress: Not at all  Relationships  . Social connections:    Talks on phone: Never    Gets together: More than three times a week    Attends religious service: 1 to 4 times per year    Active member of club or organization: No    Attends meetings of clubs or organizations: Never    Relationship status: Widowed  Other Topics Concern  . Not on file  Social History Narrative   Lives in [place] by  himself.  Works as Copy.  Does not routinely exercise.    Outpatient Encounter Medications as of 03/26/2019  Medication Sig  . acetaminophen (TYLENOL) 500 MG tablet Take 1 tablet (500 mg total) by mouth every 6 (six) hours as needed for mild pain or headache.  . albuterol (PROVENTIL HFA;VENTOLIN HFA) 108 (90 Base) MCG/ACT inhaler Inhale 2 puffs into the lungs every 6 (six) hours as needed for wheezing or shortness of breath.  [name] amLODipine (NORVASC) 5 MG tablet Take 1 tablet (5 mg total) by mouth daily.  [name] aspirin EC 81 MG tablet Take 1 tablet (81 mg total) by mouth daily.  [name] atorvastatin (LIPITOR) 20 MG tablet Take 1 tablet (20  mg total) by mouth daily.  [name] azelastine (ASTELIN) 0.1 % nasal spray Place 2 sprays into both nostrils at bedtime.   . chlorthalidone (HYGROTON) 25 MG tablet Take 1 tablet (25 mg total) by mouth daily.  . furosemide (LASIX) 20 MG tablet Take 1 tablet (20 mg total) by mouth daily.  [name] loratadine (CLARITIN) 10 MG tablet Take 1 tablet (10 mg total) by mouth daily.  . metFORMIN (GLUCOPHAGE) 1000 MG tablet Take 1 tablet (1,000 mg total) by mouth 2 (two) times daily with a meal.  . metoprolol tartrate (LOPRESSOR) 25 MG tablet Take 0.5 tablets (12.5 mg total) by mouth 2 (two) times daily.  . montelukast (SINGULAIR) 10 MG tablet Take 1 tablet (10 mg total) by mouth at bedtime.  . Potassium Chloride ER 20 MEQ TBCR Take 2 tablets (40 meq) by mouth once daily  . sildenafil (REVATIO) 20 MG tablet TAKE 1 TO 5 TABLETS AS NEEDED.  [name] tamsulosin (FLOMAX) 0.4 MG CAPS capsule Take 1 capsule (0.4 mg total) by mouth daily.   No facility-administered encounter medications on file as of 03/26/2019.     Activities of Daily Living In your present state of health, do you have any difficulty performing the following activities: 03/26/2019 05/13/2018  Hearing? Malvin Johns  Vision? N N  Difficulty concentrating or making decisions? N N  Walking or climbing stairs? N Y  Dressing or bathing? N N  Doing  errands, shopping? N N  Preparing Food and eating ? N -  Using the Toilet? N -  In the past six months, have you accidently leaked urine? N -  Do you have problems with loss of bowel control? N -  Managing your Medications? N -  Managing your Finances? N -  Housekeeping or managing your Housekeeping? N -  Some recent data might be hidden    Patient Care Team: Marjie Skiff, NP as PCP - General (Nurse Practitioner) End, Cristal Deer, MD as PCP - Cardiology (Cardiology) End, Cristal Deer, MD as Consulting Physician (Cardiology) Donata Clay, Theron Arista, MD as Consulting Physician (Cardiothoracic Surgery)   Assessment:   This is a routine wellness examination for Brendan [name].  Exercise Activities and Dietary recommendations Current Exercise Habits: The patient does not participate in regular exercise at present, Exercise limited by: None identified  Goals    . DIET - INCREASE WATER INTAKE     Recommend drinking at least 6-8 glasses of water a day     . Quit smoking / using tobacco       Fall Risk Fall Risk  03/26/2019 05/13/2018 03/20/2018 03/19/2017 06/05/2016  Falls in the past year? 0 No No No No   FALL RISK PREVENTION PERTAINING TO THE HOME:  Any stairs in or around the home? No  If so, are there any without handrails? n/a  Home free of loose throw rugs in walkways, pet beds, electrical cords, etc? Yes  Adequate lighting in your home to reduce risk of falls? Yes   ASSISTIVE DEVICES UTILIZED TO PREVENT FALLS:  Life alert? No  Use of a cane, walker or w/c? No  Grab bars in the bathroom? No  Shower chair or bench in shower? No  Elevated toilet seat or a handicapped toilet? No    TIMED UP AND GO:  Was the test performed? No .  Unable to perform    Depression Screen PHQ 2/9 Scores 03/26/2019 05/13/2018 03/20/2018 03/19/2017  PHQ - 2 Score 0 0 0 0  PHQ- 9 Score - - - 1  Cognitive Function     6CIT Screen 03/26/2019 03/20/2018  What Year? 0 points 0 points  What month? 0  points 0 points  What time? 0 points 0 points  Count back from 20 0 points 0 points  Months in reverse 0 points 0 points  Repeat phrase 2 points 2 points  Total Score 2 2    Immunization History  Administered Date(s) Administered  . Influenza, High Dose Seasonal PF 09/17/2016, 11/20/2017, 11/24/2018  . Influenza,inj,Quad PF,6+ Mos 11/30/2015  . Influenza-Unspecified 11/11/2013, 11/30/2015  . Pneumococcal Conjugate-13 05/10/2014  . Pneumococcal Polysaccharide-23 09/15/2007, 03/19/2017  . Td 08/16/2005  . Zoster 07/31/2011    Qualifies for Shingles Vaccine? Yes  Zostavax completed 07/31/2011. Due for Shingrix. Education has been provided regarding the importance of this vaccine. Pt has been advised to call insurance company to determine out of pocket expense. Advised may also receive vaccine at local pharmacy or Health Dept. Verbalized acceptance and understanding.   Tdap: due,  Education has been provided regarding the importance of this vaccine. Advised may receive this vaccine at local pharmacy or Health Dept. Aware to provide a copy of the vaccination record if obtained from local pharmacy or Health Dept. Verbalized acceptance and understanding.  Flu Vaccine: up to date   Pneumococcal Vaccine: up to date   Screening Tests Health Maintenance  Topic Date Due  . OPHTHALMOLOGY EXAM  10/28/2018  . TETANUS/TDAP  12/27/2019 (Originally 08/17/2015)  . HEMOGLOBIN A1C  08/11/2019  . FOOT EXAM  02/11/2020  . URINE MICROALBUMIN  02/11/2020  . COLONOSCOPY  04/15/2024  . INFLUENZA VACCINE  Completed  . Hepatitis C Screening  Completed  . PNA vac Low Risk Adult  Completed   Cancer Screenings:  Colorectal Screening: Completed 04/05/2014. Repeat every 10 years  Lung Cancer Screening: (Low Dose CT Chest recommended if Age 71-80 years, 30 pack-year currently smoking OR have quit w/in 15years.) does qualify. Completed 05/28/2018   Additional Screening:  Hepatitis C Screening: does qualify;  Completed 11/30/2015  Dental Screening: Recommended annual dental exams for proper oral hygiene  Community Resource Referral:  CRR required this visit?  No        Plan:  I have personally reviewed and addressed the Medicare Annual Wellness questionnaire and have noted the following in the patient's chart:  A. Medical and social history B. Use of alcohol, tobacco or illicit drugs  C. Current medications and supplements D. Functional ability and status E.  Nutritional status F.  Physical activity G. Advance directives H. List of other physicians I.  Hospitalizations, surgeries, and ER visits in previous 12 months J.  Vitals K. Screenings such as hearing and vision if needed, cognitive and depression L. Referrals and appointments   In addition, I have reviewed and discussed with patient certain preventive protocols, quality metrics, and best practice recommendations. A written personalized care plan for preventive services as well as general preventive health recommendations were provided to patient.   Signed,   Collene Schlichter, LPN  [id number] Nurse Health Advisor  Nurse Notes:  Patient states he requested diabetic shoes when he was at [facility] and was told he needed something from his pcp before he could get it, states he came in for an appt with [name] in Feb but hasnt heard anything about the shoes since then. I will send Ria Clock an in-basket inquiring about the diabetic shoes.

## 2019-03-26 NOTE — Patient Instructions (Signed)
Brendan [name] , Thank you for taking time to come for your Medicare Wellness Visit. I appreciate your ongoing commitment to your health goals. Please review the following plan we discussed and let me know if I can assist you in the future.   Screening recommendations/referrals: Colonoscopy: completed 04/05/2014 Recommended yearly ophthalmology/optometry visit for glaucoma screening and checkup Recommended yearly dental visit for hygiene and checkup  Vaccinations: Influenza vaccine: up to date  Pneumococcal vaccine: up to date Tdap vaccine: due, check with your insurance company for coverage Shingles vaccine: shingrix eligible, check with your insurance company for coverage     Advanced directives: Advance directive discussed with you today.   Conditions/risks identified: diabetic.   Next appointment: Follow up in one year for your annual wellness exam.   Preventive Care 65 Years and Older, Male Preventive care refers to lifestyle choices and visits with your health care provider that can promote health and wellness. What does preventive care include?  A yearly physical exam. This is also called an annual well check.  Dental exams once or twice a year.  Routine eye exams. Ask your health care provider how often you should have your eyes checked.  Personal lifestyle choices, including:  Daily care of your teeth and gums.  Regular physical activity.  Eating a healthy diet.  Avoiding tobacco and drug use.  Limiting alcohol use.  Practicing safe sex.  Taking low doses of aspirin every day.  Taking vitamin and mineral supplements as recommended by your health care provider. What happens during an annual well check? The services and screenings done by your health care provider during your annual well check will depend on your age, overall health, lifestyle risk factors, and family history of disease. Counseling  Your health care provider may ask you questions about your:   Alcohol use.  Tobacco use.  Drug use.  Emotional well-being.  Home and relationship well-being.  Sexual activity.  Eating habits.  History of falls.  Memory and ability to understand (cognition).  Work and work Astronomer. Screening  You may have the following tests or measurements:  Height, weight, and BMI.  Blood pressure.  Lipid and cholesterol levels. These may be checked every 5 years, or more frequently if you are over 38 years old.  Skin check.  Lung cancer screening. You may have this screening every year starting at age 67 if you have a 30-pack-year history of smoking and currently smoke or have quit within the past 15 years.  Fecal occult blood test (FOBT) of the stool. You may have this test every year starting at age 57.  Flexible sigmoidoscopy or colonoscopy. You may have a sigmoidoscopy every 5 years or a colonoscopy every 10 years starting at age 82.  Prostate cancer screening. Recommendations will vary depending on your family history and other risks.  Hepatitis C blood test.  Hepatitis B blood test.  Sexually transmitted disease (STD) testing.  Diabetes screening. This is done by checking your blood sugar (glucose) after you have not eaten for a while (fasting). You may have this done every 1-3 years.  Abdominal aortic aneurysm (AAA) screening. You may need this if you are a current or former smoker.  Osteoporosis. You may be screened starting at age 92 if you are at high risk. Talk with your health care provider about your test results, treatment options, and if necessary, the need for more tests. Vaccines  Your health care provider may recommend certain vaccines, such as:  Influenza vaccine. This is  recommended every year.  Tetanus, diphtheria, and acellular pertussis (Tdap, Td) vaccine. You may need a Td booster every 10 years.  Zoster vaccine. You may need this after age 42.  Pneumococcal 13-valent conjugate (PCV13) vaccine. One dose is  recommended after age 42.  Pneumococcal polysaccharide (PPSV23) vaccine. One dose is recommended after age 57. Talk to your health care provider about which screenings and vaccines you need and how often you need them. This information is not intended to replace advice given to you by your health care provider. Make sure you discuss any questions you have with your health care provider. Document Released: 01/13/2016 Document Revised: 09/05/2016 Document Reviewed: 10/18/2015 Elsevier Interactive Patient Education  2017 [place] Prevention in the Home Falls can cause injuries. They can happen to people of all ages. There are many things you can do to make your home safe and to help prevent falls. What can I do on the outside of my home?  Regularly fix the edges of walkways and driveways and fix any cracks.  Remove anything that might make you trip as you walk through a door, such as a raised step or threshold.  Trim any bushes or trees on the path to your home.  Use bright outdoor lighting.  Clear any walking paths of anything that might make someone trip, such as rocks or tools.  Regularly check to see if handrails are loose or broken. Make sure that both sides of any steps have handrails.  Any raised decks and porches should have guardrails on the edges.  Have any leaves, snow, or ice cleared regularly.  Use sand or salt on walking paths during winter.  Clean up any spills in your garage right away. This includes oil or grease spills. What can I do in the bathroom?  Use night lights.  Install grab bars by the toilet and in the tub and shower. Do not use towel bars as grab bars.  Use non-skid mats or decals in the tub or shower.  If you need to sit down in the shower, use a plastic, non-slip stool.  Keep the floor dry. Clean up any water that spills on the floor as soon as it happens.  Remove soap buildup in the tub or shower regularly.  Attach bath mats  securely with double-sided non-slip rug tape.  Do not have throw rugs and other things on the floor that can make you trip. What can I do in the bedroom?  Use night lights.  Make sure that you have a light by your bed that is easy to reach.  Do not use any sheets or blankets that are too big for your bed. They should not hang down onto the floor.  Have a firm chair that has side arms. You can use this for support while you get dressed.  Do not have throw rugs and other things on the floor that can make you trip. What can I do in the kitchen?  Clean up any spills right away.  Avoid walking on wet floors.  Keep items that you use a lot in easy-to-reach places.  If you need to reach something above you, use a strong step stool that has a grab bar.  Keep electrical cords out of the way.  Do not use floor polish or wax that makes floors slippery. If you must use wax, use non-skid floor wax.  Do not have throw rugs and other things on the floor that can make you trip.  What can I do with my stairs?  Do not leave any items on the stairs.  Make sure that there are handrails on both sides of the stairs and use them. Fix handrails that are broken or loose. Make sure that handrails are as long as the stairways.  Check any carpeting to make sure that it is firmly attached to the stairs. Fix any carpet that is loose or worn.  Avoid having throw rugs at the top or bottom of the stairs. If you do have throw rugs, attach them to the floor with carpet tape.  Make sure that you have a light switch at the top of the stairs and the bottom of the stairs. If you do not have them, ask someone to add them for you. What else can I do to help prevent falls?  Wear shoes that:  Do not have high heels.  Have rubber bottoms.  Are comfortable and fit you well.  Are closed at the toe. Do not wear sandals.  If you use a stepladder:  Make sure that it is fully opened. Do not climb a closed  stepladder.  Make sure that both sides of the stepladder are locked into place.  Ask someone to hold it for you, if possible.  Clearly mark and make sure that you can see:  Any grab bars or handrails.  First and last steps.  Where the edge of each step is.  Use tools that help you move around (mobility aids) if they are needed. These include:  Canes.  Walkers.  Scooters.  Crutches.  Turn on the lights when you go into a dark area. Replace any light bulbs as soon as they burn out.  Set up your furniture so you have a clear path. Avoid moving your furniture around.  If any of your floors are uneven, fix them.  If there are any pets around you, be aware of where they are.  Review your medicines with your doctor. Some medicines can make you feel dizzy. This can increase your chance of falling. Ask your doctor what other things that you can do to help prevent falls. This information is not intended to replace advice given to you by your health care provider. Make sure you discuss any questions you have with your health care provider. Document Released: 10/13/2009 Document Revised: 05/24/2016 Document Reviewed: 01/21/2015 Elsevier Interactive Patient Education  2017 [employer].

## 2019-03-27 ENCOUNTER — Ambulatory Visit: Payer: Self-pay

## 2019-03-31 ENCOUNTER — Other Ambulatory Visit: Payer: Self-pay | Admitting: Unknown Physician Specialty

## 2019-04-06 ENCOUNTER — Encounter: Payer: Self-pay | Admitting: *Deleted

## 2019-05-12 ENCOUNTER — Ambulatory Visit (INDEPENDENT_AMBULATORY_CARE_PROVIDER_SITE_OTHER): Payer: Medicare Other | Admitting: Nurse Practitioner

## 2019-05-12 ENCOUNTER — Other Ambulatory Visit: Payer: Self-pay

## 2019-05-12 ENCOUNTER — Encounter: Payer: Self-pay | Admitting: Nurse Practitioner

## 2019-05-12 VITALS — BP 144/84 | HR 60 | Temp 97.8°F | Ht 75.0 in | Wt 206.0 lb

## 2019-05-12 DIAGNOSIS — E1169 Type 2 diabetes mellitus with other specified complication: Secondary | ICD-10-CM | POA: Diagnosis not present

## 2019-05-12 DIAGNOSIS — E785 Hyperlipidemia, unspecified: Secondary | ICD-10-CM | POA: Diagnosis not present

## 2019-05-12 DIAGNOSIS — E1165 Type 2 diabetes mellitus with hyperglycemia: Secondary | ICD-10-CM

## 2019-05-12 DIAGNOSIS — I119 Hypertensive heart disease without heart failure: Secondary | ICD-10-CM

## 2019-05-12 LAB — BAYER DCA HB A1C WAIVED: HB A1C (BAYER DCA - WAIVED): 7 % — ABNORMAL HIGH (ref <7.0)

## 2019-05-12 NOTE — Assessment & Plan Note (Signed)
Chronic, ongoing.  Continue current medication regimen.   Lipid panel today and adjust dose as needed. 

## 2019-05-12 NOTE — Assessment & Plan Note (Signed)
Chronic, ongoing.  A1C continues downward trend at 7.0% today. Continue current medication regimen.  Return in 3 months for f/u and foot exam.

## 2019-05-12 NOTE — Patient Instructions (Signed)
DASH Eating Plan  DASH stands for "Dietary Approaches to Stop Hypertension." The DASH eating plan is a healthy eating plan that has been shown to reduce high blood pressure (hypertension). It may also reduce your risk for type 2 diabetes, heart disease, and stroke. The DASH eating plan may also help with weight loss.  What are tips for following this plan?    General guidelines   Avoid eating more than 2,300 mg (milligrams) of salt (sodium) a day. If you have hypertension, you may need to reduce your sodium intake to 1,500 mg a day.   Limit alcohol intake to no more than 1 drink a day for nonpregnant women and 2 drinks a day for men. One drink equals 12 oz of beer, 5 oz of wine, or 1 oz of hard liquor.   Work with your health care provider to maintain a healthy body weight or to lose weight. Ask what an ideal weight is for you.   Get at least 30 minutes of exercise that causes your heart to beat faster (aerobic exercise) most days of the week. Activities may include walking, swimming, or biking.   Work with your health care provider or diet and nutrition specialist (dietitian) to adjust your eating plan to your individual calorie needs.  Reading food labels     Check food labels for the amount of sodium per serving. Choose foods with less than 5 percent of the Daily Value of sodium. Generally, foods with less than 300 mg of sodium per serving fit into this eating plan.   To find whole grains, look for the word "whole" as the first word in the ingredient list.  Shopping   Buy products labeled as "low-sodium" or "no salt added."   Buy fresh foods. Avoid canned foods and premade or frozen meals.  Cooking   Avoid adding salt when cooking. Use salt-free seasonings or herbs instead of table salt or sea salt. Check with your health care provider or pharmacist before using salt substitutes.   Do not fry foods. Cook foods using healthy methods such as baking, boiling, grilling, and broiling instead.   Cook with  heart-healthy oils, such as olive, canola, soybean, or sunflower oil.  Meal planning   Eat a balanced diet that includes:  ? 5 or more servings of fruits and vegetables each day. At each meal, try to fill half of your plate with fruits and vegetables.  ? Up to 6-8 servings of whole grains each day.  ? Less than 6 oz of lean meat, poultry, or fish each day. A 3-oz serving of meat is about the same size as a deck of cards. One egg equals 1 oz.  ? 2 servings of low-fat dairy each day.  ? A serving of nuts, seeds, or beans 5 times each week.  ? Heart-healthy fats. Healthy fats called Omega-3 fatty acids are found in foods such as flaxseeds and coldwater fish, like sardines, salmon, and mackerel.   Limit how much you eat of the following:  ? Canned or prepackaged foods.  ? Food that is high in trans fat, such as fried foods.  ? Food that is high in saturated fat, such as fatty meat.  ? Sweets, desserts, sugary drinks, and other foods with added sugar.  ? Full-fat dairy products.   Do not salt foods before eating.   Try to eat at least 2 vegetarian meals each week.   Eat more home-cooked food and less restaurant, buffet, and fast food.     When eating at a restaurant, ask that your food be prepared with less salt or no salt, if possible.  What foods are recommended?  The items listed may not be a complete list. Talk with your dietitian about what dietary choices are best for you.  Grains  Whole-grain or whole-wheat bread. Whole-grain or whole-wheat pasta. Brown rice. Oatmeal. Quinoa. Bulgur. Whole-grain and low-sodium cereals. Pita bread. Low-fat, low-sodium crackers. Whole-wheat flour tortillas.  Vegetables  Fresh or frozen vegetables (raw, steamed, roasted, or grilled). Low-sodium or reduced-sodium tomato and vegetable juice. Low-sodium or reduced-sodium tomato sauce and tomato paste. Low-sodium or reduced-sodium canned vegetables.  Fruits  All fresh, dried, or frozen fruit. Canned fruit in natural juice (without  added sugar).  Meat and other protein foods  Skinless chicken or turkey. Ground chicken or turkey. Pork with fat trimmed off. Fish and seafood. Egg whites. Dried beans, peas, or lentils. Unsalted nuts, nut butters, and seeds. Unsalted canned beans. Lean cuts of beef with fat trimmed off. Low-sodium, lean deli meat.  Dairy  Low-fat (1%) or fat-free (skim) milk. Fat-free, low-fat, or reduced-fat cheeses. Nonfat, low-sodium ricotta or cottage cheese. Low-fat or nonfat yogurt. Low-fat, low-sodium cheese.  Fats and oils  Soft margarine without trans fats. Vegetable oil. Low-fat, reduced-fat, or light mayonnaise and salad dressings (reduced-sodium). Canola, safflower, olive, soybean, and sunflower oils. Avocado.  Seasoning and other foods  Herbs. Spices. Seasoning mixes without salt. Unsalted popcorn and pretzels. Fat-free sweets.  What foods are not recommended?  The items listed may not be a complete list. Talk with your dietitian about what dietary choices are best for you.  Grains  Baked goods made with fat, such as croissants, muffins, or some breads. Dry pasta or rice meal packs.  Vegetables  Creamed or fried vegetables. Vegetables in a cheese sauce. Regular canned vegetables (not low-sodium or reduced-sodium). Regular canned tomato sauce and paste (not low-sodium or reduced-sodium). Regular tomato and vegetable juice (not low-sodium or reduced-sodium). Pickles. Olives.  Fruits  Canned fruit in a light or heavy syrup. Fried fruit. Fruit in cream or butter sauce.  Meat and other protein foods  Fatty cuts of meat. Ribs. Fried meat. Bacon. Sausage. Bologna and other processed lunch meats. Salami. Fatback. Hotdogs. Bratwurst. Salted nuts and seeds. Canned beans with added salt. Canned or smoked fish. Whole eggs or egg yolks. Chicken or turkey with skin.  Dairy  Whole or 2% milk, cream, and half-and-half. Whole or full-fat cream cheese. Whole-fat or sweetened yogurt. Full-fat cheese. Nondairy creamers. Whipped toppings.  Processed cheese and cheese spreads.  Fats and oils  Butter. Stick margarine. Lard. Shortening. Ghee. Bacon fat. Tropical oils, such as coconut, palm kernel, or palm oil.  Seasoning and other foods  Salted popcorn and pretzels. Onion salt, garlic salt, seasoned salt, table salt, and sea salt. Worcestershire sauce. Tartar sauce. Barbecue sauce. Teriyaki sauce. Soy sauce, including reduced-sodium. Steak sauce. Canned and packaged gravies. Fish sauce. Oyster sauce. Cocktail sauce. Horseradish that you find on the shelf. Ketchup. Mustard. Meat flavorings and tenderizers. Bouillon cubes. Hot sauce and Tabasco sauce. Premade or packaged marinades. Premade or packaged taco seasonings. Relishes. Regular salad dressings.  Where to find more information:   National Heart, Lung, and Blood Institute: www.nhlbi.nih.gov   American Heart Association: www.heart.org  Summary   The DASH eating plan is a healthy eating plan that has been shown to reduce high blood pressure (hypertension). It may also reduce your risk for type 2 diabetes, heart disease, and stroke.   With the   DASH eating plan, you should limit salt (sodium) intake to 2,300 mg a day. If you have hypertension, you may need to reduce your sodium intake to 1,500 mg a day.   When on the DASH eating plan, aim to eat more fresh fruits and vegetables, whole grains, lean proteins, low-fat dairy, and heart-healthy fats.   Work with your health care provider or diet and nutrition specialist (dietitian) to adjust your eating plan to your individual calorie needs.  This information is not intended to replace advice given to you by your health care provider. Make sure you discuss any questions you have with your health care provider.  Document Released: 12/06/2011 Document Revised: 12/10/2016 Document Reviewed: 12/10/2016  Elsevier Interactive Patient Education  2019 [employer]

## 2019-05-12 NOTE — Progress Notes (Signed)
BP (!) 144/84   Pulse 60   Temp 97.8 F (36.6 C) (Oral)   Ht 6\' 3"  (1.905 m)   Wt 206 lb (93.4 kg)   SpO2 95%   BMI 25.75 kg/m    Subjective:    Patient ID: Brendan [name], male    DOB: Jan 09, 1951, 68 y.o.   MRN: [id number]  HPI: Brendan [name] is a 68 y.o. male  Chief Complaint  Patient presents with  . Follow-up  . Diabetes  . Hypertension  . Hyperlipidemia    . This visit was completed via telephone due to the restrictions of the COVID-19 pandemic. All issues as above were discussed and addressed but no physical exam was performed. If it was felt that the patient should be evaluated in the office, they were directed there. The patient verbally consented to this visit. Patient was unable to complete an audio/visual visit due to Lack of equipment. Due to the catastrophic nature of the COVID-19 pandemic, this visit was done through audio contact only. . Location of the patient: home . Location of the provider: home . Those involved with this call:  . Provider: Aura DialsJolene [name], DNP . CMA: Wilhemena DurieBrittany Russell, CMA . Front Desk/Registration: Harriet PhoJoliza Johnson  . Time spent on call: 15 minutes on the phone discussing health concerns. 10 minutes total spent in review of patient's record and preparation of their chart. I verified patient identity using two factors (patient name and date of birth). Patient consents verbally to being seen via telemedicine visit today.   DIABETES Last A1C 7.2% and today 7.0%.  Continues on Metformin 1000 MG BID.   Hypoglycemic episodes:no Polydipsia/polyuria: no Visual disturbance: no Chest pain: no Paresthesias: no Glucose Monitoring: yes  Accucheck frequency: Daily  Fasting glucose:   Post prandial:  Evening: today at 1 PM was 109 -- 90to 100 range around 1 PM on average  Before meals: Taking Insulin?: no  Long acting insulin:  Short acting insulin: Blood Pressure Monitoring: not checking Retinal Examination: Up to date -- November 2019 Foot Exam:  Up to Date Pneumovax: Up to Date Influenza: Up to Date Aspirin: yes   HYPERTENSION / HYPERLIPIDEMIA Continues on Metoprolol, Lasix, Hygroton, Amlodipine, and Lipitor. Followed by cardiology and last seen 03/04/2019 by NP Brion AlimentBerge.  Reviewed note, they added Potassium Chloride daily due to borderline low K+ and double diuretic therapy.  Allergic to Lisinopril, he reports it made him "very fatigued and I felt bad".  Discussed with him possibly trial of ARB in upcoming months for kidney protection with T2DM and BP control.   Satisfied with current treatment? yes Duration of hypertension: chronic BP monitoring frequency: not checking BP range:  BP medication side effects: no Duration of hyperlipidemia: chronic Cholesterol medication side effects: no Cholesterol supplements: none Medication compliance: good compliance Aspirin: yes Recent stressors: no Recurrent headaches: no Visual changes: no Palpitations: no Dyspnea: no Chest pain: no Lower extremity edema: no Dizzy/lightheaded: no    Relevant past medical, surgical, family and social history reviewed and updated as indicated. Interim medical history since our last visit reviewed. Allergies and medications reviewed and updated.  Review of Systems  Constitutional: Negative for activity change, diaphoresis, fatigue and fever.  Respiratory: Negative for cough, chest tightness, shortness of breath and wheezing.   Cardiovascular: Negative for chest pain, palpitations and leg swelling.  Gastrointestinal: Negative for abdominal distention, abdominal pain, constipation, diarrhea, nausea and vomiting.  Endocrine: Negative for cold intolerance, heat intolerance, polydipsia, polyphagia and polyuria.  Musculoskeletal: Negative.  Skin: Negative.   Neurological: Negative for dizziness, syncope, weakness, light-headedness, numbness and headaches.  Psychiatric/Behavioral: Negative.     Per HPI unless specifically indicated above     Objective:     BP (!) 144/84   Pulse 60   Temp 97.8 F (36.6 C) (Oral)   Ht 6\' 3"  (1.905 m)   Wt 206 lb (93.4 kg)   SpO2 95%   BMI 25.75 kg/m   Wt Readings from Last 3 Encounters:  05/12/19 206 lb (93.4 kg)  03/04/19 206 lb 8 oz (93.7 kg)  02/10/19 206 lb 3.2 oz (93.5 kg)    Physical Exam   Unable to perform due to telephone visit only.  Results for orders placed or performed in visit on 03/11/19  Basic metabolic panel  Result Value Ref Range   Glucose 96 65 - 99 mg/dL   BUN 20 8 - 27 mg/dL   Creatinine, Ser 0.96 0.76 - 1.27 mg/dL   GFR calc non Af Amer 62 >59 mL/min/1.73   GFR calc Af Amer 71 >59 mL/min/1.73   BUN/Creatinine Ratio 17 10 - 24   Sodium 143 134 - 144 mmol/L   Potassium 3.6 3.5 - 5.2 mmol/L   Chloride 99 96 - 106 mmol/L   CO2 25 20 - 29 mmol/L   Calcium 10.1 8.6 - 10.2 mg/dL  Magnesium  Result Value Ref Range   Magnesium 1.8 1.6 - 2.3 mg/dL      Assessment & Plan:   Problem List Items Addressed This Visit      Cardiovascular and Mediastinum   Hypertensive heart disease    Chronic, ongoing.  BP slightly above goal.  Continue current medication regimen and collaboration with cardiology.  Will recheck BMP today.  Consider trial of low dose ARB in upcoming months and CCM referral.      Relevant Orders   Basic Metabolic Panel (BMET)     Endocrine   Type 2 diabetes mellitus with hyperglycemia, without long-term current use of insulin (HCC) - Primary    Chronic, ongoing.  A1C continues downward trend at 7.0% today. Continue current medication regimen.  Return in 3 months for f/u and foot exam.      Relevant Orders   Bayer DCA Hb A1c Waived   Hyperlipidemia associated with type 2 diabetes mellitus (HCC)    Chronic, ongoing.  Continue current medication regimen.  Lipid panel today and adjust dose as needed.      Relevant Orders   Lipid Panel w/o Chol/HDL Ratio      I discussed the assessment and treatment plan with the patient. The patient was provided an  opportunity to ask questions and all were answered. The patient agreed with the plan and demonstrated an understanding of the instructions.   The patient was advised to call back or seek an in-person evaluation if the symptoms worsen or if the condition fails to improve as anticipated.   I provided 15 minutes of time during this encounter.  Follow up plan: Return in about 3 months (around 08/12/2019) for T2DM, HTN/HLD, smoking.

## 2019-05-12 NOTE — Assessment & Plan Note (Signed)
Chronic, ongoing.  BP slightly above goal.  Continue current medication regimen and collaboration with cardiology.  Will recheck BMP today.  Consider trial of low dose ARB in upcoming months and CCM referral.

## 2019-05-13 ENCOUNTER — Other Ambulatory Visit: Payer: Self-pay | Admitting: Nurse Practitioner

## 2019-05-13 DIAGNOSIS — E870 Hyperosmolality and hypernatremia: Secondary | ICD-10-CM

## 2019-05-13 LAB — BASIC METABOLIC PANEL
BUN/Creatinine Ratio: 19 (ref 10–24)
BUN: 19 mg/dL (ref 8–27)
CO2: 25 mmol/L (ref 20–29)
Calcium: 9.7 mg/dL (ref 8.6–10.2)
Chloride: 106 mmol/L (ref 96–106)
Creatinine, Ser: 1.02 mg/dL (ref 0.76–1.27)
GFR calc Af Amer: 88 mL/min/{1.73_m2} (ref >59)
GFR calc non Af Amer: 76 mL/min/{1.73_m2} (ref >59)
Glucose: 87 mg/dL (ref 65–99)
Potassium: 3.9 mmol/L (ref 3.5–5.2)
Sodium: 149 mmol/L — ABNORMAL HIGH (ref 134–144)

## 2019-05-13 LAB — LIPID PANEL W/O CHOL/HDL RATIO
Cholesterol, Total: 117 mg/dL (ref 100–199)
HDL: 31 mg/dL — ABNORMAL LOW (ref >39)
LDL Calculated: 56 mg/dL (ref 0–99)
Triglycerides: 152 mg/dL — ABNORMAL HIGH (ref 0–149)
VLDL Cholesterol Cal: 30 mg/dL (ref 5–40)

## 2019-05-13 NOTE — Progress Notes (Signed)
Sodium recheck

## 2019-05-19 ENCOUNTER — Other Ambulatory Visit: Payer: Medicare Other

## 2019-05-19 ENCOUNTER — Other Ambulatory Visit: Payer: Self-pay

## 2019-05-19 DIAGNOSIS — E870 Hyperosmolality and hypernatremia: Secondary | ICD-10-CM | POA: Diagnosis not present

## 2019-05-20 LAB — SODIUM: Sodium: 142 mmol/L (ref 134–144)

## 2019-05-21 ENCOUNTER — Telehealth: Payer: Self-pay | Admitting: Nurse Practitioner

## 2019-05-21 NOTE — Telephone Encounter (Signed)
Pt called in to change the time of appt. He mentioned that he needs a refill of his sildenafil. Please advise.

## 2019-06-16 ENCOUNTER — Telehealth: Payer: Self-pay | Admitting: *Deleted

## 2019-06-16 DIAGNOSIS — Z122 Encounter for screening for malignant neoplasm of respiratory organs: Secondary | ICD-10-CM

## 2019-06-16 DIAGNOSIS — Z87891 Personal history of nicotine dependence: Secondary | ICD-10-CM

## 2019-06-16 NOTE — Telephone Encounter (Signed)
Patient has been notified that annual lung cancer screening low dose CT scan is due currently or will be in near future. Confirmed that patient is within the age range of 55-77, and asymptomatic, (no signs or symptoms of lung cancer). Patient denies illness that would prevent curative treatment for lung cancer if found. Verified smoking history, (former, quit 06/2017, 41 pack year). The shared decision making visit was done 05/04/16. Patient is agreeable for CT scan being scheduled.

## 2019-06-22 ENCOUNTER — Other Ambulatory Visit: Payer: Self-pay | Admitting: Internal Medicine

## 2019-06-22 ENCOUNTER — Other Ambulatory Visit: Payer: Self-pay | Admitting: Nurse Practitioner

## 2019-06-23 ENCOUNTER — Ambulatory Visit
Admission: RE | Admit: 2019-06-23 | Discharge: 2019-06-23 | Disposition: A | Discharge status: Home / Self Care | Payer: Medicare Other | Source: Ambulatory Visit | Attending: Oncology | Admitting: Oncology

## 2019-06-23 ENCOUNTER — Other Ambulatory Visit: Payer: Self-pay

## 2019-06-23 DIAGNOSIS — Z87891 Personal history of nicotine dependence: Secondary | ICD-10-CM | POA: Insufficient documentation

## 2019-06-23 DIAGNOSIS — Z122 Encounter for screening for malignant neoplasm of respiratory organs: Secondary | ICD-10-CM | POA: Insufficient documentation

## 2019-06-24 ENCOUNTER — Encounter: Payer: Self-pay | Admitting: *Deleted

## 2019-07-01 ENCOUNTER — Other Ambulatory Visit: Payer: Self-pay | Admitting: Nurse Practitioner

## 2019-08-13 ENCOUNTER — Encounter: Payer: Self-pay | Admitting: Nurse Practitioner

## 2019-08-13 DIAGNOSIS — I700 Atherosclerosis of aorta: Secondary | ICD-10-CM | POA: Insufficient documentation

## 2019-08-19 ENCOUNTER — Ambulatory Visit: Payer: Self-pay | Admitting: Pharmacist

## 2019-08-19 ENCOUNTER — Encounter: Payer: Self-pay | Admitting: Nurse Practitioner

## 2019-08-19 ENCOUNTER — Ambulatory Visit (INDEPENDENT_AMBULATORY_CARE_PROVIDER_SITE_OTHER): Payer: Medicare Other | Admitting: Nurse Practitioner

## 2019-08-19 ENCOUNTER — Ambulatory Visit (INDEPENDENT_AMBULATORY_CARE_PROVIDER_SITE_OTHER): Payer: Medicare Other | Admitting: *Deleted

## 2019-08-19 ENCOUNTER — Ambulatory Visit: Payer: Medicare Other | Admitting: Nurse Practitioner

## 2019-08-19 ENCOUNTER — Other Ambulatory Visit: Payer: Self-pay

## 2019-08-19 VITALS — BP 132/86 | Temp 98.5°F | Ht 75.0 in | Wt 204.0 lb

## 2019-08-19 DIAGNOSIS — E785 Hyperlipidemia, unspecified: Secondary | ICD-10-CM | POA: Diagnosis not present

## 2019-08-19 DIAGNOSIS — E1165 Type 2 diabetes mellitus with hyperglycemia: Secondary | ICD-10-CM

## 2019-08-19 DIAGNOSIS — E1169 Type 2 diabetes mellitus with other specified complication: Secondary | ICD-10-CM

## 2019-08-19 DIAGNOSIS — I2510 Atherosclerotic heart disease of native coronary artery without angina pectoris: Secondary | ICD-10-CM

## 2019-08-19 DIAGNOSIS — I119 Hypertensive heart disease without heart failure: Secondary | ICD-10-CM

## 2019-08-19 LAB — BAYER DCA HB A1C WAIVED: HB A1C (BAYER DCA - WAIVED): 7 % — ABNORMAL HIGH (ref <7.0)

## 2019-08-19 LAB — MICROALBUMIN, URINE WAIVED
Creatinine, Urine Waived: 50 mg/dL (ref 10–300)
Microalb, Ur Waived: 10 mg/L (ref 0–19)
Microalb/Creat Ratio: <30 mg/g (ref <30)

## 2019-08-19 NOTE — Patient Instructions (Signed)
Visit Information  Goals Addressed            This Visit's Progress     Patient Stated   . "I want to control my diabetes" (pt-stated)       Current Barriers:  . 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 Current antihyperglycemic regimen: metformin 1000 mg BID . Current meal patterns: o Breakfast: egg sandwich; maybe piece of sausage o Mid morning snack: Chicken or steak biscuit  o Lunch: 1 banana, few potato chips; unsweet tea; diet soda o Supper: Cooks; sometimes vegetables; sometimes fried chicken; baked pork chops; unsweet tea or diet soda; water  o Dessert: small serving of cookie, cake most days . Current blood glucose readings: Checks after lunch daily or every other day; 99 -120s  . Cardiovascular risk reduction: o Current hypertensive regimen: amlldipine 5 mg daily, chlorthalidone 25 mg daily, furosemide 20 mg daily, metoprolol 12.5 mg BID o Current hyperlipidemia regimen: atorvastatin 20 mg daily; LDL at goal <70  Pharmacist Clinical Goal(s):  [name] Over the next 90 days, patient with work with PharmD and primary care provider to address optimized self-management of diabetes  Interventions: . Comprehensive medication review performed, medication list updated in electronic medical record . Educated on definition of A1c, goal fasting and post prandial sugars.  . Encouraged patient to check occasional post-supper blood sugars to evaluate carbohydrate content of supper meal. Patient will do this . Discussed carb "trade offs" (eg, if eating pinto beans, eat less cornbread; if wants a serving of cobbler, skip the cornbread).   Patient Self Care Activities:  . Patient will check blood glucose daily , document, and provide at future appointments . Patient will take medications as prescribed . Patient will contact provider with any episodes of hypoglycemia . Patient will  report any questions or concerns to provider   Initial goal documentation        The patient verbalized understanding of instructions provided today and declined a print copy of patient instruction materials.   Plan: - CCM team will outreach patient in the next 4-5 weeks for continued medication management support  Catie Darnelle Maffucci, PharmD Clinical Pharmacist [place] [phone]

## 2019-08-19 NOTE — Assessment & Plan Note (Signed)
Chronic, ongoing.  Continue current medication regimen and adjust as needed.  Lipid panel next visit.    

## 2019-08-19 NOTE — Progress Notes (Signed)
BP 132/86   Temp 98.5 F (36.9 C) (Oral)   Ht 6\' 3"  (1.905 m)   Wt 204 lb (92.5 kg)   SpO2 97%   BMI 25.50 kg/m    Subjective:    Patient ID: Brendan [name], male    DOB: [birth year], 68 y.o.   MRN: [id number]  HPI: Brendan [name] is a 68 y.o. male  Chief Complaint  Patient presents with  . Diabetes  . Hypertension  . Hyperlipidemia   DIABETES Last A1C 7.0% and today 7.0%.  Continues on Metformin 1000 MG BID.   Hypoglycemic episodes:no Polydipsia/polyuria: no Visual disturbance: no Chest pain: no Paresthesias: no Glucose Monitoring: yes  Accucheck frequency: Daily  Fasting glucose: 111 this morning, 97 to 125  Post prandial: 110-125  Evening:  Before meals: Taking Insulin?: no  Long acting insulin:  Short acting insulin: Blood Pressure Monitoring: not checking Retinal Examination: Not up to Date Foot Exam: Up to Date Pneumovax: Up to Date Influenza: Up to Date Aspirin: yes   HYPERTENSION / HYPERLIPIDEMIA Continues on Metoprolol, Lasix, Hygroton, Amlodipine, and Lipitor. Followed by cardiology and last seen 03/04/2019 by NP Sharolyn Douglas.  Reviewed note, they added Potassium Chloride daily due to borderline low K+ and double diuretic therapy. Allergic to Lisinopril.  Satisfied with current treatment? yes Duration of hypertension: chronic BP monitoring frequency: not checking BP range:  BP medication side effects: no Duration of hyperlipidemia: chronic Cholesterol medication side effects: no Cholesterol supplements: none Medication compliance: good compliance Aspirin: yes Recent stressors: no Recurrent headaches: no Visual changes: no Palpitations: no Dyspnea: no Chest pain: no Lower extremity edema: no Dizzy/lightheaded: no  Relevant past medical, surgical, family and social history reviewed and updated as indicated. Interim medical history since our last visit reviewed. Allergies and medications reviewed and updated.  Review of Systems  Constitutional: Negative  for activity change, diaphoresis, fatigue and fever.  Respiratory: Negative for cough, chest tightness, shortness of breath and wheezing.   Cardiovascular: Negative for chest pain, palpitations and leg swelling.  Gastrointestinal: Negative for abdominal distention, abdominal pain, constipation, diarrhea, nausea and vomiting.  Endocrine: Negative for cold intolerance, heat intolerance, polydipsia, polyphagia and polyuria.  Neurological: Negative for dizziness, syncope, weakness, light-headedness, numbness and headaches.  Psychiatric/Behavioral: Negative.     Per HPI unless specifically indicated above     Objective:    BP 132/86   Temp 98.5 F (36.9 C) (Oral)   Ht 6\' 3"  (1.905 m)   Wt 204 lb (92.5 kg)   SpO2 97%   BMI 25.50 kg/m   Wt Readings from Last 3 Encounters:  08/19/19 204 lb (92.5 kg)  06/23/19 208 lb (94.3 kg)  05/12/19 206 lb (93.4 kg)    Physical Exam Vitals signs and nursing note reviewed.  Constitutional:      General: He is awake. He is not in acute distress.    Appearance: He is well-developed. He is not ill-appearing.  HENT:     Head: Normocephalic and atraumatic.     Right Ear: Hearing normal. No drainage.     Left Ear: Hearing normal. No drainage.  Eyes:     General: Lids are normal.        Right eye: No discharge.        Left eye: No discharge.     Conjunctiva/sclera: Conjunctivae normal.     Pupils: Pupils are equal, round, and reactive to light.  Neck:     Musculoskeletal: Normal range of motion and neck supple.  Thyroid: No thyromegaly.     Vascular: No carotid bruit.  Cardiovascular:     Rate and Rhythm: Normal rate and regular rhythm.     Heart sounds: Normal heart sounds, S1 normal and S2 normal. No murmur. No gallop.   Pulmonary:     Effort: Pulmonary effort is normal. No accessory muscle usage or respiratory distress.     Breath sounds: Normal breath sounds.  Abdominal:     General: Bowel sounds are normal.     Palpations: Abdomen is  soft.  Musculoskeletal: Normal range of motion.     Right lower leg: No edema.     Left lower leg: No edema.  Skin:    General: Skin is warm and dry.  Neurological:     Mental Status: He is alert and oriented to person, place, and time.  Psychiatric:        Attention and Perception: Attention normal.        Mood and Affect: Mood normal.        Speech: Speech normal.        Behavior: Behavior normal. Behavior is cooperative.        Thought Content: Thought content normal.        Judgment: Judgment normal.     Results for orders placed or performed in visit on 05/19/19  Sodium  Result Value Ref Range   Sodium 142 134 - 144 mmol/L      Assessment & Plan:   Problem List Items Addressed This Visit      Cardiovascular and Mediastinum   Hypertensive heart disease    Chronic, ongoing with BP at goal today.  Continue current medication regimen and adjust as needed.  Continue collaboration with cardiology.  CMP next visit.          Endocrine   Type 2 diabetes mellitus with hyperglycemia, without long-term current use of insulin (HCC) - Primary    Chronic, ongoing.  A1C continues at 7.0% today. Continue current medication regimen.  Return in 3 months for follow-up.  CCM referral placed.      Relevant Orders   Bayer DCA Hb A1c Waived   Microalbumin, Urine Waived   Ambulatory referral to Chronic Care Management Services   Hyperlipidemia associated with type 2 diabetes mellitus (HCC)    Chronic, ongoing.  Continue current medication regimen and adjust as needed.  Lipid panel next visit.            Follow up plan: Return in about 3 months (around 11/19/2019) for T2DM, HTN/HLD, COPD.

## 2019-08-19 NOTE — Chronic Care Management (AMB) (Signed)
Chronic Care Management   Note  08/19/2019 Name: Brendan [name] MRN: [id number] DOB: 04-Oct-1951   Subjective:  Brendan [name] is a 68 y.o. year old male who is a primary care patient of [name], [name], NP. The CCM team was consulted for assistance with chronic disease management and care coordination needs.    Met with patient face to face during primary care provider appointment today with RN CM.   [name] was given information about Chronic Care Management services today including:  1. CCM service includes personalized support from designated clinical staff supervised by his physician, including individualized plan of care and coordination with other care providers 2. 24/7 contact phone numbers for assistance for urgent and routine care needs. 3. Service will only be billed when office clinical staff spend 20 minutes or more in a month to coordinate care. 4. Only one practitioner may furnish and bill the service in a calendar month. 5. The patient may stop CCM services at any time (effective at the end of the month) by phone call to the office staff. 6. The patient will be responsible for cost sharing (co-pay) of up to 20% of the service fee (after annual deductible is met).  Patient agreed to services and verbal consent obtained.   Review of patient status, including review of consultants reports, laboratory and other test data, was performed as part of comprehensive evaluation and provision of chronic care management services.   Objective:  Lab Results  Component Value Date   CREATININE 1.02 05/12/2019   CREATININE 1.21 03/11/2019   CREATININE 1.22 02/10/2019    Lab Results  Component Value Date   HGBA1C 7.0 (H) 05/12/2019       Component Value Date/Time   CHOL 117 05/12/2019 1326   CHOL 111 02/29/2016 1558   TRIG 152 (H) 05/12/2019 1326   TRIG 73 02/29/2016 1558   HDL 31 (L) 05/12/2019 1326   CHOLHDL 3.5 08/21/2018 1453   VLDL 15 02/29/2016 1558   LDLCALC 56  05/12/2019 1326    Clinical ASCVD: Yes - s/p CABG  BP Readings from Last 3 Encounters:  08/19/19 132/86  05/12/19 (!) 144/84  03/04/19 120/70    Allergies  Allergen Reactions  . Lisinopril Other (See Comments)    Fatigue and ED  . No Known Allergies     Medications Reviewed Today    Reviewed by [name], NP (Nurse Practitioner) on 08/19/19 at [place] List Status: <None>  Medication Order Taking? Sig Documenting Provider Last Dose Status Informant  acetaminophen (TYLENOL) 500 MG tablet [id number] Yes Take 1 tablet (500 mg total) by mouth every 6 (six) hours as needed for mild pain or headache. [name], PA-C Taking Active   albuterol (PROVENTIL HFA;VENTOLIN HFA) 108 (90 Base) MCG/ACT inhaler [id number] No Inhale 2 puffs into the lungs every 6 (six) hours as needed for wheezing or shortness of breath.  Patient not taking: Reported on 08/19/2019   [name], NP Not Taking Active Self  amLODipine (NORVASC) 5 MG tablet [id number] Yes Take 1 tablet (5 mg total) by mouth daily. [name], NP Taking Active   aspirin EC 81 MG tablet [id number] Yes Take 1 tablet (81 mg total) by mouth daily. End, [name], MD Taking Active   atorvastatin (LIPITOR) 20 MG tablet [id number] Yes Take 1 tablet (20 mg total) by mouth daily. [name] T, NP Taking Active   azelastine (ASTELIN) 0.1 % nasal spray [id number] Yes Place 2 sprays into both nostrils at  bedtime.  [provider] Taking Active Self  chlorthalidone (HYGROTON) 25 MG tablet [id number] Yes Take 1 tablet (25 mg total) by mouth daily. [name] T, NP Taking Active   furosemide (LASIX) 20 MG tablet [id number] Yes Take 1 tablet (20 mg total) by mouth daily. [name], NP Taking Active   loratadine (CLARITIN) 10 MG tablet [id number] Yes Take 1 tablet (10 mg total) by mouth daily. [name] T, NP Taking Active   metFORMIN (GLUCOPHAGE) 1000 MG tablet [id number] Yes Take 1 tablet (1,000  mg total) by mouth 2 (two) times daily with a meal. [name], [name], NP Taking Active   metoprolol tartrate (LOPRESSOR) 25 MG tablet [id number] Yes Take 0.5 tablets (12.5 mg total) by mouth 2 (two) times daily. [name] T, NP Taking Active   montelukast (SINGULAIR) 10 MG tablet [id number] Yes Take 1 tablet (10 mg total) by mouth at bedtime. [name] T, NP Taking Active   Potassium Chloride ER 20 MEQ TBCR [id number] Yes Take 2 tablets (40 meq) by mouth once daily [name], NP Taking Active   sildenafil (REVATIO) 20 MG tablet [id number] Yes TAKE 1 TO 5 TABLETS AS NEEDED. [name] T, NP Taking Active   tamsulosin (FLOMAX) 0.4 MG CAPS capsule [id number] Yes Take 1 capsule (0.4 mg total) by mouth daily. [name], NP Taking Active            Assessment:   Goals Addressed            This Visit's Progress     Patient Stated   . "I want to control my diabetes" (pt-stated)       Current Barriers:  . 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 Current antihyperglycemic regimen: metformin 1000 mg BID . Current meal patterns: o Breakfast: egg sandwich; maybe piece of sausage o Mid morning snack: Chicken or steak biscuit  o Lunch: 1 banana, few potato chips; unsweet tea; diet soda o Supper: Cooks; sometimes vegetables; sometimes fried chicken; baked pork chops; unsweet tea or diet soda; water  o Dessert: small serving of cookie, cake most days . Current blood glucose readings: Checks after lunch daily or every other day; 99 -120s  . Cardiovascular risk reduction: o Current hypertensive regimen: amlldipine 5 mg daily, chlorthalidone 25 mg daily, furosemide 20 mg daily, metoprolol 12.5 mg BID o Current hyperlipidemia regimen: atorvastatin 20 mg daily; LDL at goal <70  Pharmacist Clinical Goal(s):  [name] Over the next 90 days, patient with work  with PharmD and primary care provider to address optimized self-management of diabetes  Interventions: . Comprehensive medication review performed, medication list updated in electronic medical record . Educated on definition of A1c, goal fasting and post prandial sugars.  . Encouraged patient to check occasional post-supper blood sugars to evaluate carbohydrate content of supper meal. Patient will do this . Discussed carb "trade offs" (eg, if eating pinto beans, eat less cornbread; if wants a serving of cobbler, skip the cornbread).   Patient Self Care Activities:  . Patient will check blood glucose daily , document, and provide at future appointments . Patient will take medications as prescribed . Patient will contact provider with any episodes of hypoglycemia . Patient will report any questions or concerns to provider   Initial goal documentation        Plan: -  CCM team will outreach patient in the next 4-5 weeks for continued medication management support  Catie Darnelle Maffucci, PharmD Clinical Pharmacist [place] [phone]

## 2019-08-19 NOTE — Assessment & Plan Note (Signed)
Chronic, ongoing.  A1C continues at 7.0% today. Continue current medication regimen.  Return in 3 months for follow-up.  CCM referral placed.

## 2019-08-19 NOTE — Patient Instructions (Signed)
Carbohydrate Counting for Diabetes Mellitus, Adult  Carbohydrate counting is a method of keeping track of how many carbohydrates you eat. Eating carbohydrates naturally increases the amount of sugar (glucose) in the blood. Counting how many carbohydrates you eat helps keep your blood glucose within normal limits, which helps you manage your diabetes (diabetes mellitus). It is important to know how many carbohydrates you can safely have in each meal. This is different for every person. A diet and nutrition specialist (registered dietitian) can help you make a meal plan and calculate how many carbohydrates you should have at each meal and snack. Carbohydrates are found in the following foods:  Grains, such as breads and cereals.  Dried beans and soy products.  Starchy vegetables, such as potatoes, peas, and corn.  Fruit and fruit juices.  Milk and yogurt.  Sweets and snack foods, such as cake, cookies, candy, chips, and soft drinks. How do I count carbohydrates? There are two ways to count carbohydrates in food. You can use either of the methods or a combination of both. Reading "Nutrition Facts" on packaged food The "Nutrition Facts" list is included on the labels of almost all packaged foods and beverages in the [place] It includes:  The serving size.  Information about nutrients in each serving, including the grams (g) of carbohydrate per serving. To use the "Nutrition Facts":  Decide how many servings you will have.  Multiply the number of servings by the number of carbohydrates per serving.  The resulting number is the total amount of carbohydrates that you will be having. Learning standard serving sizes of other foods When you eat carbohydrate foods that are not packaged or do not include "Nutrition Facts" on the label, you need to measure the servings in order to count the amount of carbohydrates:  Measure the foods that you will eat with a food scale or measuring cup, if needed.   Decide how many standard-size servings you will eat.  Multiply the number of servings by 15. Most carbohydrate-rich foods have about 15 g of carbohydrates per serving. ? For example, if you eat 8 oz (170 g) of strawberries, you will have eaten 2 servings and 30 g of carbohydrates (2 servings x 15 g = 30 g).  For foods that have more than one food mixed, such as soups and casseroles, you must count the carbohydrates in each food that is included. The following list contains standard serving sizes of common carbohydrate-rich foods. Each of these servings has about 15 g of carbohydrates:   hamburger bun or  English muffin.   oz (15 mL) syrup.   oz (14 g) jelly.  1 slice of bread.  1 six-inch tortilla.  3 oz (85 g) cooked rice or pasta.  4 oz (113 g) cooked dried beans.  4 oz (113 g) starchy vegetable, such as peas, corn, or potatoes.  4 oz (113 g) hot cereal.  4 oz (113 g) mashed potatoes or  of a large baked potato.  4 oz (113 g) canned or frozen fruit.  4 oz (120 mL) fruit juice.  4-6 crackers.  6 chicken nuggets.  6 oz (170 g) unsweetened dry cereal.  6 oz (170 g) plain fat-free yogurt or yogurt sweetened with artificial sweeteners.  8 oz (240 mL) milk.  8 oz (170 g) fresh fruit or one small piece of fruit.  24 oz (680 g) popped popcorn. Example of carbohydrate counting Sample meal  3 oz (85 g) chicken breast.  6 oz (170 g)   brown rice.  4 oz (113 g) corn.  8 oz (240 mL) milk.  8 oz (170 g) strawberries with sugar-free whipped topping. Carbohydrate calculation 1. Identify the foods that contain carbohydrates: ? Rice. ? Corn. ? Milk. ? Strawberries. 2. Calculate how many servings you have of each food: ? 2 servings rice. ? 1 serving corn. ? 1 serving milk. ? 1 serving strawberries. 3. Multiply each number of servings by 15 g: ? 2 servings rice x 15 g = 30 g. ? 1 serving corn x 15 g = 15 g. ? 1 serving milk x 15 g = 15 g. ? 1 serving  strawberries x 15 g = 15 g. 4. Add together all of the amounts to find the total grams of carbohydrates eaten: ? 30 g + 15 g + 15 g + 15 g = 75 g of carbohydrates total. Summary  Carbohydrate counting is a method of keeping track of how many carbohydrates you eat.  Eating carbohydrates naturally increases the amount of sugar (glucose) in the blood.  Counting how many carbohydrates you eat helps keep your blood glucose within normal limits, which helps you manage your diabetes.  A diet and nutrition specialist (registered dietitian) can help you make a meal plan and calculate how many carbohydrates you should have at each meal and snack. This information is not intended to replace advice given to you by your health care provider. Make sure you discuss any questions you have with your health care provider. Document Released: 12/17/2005 Document Revised: 07/11/2017 Document Reviewed: 05/30/2016 Elsevier Patient Education  2020 Elsevier Inc.  

## 2019-08-19 NOTE — Assessment & Plan Note (Signed)
Chronic, ongoing with BP at goal today.  Continue current medication regimen and adjust as needed.  Continue collaboration with cardiology.  CMP next visit.

## 2019-08-20 ENCOUNTER — Encounter: Payer: Self-pay | Admitting: Family Medicine

## 2019-08-20 ENCOUNTER — Ambulatory Visit (INDEPENDENT_AMBULATORY_CARE_PROVIDER_SITE_OTHER): Payer: Medicare Other | Admitting: Family Medicine

## 2019-08-20 DIAGNOSIS — I2510 Atherosclerotic heart disease of native coronary artery without angina pectoris: Secondary | ICD-10-CM | POA: Diagnosis not present

## 2019-08-20 DIAGNOSIS — E785 Hyperlipidemia, unspecified: Secondary | ICD-10-CM

## 2019-08-20 DIAGNOSIS — E1169 Type 2 diabetes mellitus with other specified complication: Secondary | ICD-10-CM | POA: Diagnosis not present

## 2019-08-20 NOTE — Progress Notes (Signed)
   There were no vitals taken for this visit.   Subjective:    Patient ID: Brendan [name], male    DOB: 19-Dec-1951, 68 y.o.   MRN: [id number]  HPI: Brendan [name] is a 68 y.o. male  Wants diabetic shoes Reviewed medical history patient with no chronic foot problems no edema no calluses no amputations might have some mild diabetic neuropathy but not enough to mention yesterday at physical.  Does have problems getting shoes to fit and does have diabetes.  Relevant past medical, surgical, family and social history reviewed and updated as indicated. Interim medical history since our last visit reviewed. Allergies and medications reviewed and updated.  Review of Systems  Per HPI unless specifically indicated above     Objective:    There were no vitals taken for this visit.  Wt Readings from Last 3 Encounters:  08/19/19 204 lb (92.5 kg)  06/23/19 208 lb (94.3 kg)  05/12/19 206 lb (93.4 kg)    Physical Exam  Results for orders placed or performed in visit on 08/19/19  Bayer DCA Hb A1c Waived  Result Value Ref Range   HB A1C (BAYER DCA - WAIVED) 7.0 (H) <7.0 %  Microalbumin, Urine Waived  Result Value Ref Range   Microalb, Ur Waived 10 0 - 19 mg/L   Creatinine, Urine Waived 50 10 - 300 mg/dL   Microalb/Creat Ratio <30 <30 mg/g      Assessment & Plan:   Problem List Items Addressed This Visit      Endocrine   Hyperlipidemia associated with type 2 diabetes mellitus ([place])    Discussed diabetes and diabetic shoes patient currently does not qualify for diabetic shoes discussed going to a sporting[employer] good store to get shoes.         Telemedicine using audio/video telecommunications for a synchronous communication visit. Today's visit due to COVID-19 isolation precautions I connected with and verified that I am speaking with the correct person using two identifiers.   I discussed the limitations, risks, security and privacy concerns of performing an evaluation and management service  by telecommunication and the availability of in person appointments. I also discussed with the patient that there may be a patient responsible charge related to this service. The patient expressed understanding and agreed to proceed. The patient's location is home. I am at home.   I discussed the assessment and treatment plan with the patient. The patient was provided an opportunity to ask questions and all were answered. The patient agreed with the plan and demonstrated an understanding of the instructions.   The patient was advised to call back or seek an in-person evaluation if the symptoms worsen or if the condition fails to improve as anticipated.   I provided 21+ minutes of time during this encounter.  Follow up plan: Return for As scheduled.

## 2019-08-20 NOTE — Assessment & Plan Note (Signed)
Discussed diabetes and diabetic shoes patient currently does not qualify for diabetic shoes discussed going to a [employer] good store to get shoes.

## 2019-08-23 NOTE — Chronic Care Management (AMB) (Signed)
Chronic Care Management   Follow Up Note   08/23/2019 Name: Brendan [name] MRN: [id number] DOB: [birth year]  Referred by: [name], NP Reason for referral : No chief complaint on file.   Brendan [name] is a 68 y.o. year old male who is a primary care patient of [name], Brendan Faster, NP. The CCM team was consulted for assistance with chronic disease management and care coordination needs.    Review of patient status, including review of consultants reports, relevant laboratory and other test results, and collaboration with appropriate care team members and the patient's provider was performed as part of comprehensive patient evaluation and provision of chronic care management services.    SDOH (Social Determinants of Health) screening performed today: None. See Care Plan for related entries.   Patient reports burning/numbness sensation to bilateral feet. Has been seen by podiatry who recommended diabetic shoes according to patient. Patient reports checking his feet daily.   Outpatient Encounter Medications as of 08/19/2019  Medication Sig  . acetaminophen (TYLENOL) 500 MG tablet Take 1 tablet (500 mg total) by mouth every 6 (six) hours as needed for mild pain or headache.  . albuterol (PROVENTIL HFA;VENTOLIN HFA) 108 (90 Base) MCG/ACT inhaler Inhale 2 puffs into the lungs every 6 (six) hours as needed for wheezing or shortness of breath. (Patient not taking: Reported on 08/19/2019)  . amLODipine (NORVASC) 5 MG tablet Take 1 tablet (5 mg total) by mouth daily.  [name] aspirin EC 81 MG tablet Take 1 tablet (81 mg total) by mouth daily.  [name] atorvastatin (LIPITOR) 20 MG tablet Take 1 tablet (20 mg total) by mouth daily.  [name] azelastine (ASTELIN) 0.1 % nasal spray Place 2 sprays into both nostrils at bedtime.   . chlorthalidone (HYGROTON) 25 MG tablet Take 1 tablet (25 mg total) by mouth daily.  . furosemide (LASIX) 20 MG tablet Take 1 tablet (20 mg total) by mouth daily.  [name] loratadine (CLARITIN) 10 MG  tablet Take 1 tablet (10 mg total) by mouth daily.  . metFORMIN (GLUCOPHAGE) 1000 MG tablet Take 1 tablet (1,000 mg total) by mouth 2 (two) times daily with a meal.  . metoprolol tartrate (LOPRESSOR) 25 MG tablet Take 0.5 tablets (12.5 mg total) by mouth 2 (two) times daily.  . montelukast (SINGULAIR) 10 MG tablet Take 1 tablet (10 mg total) by mouth at bedtime.  . Potassium Chloride ER 20 MEQ TBCR Take 2 tablets (40 meq) by mouth once daily  . sildenafil (REVATIO) 20 MG tablet TAKE 1 TO 5 TABLETS AS NEEDED.  [name] tamsulosin (FLOMAX) 0.4 MG CAPS capsule Take 1 capsule (0.4 mg total) by mouth daily.   No facility-administered encounter medications on file as of 08/19/2019.      Goals Addressed            This Visit's Progress   . I would plike to get Diabetic shoes (pt-stated)       Current Barriers:  [name] Knowledge Deficits related to obtaining diabetic shoes  . Film/video editor.  . Chronic Disease Management support and education needs related to Diabetes   Nurse Case Manager Clinical Goal(s):  [name] Over the next 30 days, patient will work with [facility] and PCP to address needs related to obtaining Diabetic Shoes.  Interventions:  . Collaborated with [place]  regarding process of obtaining Diabetic Shoes. Rep stated patient needed to be seen by MD to complete the process.  . Discussed plans with patient for ongoing care management follow up and  provided patient with direct contact information for care management team . Reviewed scheduled/upcoming provider appointments including: 8/20 telehealth visit with Dr. Dossie Arbourrissman.    Patient Self Care Activities:  . Currently UNABLE TO independently obtain diabetic shoes  Initial goal documentation         The care management team will reach out to the patient again over the next 30 days.  The patient has been provided with contact information for the care management team and has been advised to call with any health related questions  or concerns.    Ma RingsJanci Zonnique Norkus RN, BSN Nurse Case Education officer, communityManager [facility]  [phone]) Business Mobile

## 2019-08-23 NOTE — Patient Instructions (Signed)
Thank you allowing the Chronic Care Management Team to be a part of your care! It was a pleasure speaking with you today!   CCM (Chronic Care Management) Team   Reynaldo Rossman RN, BSN Nurse Care Coordinator  [phone]  Catie [facility] PharmD  Clinical Pharmacist  [phone]  Eula Fried LCSW Clinical Social Worker [phone]  Goals Addressed            This Visit's Progress   . I would plike to get Diabetic shoes (pt-stated)       Current Barriers:  [name] Knowledge Deficits related to obtaining diabetic shoes  . Film/video editor.  . Chronic Disease Management support and education needs related to Diabetes   Nurse Case Manager Clinical Goal(s):  [name] Over the next 30 days, patient will work with [facility] and PCP to address needs related to obtaining Diabetic Shoes.  Interventions:  . Collaborated with [place]  regarding process of obtaining Diabetic Shoes. Rep stated patient needed to be seen by MD to complete the process.  . Discussed plans with patient for ongoing care management follow up and provided patient with direct contact information for care management team . Reviewed scheduled/upcoming provider appointments including: 8/20 telehealth visit with Dr. Jeananne Rama.    Patient Self Care Activities:  . Currently UNABLE TO independently obtain diabetic shoes  Initial goal documentation        The patient verbalized understanding of instructions provided today and declined a print copy of patient instruction materials.   The patient has been provided with contact information for the care management team and has been advised to call with any health related questions or concerns.

## 2019-09-01 ENCOUNTER — Telehealth: Payer: Self-pay

## 2019-09-09 ENCOUNTER — Telehealth: Payer: Self-pay

## 2019-09-16 ENCOUNTER — Other Ambulatory Visit: Payer: Self-pay

## 2019-09-16 MED ORDER — POTASSIUM CHLORIDE ER 20 MEQ PO TBCR: EXTENDED_RELEASE_TABLET | ORAL | 1 refills | Status: DC

## 2019-09-21 ENCOUNTER — Other Ambulatory Visit: Payer: Self-pay | Admitting: Nurse Practitioner

## 2019-10-14 ENCOUNTER — Telehealth: Payer: Self-pay

## 2019-10-14 ENCOUNTER — Ambulatory Visit: Payer: Self-pay | Admitting: Pharmacist

## 2019-10-14 ENCOUNTER — Ambulatory Visit: Payer: Self-pay | Admitting: *Deleted

## 2019-10-14 NOTE — Chronic Care Management (AMB) (Signed)
  Chronic Care Management   Note  10/14/2019 Name: Purl Claytor MRN: [id number] DOB: 05/24/1951  Bosco Paparella is a 68 y.o. year old male who is a primary care patient of [name], [name], NP. The CCM team was consulted for assistance with chronic disease management and care coordination needs.    Attempted to contact patient for medication management follow up. Left HIPAA compliant message for patient to return my call at his convenience.   Follow up plan: - If I do not hear back, will outreach again in the next 4-6 weeks  Catie Darnelle Maffucci, PharmD Clinical Pharmacist [place] [phone]

## 2019-10-14 NOTE — Chronic Care Management (AMB) (Signed)
  Chronic Care Management   Outreach Note  10/14/2019 Name: Brendan [name] MRN: [id number] DOB: 1951-01-28  Referred by: [name], NP Reason for referral : Chronic Care Management (Unsuccessful Outreach attempt x1 )   An unsuccessful telephone outreach was attempted today. The patient was referred to the case management team by for assistance with care management and care coordination.   Follow Up Plan: The care management team will reach out to the patient again over the next 30  days.   [name] Brendan Kindred RN, BSN Nurse Case Editor, [facility]  [phone]) Business Mobile

## 2019-11-11 ENCOUNTER — Ambulatory Visit: Payer: Medicare Other | Admitting: *Deleted

## 2019-11-11 ENCOUNTER — Ambulatory Visit: Payer: Medicare Other | Admitting: Pharmacist

## 2019-11-11 DIAGNOSIS — E1165 Type 2 diabetes mellitus with hyperglycemia: Secondary | ICD-10-CM

## 2019-11-11 DIAGNOSIS — I119 Hypertensive heart disease without heart failure: Secondary | ICD-10-CM

## 2019-11-11 NOTE — Patient Instructions (Signed)
Visit Information  Goals Addressed            This Visit's Progress     Patient Stated   . PharmD "I want to control my diabetes" (pt-stated)       Current Barriers:  . Diabetes: uncontrolled; most recent A1c 7.0% today  o Notes his shoulder/back have been "acting up" and causing pain recently; reports that being active seems to make it worse. Has been taking acetaminophen 1000 mg QAM, sometimes QPM as well. Has not tried ice or heat. o Shoulder - can raise to a certain spot, pain after that. 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 Current antihyperglycemic regimen: metformin 1000 mg BID . Current blood glucose readings:  o After work ~110-120s . Cardiovascular risk reduction: o Current hypertensive regimen: amlodipine 5 mg daily, chlorthalidone 25 mg daily, furosemide 20 mg daily, metoprolol tartrate 12.5 mg BID o Current hyperlipidemia regimen: atorvastatin 20 mg daily; LDL at goal <70  Pharmacist Clinical Goal(s):  [name] Over the next 90 days, patient with work with PharmD and primary care provider to address optimized self-management of diabetes  Interventions: . Comprehensive medication review performed, medication list updated in electronic medical record . Congratulated patient on continued control of BG . Recommended ice therapy, continued Bengay therapy, as well as continued acetaminophen. Recommended that if it worsens to contact clinic for a sooner appointment.   Patient Self Care Activities:  . Patient will check blood glucose daily , document, and provide at future appointments . Patient will take medications as prescribed . Patient will contact provider with any episodes of hypoglycemia . Patient will report any questions or concerns to provider   Please see past updates related to this goal by clicking on the "Past Updates" button in the selected goal         The patient verbalized understanding  of instructions provided today and declined a print copy of patient instruction materials.   Plan: - Will outreach patient for continued medication management in the next 6-8 weeks  Catie Darnelle Maffucci, PharmD Clinical Pharmacist [place] [phone]

## 2019-11-11 NOTE — Chronic Care Management (AMB) (Signed)
Chronic Care Management   Follow Up Note   11/11/2019 Name: Brendan [name] MRN: [id number] DOB: [birth year]  Referred by: Brendan Skiff, NP Reason for referral : Chronic Care Management (Medication Management)   Brendan [name] is a 68 y.o. year old male who is a primary care patient of [name], Dorie Rank, NP. The CCM team was consulted for assistance with chronic disease management and care coordination needs.    Contacted patient telephonically w/ Brendan Minor RN CM.   Review of patient status, including review of consultants reports, relevant laboratory and other test results, and collaboration with appropriate care team members and the patient's provider was performed as part of comprehensive patient evaluation and provision of chronic care management services.    SDOH (Social Determinants of Health) screening performed today: Physical Activity. See Care Plan for related entries.   Outpatient Encounter Medications as of 11/11/2019  Medication Sig Note  . acetaminophen (TYLENOL) 500 MG tablet Take 1 tablet (500 mg total) by mouth every 6 (six) hours as needed for mild pain or headache. 11/11/2019: Taking 1000 mg daily to twice daily   . amLODipine (NORVASC) 5 MG tablet Take 1 tablet (5 mg total) by mouth daily.   [name] aspirin EC 81 MG tablet Take 1 tablet (81 mg total) by mouth daily.   [name] atorvastatin (LIPITOR) 20 MG tablet Take 1 tablet (20 mg total) by mouth daily.   [name] azelastine (ASTELIN) 0.1 % nasal spray Place 2 sprays into both nostrils at bedtime.    . chlorthalidone (HYGROTON) 25 MG tablet Take 1 tablet (25 mg total) by mouth daily.   . furosemide (LASIX) 20 MG tablet Take 1 tablet (20 mg total) by mouth daily.   [name] loratadine (CLARITIN) 10 MG tablet Take 1 tablet (10 mg total) by mouth daily.   . metFORMIN (GLUCOPHAGE) 1000 MG tablet Take 1 tablet (1,000 mg total) by mouth 2 (two) times daily with a meal.   . metoprolol tartrate (LOPRESSOR) 25 MG tablet Take 0.5 tablets (12.5 mg  total) by mouth 2 (two) times daily.   . montelukast (SINGULAIR) 10 MG tablet Take 1 tablet (10 mg total) by mouth at bedtime.   . Potassium Chloride ER 20 MEQ TBCR Take 2 tablets (40 meq) by mouth once daily   . sildenafil (REVATIO) 20 MG tablet TAKE 1 TO 5 TABLETS AS NEEDED.   [name] tamsulosin (FLOMAX) 0.4 MG CAPS capsule Take 1 capsule (0.4 mg total) by mouth daily.   [name] albuterol (PROVENTIL HFA;VENTOLIN HFA) 108 (90 Base) MCG/ACT inhaler Inhale 2 puffs into the lungs every 6 (six) hours as needed for wheezing or shortness of breath. (Patient not taking: Reported on 08/19/2019)    No facility-administered encounter medications on file as of 11/11/2019.      Goals Addressed            This Visit's Progress     Patient Stated   . PharmD "I want to control my diabetes" (pt-stated)       Current Barriers:  . Diabetes: uncontrolled; most recent A1c 7.0% today  o Notes his shoulder/back have been "acting up" and causing pain recently; reports that being active seems to make it worse. Has been taking acetaminophen 1000 mg QAM, sometimes QPM as well. Has not tried ice or heat. o Shoulder - can raise to a certain spot, pain after that. 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 Current antihyperglycemic regimen: metformin 1000 mg BID . Current blood glucose readings:  o After work ~110-120s . Cardiovascular risk reduction: o Current hypertensive regimen: amlodipine 5 mg daily, chlorthalidone 25 mg daily, furosemide 20 mg daily, metoprolol tartrate 12.5 mg BID o Current hyperlipidemia regimen: atorvastatin 20 mg daily; LDL at goal <70  Pharmacist Clinical Goal(s):  [name] Over the next 90 days, patient with work with PharmD and primary care provider to address optimized self-management of diabetes  Interventions: . Comprehensive medication review performed, medication list updated in electronic medical record . Congratulated patient on  continued control of BG . Recommended ice therapy, continued Bengay therapy, as well as continued acetaminophen. Recommended that if it worsens to contact clinic for a sooner appointment.   Patient Self Care Activities:  . Patient will check blood glucose daily , document, and provide at future appointments . Patient will take medications as prescribed . Patient will contact provider with any episodes of hypoglycemia . Patient will report any questions or concerns to provider   Please see past updates related to this goal by clicking on the "Past Updates" button in the selected goal         Plan: - Will outreach patient for continued medication management in the next 6-8 weeks  Brendan [name], PharmD Clinical Pharmacist [place] [phone]

## 2019-11-11 NOTE — Chronic Care Management (AMB) (Signed)
Chronic Care Management   Follow Up Note   11/11/2019 Name: Brendan [name] MRN: [id number] DOB: 1951-11-08  Referred by: [name], NP Reason for referral : Chronic Care Management (DM HTN )   Brendan [name] is a 68 y.o. year old male who is a primary care patient of [name], [name], NP. The CCM team was consulted for assistance with chronic disease management and care coordination needs.    Review of patient status, including review of consultants reports, relevant laboratory and other test results, and collaboration with appropriate care team members and the patient's provider was performed as part of comprehensive patient evaluation and provision of chronic care management services.    SDOH (Social Determinants of Health) screening performed today: Financial Strain . See Care Plan for related entries.   Outpatient Encounter Medications as of 11/11/2019  Medication Sig Note  . acetaminophen (TYLENOL) 500 MG tablet Take 1 tablet (500 mg total) by mouth every 6 (six) hours as needed for mild pain or headache. 11/11/2019: Taking 1000 mg daily to twice daily   . albuterol (PROVENTIL HFA;VENTOLIN HFA) 108 (90 Base) MCG/ACT inhaler Inhale 2 puffs into the lungs every 6 (six) hours as needed for wheezing or shortness of breath. (Patient not taking: Reported on 08/19/2019)   . amLODipine (NORVASC) 5 MG tablet Take 1 tablet (5 mg total) by mouth daily.   [name] aspirin EC 81 MG tablet Take 1 tablet (81 mg total) by mouth daily.   [name] atorvastatin (LIPITOR) 20 MG tablet Take 1 tablet (20 mg total) by mouth daily.   [name] azelastine (ASTELIN) 0.1 % nasal spray Place 2 sprays into both nostrils at bedtime.    . chlorthalidone (HYGROTON) 25 MG tablet Take 1 tablet (25 mg total) by mouth daily.   . furosemide (LASIX) 20 MG tablet Take 1 tablet (20 mg total) by mouth daily.   [name] loratadine (CLARITIN) 10 MG tablet Take 1 tablet (10 mg total) by mouth daily.   . metFORMIN (GLUCOPHAGE) 1000 MG tablet Take 1  tablet (1,000 mg total) by mouth 2 (two) times daily with a meal.   . metoprolol tartrate (LOPRESSOR) 25 MG tablet Take 0.5 tablets (12.5 mg total) by mouth 2 (two) times daily.   . montelukast (SINGULAIR) 10 MG tablet Take 1 tablet (10 mg total) by mouth at bedtime.   . Potassium Chloride ER 20 MEQ TBCR Take 2 tablets (40 meq) by mouth once daily   . sildenafil (REVATIO) 20 MG tablet TAKE 1 TO 5 TABLETS AS NEEDED.   [name] tamsulosin (FLOMAX) 0.4 MG CAPS capsule Take 1 capsule (0.4 mg total) by mouth daily.    No facility-administered encounter medications on file as of 11/11/2019.      Goals Addressed            This Visit's Progress   . 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 Film/video editor.  . Chronic Disease Management support and education needs related to Diabetes   Nurse Case Manager Clinical Goal(s):  [name] Over the next 30 days, patient will work with [facility] and PCP to address needs related to obtaining Diabetic Shoes.  Interventions:  . Discussed plans with patient for ongoing care management follow up and provided patient with direct contact information for care management team . Reviewed scheduled/upcoming provider appointments including: 11/23 PCP visit . Discussed with patient he would still like  to  have diabetic shoes related to patient does a lot of walking with his job. He states he has a hard time getting shoes to fit and worries about getting blisters or calluses.  . Talked about discussing these concerns with provider at upcoming visit, and made him aware I would discuss this with provider as well.   Patient Self Care Activities:  . Currently UNABLE TO independently obtain diabetic shoes  Please see past updates related to this goal by clicking on the "Past Updates" button in the selected goal          The care management team will reach out to the patient again  over the next 60 days.  The patient has been provided with contact information for the care management team and has been advised to call with any health related questions or concerns.    Ma Rings Wilbert Hayashi RN, BSN Nurse Case Education officer, [facility]  [phone]) Business Mobile

## 2019-11-11 NOTE — Patient Instructions (Signed)
Thank you allowing the Chronic Care Management Team to be a part of your care! It was a pleasure speaking with you today!  CCM (Chronic Care Management) Team   Kemet Nijjar RN, BSN Nurse Care Coordinator  [phone]  [facility] PharmD  Clinical Pharmacist  [phone]  Eula Fried LCSW Clinical Social Worker [phone]  Goals Addressed            This Visit's Progress   . 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  Interventions:  . Discussed plans with patient for ongoing care management follow up and provided patient with direct contact information for care management team . Reviewed scheduled/upcoming provider appointments including: 11/23 PCP visit . Discussed with patient he would still like to  have diabetic shoes related to patient does a lot of walking with his job. He states he has a hard time getting shoes to fit and worries about getting blisters or calluses.  . Talked about discussing these concerns with provider at upcoming visit, and made him aware I would discuss this with provider as well.   Patient Self Care Activities:  . Currently UNABLE TO independently obtain diabetic shoes  Please see past updates related to this goal by clicking on the "Past Updates" button in the selected goal         The patient verbalized understanding of instructions provided today and declined a print copy of patient instruction materials.   The patient has been provided with contact information for the care management team and has been advised to call with any health related questions or concerns.

## 2019-11-16 ENCOUNTER — Other Ambulatory Visit: Payer: Self-pay | Admitting: Nurse Practitioner

## 2019-11-23 ENCOUNTER — Encounter: Payer: Self-pay | Admitting: Nurse Practitioner

## 2019-11-23 ENCOUNTER — Ambulatory Visit (INDEPENDENT_AMBULATORY_CARE_PROVIDER_SITE_OTHER): Payer: Medicare Other | Admitting: Nurse Practitioner

## 2019-11-23 ENCOUNTER — Other Ambulatory Visit: Payer: Self-pay

## 2019-11-23 VITALS — BP 132/78 | HR 63 | Temp 98.1°F

## 2019-11-23 DIAGNOSIS — M25511 Pain in right shoulder: Secondary | ICD-10-CM

## 2019-11-23 DIAGNOSIS — E1165 Type 2 diabetes mellitus with hyperglycemia: Secondary | ICD-10-CM | POA: Diagnosis not present

## 2019-11-23 DIAGNOSIS — G8929 Other chronic pain: Secondary | ICD-10-CM | POA: Insufficient documentation

## 2019-11-23 DIAGNOSIS — I2510 Atherosclerotic heart disease of native coronary artery without angina pectoris: Secondary | ICD-10-CM

## 2019-11-23 DIAGNOSIS — I119 Hypertensive heart disease without heart failure: Secondary | ICD-10-CM

## 2019-11-23 DIAGNOSIS — Z23 Encounter for immunization: Secondary | ICD-10-CM | POA: Diagnosis not present

## 2019-11-23 DIAGNOSIS — E785 Hyperlipidemia, unspecified: Secondary | ICD-10-CM | POA: Diagnosis not present

## 2019-11-23 DIAGNOSIS — E1169 Type 2 diabetes mellitus with other specified complication: Secondary | ICD-10-CM

## 2019-11-23 DIAGNOSIS — I700 Atherosclerosis of aorta: Secondary | ICD-10-CM | POA: Diagnosis not present

## 2019-11-23 DIAGNOSIS — J42 Unspecified chronic bronchitis: Secondary | ICD-10-CM

## 2019-11-23 LAB — BAYER DCA HB A1C WAIVED: HB A1C (BAYER DCA - WAIVED): 6.9 % (ref <7.0)

## 2019-11-23 MED ORDER — ATORVASTATIN CALCIUM 20 MG PO TABS: 20.0000 mg | ORAL_TABLET | Freq: Every day | ORAL | 3 refills | Status: DC

## 2019-11-23 MED ORDER — TAMSULOSIN HCL 0.4 MG PO CAPS: ORAL_CAPSULE | ORAL | 3 refills | Status: DC

## 2019-11-23 MED ORDER — MONTELUKAST SODIUM 10 MG PO TABS: 10.0000 mg | ORAL_TABLET | Freq: Every day | ORAL | 3 refills | Status: DC

## 2019-11-23 MED ORDER — METFORMIN HCL 1000 MG PO TABS: 1000.0000 mg | ORAL_TABLET | Freq: Two times a day (BID) | ORAL | 3 refills | Status: DC

## 2019-11-23 MED ORDER — METOPROLOL TARTRATE 25 MG PO TABS: 12.5000 mg | ORAL_TABLET | Freq: Two times a day (BID) | ORAL | 3 refills | Status: DC

## 2019-11-23 MED ORDER — AMLODIPINE BESYLATE 5 MG PO TABS: 5.0000 mg | ORAL_TABLET | Freq: Every day | ORAL | 3 refills | Status: DC

## 2019-11-23 MED ORDER — TIZANIDINE HCL 4 MG PO TABS: 4.0000 mg | ORAL_TABLET | Freq: Four times a day (QID) | ORAL | 0 refills | Status: DC | PRN

## 2019-11-23 NOTE — Patient Instructions (Addendum)
EUCERIN CREAM FOR LEGS and VOLTAREN GEL FOR SHOULDER  Carbohydrate Counting for Diabetes Mellitus, Adult  Carbohydrate counting is a method of keeping track of how many carbohydrates you eat. Eating carbohydrates naturally increases the amount of sugar (glucose) in the blood. Counting how many carbohydrates you eat helps keep your blood glucose within normal limits, which helps you manage your diabetes (diabetes mellitus). It is important to know how many carbohydrates you can safely have in each meal. This is different for every person. A diet and nutrition specialist (registered dietitian) can help you make a meal plan and calculate how many carbohydrates you should have at each meal and snack. Carbohydrates are found in the following foods:  Grains, such as breads and cereals.  Dried beans and soy products.  Starchy vegetables, such as potatoes, peas, and corn.  Fruit and fruit juices.  Milk and yogurt.  Sweets and snack foods, such as cake, cookies, candy, chips, and soft drinks. How do I count carbohydrates? There are two ways to count carbohydrates in food. You can use either of the methods or a combination of both. Reading "Nutrition Facts" on packaged food The "Nutrition Facts" list is included on the labels of almost all packaged foods and beverages in the [place] It includes:  The serving size.  Information about nutrients in each serving, including the grams (g) of carbohydrate per serving. To use the "Nutrition Facts":  Decide how many servings you will have.  Multiply the number of servings by the number of carbohydrates per serving.  The resulting number is the total amount of carbohydrates that you will be having. Learning standard serving sizes of other foods When you eat carbohydrate foods that are not packaged or do not include "Nutrition Facts" on the label, you need to measure the servings in order to count the amount of carbohydrates:  Measure the foods that you  will eat with a food scale or measuring cup, if needed.  Decide how many standard-size servings you will eat.  Multiply the number of servings by 15. Most carbohydrate-rich foods have about 15 g of carbohydrates per serving. ? For example, if you eat 8 oz (170 g) of strawberries, you will have eaten 2 servings and 30 g of carbohydrates (2 servings x 15 g = 30 g).  For foods that have more than one food mixed, such as soups and casseroles, you must count the carbohydrates in each food that is included. The following list contains standard serving sizes of common carbohydrate-rich foods. Each of these servings has about 15 g of carbohydrates:   hamburger bun or  English muffin.   oz (15 mL) syrup.   oz (14 g) jelly.  1 slice of bread.  1 six-inch tortilla.  3 oz (85 g) cooked rice or pasta.  4 oz (113 g) cooked dried beans.  4 oz (113 g) starchy vegetable, such as peas, corn, or potatoes.  4 oz (113 g) hot cereal.  4 oz (113 g) mashed potatoes or  of a large baked potato.  4 oz (113 g) canned or frozen fruit.  4 oz (120 mL) fruit juice.  4-6 crackers.  6 chicken nuggets.  6 oz (170 g) unsweetened dry cereal.  6 oz (170 g) plain fat-free yogurt or yogurt sweetened with artificial sweeteners.  8 oz (240 mL) milk.  8 oz (170 g) fresh fruit or one small piece of fruit.  24 oz (680 g) popped popcorn. Example of carbohydrate counting Sample meal  3  oz (85 g) chicken breast.  6 oz (170 g) brown rice.  4 oz (113 g) corn.  8 oz (240 mL) milk.  8 oz (170 g) strawberries with sugar-free whipped topping. Carbohydrate calculation 1. Identify the foods that contain carbohydrates: ? Rice. ? Corn. ? Milk. ? Strawberries. 2. Calculate how many servings you have of each food: ? 2 servings rice. ? 1 serving corn. ? 1 serving milk. ? 1 serving strawberries. 3. Multiply each number of servings by 15 g: ? 2 servings rice x 15 g = 30 g. ? 1 serving corn x 15 g = 15  g. ? 1 serving milk x 15 g = 15 g. ? 1 serving strawberries x 15 g = 15 g. 4. Add together all of the amounts to find the total grams of carbohydrates eaten: ? 30 g + 15 g + 15 g + 15 g = 75 g of carbohydrates total. Summary  Carbohydrate counting is a method of keeping track of how many carbohydrates you eat.  Eating carbohydrates naturally increases the amount of sugar (glucose) in the blood.  Counting how many carbohydrates you eat helps keep your blood glucose within normal limits, which helps you manage your diabetes.  A diet and nutrition specialist (registered dietitian) can help you make a meal plan and calculate how many carbohydrates you should have at each meal and snack. This information is not intended to replace advice given to you by your health care provider. Make sure you discuss any questions you have with your health care provider. Document Released: 12/17/2005 Document Revised: 07/11/2017 Document Reviewed: 05/30/2016 Elsevier Patient Education  2020 [employer] Influenza (Flu) Vaccine (Inactivated or Recombinant): What You Need to Know 1. Why get vaccinated? Influenza vaccine can prevent influenza (flu). Flu is a contagious disease that spreads around the [place] every year, usually between October and May. Anyone can get the flu, but it is more dangerous for some people. Infants and young children, people 68 years of age and older, pregnant women, and people with certain health conditions or a weakened immune system are at greatest risk of flu complications. Pneumonia, bronchitis, sinus infections and ear infections are examples of flu-related complications. If you have a medical condition, such as heart disease, cancer or diabetes, flu can make it worse. Flu can cause fever and chills, sore throat, muscle aches, fatigue, cough, headache, and runny or stuffy nose. Some people may have vomiting and diarrhea, though this is more common in children than adults. Each  year thousands of people in the [place] die from flu, and many more are hospitalized. Flu vaccine prevents millions of illnesses and flu-related visits to the doctor each year. 2. Influenza vaccine CDC recommends everyone 246 months of age and older get vaccinated every flu season. Children 6 months through 778 years of age may need 2 doses during a single flu season. Everyone else needs only 1 dose each flu season. It takes about 2 weeks for protection to develop after vaccination. There are many flu viruses, and they are always changing. Each year a new flu vaccine is made to protect against three or four viruses that are likely to cause disease in the upcoming flu season. Even when the vaccine doesn't exactly match these viruses, it may still provide some protection. Influenza vaccine does not cause flu. Influenza vaccine may be given at the same time as other vaccines. 3. Talk with your health care provider Tell your vaccine provider if the person getting  the vaccine:  Has had an allergic reaction after a previous dose of influenza vaccine, or has any severe, life-threatening allergies.  Has ever had Guillain-Barr Syndrome (also called GBS). In some cases, your health care provider may decide to postpone influenza vaccination to a future visit. People with minor illnesses, such as a cold, may be vaccinated. People who are moderately or severely ill should usually wait until they recover before getting influenza vaccine. Your health care provider can give you more information. 4. Risks of a vaccine reaction  Soreness, redness, and swelling where shot is given, fever, muscle aches, and headache can happen after influenza vaccine.  There may be a very small increased risk of Guillain-Barr Syndrome (GBS) after inactivated influenza vaccine (the flu shot). Young children who get the flu shot along with pneumococcal vaccine (PCV13), and/or DTaP vaccine at the same time might be slightly more  likely to have a seizure caused by fever. Tell your health care provider if a child who is getting flu vaccine has ever had a seizure. People sometimes faint after medical procedures, including vaccination. Tell your provider if you feel dizzy or have vision changes or ringing in the ears. As with any medicine, there is a very remote chance of a vaccine causing a severe allergic reaction, other serious injury, or death. 5. What if there is a serious problem? An allergic reaction could occur after the vaccinated person leaves the clinic. If you see signs of a severe allergic reaction (hives, swelling of the face and throat, difficulty breathing, a fast heartbeat, dizziness, or weakness), call 9-1-1 and get the person to the nearest hospital. For other signs that concern you, call your health care provider. Adverse reactions should be reported to the Vaccine Adverse Event Reporting System (VAERS). Your health care provider will usually file this report, or you can do it yourself. Visit the VAERS website at www.vaers.SamedayNews.es or call 403-533-9713.VAERS is only for reporting reactions, and VAERS staff do not give medical advice. 6. The National Vaccine Injury Compensation Program The [employer] Vaccine Injury Compensation Program (VICP) is a federal program that was created to compensate people who may have been injured by certain vaccines. Visit the VICP website at GoldCloset.com.ee or call (626) 795-9776 to learn about the program and about filing a claim. There is a time limit to file a claim for compensation. 7. How can I learn more?  Ask your healthcare provider.  Call your local or state health department.  Contact the Centers for Disease Control and Prevention (CDC): ? Call 4451503110 (1-800-CDC-INFO) or ? Visit CDC's https://gibson.com/ Vaccine Information Statement (Interim) Inactivated Influenza Vaccine (08/14/2018) This information is not intended to replace advice given to you  by your health care provider. Make sure you discuss any questions you have with your health care provider. Document Released: 10/11/2006 Document Revised: 04/07/2019 Document Reviewed: 08/18/2018 Elsevier Patient Education  2020 [employer].   Peripheral Vascular Disease  Peripheral vascular disease (PVD) is a disease of the blood vessels that are not part of your heart and brain. A simple term for PVD is poor circulation. In most cases, PVD narrows the blood vessels that carry blood from your heart to the rest of your body. This can reduce the supply of blood to your arms, legs, and internal organs, like your stomach or kidneys. However, PVD most often affects a person's lower legs and feet. Without treatment, PVD tends to get worse. PVD can also lead to acute ischemic limb. This is when an arm or  leg suddenly cannot get enough blood. This is a medical emergency. Follow these instructions at home: Lifestyle  Do not use any products that contain nicotine or tobacco, such as cigarettes and e-cigarettes. If you need help quitting, ask your doctor.  Lose weight if you are overweight. Or, stay at a healthy weight as told by your doctor.  Eat a diet that is low in fat and cholesterol. If you need help, ask your doctor.  Exercise regularly. Ask your doctor for activities that are right for you. General instructions  Take over-the-counter and prescription medicines only as told by your doctor.  Take good care of your feet: ? Wear comfortable shoes that fit well. ? Check your feet often for any cuts or sores.  Keep all follow-up visits as told by your doctor This is important. Contact a doctor if:  You have cramps in your legs when you walk.  You have leg pain when you are at rest.  You have coldness in a leg or foot.  Your skin changes.  You are unable to get or have an erection (erectile dysfunction).  You have cuts or sores on your feet that do not heal. Get help right away if:   Your arm or leg turns cold, numb, and blue.  Your arms or legs become red, warm, swollen, painful, or numb.  You have chest pain.  You have trouble breathing.  You suddenly have weakness in your face, arm, or leg.  You become very confused or you cannot speak.  You suddenly have a very bad headache.  You suddenly cannot see. Summary  Peripheral vascular disease (PVD) is a disease of the blood vessels.  A simple term for PVD is poor circulation. Without treatment, PVD tends to get worse.  Treatment may include exercise, low fat and low cholesterol diet, and quitting smoking. This information is not intended to replace advice given to you by your health care provider. Make sure you discuss any questions you have with your health care provider. Document Released: 03/13/2010 Document Revised: 11/29/2017 Document Reviewed: 01/24/2017 Elsevier Patient Education  2020 [employer].

## 2019-11-23 NOTE — Assessment & Plan Note (Signed)
Chronic, stable with minimal use of Albuterol.  Continue to monitor and plan for spirometry at upcoming visit.  Send refills of Albuterol as needed.

## 2019-11-23 NOTE — Assessment & Plan Note (Signed)
Chronic, ongoing with initial BP elevated but repeat BP at goal today.  Continue current medication regimen and adjust as needed.  Continue collaboration with cardiology.  Recommend he monitor BP at least a few times a week at home and document.  CMP this visit.

## 2019-11-23 NOTE — Progress Notes (Signed)
BP 132/78 (BP Location: Left Arm, Patient Position: Sitting)   Pulse 63   Temp 98.1 F (36.7 C) (Oral)   SpO2 96%    Subjective:    Patient ID: Brendan [name], male    DOB: 1951-12-05, 68 y.o.   MRN: [id number]  HPI: Brendan [name] is a 68 y.o. male  Chief Complaint  Patient presents with  . Diabetes  . Hyperlipidemia  . Hypertension   DIABETES Last A1C 7.0% in August 2020.  Continues on Metformin 1000 MG BID.   Hypoglycemic episodes:no Polydipsia/polyuria: no Visual disturbance: no Chest pain: no Paresthesias: no Glucose Monitoring: yes  Accucheck frequency: Daily  Fasting glucose: 99 this morning, 100-110  Post prandial: 150-160  Evening:  Before meals: Taking Insulin?: no  Long acting insulin:  Short acting insulin: Blood Pressure Monitoring: not checking Retinal Examination: Not up to Date Foot Exam: Up to Date Pneumovax: Up to Date Influenza: Up to Date Aspirin: yes   HYPERTENSION / HYPERLIPIDEMIA Continues on Metoprolol, Lasix, Hygroton, Amlodipine, and Lipitor. Followed by cardiology and last seen 03/04/2019 by NP Brion Aliment.  Reviewed note, they added Potassium Chloride daily due to borderline low K+ and doubled diuretic therapy. Allergic to Lisinopril. Aortic atherosclerosis noted on recent lung CT screening. Satisfied with current treatment? yes Duration of hypertension: chronic BP monitoring frequency: not checking BP range:  BP medication side effects: no Duration of hyperlipidemia: chronic Cholesterol medication side effects: no Cholesterol supplements: none Medication compliance: good compliance Aspirin: yes Recent stressors: no Recurrent headaches: no Visual changes: no Palpitations: no Dyspnea: no Chest pain: no Lower extremity edema: no Dizzy/lightheaded: no   COPD Has not smoked in 2 years since his bypass surgery.  Does not often use Albuterol. COPD status: stable Satisfied with current treatment?: yes Oxygen use: no Dyspnea frequency:   Cough frequency:  Rescue inhaler frequency:   Limitation of activity: no Productive cough:  Last Spirometry:  Pneumovax: Up to Date Influenza: Up to Date   RIGHT SHOULDER PAIN Reports occasional back spasms that started a couple months ago to left lower side (which he has had before), has now improved but this has led to right shoulder discomfort.  Right shoulder has been uncomfortable for a few weeks.  Is uncomfortable while laying down in bed, rolling over at times.  In upper deltoid area. Duration: weeks Mechanism of injury: lifting, messing with weed eater and doing yard work Location: Right, low back and upper back Onset: gradual Severity: 10/10 at worst when moves it Quality: sharp and aching Frequency: intermittent Radiation: radiation Aggravating factors: movement Alleviating factors: Tylenol, Aspercreme Status: fluctuating Treatments attempted: Tylenol, Aspercreme  Relief with NSAIDs?: No NSAIDs Taken Nighttime pain:  yes Paresthesias / decreased sensation:  no Bowel / bladder incontinence:  no Fevers:  no Dysuria / urinary frequency:  no  Relevant past medical, surgical, family and social history reviewed and updated as indicated. Interim medical history since our last visit reviewed. Allergies and medications reviewed and updated.  Review of Systems  Constitutional: Negative for activity change, diaphoresis, fatigue and fever.  Respiratory: Negative for cough, chest tightness, shortness of breath and wheezing.   Cardiovascular: Negative for chest pain, palpitations and leg swelling.  Gastrointestinal: Negative for abdominal distention, abdominal pain, constipation, diarrhea, nausea and vomiting.  Endocrine: Negative for cold intolerance, heat intolerance, polydipsia, polyphagia and polyuria.  Neurological: Negative for dizziness, syncope, weakness, light-headedness, numbness and headaches.  Psychiatric/Behavioral: Negative.     Per HPI unless specifically  indicated above  Objective:    BP 132/78 (BP Location: Left Arm, Patient Position: Sitting)   Pulse 63   Temp 98.1 F (36.7 C) (Oral)   SpO2 96%   Wt Readings from Last 3 Encounters:  08/19/19 204 lb (92.5 kg)  06/23/19 208 lb (94.3 kg)  05/12/19 206 lb (93.4 kg)    Physical Exam Vitals signs and nursing note reviewed.  Constitutional:      General: He is awake. He is not in acute distress.    Appearance: He is well-developed. He is not ill-appearing.  HENT:     Head: Normocephalic and atraumatic.     Right Ear: Hearing normal. No drainage.     Left Ear: Hearing normal. No drainage.  Eyes:     General: Lids are normal.        Right eye: No discharge.        Left eye: No discharge.     Conjunctiva/sclera: Conjunctivae normal.     Pupils: Pupils are equal, round, and reactive to light.  Neck:     Musculoskeletal: Normal range of motion and neck supple.     Thyroid: No thyromegaly.     Vascular: No carotid bruit.  Cardiovascular:     Rate and Rhythm: Normal rate and regular rhythm.     Heart sounds: Normal heart sounds, S1 normal and S2 normal. No murmur. No gallop.   Pulmonary:     Effort: Pulmonary effort is normal. No accessory muscle usage or respiratory distress.     Breath sounds: Normal breath sounds.  Abdominal:     General: Bowel sounds are normal.     Palpations: Abdomen is soft.  Musculoskeletal: Normal range of motion.     Right shoulder: He exhibits tenderness and pain. He exhibits normal range of motion, no swelling, no laceration and normal strength.     Left shoulder: He exhibits normal range of motion, no tenderness, no swelling, no pain, no spasm and normal strength.     Right lower leg: No edema.     Left lower leg: No edema.     Comments: No rashes noted to skin.  Right shoulder with mild tenderness to palpation of upper deltoid.  Full ROM, although tenderness noted with flexion and internal rotation.    Skin:    General: Skin is warm and dry.   Neurological:     Mental Status: He is alert and oriented to person, place, and time.  Psychiatric:        Attention and Perception: Attention normal.        Mood and Affect: Mood normal.        Speech: Speech normal.        Behavior: Behavior normal. Behavior is cooperative.        Thought Content: Thought content normal.        Judgment: Judgment normal.     Results for orders placed or performed in visit on 08/19/19  Bayer DCA Hb A1c Waived  Result Value Ref Range   HB A1C (BAYER DCA - WAIVED) 7.0 (H) <7.0 %  Microalbumin, Urine Waived  Result Value Ref Range   Microalb, Ur Waived 10 0 - 19 mg/L   Creatinine, Urine Waived 50 10 - 300 mg/dL   Microalb/Creat Ratio <30 <30 mg/g      Assessment & Plan:   Problem List Items Addressed This Visit      Cardiovascular and Mediastinum   Hypertensive heart disease    Chronic, ongoing with initial BP  elevated but repeat BP at goal today.  Continue current medication regimen and adjust as needed.  Continue collaboration with cardiology.  Recommend he monitor BP at least a few times a week at home and document.  CMP this visit.      Relevant Medications   atorvastatin (LIPITOR) 20 MG tablet   amLODipine (NORVASC) 5 MG tablet   metoprolol tartrate (LOPRESSOR) 25 MG tablet   Aortic atherosclerosis (HCC)    Continue ASA and Lipitor daily for prevention.  Quit smoking 2 years ago.        Relevant Medications   atorvastatin (LIPITOR) 20 MG tablet   amLODipine (NORVASC) 5 MG tablet   metoprolol tartrate (LOPRESSOR) 25 MG tablet     Respiratory   COPD (chronic obstructive pulmonary disease) (HCC)    Chronic, stable with minimal use of Albuterol.  Continue to monitor and plan for spirometry at upcoming visit.  Send refills of Albuterol as needed.      Relevant Medications   montelukast (SINGULAIR) 10 MG tablet     Endocrine   Type 2 diabetes mellitus with hyperglycemia, without long-term current use of insulin (HCC) - Primary     Chronic, ongoing.  A1C continues at 6.9%, improved from previous visit. Continue current medication regimen and checking BS at home daily.  Return in 3 months for follow-up.        Relevant Medications   atorvastatin (LIPITOR) 20 MG tablet   metFORMIN (GLUCOPHAGE) 1000 MG tablet   Other Relevant Orders   Comprehensive metabolic panel   Bayer DCA Hb N8GA1c Waived   Hyperlipidemia associated with type 2 diabetes mellitus (HCC)    Chronic, ongoing.  Continue current medication regimen and adjust as needed.  Lipid panel and CMP today.      Relevant Medications   atorvastatin (LIPITOR) 20 MG tablet   metFORMIN (GLUCOPHAGE) 1000 MG tablet   Other Relevant Orders   Comprehensive metabolic panel   Lipid Panel w/o Chol/HDL Ratio out     Other   Right shoulder pain    Acute.  Does not wish to trial a short Prednisone burst, suspect tendinopathy or impingement based on exam.  He reports some improvement and wishes to continue simple treatment at home.  Recommend alternate heat/ice + continue Tylenol as needed and Voltaren gel.  Sent script in for Tizanidine per patient request to use as needed for muscle spasms.  Return for worsening or ongoing pain.        Other Visit Diagnoses    Need for influenza vaccination       Relevant Orders   Flu Vaccine QUAD High Dose(Fluad) (Completed)       Follow up plan: Return in about 3 months (around 02/23/2020) for T2DM, HTN/HLD, COPD, PVD.

## 2019-11-23 NOTE — Assessment & Plan Note (Signed)
Acute.  Does not wish to trial a short Prednisone burst, suspect tendinopathy or impingement based on exam.  He reports some improvement and wishes to continue simple treatment at home.  Recommend alternate heat/ice + continue Tylenol as needed and Voltaren gel.  Sent script in for Tizanidine per patient request to use as needed for muscle spasms.  Return for worsening or ongoing pain.

## 2019-11-23 NOTE — Assessment & Plan Note (Signed)
Continue ASA and Lipitor daily for prevention.  Quit smoking 2 years ago.   

## 2019-11-23 NOTE — Assessment & Plan Note (Signed)
Chronic, ongoing.  Continue current medication regimen and adjust as needed.  Lipid panel and CMP today.    

## 2019-11-23 NOTE — Assessment & Plan Note (Signed)
Chronic, ongoing.  A1C continues at 6.9%, improved from previous visit. Continue current medication regimen and checking BS at home daily.  Return in 3 months for follow-up.

## 2019-11-24 LAB — LIPID PANEL W/O CHOL/HDL RATIO
Cholesterol, Total: 117 mg/dL (ref 100–199)
HDL: 28 mg/dL — ABNORMAL LOW (ref >39)
LDL Chol Calc (NIH): 48 mg/dL (ref 0–99)
Triglycerides: 262 mg/dL — ABNORMAL HIGH (ref 0–149)
VLDL Cholesterol Cal: 41 mg/dL — ABNORMAL HIGH (ref 5–40)

## 2019-11-24 LAB — COMPREHENSIVE METABOLIC PANEL
ALT: 24 IU/L (ref 0–44)
AST: 16 IU/L (ref 0–40)
Albumin/Globulin Ratio: 2.4 — ABNORMAL HIGH (ref 1.2–2.2)
Albumin: 4.8 g/dL (ref 3.8–4.8)
Alkaline Phosphatase: 50 IU/L (ref 39–117)
BUN/Creatinine Ratio: 16 (ref 10–24)
BUN: 20 mg/dL (ref 8–27)
Bilirubin Total: <0.2 mg/dL (ref 0.0–1.2)
CO2: 23 mmol/L (ref 20–29)
Calcium: 9.4 mg/dL (ref 8.6–10.2)
Chloride: 103 mmol/L (ref 96–106)
Creatinine, Ser: 1.26 mg/dL (ref 0.76–1.27)
GFR calc Af Amer: 67 mL/min/{1.73_m2} (ref >59)
GFR calc non Af Amer: 58 mL/min/{1.73_m2} — ABNORMAL LOW (ref >59)
Globulin, Total: 2 g/dL (ref 1.5–4.5)
Glucose: 136 mg/dL — ABNORMAL HIGH (ref 65–99)
Potassium: 3.8 mmol/L (ref 3.5–5.2)
Sodium: 145 mmol/L — ABNORMAL HIGH (ref 134–144)
Total Protein: 6.8 g/dL (ref 6.0–8.5)

## 2019-12-22 ENCOUNTER — Telehealth: Payer: Self-pay

## 2019-12-28 ENCOUNTER — Other Ambulatory Visit: Payer: Self-pay | Admitting: Nurse Practitioner

## 2019-12-29 ENCOUNTER — Ambulatory Visit (INDEPENDENT_AMBULATORY_CARE_PROVIDER_SITE_OTHER): Payer: Medicare Other | Admitting: Nurse Practitioner

## 2019-12-29 ENCOUNTER — Encounter: Payer: Self-pay | Admitting: Nurse Practitioner

## 2019-12-29 ENCOUNTER — Other Ambulatory Visit: Payer: Self-pay

## 2019-12-29 DIAGNOSIS — U071 COVID-19: Secondary | ICD-10-CM | POA: Insufficient documentation

## 2019-12-29 DIAGNOSIS — R05 Cough: Secondary | ICD-10-CM

## 2019-12-29 DIAGNOSIS — Z8616 Personal history of COVID-19: Secondary | ICD-10-CM | POA: Insufficient documentation

## 2019-12-29 DIAGNOSIS — R059 Cough, unspecified: Secondary | ICD-10-CM

## 2019-12-29 DIAGNOSIS — I2510 Atherosclerotic heart disease of native coronary artery without angina pectoris: Secondary | ICD-10-CM

## 2019-12-29 MED ORDER — BENZONATATE 100 MG PO CAPS: 100.0000 mg | ORAL_CAPSULE | Freq: Two times a day (BID) | ORAL | 0 refills | Status: DC | PRN

## 2019-12-29 MED ORDER — AZITHROMYCIN 250 MG PO TABS: ORAL_TABLET | ORAL | 0 refills | Status: DC

## 2019-12-29 NOTE — Assessment & Plan Note (Signed)
Acute.  Will obtain Covid testing, discussed with patient.  Have recommended maintaining home quarantine at this time based on current symptoms, discussed instructions for Covid testing and quarantine recommendations with testing.  Patient agrees with this POC.  Is a smoker, possible bronchitis will send in script for Azithromycin due to symptoms for 2 weeks and Tessalon.  For worsening or ongoing symptoms return to office.  If Covid positive will f/u with patient in 14 days.

## 2019-12-29 NOTE — Progress Notes (Signed)
There were no vitals taken for this visit.   Subjective:    Patient ID: Brendan [name], male    DOB: 10/27/1951, 68 y.o.   MRN: [id number]  HPI: Brendan [name] is a 68 y.o. male  Chief Complaint  Patient presents with  . URI    pt states he has been having a cough, chills, no energy, and just feeling bad for the last 2 weeks     . This visit was completed via telephone due to the restrictions of the COVID-19 pandemic. All issues as above were discussed and addressed but no physical exam was performed. If it was felt that the patient should be evaluated in the office, they were directed there. The patient verbally consented to this visit. Patient was unable to complete an audio/visual visit due to Lack of equipment. Due to the catastrophic nature of the COVID-19 pandemic, this visit was done through audio contact only. . Location of the patient: home . Location of the provider: work . Those involved with this call:  . Provider: [name], DNP . CMA: Yvonna Alanis, CMA . Front Desk/Registration: Jill Side  . Time spent on call: 15 minutes on the phone discussing health concerns. 10 minutes total spent in review of patient's record and preparation of their chart.  . I verified patient identity using two factors (patient name and date of birth). Patient consents verbally to being seen via telemedicine visit today.    UPPER RESPIRATORY TRACT INFECTION Has been feeling bad for two weeks.  Having chills initially, then this improved and would come back.  No longer having chills, but still feels run down.  Decreased appetite and mild cough (clear phlegm).  Coffee did not taste right this morning, denies loss of taste or smell.   Fever: no Cough: yes Shortness of breath: no Wheezing: no Chest pain: no Chest tightness: no Chest congestion: no Nasal congestion: no Runny nose: a little bit Post nasal drip: no Sneezing: no Sore throat: no Swollen glands: no Sinus pressure:  yes Headache: yes Face pain: no Toothache: no Ear pain: none Ear pressure: none Eyes red/itching:no Eye drainage/crusting: no  Vomiting: no Rash: no Fatigue: yes Sick contacts: no Relief with OTC cold/cough medications: no  Treatments attempted: mucinex and anti-histamine   Relevant past medical, surgical, family and social history reviewed and updated as indicated. Interim medical history since our last visit reviewed. Allergies and medications reviewed and updated.  Review of Systems  Constitutional: Positive for chills and fatigue. Negative for activity change and diaphoresis.  HENT: Positive for postnasal drip and rhinorrhea. Negative for congestion, ear discharge, ear pain, sinus pressure, sinus pain, sneezing, sore throat and voice change.   Respiratory: Positive for cough. Negative for chest tightness, shortness of breath and wheezing.   Cardiovascular: Negative for chest pain, palpitations and leg swelling.  Gastrointestinal: Negative.   Musculoskeletal: Negative for myalgias.  Neurological: Negative for headaches.  Psychiatric/Behavioral: Negative.     Per HPI unless specifically indicated above     Objective:    There were no vitals taken for this visit.  Wt Readings from Last 3 Encounters:  08/19/19 204 lb (92.5 kg)  06/23/19 208 lb (94.3 kg)  05/12/19 206 lb (93.4 kg)    Physical Exam   Unable to perform due to telephone visit only.  Results for orders placed or performed in visit on 11/23/19  Comprehensive metabolic panel  Result Value Ref Range   Glucose 136 (H) 65 - 99 mg/dL  BUN 20 8 - 27 mg/dL   Creatinine, Ser 2.77 0.76 - 1.27 mg/dL   GFR calc non Af Amer 58 (L) >59 mL/min/1.73   GFR calc Af Amer 67 >59 mL/min/1.73   BUN/Creatinine Ratio 16 10 - 24   Sodium 145 (H) 134 - 144 mmol/L   Potassium 3.8 3.5 - 5.2 mmol/L   Chloride 103 96 - 106 mmol/L   CO2 23 20 - 29 mmol/L   Calcium 9.4 8.6 - 10.2 mg/dL   Total Protein 6.8 6.0 - 8.5 g/dL    Albumin 4.8 3.8 - 4.8 g/dL   Globulin, Total 2.0 1.5 - 4.5 g/dL   Albumin/Globulin Ratio 2.4 (H) 1.2 - 2.2   Bilirubin Total <0.2 0.0 - 1.2 mg/dL   Alkaline Phosphatase 50 39 - 117 IU/L   AST 16 0 - 40 IU/L   ALT 24 0 - 44 IU/L  Lipid Panel w/o Chol/HDL Ratio out  Result Value Ref Range   Cholesterol, Total 117 100 - 199 mg/dL   Triglycerides 824 (H) 0 - 149 mg/dL   HDL 28 (L) >23 mg/dL   VLDL Cholesterol Cal 41 (H) 5 - 40 mg/dL   LDL Chol Calc (NIH) 48 0 - 99 mg/dL  Bayer DCA Hb N3I Waived  Result Value Ref Range   HB A1C (BAYER DCA - WAIVED) 6.9 <7.0 %      Assessment & Plan:   Problem List Items Addressed This Visit      Other   Cough - Primary    Acute.  Will obtain Covid testing, discussed with patient.  Have recommended maintaining home quarantine at this time based on current symptoms, discussed instructions for Covid testing and quarantine recommendations with testing.  Patient agrees with this POC.  Is a smoker, possible bronchitis will send in script for Azithromycin due to symptoms for 2 weeks and Tessalon.  For worsening or ongoing symptoms return to office.  If Covid positive will f/u with patient in 14 days.      Relevant Orders   Novel Coronavirus, NAA ([facility])      I discussed the assessment and treatment plan with the patient. The patient was provided an opportunity to ask questions and all were answered. The patient agreed with the plan and demonstrated an understanding of the instructions.   The patient was advised to call back or seek an in-person evaluation if the symptoms worsen or if the condition fails to improve as anticipated.   I provided 15 minutes of time during this encounter.  Follow up plan: Return if symptoms worsen or fail to improve.

## 2019-12-29 NOTE — Patient Instructions (Signed)

## 2019-12-30 ENCOUNTER — Ambulatory Visit: Payer: Medicare Other | Attending: Internal Medicine

## 2019-12-30 DIAGNOSIS — Z20822 Contact with and (suspected) exposure to covid-19: Secondary | ICD-10-CM

## 2020-01-01 LAB — NOVEL CORONAVIRUS, NAA: SARS-CoV-2, NAA: DETECTED — AB

## 2020-01-05 ENCOUNTER — Telehealth: Payer: Self-pay

## 2020-01-05 ENCOUNTER — Ambulatory Visit (INDEPENDENT_AMBULATORY_CARE_PROVIDER_SITE_OTHER): Payer: Medicare Other | Admitting: Pharmacist

## 2020-01-05 DIAGNOSIS — I119 Hypertensive heart disease without heart failure: Secondary | ICD-10-CM

## 2020-01-05 DIAGNOSIS — E1165 Type 2 diabetes mellitus with hyperglycemia: Secondary | ICD-10-CM | POA: Diagnosis not present

## 2020-01-05 DIAGNOSIS — Z951 Presence of aortocoronary bypass graft: Secondary | ICD-10-CM

## 2020-01-05 DIAGNOSIS — I2510 Atherosclerotic heart disease of native coronary artery without angina pectoris: Secondary | ICD-10-CM | POA: Diagnosis not present

## 2020-01-05 NOTE — Chronic Care Management (AMB) (Signed)
Chronic Care Management   Follow Up Note   01/05/2020 Name: Brendan [name] MRN: [id number] DOB: 16-Oct-1951  Referred by: Marjie Skiff, NP Reason for referral : Chronic Care Management (Medication Management)   Brendan [name] is a 69 y.o. year old male who is a primary care patient of [name], Dorie Rank, NP. The CCM team was consulted for assistance with chronic disease management and care coordination needs.    Contacted patient for medication management review.   Review of patient status, including review of consultants reports, relevant laboratory and other test results, and collaboration with appropriate care team members and the patient's provider was performed as part of comprehensive patient evaluation and provision of chronic care management services.    SDOH (Social Determinants of Health) screening performed today: None. See Care Plan for related entries.   Outpatient Encounter Medications as of 01/05/2020  Medication Sig Note  . benzonatate (TESSALON PERLES) 100 MG capsule Take 1 capsule (100 mg total) by mouth 2 (two) times daily as needed for cough.   . metFORMIN (GLUCOPHAGE) 1000 MG tablet Take 1 tablet (1,000 mg total) by mouth 2 (two) times daily with a meal.   . acetaminophen (TYLENOL) 500 MG tablet Take 1 tablet (500 mg total) by mouth every 6 (six) hours as needed for mild pain or headache. 11/11/2019: Taking 1000 mg daily to twice daily   . albuterol (PROVENTIL HFA;VENTOLIN HFA) 108 (90 Base) MCG/ACT inhaler Inhale 2 puffs into the lungs every 6 (six) hours as needed for wheezing or shortness of breath.   [name] amLODipine (NORVASC) 5 MG tablet Take 1 tablet (5 mg total) by mouth daily.   [name] aspirin EC 81 MG tablet Take 1 tablet (81 mg total) by mouth daily.   [name] atorvastatin (LIPITOR) 20 MG tablet Take 1 tablet (20 mg total) by mouth daily.   [name] azelastine (ASTELIN) 0.1 % nasal spray Place 2 sprays into both nostrils at bedtime.    [name] azithromycin (ZITHROMAX) 250 MG tablet Take 2  tablets by mouth (500 MG) on day 1 and then 1 tablet (250 MG) on days 2 to 5.   . chlorthalidone (HYGROTON) 25 MG tablet Take 1 tablet (25 mg total) by mouth daily.   . furosemide (LASIX) 20 MG tablet Take 1 tablet (20 mg total) by mouth daily.   [name] loratadine (CLARITIN) 10 MG tablet Take 1 tablet (10 mg total) by mouth daily.   . metoprolol tartrate (LOPRESSOR) 25 MG tablet Take 0.5 tablets (12.5 mg total) by mouth 2 (two) times daily.   . montelukast (SINGULAIR) 10 MG tablet Take 1 tablet (10 mg total) by mouth at bedtime.   . Potassium Chloride ER 20 MEQ TBCR Take 2 tablets (40 meq) by mouth once daily   . sildenafil (REVATIO) 20 MG tablet TAKE 1 TO 5 TABLETS AS NEEDED.   [name] tamsulosin (FLOMAX) 0.4 MG CAPS capsule Take 1 capsule (0.4 mg total) by mouth daily.   [name] tiZANidine (ZANAFLEX) 4 MG tablet Take 1 tablet (4 mg total) by mouth every 6 (six) hours as needed for muscle spasms.    No facility-administered encounter medications on file as of 01/05/2020.     Goals Addressed            This Visit's Progress     Patient Stated   . PharmD "I want to control my diabetes" (pt-stated)       Current Barriers:  . 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 Current antihyperglycemic regimen: metformin 1000 mg BID . Current meal patterns:  o Notes he "hasn't been eating as well" d/t COVID and feeling poorly . Current blood glucose readings:  o Reports fastings in 150s . Cardiovascular risk reduction: o Current hypertensive regimen: amlodipine 5 mg daily, chlorthalidone 25 mg daily, furosemide 20 mg daily, metoprolol tartrate 12.5 mg BID; does not have BP cuff at home o Current hyperlipidemia regimen: atorvastatin 20 mg daily; LDL at goal <70  Pharmacist Clinical Goal(s):  [name] Over the next 90 days, patient with work with PharmD and primary care provider to address optimized self-management of  diabetes  Interventions: . Comprehensive medication review performed, medication list updated in electronic medical record . Reviewed upcoming appointment w/ PCP  . Will investigate options for helping patient w/ BP cuff  Patient Self Care Activities:  . Patient will check blood glucose daily , document, and provide at future appointments . Patient will take medications as prescribed . Patient will contact provider with any episodes of hypoglycemia . Patient will report any questions or concerns to provider   Please see past updates related to this goal by clicking on the "Past Updates" button in the selected goal          Plan: - Will investigate any assistance options for BP cuff.  - Scheduled telephonic follow up 03/15/20 @ 3 pm  Catie Darnelle Maffucci, PharmD, [place] [phone]

## 2020-01-05 NOTE — Patient Instructions (Signed)
Visit Information  Goals Addressed            This Visit's Progress     Patient Stated   . PharmD "I want to control my diabetes" (pt-stated)       Current Barriers:  . 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 Current antihyperglycemic regimen: metformin 1000 mg BID . Current meal patterns:  o Notes he "hasn't been eating as well" d/t COVID and feeling poorly . Current blood glucose readings:  o Reports fastings in 150s . Cardiovascular risk reduction: o Current hypertensive regimen: amlodipine 5 mg daily, chlorthalidone 25 mg daily, furosemide 20 mg daily, metoprolol tartrate 12.5 mg BID; does not have BP cuff at home o Current hyperlipidemia regimen: atorvastatin 20 mg daily; LDL at goal <70  Pharmacist Clinical Goal(s):  [name] Over the next 90 days, patient with work with PharmD and primary care provider to address optimized self-management of diabetes  Interventions: . Comprehensive medication review performed, medication list updated in electronic medical record . Reviewed upcoming appointment w/ PCP  . Will investigate options for helping patient w/ BP cuff  Patient Self Care Activities:  . Patient will check blood glucose daily , document, and provide at future appointments . Patient will take medications as prescribed . Patient will contact provider with any episodes of hypoglycemia . Patient will report any questions or concerns to provider   Please see past updates related to this goal by clicking on the "Past Updates" button in the selected goal         The patient verbalized understanding of instructions provided today and declined a print copy of patient instruction materials.   Plan: - Will investigate any assistance options for BP cuff.  - Scheduled telephonic follow up 03/15/20 @ 3 pm  [name], PharmD, [facility] Clinical Pharmacist [facility] [place] [phone]

## 2020-01-06 ENCOUNTER — Ambulatory Visit: Payer: Self-pay | Admitting: Pharmacist

## 2020-01-06 ENCOUNTER — Encounter: Payer: Self-pay | Admitting: Nurse Practitioner

## 2020-01-06 DIAGNOSIS — I119 Hypertensive heart disease without heart failure: Secondary | ICD-10-CM

## 2020-01-06 DIAGNOSIS — I2510 Atherosclerotic heart disease of native coronary artery without angina pectoris: Secondary | ICD-10-CM

## 2020-01-06 DIAGNOSIS — E1165 Type 2 diabetes mellitus with hyperglycemia: Secondary | ICD-10-CM

## 2020-01-06 NOTE — Progress Notes (Signed)
Covid letter.

## 2020-01-06 NOTE — Patient Instructions (Signed)
Visit Information  Goals Addressed            This Visit's Progress     Patient Stated   . PharmD "I want to control my diabetes" (pt-stated)       Current Barriers:  . 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 Current antihyperglycemic regimen: metformin 1000 mg BID . Current meal patterns:  o Notes he "hasn't been eating as well" d/t COVID and feeling poorly . Current blood glucose readings:  o Reports fastings in 150s . Cardiovascular risk reduction: o Current hypertensive regimen: amlodipine 5 mg daily, chlorthalidone 25 mg daily, furosemide 20 mg daily, metoprolol tartrate 12.5 mg BID; does not have BP cuff at home, but interested in one o Current hyperlipidemia regimen: atorvastatin 20 mg daily; LDL at goal <70  Pharmacist Clinical Goal(s):  [name] Over the next 90 days, patient with work with PharmD and primary care provider to address optimized self-management of diabetes  Interventions: . Collaborated w/ [facility]. As patient is part of [facility] contract, a BP machine can be provided to him for free. Requested that this be mailed to him.  . Contacted patient, let him know this. He was appreciative. Notes his quarantine is supposed to be lifted on Friday, but worried that since he is still tired, he won't be able to return to work. Notes that he needs a letter sent to work to notify them of his COVID diagnosis. Will discuss w/ Jolene [name] to see what the office has been providing for other patients.   Patient Self Care Activities:  . Patient will check blood glucose daily , document, and provide at future appointments . Patient will take medications as prescribed . Patient will contact provider with any episodes of hypoglycemia . Patient will report any questions or concerns to provider   Please see past updates related to this goal by clicking on the "Past  Updates" button in the selected goal         The patient verbalized understanding of instructions provided today and declined a print copy of patient instruction materials.   Plan: - Will collaborate w/ [facility] staff to mail patient a BP machine - Will collaborate w/ PCP on patient needs as above - Will outreach patient as previously scheduled  [name], PharmD, [facility] Clinical Pharmacist [facility] [phone]

## 2020-01-06 NOTE — Chronic Care Management (AMB) (Signed)
Chronic Care Management   Follow Up Note   01/06/2020 Name: Brendan [name] MRN: [id number] DOB: 1951-10-22  Referred by: Brendan Skiff, NP Reason for referral : Chronic Care Management (Medication Management)   [name] is a 69 y.o. year old male who is a primary care patient of [name], Brendan Rank, NP. The CCM team was consulted for assistance with chronic disease management and care coordination needs.    Care coordination completed today.  Review of patient status, including review of consultants reports, relevant laboratory and other test results, and collaboration with appropriate care team members and the patient's provider was performed as part of comprehensive patient evaluation and provision of chronic care management services.    SDOH (Social Determinants of Health) screening performed today: Financial Strain . See Care Plan for related entries.   Outpatient Encounter Medications as of 01/06/2020  Medication Sig Note  . acetaminophen (TYLENOL) 500 MG tablet Take 1 tablet (500 mg total) by mouth every 6 (six) hours as needed for mild pain or headache. 11/11/2019: Taking 1000 mg daily to twice daily   . albuterol (PROVENTIL HFA;VENTOLIN HFA) 108 (90 Base) MCG/ACT inhaler Inhale 2 puffs into the lungs every 6 (six) hours as needed for wheezing or shortness of breath.   Brendan [name] amLODipine (NORVASC) 5 MG tablet Take 1 tablet (5 mg total) by mouth daily.   Brendan [name] aspirin EC 81 MG tablet Take 1 tablet (81 mg total) by mouth daily.   Brendan [name] atorvastatin (LIPITOR) 20 MG tablet Take 1 tablet (20 mg total) by mouth daily.   Brendan [name] azelastine (ASTELIN) 0.1 % nasal spray Place 2 sprays into both nostrils at bedtime.    Brendan [name] azithromycin (ZITHROMAX) 250 MG tablet Take 2 tablets by mouth (500 MG) on day 1 and then 1 tablet (250 MG) on days 2 to 5.   . benzonatate (TESSALON PERLES) 100 MG capsule Take 1 capsule (100 mg total) by mouth 2 (two) times daily as needed for cough.   . chlorthalidone (HYGROTON) 25 MG tablet  Take 1 tablet (25 mg total) by mouth daily.   . furosemide (LASIX) 20 MG tablet Take 1 tablet (20 mg total) by mouth daily.   Brendan [name] loratadine (CLARITIN) 10 MG tablet Take 1 tablet (10 mg total) by mouth daily.   . metFORMIN (GLUCOPHAGE) 1000 MG tablet Take 1 tablet (1,000 mg total) by mouth 2 (two) times daily with a meal.   . metoprolol tartrate (LOPRESSOR) 25 MG tablet Take 0.5 tablets (12.5 mg total) by mouth 2 (two) times daily.   . montelukast (SINGULAIR) 10 MG tablet Take 1 tablet (10 mg total) by mouth at bedtime.   . Potassium Chloride ER 20 MEQ TBCR Take 2 tablets (40 meq) by mouth once daily   . sildenafil (REVATIO) 20 MG tablet TAKE 1 TO 5 TABLETS AS NEEDED.   Brendan [name] tamsulosin (FLOMAX) 0.4 MG CAPS capsule Take 1 capsule (0.4 mg total) by mouth daily.   Brendan [name] tiZANidine (ZANAFLEX) 4 MG tablet Take 1 tablet (4 mg total) by mouth every 6 (six) hours as needed for muscle spasms.    No facility-administered encounter medications on file as of 01/06/2020.     Goals Addressed            This Visit's Progress     Patient Stated   . PharmD "I want to control my diabetes" (pt-stated)       Current Barriers:  . 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 Current antihyperglycemic regimen: metformin 1000 mg BID . Current meal patterns:  o Notes he "hasn't been eating as well" d/t COVID and feeling poorly . Current blood glucose readings:  o Reports fastings in 150s . Cardiovascular risk reduction: o Current hypertensive regimen: amlodipine 5 mg daily, chlorthalidone 25 mg daily, furosemide 20 mg daily, metoprolol tartrate 12.5 mg BID; does not have BP cuff at home, but interested in one o Current hyperlipidemia regimen: atorvastatin 20 mg daily; LDL at goal <70  Pharmacist Clinical Goal(s):  Brendan [name] Over the next 90 days, patient with work with PharmD and primary care provider to address optimized self-management of  diabetes  Interventions: . Collaborated w/ [facility] administrator. As patient is part of [facility] contract, a BP machine can be provided to him for free. Requested that this be mailed to him.  . Contacted patient, let him know this. He was appreciative. Notes his quarantine is supposed to be lifted on Friday, but worried that since he is still tired, he won't be able to return to work. Notes that he needs a letter sent to work to notify them of his COVID diagnosis. Will discuss w/ Jolene [name] to see what the office has been providing for other patients.   Patient Self Care Activities:  . Patient will check blood glucose daily , document, and provide at future appointments . Patient will take medications as prescribed . Patient will contact provider with any episodes of hypoglycemia . Patient will report any questions or concerns to provider   Please see past updates related to this goal by clicking on the "Past Updates" button in the selected goal          Plan: - Will collaborate w/ [facility] staff to mail patient a BP machine - Will collaborate w/ PCP on patient needs as above - Will outreach patient as previously scheduled  Catie Darnelle Maffucci, PharmD, [place] [phone]

## 2020-01-08 ENCOUNTER — Telehealth: Payer: Self-pay | Admitting: Pharmacist

## 2020-01-08 ENCOUNTER — Telehealth: Payer: Self-pay | Admitting: Nurse Practitioner

## 2020-01-08 NOTE — Telephone Encounter (Signed)
-----   Message from Andrew Au, RN sent at 01/08/2020  1:47 PM EST ----- Regarding: FW: Diabetic Shoes Could you ladies please put Brendan [name] on Dr. Henriette Combs schedule for a virtual visit to obtain diabetic shoes.  They will not accept an NP's signature on these. I tried sorry. He was working with [facility] to get the shoes.   Ma Rings  ----- Message ----- From: Marjie Skiff, NP Sent: 12/30/2019   9:32 PM EST To: Theadora Rama Minor, RN Subject: RE: Diabetic Shoes                             Do you know he has still not received his shoes.  UGH!!! ----- Message ----- From: Andrew Au, RN Sent: 11/11/2019  10:15 PM EST To: Marjie Skiff, NP, Dorcas Carrow, DO Subject: Diabetic Shoes                                 Hi Ladies,   This patient is need of diabetic shoes. I had him do a virtual visit with Dr. Dossie Arbour but he did not sign off on it. It has to be approved by an MD specifically for Medicare.  The bottom line is this patient has a very low income,(800$/month) he continues to work part time just to make ends meet. He rides a scooter to work even in the cold, and is on his feet the whole time at work. He states he is unable to afford good shoes and with his diabetes I feel its an issue waiting to happen.   Dr. Laural Benes would you be willing to assess him for these, [facility] stated it could be a telephone visit?   Thank you!  Ma Rings Minor RN, BSN Nurse Case Education officer, community Family Practice/[facility]  [phone]) [employer]

## 2020-01-08 NOTE — Telephone Encounter (Signed)
Called X2 unable to reach or leave voicemail,in regards to making apt with Dr.Johnson, for diabetic shoes.

## 2020-01-08 NOTE — Telephone Encounter (Signed)
Error

## 2020-01-11 ENCOUNTER — Ambulatory Visit: Payer: Medicare Other | Attending: Internal Medicine

## 2020-01-14 ENCOUNTER — Encounter: Payer: Self-pay | Admitting: Nurse Practitioner

## 2020-01-14 ENCOUNTER — Other Ambulatory Visit: Payer: Self-pay

## 2020-01-14 ENCOUNTER — Ambulatory Visit (INDEPENDENT_AMBULATORY_CARE_PROVIDER_SITE_OTHER): Payer: Medicare Other | Admitting: Nurse Practitioner

## 2020-01-14 DIAGNOSIS — U071 COVID-19: Secondary | ICD-10-CM | POA: Diagnosis not present

## 2020-01-14 DIAGNOSIS — I2510 Atherosclerotic heart disease of native coronary artery without angina pectoris: Secondary | ICD-10-CM

## 2020-01-14 NOTE — Progress Notes (Signed)
Temp 98.4 F (36.9 C) (Oral)    Subjective:    Patient ID: Brendan [name], male    DOB: 07-07-1951, 69 y.o.   MRN: [id number]  HPI: Brendan [name] is a 69 y.o. male  Chief Complaint  Patient presents with  . Follow-up    COVID f/up    . This visit was completed via telephone due to the restrictions of the COVID-19 pandemic. All issues as above were discussed and addressed but no physical exam was performed. If it was felt that the patient should be evaluated in the office, they were directed there. The patient verbally consented to this visit. Patient was unable to complete an audio/visual visit due to Lack of equipment. Due to the catastrophic nature of the COVID-19 pandemic, this visit was done through audio contact only. . Location of the patient: home . Location of the provider: home . Those involved with this call:  . Provider: [name], DNP . CMA: Yvonna Alanis, CMA . Front Desk/Registration: Don Perking  . Time spent on call: 15 minutes on the phone discussing health concerns. 10 minutes total spent in review of patient's record and preparation of their chart.  . I verified patient identity using two factors (patient name and date of birth). Patient consents verbally to being seen via telemedicine visit today.    COVID FOLLOW-UP Currently has some mild weakness and a decreased appetite, but reports he is about 75% better.  But he is starting to eat more again.   Fever: no Cough: no Shortness of breath: no Wheezing: no Chest pain: no Chest tightness: no Chest congestion: no Nasal congestion: no Runny nose: no Post nasal drip: no Sneezing: no Sore throat: no Swollen glands: no Sinus pressure: no Headache: no Face pain: no Toothache: no Ear pain: none Ear pressure: none Eyes red/itching:no Eye drainage/crusting: no  Vomiting: no Rash: no Fatigue: yes Sick contacts: no Strep contacts: no  Context: stable  Relevant past medical, surgical, family  and social history reviewed and updated as indicated. Interim medical history since our last visit reviewed. Allergies and medications reviewed and updated.  Review of Systems  Constitutional: Positive for fatigue. Negative for activity change, diaphoresis and fever.  Respiratory: Negative for cough, chest tightness, shortness of breath and wheezing.   Cardiovascular: Negative for chest pain, palpitations and leg swelling.  Psychiatric/Behavioral: Negative.     Per HPI unless specifically indicated above     Objective:    Temp 98.4 F (36.9 C) (Oral)   Wt Readings from Last 3 Encounters:  08/19/19 204 lb (92.5 kg)  06/23/19 208 lb (94.3 kg)  05/12/19 206 lb (93.4 kg)    Physical Exam  Unable to perform due to telephone visit only.  Results for orders placed or performed in visit on 12/30/19  Novel Coronavirus, NAA ([facility])   Specimen: Nasopharyngeal(NP) swabs in vial transport medium   NASOPHARYNGE  TESTING  Result Value Ref Range   SARS-CoV-2, NAA Detected (A) Not Detected      Assessment & Plan:   Problem List Items Addressed This Visit      Other   Lab test positive for detection of COVID-19 virus    Acute and feeling 75% better.  Is okay to return to work on Monday, off quarantine at that time and has note from health department.  Instructed him to take it easy on return to work and not push too hard.  Recommend continue daily Vit D and Zinc + get plenty of rest  and hydration.  Return to office as scheduled in February or sooner if worsening.         I discussed the assessment and treatment plan with the patient. The patient was provided an opportunity to ask questions and all were answered. The patient agreed with the plan and demonstrated an understanding of the instructions.   The patient was advised to call back or seek an in-person evaluation if the symptoms worsen or if the condition fails to improve as anticipated.   I provided 15 minutes of time during  this encounter.  Follow up plan: Return if symptoms worsen or fail to improve, for as scheduled in February.

## 2020-01-14 NOTE — Patient Instructions (Signed)
COVID-19 COVID-19 is a respiratory infection that is caused by a virus called severe acute respiratory syndrome coronavirus 2 (SARS-CoV-2). The disease is also known as coronavirus disease or novel coronavirus. In some people, the virus may not cause any symptoms. In others, it may cause a serious infection. The infection can get worse quickly and can lead to complications, such as:  Pneumonia, or infection of the lungs.  Acute respiratory distress syndrome or ARDS. This is a condition in which fluid build-up in the lungs prevents the lungs from filling with air and passing oxygen into the blood.  Acute respiratory failure. This is a condition in which there is not enough oxygen passing from the lungs to the body or when carbon dioxide is not passing from the lungs out of the body.  Sepsis or septic shock. This is a serious bodily reaction to an infection.  Blood clotting problems.  Secondary infections due to bacteria or fungus.  Organ failure. This is when your body's organs stop working. The virus that causes COVID-19 is contagious. This means that it can spread from person to person through droplets from coughs and sneezes (respiratory secretions). What are the causes? This illness is caused by a virus. You may catch the virus by:  Breathing in droplets from an infected person. Droplets can be spread by a person breathing, speaking, singing, coughing, or sneezing.  Touching something, like a table or a doorknob, that was exposed to the virus (contaminated) and then touching your mouth, nose, or eyes. What increases the risk? Risk for infection You are more likely to be infected with this virus if you:  Are within 6 feet (2 meters) of a person with COVID-19.  Provide care for or live with a person who is infected with COVID-19.  Spend time in crowded indoor spaces or live in shared housing. Risk for serious illness You are more likely to become seriously ill from the virus if you:   Are 50 years of age or older. The higher your age, the more you are at risk for serious illness.  Live in a nursing home or long-term care facility.  Have cancer.  Have a long-term (chronic) disease such as: ? Chronic lung disease, including chronic obstructive pulmonary disease or asthma. ? A long-term disease that lowers your body's ability to fight infection (immunocompromised). ? Heart disease, including heart failure, a condition in which the arteries that lead to the heart become narrow or blocked (coronary artery disease), a disease which makes the heart muscle thick, weak, or stiff (cardiomyopathy). ? Diabetes. ? Chronic kidney disease. ? Sickle cell disease, a condition in which red blood cells have an abnormal "sickle" shape. ? Liver disease.  Are obese. What are the signs or symptoms? Symptoms of this condition can range from mild to severe. Symptoms may appear any time from 2 to 14 days after being exposed to the virus. They include:  A fever or chills.  A cough.  Difficulty breathing.  Headaches, body aches, or muscle aches.  Runny or stuffy (congested) nose.  A sore throat.  New loss of taste or smell. Some people may also have stomach problems, such as nausea, vomiting, or diarrhea. Other people may not have any symptoms of COVID-19. How is this diagnosed? This condition may be diagnosed based on:  Your signs and symptoms, especially if: ? You live in an area with a COVID-19 outbreak. ? You recently traveled to or from an area where the virus is common. ? You   provide care for or live with a person who was diagnosed with COVID-19. ? You were exposed to a person who was diagnosed with COVID-19.  A physical exam.  Lab tests, which may include: ? Taking a sample of fluid from the back of your nose and throat (nasopharyngeal fluid), your nose, or your throat using a swab. ? A sample of mucus from your lungs (sputum). ? Blood tests.  Imaging tests, which  may include, X-rays, CT scan, or ultrasound. How is this treated? At present, there is no medicine to treat COVID-19. Medicines that treat other diseases are being used on a trial basis to see if they are effective against COVID-19. Your health care provider will talk with you about ways to treat your symptoms. For most people, the infection is mild and can be managed at home with rest, fluids, and over-the-counter medicines. Treatment for a serious infection usually takes places in a hospital intensive care unit (ICU). It may include one or more of the following treatments. These treatments are given until your symptoms improve.  Receiving fluids and medicines through an IV.  Supplemental oxygen. Extra oxygen is given through a tube in the nose, a face mask, or a hood.  Positioning you to lie on your stomach (prone position). This makes it easier for oxygen to get into the lungs.  Continuous positive airway pressure (CPAP) or bi-level positive airway pressure (BPAP) machine. This treatment uses mild air pressure to keep the airways open. A tube that is connected to a motor delivers oxygen to the body.  Ventilator. This treatment moves air into and out of the lungs by using a tube that is placed in your windpipe.  Tracheostomy. This is a procedure to create a hole in the neck so that a breathing tube can be inserted.  Extracorporeal membrane oxygenation (ECMO). This procedure gives the lungs a chance to recover by taking over the functions of the heart and lungs. It supplies oxygen to the body and removes carbon dioxide. Follow these instructions at home: Lifestyle  If you are sick, stay home except to get medical care. Your health care provider will tell you how long to stay home. Call your health care provider before you go for medical care.  Rest at home as told by your health care provider.  Do not use any products that contain nicotine or tobacco, such as cigarettes, e-cigarettes, and  chewing tobacco. If you need help quitting, ask your health care provider.  Return to your normal activities as told by your health care provider. Ask your health care provider what activities are safe for you. General instructions  Take over-the-counter and prescription medicines only as told by your health care provider.  Drink enough fluid to keep your urine pale yellow.  Keep all follow-up visits as told by your health care provider. This is important. How is this prevented?  There is no vaccine to help prevent COVID-19 infection. However, there are steps you can take to protect yourself and others from this virus. To protect yourself:   Do not travel to areas where COVID-19 is a risk. The areas where COVID-19 is reported change often. To identify high-risk areas and travel restrictions, check the CDC travel website: wwwnc.cdc.gov/travel/notices  If you live in, or must travel to, an area where COVID-19 is a risk, take precautions to avoid infection. ? Stay away from people who are sick. ? Wash your hands often with soap and water for 20 seconds. If soap and water   are not available, use an alcohol-based hand sanitizer. ? Avoid touching your mouth, face, eyes, or nose. ? Avoid going out in public, follow guidance from your state and local health authorities. ? If you must go out in public, wear a cloth face covering or face mask. Make sure your mask covers your nose and mouth. ? Avoid crowded indoor spaces. Stay at least 6 feet (2 meters) away from others. ? Disinfect objects and surfaces that are frequently touched every day. This may include:  Counters and tables.  Doorknobs and light switches.  Sinks and faucets.  Electronics, such as phones, remote controls, keyboards, computers, and tablets. To protect others: If you have symptoms of COVID-19, take steps to prevent the virus from spreading to others.  If you think you have a COVID-19 infection, contact your health care  provider right away. Tell your health care team that you think you may have a COVID-19 infection.  Stay home. Leave your house only to seek medical care. Do not use public transport.  Do not travel while you are sick.  Wash your hands often with soap and water for 20 seconds. If soap and water are not available, use alcohol-based hand sanitizer.  Stay away from other members of your household. Let healthy household members care for children and pets, if possible. If you have to care for children or pets, wash your hands often and wear a mask. If possible, stay in your own room, separate from others. Use a different bathroom.  Make sure that all people in your household wash their hands well and often.  Cough or sneeze into a tissue or your sleeve or elbow. Do not cough or sneeze into your hand or into the air.  Wear a cloth face covering or face mask. Make sure your mask covers your nose and mouth. Where to find more information  Centers for Disease Control and Prevention: www.cdc.gov/coronavirus/2019-ncov/index.html  World Health Organization: www.who.int/health-topics/coronavirus Contact a health care provider if:  You live in or have traveled to an area where COVID-19 is a risk and you have symptoms of the infection.  You have had contact with someone who has COVID-19 and you have symptoms of the infection. Get help right away if:  You have trouble breathing.  You have pain or pressure in your chest.  You have confusion.  You have bluish lips and fingernails.  You have difficulty waking from sleep.  You have symptoms that get worse. These symptoms may represent a serious problem that is an emergency. Do not wait to see if the symptoms will go away. Get medical help right away. Call your local emergency services (911 in the [place] Do not drive yourself to the hospital. Let the emergency medical personnel know if you think you have COVID-19. Summary  COVID-19 is a  respiratory infection that is caused by a virus. It is also known as coronavirus disease or novel coronavirus. It can cause serious infections, such as pneumonia, acute respiratory distress syndrome, acute respiratory failure, or sepsis.  The virus that causes COVID-19 is contagious. This means that it can spread from person to person through droplets from breathing, speaking, singing, coughing, or sneezing.  You are more likely to develop a serious illness if you are 50 years of age or older, have a weak immune system, live in a nursing home, or have chronic disease.  There is no medicine to treat COVID-19. Your health care provider will talk with you about ways to treat your symptoms.    Take steps to protect yourself and others from infection. Wash your hands often and disinfect objects and surfaces that are frequently touched every day. Stay away from people who are sick and wear a mask if you are sick. This information is not intended to replace advice given to you by your health care provider. Make sure you discuss any questions you have with your health care provider. Document Revised: 10/16/2019 Document Reviewed: 01/22/2019 Elsevier Patient Education  2020 [employer]  

## 2020-01-14 NOTE — Assessment & Plan Note (Signed)
Acute and feeling 75% better.  Is okay to return to work on Monday, off quarantine at that time and has note from health department.  Instructed him to take it easy on return to work and not push too hard.  Recommend continue daily Vit D and Zinc + get plenty of rest and hydration.  Return to office as scheduled in February or sooner if worsening.

## 2020-01-29 ENCOUNTER — Telehealth: Payer: Self-pay

## 2020-02-01 ENCOUNTER — Ambulatory Visit: Payer: Self-pay | Admitting: Family Medicine

## 2020-02-18 ENCOUNTER — Ambulatory Visit: Payer: Medicare Other | Admitting: Family Medicine

## 2020-02-24 ENCOUNTER — Other Ambulatory Visit: Payer: Self-pay

## 2020-02-24 ENCOUNTER — Encounter: Payer: Self-pay | Admitting: Nurse Practitioner

## 2020-02-24 ENCOUNTER — Ambulatory Visit (INDEPENDENT_AMBULATORY_CARE_PROVIDER_SITE_OTHER): Payer: Medicare Other | Admitting: Nurse Practitioner

## 2020-02-24 VITALS — BP 135/69 | HR 69 | Temp 98.3°F

## 2020-02-24 DIAGNOSIS — E1159 Type 2 diabetes mellitus with other circulatory complications: Secondary | ICD-10-CM | POA: Diagnosis not present

## 2020-02-24 DIAGNOSIS — J432 Centrilobular emphysema: Secondary | ICD-10-CM

## 2020-02-24 DIAGNOSIS — E538 Deficiency of other specified B group vitamins: Secondary | ICD-10-CM | POA: Diagnosis not present

## 2020-02-24 DIAGNOSIS — E1169 Type 2 diabetes mellitus with other specified complication: Secondary | ICD-10-CM | POA: Diagnosis not present

## 2020-02-24 DIAGNOSIS — E785 Hyperlipidemia, unspecified: Secondary | ICD-10-CM | POA: Diagnosis not present

## 2020-02-24 DIAGNOSIS — K640 First degree hemorrhoids: Secondary | ICD-10-CM

## 2020-02-24 DIAGNOSIS — I700 Atherosclerosis of aorta: Secondary | ICD-10-CM

## 2020-02-24 DIAGNOSIS — E1165 Type 2 diabetes mellitus with hyperglycemia: Secondary | ICD-10-CM

## 2020-02-24 DIAGNOSIS — K649 Unspecified hemorrhoids: Secondary | ICD-10-CM | POA: Insufficient documentation

## 2020-02-24 DIAGNOSIS — I10 Essential (primary) hypertension: Secondary | ICD-10-CM

## 2020-02-24 DIAGNOSIS — I2510 Atherosclerotic heart disease of native coronary artery without angina pectoris: Secondary | ICD-10-CM

## 2020-02-24 LAB — MICROALBUMIN, URINE WAIVED
Creatinine, Urine Waived: 100 mg/dL (ref 10–300)
Microalb, Ur Waived: 10 mg/L (ref 0–19)
Microalb/Creat Ratio: <30 mg/g (ref <30)

## 2020-02-24 LAB — BAYER DCA HB A1C WAIVED: HB A1C (BAYER DCA - WAIVED): 7.1 % — ABNORMAL HIGH (ref <7.0)

## 2020-02-24 MED ORDER — HYDROCORTISONE (PERIANAL) 2.5 % EX CREA: 1.0000 "application " | TOPICAL_CREAM | Freq: Two times a day (BID) | CUTANEOUS | 0 refills | Status: DC

## 2020-02-24 NOTE — Assessment & Plan Note (Signed)
Continue ASA and Lipitor daily for prevention.  Quit smoking 2 years ago.   

## 2020-02-24 NOTE — Assessment & Plan Note (Signed)
Chronic, stable with minimal use of Albuterol.  Continue to monitor and plan for spirometry at upcoming visit.  Send refills of Albuterol as needed.  Continue cessation of smoking.

## 2020-02-24 NOTE — Progress Notes (Signed)
BP 135/69 (BP Location: Left Arm, Patient Position: Sitting, Cuff Size: Normal)   Pulse 69   Temp 98.3 F (36.8 C) (Oral)   SpO2 93%    Subjective:    Patient ID: Brendan [name], male    DOB: 09-Aug-1951, 69 y.o.   MRN: [id number]  HPI: Brendan [name] is a 69 y.o. male  Chief Complaint  Patient presents with  . Diabetes  . Hypertension  . Hyperlipidemia  . COPD  . PVD  . Hemorrhoids    using Prep-H   DIABETES Last A1C 6.9% November 2020.  Continues on Metformin 1000 MG BID.   Hypoglycemic episodes:no Polydipsia/polyuria: no Visual disturbance: no Chest pain: no Paresthesias: no Glucose Monitoring: yes  Accucheck frequency: Daily  Fasting glucose: 116 yesterday --- 110 on average in morning  Post prandial: 150-160  Evening:  Before meals: Taking Insulin?: no  Long acting insulin:  Short acting insulin: Blood Pressure Monitoring: not checking Retinal Examination: Not up to Date Foot Exam: Up to Date Pneumovax: Up to Date Influenza: Up to Date Aspirin: yes   HYPERTENSION / HYPERLIPIDEMIA Continues on Metoprolol, Lasix, Hygroton, Amlodipine, and Lipitor. Followed by cardiology and last seen 03/04/2019 by NP Brion Aliment.  They added Potassium Chloride daily due to borderline low K+ and doubled diuretic therapy. Allergic to Lisinopril. Aortic atherosclerosis noted on recent lung CT screening. Satisfied with current treatment? yes Duration of hypertension: chronic BP monitoring frequency: a few days a week BP range: 110-125/70's range BP medication side effects: no Duration of hyperlipidemia: chronic Cholesterol medication side effects: no Cholesterol supplements: none Medication compliance: good compliance Aspirin: yes Recent stressors: no Recurrent headaches: no Visual changes: no Palpitations: no Dyspnea: no Chest pain: no Lower extremity edema: no Dizzy/lightheaded: no   COPD Has not smoked in 3 years since his bypass surgery.  Does not often use Albuterol. Was  Covid positive 12/30/2019, recovered well and no further symptoms. COPD status: stable Satisfied with current treatment?: yes Oxygen use: no Dyspnea frequency:  Cough frequency:  Rescue inhaler frequency:   Limitation of activity: no Productive cough:  Last Spirometry:  Pneumovax: Up to Date Influenza: Up to Date   HEMORRHOIDS States they have been bothering him for a month, has been using Preparation H and this helps them be less inflamed.  Has BM every day without straining. Duration: weeks Anal fullness: no Perianal itching/irritation: a little bit on occasion Perianal pain: no Bright red rectal bleeding: no Amount of blood: none Constipation: no Hard stools: no Chronic straining/valsava: no Status: stable Treatments attempted: hydrocortisone cream  Previous hemorrhoids: no  Colonoscopy: yes -- had in 2015 last  Relevant past medical, surgical, family and social history reviewed and updated as indicated. Interim medical history since our last visit reviewed. Allergies and medications reviewed and updated.  Review of Systems  Constitutional: Negative for activity change, diaphoresis, fatigue and fever.  Respiratory: Negative for cough, chest tightness, shortness of breath and wheezing.   Cardiovascular: Negative for chest pain, palpitations and leg swelling.  Gastrointestinal: Negative for abdominal distention, abdominal pain, constipation, diarrhea, nausea and vomiting.  Endocrine: Negative for cold intolerance, heat intolerance, polydipsia, polyphagia and polyuria.  Neurological: Negative for dizziness, syncope, weakness, light-headedness, numbness and headaches.  Psychiatric/Behavioral: Negative.     Per HPI unless specifically indicated above     Objective:    BP 135/69 (BP Location: Left Arm, Patient Position: Sitting, Cuff Size: Normal)   Pulse 69   Temp 98.3 F (36.8 C) (Oral)   SpO2 93%  Wt Readings from Last 3 Encounters:  08/19/19 204 lb (92.5 kg)    06/23/19 208 lb (94.3 kg)  05/12/19 206 lb (93.4 kg)    Physical Exam Vitals and nursing note reviewed.  Constitutional:      General: He is awake. He is not in acute distress.    Appearance: He is well-developed and overweight. He is not ill-appearing.  HENT:     Head: Normocephalic and atraumatic.     Right Ear: Hearing normal. No drainage.     Left Ear: Hearing normal. No drainage.  Eyes:     General: Lids are normal.        Right eye: No discharge.        Left eye: No discharge.     Conjunctiva/sclera: Conjunctivae normal.     Pupils: Pupils are equal, round, and reactive to light.  Neck:     Vascular: No carotid bruit.  Cardiovascular:     Rate and Rhythm: Normal rate and regular rhythm.     Heart sounds: Normal heart sounds, S1 normal and S2 normal. No murmur. No gallop.   Pulmonary:     Effort: Pulmonary effort is normal. No accessory muscle usage or respiratory distress.     Breath sounds: Normal breath sounds.  Abdominal:     General: Bowel sounds are normal.     Palpations: Abdomen is soft.     Tenderness: There is no abdominal tenderness.  Genitourinary:    Rectum: External hemorrhoid (at approx 3 o'clock position) present. No mass or tenderness.     Comments: No blood noted on glove with exam. Musculoskeletal:        General: Normal range of motion.     Cervical back: Normal range of motion and neck supple.     Right lower leg: No edema.     Left lower leg: No edema.  Skin:    General: Skin is warm and dry.  Neurological:     Mental Status: He is alert and oriented to person, place, and time.  Psychiatric:        Attention and Perception: Attention normal.        Mood and Affect: Mood normal.        Speech: Speech normal.        Behavior: Behavior normal. Behavior is cooperative.     Results for orders placed or performed in visit on 12/30/19  Novel Coronavirus, NAA ([facility])   Specimen: Nasopharyngeal(NP) swabs in vial transport medium    NASOPHARYNGE  TESTING  Result Value Ref Range   SARS-CoV-2, NAA Detected (A) Not Detected      Assessment & Plan:   Problem List Items Addressed This Visit      Cardiovascular and Mediastinum   Hypertension associated with diabetes (HCC)    Chronic, ongoing with BP close to goal in office and at goal on home readings.  Continue current medication regimen and adjust as needed.  Continue collaboration with cardiology.  Recommend he monitor BP at least a few times a week at home and document.  CMP this visit.      Relevant Orders   Comprehensive metabolic panel   Microalbumin, Urine Waived   Aortic atherosclerosis (HCC)    Continue ASA and Lipitor daily for prevention.  Quit smoking 2 years ago.        Hemorrhoids    Script for Anusol.  Recommend increase fiber and hydration in diet daily.  No straining.  May use Colace or Miralax as  needed for constipation.  Return to office for worsening or ongoing symptoms.        Respiratory   Centrilobular emphysema (HCC)    Chronic, stable with minimal use of Albuterol.  Continue to monitor and plan for spirometry at upcoming visit.  Send refills of Albuterol as needed.  Continue cessation of smoking.        Endocrine   Type 2 diabetes mellitus with hyperglycemia, without long-term current use of insulin (HCC) - Primary    Chronic, ongoing.  A1C 7.1%, slight increase from previous visit. Urine ALB 10 and A:C <30. Continue current medication regimen and checking BS at home daily.  Discussed addition of Jardiance or Farxiga, which would offer heart benefit, but he wishes to hold off at this time.  Return in 3 months for follow-up, if ongoing elevation A1C add on SGLT2.        Relevant Orders   Comprehensive metabolic panel   Bayer DCA Hb A1c Waived   Microalbumin, Urine Waived   Hyperlipidemia associated with type 2 diabetes mellitus (HCC)    Chronic, ongoing.  Continue current medication regimen and adjust as needed.  Lipid panel and CMP  today.      Relevant Orders   Comprehensive metabolic panel   Lipid Panel w/o Chol/HDL Ratio   Bayer DCA Hb A1c Waived    Other Visit Diagnoses    B12 deficiency       Reports history of low level, is on long term Metformin with neuropathy pain feet, check level today and initiate supplement as needed.   Relevant Orders   Vitamin B12       Follow up plan: Return in about 3 months (around 05/23/2020) for T2DM, HTN/HLD, COPD (spirometry).

## 2020-02-24 NOTE — Assessment & Plan Note (Signed)
Script for [employer].  Recommend increase fiber and hydration in diet daily.  No straining.  May use Colace or Miralax as needed for constipation.  Return to office for worsening or ongoing symptoms.

## 2020-02-24 NOTE — Assessment & Plan Note (Addendum)
Chronic, ongoing.  A1C 7.1%, slight increase from previous visit. Urine ALB 10 and A:C <30. Continue current medication regimen and checking BS at home daily.  Discussed addition of Jardiance or Farxiga, which would offer heart benefit, but he wishes to hold off at this time.  Return in 3 months for follow-up, if ongoing elevation A1C add on SGLT2.

## 2020-02-24 NOTE — Patient Instructions (Signed)
Carbohydrate Counting for Diabetes Mellitus, Adult  Carbohydrate counting is a method of keeping track of how many carbohydrates you eat. Eating carbohydrates naturally increases the amount of sugar (glucose) in the blood. Counting how many carbohydrates you eat helps keep your blood glucose within normal limits, which helps you manage your diabetes (diabetes mellitus). It is important to know how many carbohydrates you can safely have in each meal. This is different for every person. A diet and nutrition specialist (registered dietitian) can help you make a meal plan and calculate how many carbohydrates you should have at each meal and snack. Carbohydrates are found in the following foods:  Grains, such as breads and cereals.  Dried beans and soy products.  Starchy vegetables, such as potatoes, peas, and corn.  Fruit and fruit juices.  Milk and yogurt.  Sweets and snack foods, such as cake, cookies, candy, chips, and soft drinks. How do I count carbohydrates? There are two ways to count carbohydrates in food. You can use either of the methods or a combination of both. Reading "Nutrition Facts" on packaged food The "Nutrition Facts" list is included on the labels of almost all packaged foods and beverages in the [place] It includes:  The serving size.  Information about nutrients in each serving, including the grams (g) of carbohydrate per serving. To use the "Nutrition Facts":  Decide how many servings you will have.  Multiply the number of servings by the number of carbohydrates per serving.  The resulting number is the total amount of carbohydrates that you will be having. Learning standard serving sizes of other foods When you eat carbohydrate foods that are not packaged or do not include "Nutrition Facts" on the label, you need to measure the servings in order to count the amount of carbohydrates:  Measure the foods that you will eat with a food scale or measuring cup, if  needed.  Decide how many standard-size servings you will eat.  Multiply the number of servings by 15. Most carbohydrate-rich foods have about 15 g of carbohydrates per serving. ? For example, if you eat 8 oz (170 g) of strawberries, you will have eaten 2 servings and 30 g of carbohydrates (2 servings x 15 g = 30 g).  For foods that have more than one food mixed, such as soups and casseroles, you must count the carbohydrates in each food that is included. The following list contains standard serving sizes of common carbohydrate-rich foods. Each of these servings has about 15 g of carbohydrates:   hamburger bun or  English muffin.   oz (15 mL) syrup.   oz (14 g) jelly.  1 slice of bread.  1 six-inch tortilla.  3 oz (85 g) cooked rice or pasta.  4 oz (113 g) cooked dried beans.  4 oz (113 g) starchy vegetable, such as peas, corn, or potatoes.  4 oz (113 g) hot cereal.  4 oz (113 g) mashed potatoes or  of a large baked potato.  4 oz (113 g) canned or frozen fruit.  4 oz (120 mL) fruit juice.  4-6 crackers.  6 chicken nuggets.  6 oz (170 g) unsweetened dry cereal.  6 oz (170 g) plain fat-free yogurt or yogurt sweetened with artificial sweeteners.  8 oz (240 mL) milk.  8 oz (170 g) fresh fruit or one small piece of fruit.  24 oz (680 g) popped popcorn. Example of carbohydrate counting Sample meal  3 oz (85 g) chicken breast.  6 oz (170 g)   brown rice.  4 oz (113 g) corn.  8 oz (240 mL) milk.  8 oz (170 g) strawberries with sugar-free whipped topping. Carbohydrate calculation 1. Identify the foods that contain carbohydrates: ? Rice. ? Corn. ? Milk. ? Strawberries. 2. Calculate how many servings you have of each food: ? 2 servings rice. ? 1 serving corn. ? 1 serving milk. ? 1 serving strawberries. 3. Multiply each number of servings by 15 g: ? 2 servings rice x 15 g = 30 g. ? 1 serving corn x 15 g = 15 g. ? 1 serving milk x 15 g = 15 g. ? 1  serving strawberries x 15 g = 15 g. 4. Add together all of the amounts to find the total grams of carbohydrates eaten: ? 30 g + 15 g + 15 g + 15 g = 75 g of carbohydrates total. Summary  Carbohydrate counting is a method of keeping track of how many carbohydrates you eat.  Eating carbohydrates naturally increases the amount of sugar (glucose) in the blood.  Counting how many carbohydrates you eat helps keep your blood glucose within normal limits, which helps you manage your diabetes.  A diet and nutrition specialist (registered dietitian) can help you make a meal plan and calculate how many carbohydrates you should have at each meal and snack. This information is not intended to replace advice given to you by your health care provider. Make sure you discuss any questions you have with your health care provider. Document Revised: 07/11/2017 Document Reviewed: 05/30/2016 Elsevier Patient Education  2020 [employer]  

## 2020-02-24 NOTE — Assessment & Plan Note (Signed)
Chronic, ongoing.  Continue current medication regimen and adjust as needed.  Lipid panel and CMP today.    

## 2020-02-24 NOTE — Assessment & Plan Note (Signed)
Chronic, ongoing with BP close to goal in office and at goal on home readings.  Continue current medication regimen and adjust as needed.  Continue collaboration with cardiology.  Recommend he monitor BP at least a few times a week at home and document.  CMP this visit.

## 2020-02-25 ENCOUNTER — Telehealth: Payer: Self-pay

## 2020-02-25 ENCOUNTER — Ambulatory Visit (INDEPENDENT_AMBULATORY_CARE_PROVIDER_SITE_OTHER): Payer: Medicare Other | Admitting: Family Medicine

## 2020-02-25 ENCOUNTER — Encounter: Payer: Self-pay | Admitting: Family Medicine

## 2020-02-25 ENCOUNTER — Other Ambulatory Visit: Payer: Self-pay

## 2020-02-25 VITALS — BP 151/99 | HR 102 | Temp 97.9°F

## 2020-02-25 DIAGNOSIS — G63 Polyneuropathy in diseases classified elsewhere: Secondary | ICD-10-CM

## 2020-02-25 DIAGNOSIS — I2510 Atherosclerotic heart disease of native coronary artery without angina pectoris: Secondary | ICD-10-CM

## 2020-02-25 DIAGNOSIS — E1165 Type 2 diabetes mellitus with hyperglycemia: Secondary | ICD-10-CM | POA: Diagnosis not present

## 2020-02-25 DIAGNOSIS — M2041 Other hammer toe(s) (acquired), right foot: Secondary | ICD-10-CM

## 2020-02-25 DIAGNOSIS — G629 Polyneuropathy, unspecified: Secondary | ICD-10-CM | POA: Insufficient documentation

## 2020-02-25 LAB — COMPREHENSIVE METABOLIC PANEL
ALT: 25 IU/L (ref 0–44)
AST: 17 IU/L (ref 0–40)
Albumin/Globulin Ratio: 2.2 (ref 1.2–2.2)
Albumin: 4.8 g/dL (ref 3.8–4.8)
Alkaline Phosphatase: 54 IU/L (ref 39–117)
BUN/Creatinine Ratio: 16 (ref 10–24)
BUN: 19 mg/dL (ref 8–27)
Bilirubin Total: 0.4 mg/dL (ref 0.0–1.2)
CO2: 22 mmol/L (ref 20–29)
Calcium: 9.8 mg/dL (ref 8.6–10.2)
Chloride: 103 mmol/L (ref 96–106)
Creatinine, Ser: 1.21 mg/dL (ref 0.76–1.27)
GFR calc Af Amer: 71 mL/min/{1.73_m2} (ref >59)
GFR calc non Af Amer: 61 mL/min/{1.73_m2} (ref >59)
Globulin, Total: 2.2 g/dL (ref 1.5–4.5)
Glucose: 108 mg/dL — ABNORMAL HIGH (ref 65–99)
Potassium: 3.5 mmol/L (ref 3.5–5.2)
Sodium: 144 mmol/L (ref 134–144)
Total Protein: 7 g/dL (ref 6.0–8.5)

## 2020-02-25 LAB — LIPID PANEL W/O CHOL/HDL RATIO
Cholesterol, Total: 121 mg/dL (ref 100–199)
HDL: 35 mg/dL — ABNORMAL LOW (ref >39)
LDL Chol Calc (NIH): 66 mg/dL (ref 0–99)
Triglycerides: 108 mg/dL (ref 0–149)
VLDL Cholesterol Cal: 20 mg/dL (ref 5–40)

## 2020-02-25 LAB — VITAMIN B12: Vitamin B-12: 359 pg/mL (ref 232–1245)

## 2020-02-25 NOTE — Assessment & Plan Note (Signed)
Needs diabetic shoes. Rx written today.

## 2020-02-25 NOTE — Assessment & Plan Note (Signed)
Needs diabetic shoes. Rx written. Call with any concerns.

## 2020-02-25 NOTE — Progress Notes (Signed)
BP (!) 151/99 (BP Location: Left Arm, Cuff Size: Normal)   Pulse (!) 102   Temp 97.9 F (36.6 C) (Oral)   SpO2 92%    Subjective:    Patient ID: Brendan [name], male    DOB: 1951/04/05, 69 y.o.   MRN: [id number]  HPI: Brendan [name] is a 69 y.o. male  Chief Complaint  Patient presents with  . Diabetes    Diabetic shoes  . Hypertension    Recheck blood pressure with pt personal BP machine   Seen yesterday for DM follow up. Following closely with PCP. Needs diabetic shoes with form filled out. Occasional numbness in his feet. Occasional pain in his feet. He is otherwise feeling well with no other concerns or complaints at this time.   Relevant past medical, surgical, family and social history reviewed and updated as indicated. Interim medical history since our last visit reviewed. Allergies and medications reviewed and updated.  Review of Systems  Constitutional: Negative.   Respiratory: Negative.   Cardiovascular: Negative.   Musculoskeletal: Negative.   Psychiatric/Behavioral: Negative.     Per HPI unless specifically indicated above     Objective:    BP (!) 151/99 (BP Location: Left Arm, Cuff Size: Normal)   Pulse (!) 102   Temp 97.9 F (36.6 C) (Oral)   SpO2 92%   Wt Readings from Last 3 Encounters:  08/19/19 204 lb (92.5 kg)  06/23/19 208 lb (94.3 kg)  05/12/19 206 lb (93.4 kg)    Physical Exam Vitals and nursing note reviewed.  Constitutional:      General: He is not in acute distress.    Appearance: Normal appearance. He is not ill-appearing, toxic-appearing or diaphoretic.  HENT:     Head: Normocephalic and atraumatic.     Right Ear: External ear normal.     Left Ear: External ear normal.     Nose: Nose normal.     Mouth/Throat:     Mouth: Mucous membranes are moist.     Pharynx: Oropharynx is clear.  Eyes:     General: No scleral icterus.       Right eye: No discharge.        Left eye: No discharge.     Extraocular Movements: Extraocular movements  intact.     Conjunctiva/sclera: Conjunctivae normal.     Pupils: Pupils are equal, round, and reactive to light.  Cardiovascular:     Rate and Rhythm: Normal rate and regular rhythm.     Pulses: Normal pulses.     Heart sounds: Normal heart sounds. No murmur. No friction rub. No gallop.   Pulmonary:     Effort: Pulmonary effort is normal. No respiratory distress.     Breath sounds: Normal breath sounds. No stridor. No wheezing, rhonchi or rales.  Chest:     Chest wall: No tenderness.  Musculoskeletal:        General: Normal range of motion.     Cervical back: Normal range of motion and neck supple.  Skin:    General: Skin is warm and dry.     Capillary Refill: Capillary refill takes less than 2 seconds.     Coloration: Skin is not jaundiced or pale.     Findings: No bruising, erythema, lesion or rash.     Comments: Ichthyosis on bilateral legs. Hammer toes R foot (all toes)  Neurological:     General: No focal deficit present.     Mental Status: He is alert and oriented to person,  place, and time. Mental status is at baseline.  Psychiatric:        Mood and Affect: Mood normal.        Behavior: Behavior normal.        Thought Content: Thought content normal.        Judgment: Judgment normal.     Results for orders placed or performed in visit on 02/24/20  Comprehensive metabolic panel  Result Value Ref Range   Glucose 108 (H) 65 - 99 mg/dL   BUN 19 8 - 27 mg/dL   Creatinine, Ser 6.60 0.76 - 1.27 mg/dL   GFR calc non Af Amer 61 >59 mL/min/1.73   GFR calc Af Amer 71 >59 mL/min/1.73   BUN/Creatinine Ratio 16 10 - 24   Sodium 144 134 - 144 mmol/L   Potassium 3.5 3.5 - 5.2 mmol/L   Chloride 103 96 - 106 mmol/L   CO2 22 20 - 29 mmol/L   Calcium 9.8 8.6 - 10.2 mg/dL   Total Protein 7.0 6.0 - 8.5 g/dL   Albumin 4.8 3.8 - 4.8 g/dL   Globulin, Total 2.2 1.5 - 4.5 g/dL   Albumin/Globulin Ratio 2.2 1.2 - 2.2   Bilirubin Total 0.4 0.0 - 1.2 mg/dL   Alkaline Phosphatase 54 39 -  117 IU/L   AST 17 0 - 40 IU/L   ALT 25 0 - 44 IU/L  Lipid Panel w/o Chol/HDL Ratio  Result Value Ref Range   Cholesterol, Total 121 100 - 199 mg/dL   Triglycerides 630 0 - 149 mg/dL   HDL 35 (L) >16 mg/dL   VLDL Cholesterol Cal 20 5 - 40 mg/dL   LDL Chol Calc (NIH) 66 0 - 99 mg/dL  Bayer DCA Hb W1U Waived  Result Value Ref Range   HB A1C (BAYER DCA - WAIVED) 7.1 (H) <7.0 %  Microalbumin, Urine Waived  Result Value Ref Range   Microalb, Ur Waived 10 0 - 19 mg/L   Creatinine, Urine Waived 100 10 - 300 mg/dL   Microalb/Creat Ratio <30 <30 mg/g  Vitamin B12  Result Value Ref Range   Vitamin B-12 359 232 - 1,245 pg/mL      Assessment & Plan:   Problem List Items Addressed This Visit      Endocrine   Type 2 diabetes mellitus with hyperglycemia, without long-term current use of insulin (HCC)    Needs diabetic shoes. Rx written. Call with any concerns.         Nervous and Auditory   Peripheral neuropathy    Needs diabetic shoes. Rx written today.       Other Visit Diagnoses    Hammer toe of right foot    -  Primary   Needs diabetic shoes. Rx written today.       Follow up plan: Return if symptoms worsen or fail to improve.

## 2020-02-25 NOTE — Telephone Encounter (Signed)
Advised Pt letter was sent out with lab results.   Copied from CRM [id number]. Topic: General - Other >> Feb 25, 2020  3:44 PM Mcneil, Ja-Kwan wrote: Reason for CRM: Pt stated he had an appt but when he returned home he had a missed call from the office. Pt requests call back

## 2020-02-25 NOTE — Progress Notes (Signed)
Please let Joaquim know via phone or letter: Labs have returned.  Kidney and liver function remain normal.  Cholesterol levels are at goal.  B12 remains on low side of normal, I do recommend you take Vitamin B12 1000 MCG daily, which you can obtain in the vitamin section of many stores, this is good for nerve pain in feet with diabetes and chronic Metformin use.  If any questions let me know.  Have a great day.

## 2020-03-01 ENCOUNTER — Encounter: Payer: Self-pay | Admitting: Physician Assistant

## 2020-03-01 ENCOUNTER — Ambulatory Visit (INDEPENDENT_AMBULATORY_CARE_PROVIDER_SITE_OTHER): Payer: Medicare Other | Admitting: Physician Assistant

## 2020-03-01 ENCOUNTER — Other Ambulatory Visit: Payer: Self-pay

## 2020-03-01 VITALS — BP 114/72 | HR 73 | Ht 75.0 in | Wt 197.0 lb

## 2020-03-01 DIAGNOSIS — I10 Essential (primary) hypertension: Secondary | ICD-10-CM | POA: Diagnosis not present

## 2020-03-01 DIAGNOSIS — R9431 Abnormal electrocardiogram [ECG] [EKG]: Secondary | ICD-10-CM | POA: Diagnosis not present

## 2020-03-01 DIAGNOSIS — E119 Type 2 diabetes mellitus without complications: Secondary | ICD-10-CM | POA: Diagnosis not present

## 2020-03-01 DIAGNOSIS — I2510 Atherosclerotic heart disease of native coronary artery without angina pectoris: Secondary | ICD-10-CM

## 2020-03-01 DIAGNOSIS — E785 Hyperlipidemia, unspecified: Secondary | ICD-10-CM

## 2020-03-01 NOTE — Progress Notes (Signed)
Cardiology Office Note    Date:  03/01/2020   ID:  Brendan [name], DOB Jun 05, 1951, MRN [id number]  PCP:  [name], NP  Cardiologist:  Nelva Bush, MD  Electrophysiologist:  None   Chief Complaint: Follow-up  History of Present Illness:   Brendan [name] is a 69 y.o. male with history of CAD status post four-vessel CABG in 06/2017 with LIMA to LAD, SVG to D2, SVG to OM, SVG to PDA, postoperative A. fib, DM2, HTN, HLD, COPD secondary to tobacco use, and recent COVID-19 infection in late 12/2019 who presents for follow-up of his CAD.  He underwent diagnostic LHC in 05/2017 for exertional angina which showed three-vessel CAD.  This was followed by four-vessel CABG on 07/02/2017 with postoperative course being complicated by pneumothorax requiring chest tube placement along with postoperative A. fib treated with a short course of amiodarone.  He has not required ischemic evaluation since.  There has been no documented evidence of recurrence of A. fib.  He was last seen in the office in 03/2019 and was doing quite well, continuing to work.  He did note some right lower extremity swelling ever since his bypass surgery that was stable.  He was diagnosed with COVID-19 in late 12/2019 and did not require hospital admission.  He comes in doing well today.  He has not noted any chest pain since he was last seen.  He does note an occasional episode of shortness of breath which predates his Covid 19 infection and is randomly occurring.  No lower extremity swelling, abdominal distention, orthopnea, PND, early satiety.  No falls, hematochezia, or melena.  No palpitations, dizziness, presyncope, syncope.  Blood pressure remains well controlled.  He is tolerating all cardiac medications without issues.  He has received his first COVID-19 vaccine and is scheduled for the second later this month.   Labs independently reviewed: 02/2020 - TC 121, TG 108, HDL 35, LDL 66, BUN 19, serum creatinine 1.21, potassium  3.5, albumin 4.8, AST/ALT normal, A1c 7.1 10/2018 - Hgb 16.3, PLT 219  Past Medical History:  Diagnosis Date  . CAD (coronary artery disease)    a. 05/2016 [facility] MV: EF 58%, no ischemia/infarct; b. 05/2017 Cath: LM 20-30, LAD 50p, 41m, D1 50p, D2 80ost, LCX 90/70ost, RCA 80/90p/m, RPL1 50p, EF 50-55%; c. 06/2017 CABG x 4 (LIMA->LAD, VG->D2,VG->OM, VG->PDA).  . Diabetes mellitus without complication ([place])   . Diastolic dysfunction    a. 05/2016 Echo: EF 55%, Gr1 DD, mild TR/MR.  [name] Hyperlipidemia   . Hypertension   . Hypokalemia   . Personal history of tobacco use, presenting hazards to health 05/03/2016  . Post-op Afib    a. 06/2017 following CABG->short course of amio.    Past Surgical History:  Procedure Laterality Date  . BACK SURGERY    . CORONARY ARTERY BYPASS GRAFT N/A 07/01/2017   Procedure: CORONARY ARTERY BYPASS GRAFTING (CABG) x4, using left mammary artery and right greater saphenous vein harvested endoscopically;  Surgeon: Ivin Poot, MD;  Location: [facility];  Service: Open Heart Surgery;  Laterality: N/A;  . KNEE SURGERY    . LEFT HEART CATH AND CORONARY ANGIOGRAPHY N/A 06/04/2017   Procedure: Left Heart Cath and Coronary Angiography;  Surgeon: Nelva Bush, MD;  Location: [facility];  Service: Cardiovascular;  Laterality: N/A;  . TEE WITHOUT CARDIOVERSION N/A 07/01/2017   Procedure: TRANSESOPHAGEAL ECHOCARDIOGRAM (TEE);  Surgeon: Prescott Gum, Collier Salina, MD;  Location: [facility];  Service: Open Heart Surgery;  Laterality: N/A;  Current Medications: Current Meds  Medication Sig  . acetaminophen (TYLENOL) 500 MG tablet Take 1 tablet (500 mg total) by mouth every 6 (six) hours as needed for mild pain or headache.  . albuterol (PROVENTIL HFA;VENTOLIN HFA) 108 (90 Base) MCG/ACT inhaler Inhale 2 puffs into the lungs every 6 (six) hours as needed for wheezing or shortness of breath.  [name] amLODipine (NORVASC) 5 MG tablet Take 1 tablet (5 mg total) by mouth daily.  [name] aspirin EC 81 MG  tablet Take 1 tablet (81 mg total) by mouth daily.  [name] atorvastatin (LIPITOR) 20 MG tablet Take 1 tablet (20 mg total) by mouth daily.  [name] azelastine (ASTELIN) 0.1 % nasal spray Place 2 sprays into both nostrils at bedtime.   . chlorthalidone (HYGROTON) 25 MG tablet Take 1 tablet (25 mg total) by mouth daily.  . furosemide (LASIX) 20 MG tablet Take 1 tablet (20 mg total) by mouth daily.  . hydrocortisone (ANUSOL-HC) 2.5 % rectal cream Place 1 application rectally 2 (two) times daily.  [name] loratadine (CLARITIN) 10 MG tablet Take 1 tablet (10 mg total) by mouth daily.  . metFORMIN (GLUCOPHAGE) 1000 MG tablet Take 1 tablet (1,000 mg total) by mouth 2 (two) times daily with a meal.  . metoprolol tartrate (LOPRESSOR) 25 MG tablet Take 0.5 tablets (12.5 mg total) by mouth 2 (two) times daily.  . montelukast (SINGULAIR) 10 MG tablet Take 1 tablet (10 mg total) by mouth at bedtime.  . Potassium Chloride ER 20 MEQ TBCR Take 2 tablets (40 meq) by mouth once daily  . sildenafil (REVATIO) 20 MG tablet TAKE 1 TO 5 TABLETS AS NEEDED.  [name] tamsulosin (FLOMAX) 0.4 MG CAPS capsule Take 1 capsule (0.4 mg total) by mouth daily.  [name] tiZANidine (ZANAFLEX) 4 MG tablet Take 1 tablet (4 mg total) by mouth every 6 (six) hours as needed for muscle spasms.    Allergies:   Lisinopril and No known allergies   Social History   Socioeconomic History  . Marital status: Widowed    Spouse name: Not on file  . Number of children: Not on file  . Years of education: Not on file  . Highest education level: 10th grade  Occupational History  . Occupation: works part time as Engineer, maintenance  . Smoking status: Former Smoker    Packs/day: 1.00    Years: 60.00    Pack years: 60.00    Types: Cigarettes    Quit date: 06/29/2017    Years since quitting: 2.6  . Smokeless tobacco: Never Used  Substance and Sexual Activity  . Alcohol use: No    Comment: previously drank heavily but quit ~ 21 yrs ago.  . Drug use: No  . Sexual  activity: Yes  Other Topics Concern  . Not on file  Social History Narrative   Lives in [place] by himself.  Works as Copy.  Does not routinely exercise.   Social Determinants of Health   Financial Resource Strain:   . Difficulty of Paying Living Expenses: Not on file  Food Insecurity:   . Worried About Programme researcher, broadcasting/film/video in the Last Year: Not on file  . Ran Out of Food in the Last Year: Not on file  Transportation Needs:   . Lack of Transportation (Medical): Not on file  . Lack of Transportation (Non-Medical): Not on file  Physical Activity:   . Days of Exercise per Week: Not on file  . Minutes of Exercise per Session: Not  on file  Stress:   . Feeling of Stress : Not on file  Social Connections:   . Frequency of Communication with Friends and Family: Not on file  . Frequency of Social Gatherings with Friends and Family: Not on file  . Attends Religious Services: Not on file  . Active Member of Clubs or Organizations: Not on file  . Attends Banker Meetings: Not on file  . Marital Status: Not on file     Family History:  The patient's family history includes Diabetes in his mother; Heart disease (age of onset: 65) in his father; Hypertension in his mother.  ROS:   Review of Systems  Constitutional: Positive for malaise/fatigue. Negative for chills, diaphoresis, fever and weight loss.  HENT: Negative for congestion.   Eyes: Negative for discharge and redness.  Respiratory: Positive for shortness of breath. Negative for cough, sputum production and wheezing.   Cardiovascular: Negative for chest pain, palpitations, orthopnea, claudication, leg swelling and PND.  Gastrointestinal: Negative for abdominal pain, blood in stool, heartburn, melena, nausea and vomiting.  Musculoskeletal: Negative for falls and myalgias.  Skin: Negative for rash.  Neurological: Negative for dizziness, tingling, tremors, sensory change, speech change, focal weakness, loss of  consciousness and weakness.  Endo/Heme/Allergies: Does not bruise/bleed easily.  Psychiatric/Behavioral: Negative for substance abuse. The patient is not nervous/anxious.   All other systems reviewed and are negative.    EKGs/Labs/Other Studies Reviewed:    Studies reviewed were summarized above. The additional studies were reviewed today:  LHC 05/2017: Conclusions: 1. Severe, three vessel coronary artery disease with diffuse calcification, as detailed below. 2. Basal and mid inferior hypokinesis with otherwise preserved left ventricular contraction (LVEF 50-55%). 3. Normal left ventricular filling pressure.  Recommendations: 1. Outpatient cardiac surgery evaluation given severe three-vessel, diabetes mellitus, and continued exertional angina despite aggressive medical therapy. 2. Continue secondary prevention. __________  TEE 06/2017:  Left ventricle: Normal cavity size, wall thickness, left ventricular  diastolic function and left atrial pressure. LV systolic function is low  normal with an EF of 50-55%. No thrombus present. No mass present.   Mitral valve: Trace regurgitation.   Right ventricle: Normal cavity size, wall thickness and ejection  fraction.   Tricuspid valve: Trace regurgitation.  EKG:  EKG is ordered today.  The EKG ordered today demonstrates NSR, 73 bpm, occasional PACs, anterior T wave inversion which is new when compared to prior study  Recent Labs: 03/11/2019: Magnesium 1.8 02/24/2020: ALT 25; BUN 19; Creatinine, Ser 1.21; Potassium 3.5; Sodium 144  Recent Lipid Panel    Component Value Date/Time   CHOL 121 02/24/2020 1334   CHOL 111 02/29/2016 1558   TRIG 108 02/24/2020 1334   TRIG 73 02/29/2016 1558   HDL 35 (L) 02/24/2020 1334   CHOLHDL 3.5 08/21/2018 1453   VLDL 15 02/29/2016 1558   LDLCALC 66 02/24/2020 1334    PHYSICAL EXAM:    VS:  BP 114/72 (BP Location: Left Arm, Patient Position: Sitting, Cuff Size: Normal)   Pulse 73   Ht 6\' 3"   (1.905 m)   Wt 197 lb (89.4 kg)   BMI 24.62 kg/m   BMI: Body mass index is 24.62 kg/m.  Physical Exam  Constitutional: He is oriented to person, place, and time. He appears well-developed and well-nourished.  HENT:  Head: Normocephalic and atraumatic.  Eyes: Right eye exhibits no discharge. Left eye exhibits no discharge.  Neck: No JVD present.  Cardiovascular: Normal rate, regular rhythm, S1 normal, S2 normal and  normal heart sounds. Exam reveals no distant heart sounds, no friction rub, no midsystolic click and no opening snap.  No murmur heard. Pulses:      Posterior tibial pulses are 2+ on the right side and 2+ on the left side.  Pulmonary/Chest: Effort normal and breath sounds normal. No respiratory distress. He has no decreased breath sounds. He has no wheezes. He has no rales. He exhibits no tenderness.  Abdominal: Soft. He exhibits no distension. There is no abdominal tenderness.  Musculoskeletal:        General: No edema.     Cervical back: Normal range of motion.     Comments: Chronic woody hyperpigmentation appearance of the bilateral lower extremities  Neurological: He is alert and oriented to person, place, and time.  Skin: Skin is warm and dry. No cyanosis. Nails show no clubbing.  Psychiatric: He has a normal mood and affect. His speech is normal and behavior is normal. Judgment and thought content normal.    Wt Readings from Last 3 Encounters:  03/01/20 197 lb (89.4 kg)  08/19/19 204 lb (92.5 kg)  06/23/19 208 lb (94.3 kg)     ASSESSMENT & PLAN:   1. CAD status post four-vessel CABG/abnormal EKG: He is doing well without any angina.  He does note chronic shortness of breath that is randomly occurring and has new anterior T wave inversion noted on his twelve-lead EKG today.  Given this, we will schedule an echo.  He denies any functional status limitations.  Continue secondary prevention with aspirin, Lipitor, and metoprolol.  2. HTN: Blood pressure is well  controlled today.  Continue current regimen including amlodipine, metoprolol, and diuretic.  He has been managed on both chlorthalidone and Lasix and appears to be tolerating these from a renal function perspective as well as potassium on most recent check.  3. HLD: LDL of 66 from 02/2020 with normal LFTs at that time.  Continue atorvastatin 20 mg daily.  4. DM2: Remains on Metformin.  No longer on ACE inhibitor secondary to fatigue and ED.  Followed by PCP.  Disposition: F/u with Dr. Okey Dupre or an APP in 6 months.   Medication Adjustments/Labs and Tests Ordered: Current medicines are reviewed at length with the patient today.  Concerns regarding medicines are outlined above. Medication changes, Labs and Tests ordered today are summarized above and listed in the Patient Instructions accessible in Encounters.   Signed, Eula Listen, PA-C 03/01/2020 2:25 PM     [facility] [place] [address] [place], [place] [address] [phone]

## 2020-03-01 NOTE — Patient Instructions (Signed)
Medication Instructions:  Your physician recommends that you continue on your current medications as directed. Please refer to the Current Medication list given to you today.  *If you need a refill on your cardiac medications before your next appointment, please call your pharmacy*   Lab Work: None If you have labs (blood work) drawn today and your tests are completely normal, you will receive your results only by: [name] MyChart Message (if you have MyChart) OR . A paper copy in the mail If you have any lab test that is abnormal or we need to change your treatment, we will call you to review the results.   Testing/Procedures: Your physician has requested that you have an echocardiogram. Echocardiography is a painless test that uses sound waves to create images of your heart. It provides your doctor with information about the size and shape of your heart and how well your heart's chambers and valves are working. This procedure takes approximately one hour. There are no restrictions for this procedure.     Follow-Up: At [facility], you and your health needs are our priority.  As part of our continuing mission to provide you with exceptional heart care, we have created designated Provider Care Teams.  These Care Teams include your primary Cardiologist (physician) and Advanced Practice Providers (APPs -  Physician Assistants and Nurse Practitioners) who all work together to provide you with the care you need, when you need it.  We recommend signing up for the patient portal called "MyChart".  Sign up information is provided on this After Visit Summary.  MyChart is used to connect with patients for Virtual Visits (Telemedicine).  Patients are able to view lab/test results, encounter notes, upcoming appointments, etc.  Non-urgent messages can be sent to your provider as well.   To learn more about what you can do with MyChart, go to [email or web address].    Your next appointment:   6  month(s)  The format for your next appointment:   In Person  Provider:     You may see Yvonne Kendall, MD or  Eula Listen    Other Instructions

## 2020-03-09 ENCOUNTER — Telehealth: Payer: Self-pay | Admitting: Internal Medicine

## 2020-03-09 ENCOUNTER — Other Ambulatory Visit: Payer: Self-pay

## 2020-03-09 MED ORDER — POTASSIUM CHLORIDE ER 20 MEQ PO TBCR: EXTENDED_RELEASE_TABLET | ORAL | 1 refills | Status: DC

## 2020-03-09 NOTE — Telephone Encounter (Signed)
*  STAT* If patient is at the pharmacy, call can be transferred to refill team.   1. Which medications need to be refilled? (please list name of each medication and dose if known) potassium chloride 20 meg bid  2. Which pharmacy/location (including street and city if local pharmacy) is medication to be sent to? [facility] in [place]  3. Do they need a 30 day or 90 day supply? 90

## 2020-03-09 NOTE — Telephone Encounter (Signed)
Potassium Chloride ER 20 MEQ TBCR 180 tablet 1 03/09/2020    Sig: Take 2 tablets (40 meq) by mouth once daily   Sent to pharmacy as: Potassium Chloride ER 20 MEQ Tab CR   E-Prescribing Status: Receipt confirmed by pharmacy (03/09/2020 11:29 AM EST)   Pharmacy  [facility] - [place], [place] - [address]

## 2020-03-15 ENCOUNTER — Ambulatory Visit (INDEPENDENT_AMBULATORY_CARE_PROVIDER_SITE_OTHER): Payer: Medicare Other | Admitting: Pharmacist

## 2020-03-15 DIAGNOSIS — I10 Essential (primary) hypertension: Secondary | ICD-10-CM

## 2020-03-15 DIAGNOSIS — E785 Hyperlipidemia, unspecified: Secondary | ICD-10-CM | POA: Diagnosis not present

## 2020-03-15 DIAGNOSIS — E1169 Type 2 diabetes mellitus with other specified complication: Secondary | ICD-10-CM | POA: Diagnosis not present

## 2020-03-15 DIAGNOSIS — E1165 Type 2 diabetes mellitus with hyperglycemia: Secondary | ICD-10-CM

## 2020-03-15 DIAGNOSIS — E1159 Type 2 diabetes mellitus with other circulatory complications: Secondary | ICD-10-CM | POA: Diagnosis not present

## 2020-03-15 NOTE — Patient Instructions (Signed)
Visit Information  Goals Addressed            This Visit's Progress     Patient Stated   . PharmD "I want to control my diabetes" (pt-stated)       CARE PLAN ENTRY (see longtitudinal plan of care for additional care plan information)  Current Barriers:  . Diabetes: uncontrolled; most recent A1c 7.1%  o Reports recovering well from COVID. Denies lingering cough or SOB. Does note that cardiology has scheduled an ECHO  o Wonders about getting diabetic shoes. Notes he saw Dr. Laural Benes for this last month, but hasn't heard anything from anyone. . Current antihyperglycemic regimen: metformin 1000 mg BID . Current meal patterns:  o Drinks: trying to drink more water; diet soda o Breakfast: couple of eggs, bologna, sometimes precooked bacon o Midmorning snack: chicken biscuit (4 in pack from grocery store) o Lunch: diet soda; banana, pack of potato chips o Supper: sometimes potato salad, sometimes pinto beans; sometimes fried chicken . Current blood glucose readings:  o Reports fastings in 150s . Cardiovascular risk reduction (follows w Dr. Okey Dupre) o Current hypertensive regimen: amlodipine 5 mg daily, chlorthalidone 25 mg daily, furosemide 20 mg daily, metoprolol tartrate 12.5 mg BID; reports he did receive BP machined mailed to him; home BP readings 110-120s/60-70s.  - K remains low. Continues potassium 40 mEq daily per cardiology o Current hyperlipidemia regimen: atorvastatin 20 mg daily; LDL at goal <70  Pharmacist Clinical Goal(s):  [name] Over the next 90 days, patient with work with PharmD and primary care provider to address optimized self-management of diabetes  Interventions: . Comprehensive medication review, medication list updated in electronic medical record.  . Reviewed goal A1c, goal fasting, goal post prandial glucose . Reviewed previous appt where need for diabetic shoes was noted. Will collaborate w/ PCP about next steps - unsure where order for shoes was sent by Dr.  Laural Benes . Praised for regular BP checks and documentation of control.  . Extensive dietary discussion. Discussed reduced carbohydrates, particularly with snacks like bananas and potato chips. Reviewed incorporating protein into snacks, like cheese, meats, protein bars, protein shakes. He verbalized understanding.  Patient Self Care Activities:  . Patient will check blood glucose daily , document, and provide at future appointments . Patient will take medications as prescribed . Patient will contact provider with any episodes of hypoglycemia . Patient will report any questions or concerns to provider   Please see past updates related to this goal by clicking on the "Past Updates" button in the selected goal         Patient verbalizes understanding of instructions provided today.   Plan:  - Scheduled f/u call 05/03/20  [name], PharmD, [facility] Clinical Pharmacist [facility] [phone]

## 2020-03-15 NOTE — Chronic Care Management (AMB) (Signed)
Chronic Care Management   Follow Up Note   03/15/2020 Name: Brendan [name] MRN: [id number] DOB: December 03, 1951  Referred by: Marjie Skiff, NP Reason for referral : Chronic Care Management (Medication Management)   Brendan [name] is a 69 y.o. year old male who is a primary care patient of [name], Dorie Rank, NP. The CCM team was consulted for assistance with chronic disease management and care coordination needs.    Contacted patient for medication management review.  Review of patient status, including review of consultants reports, relevant laboratory and other test results, and collaboration with appropriate care team members and the patient's provider was performed as part of comprehensive patient evaluation and provision of chronic care management services.    SDOH (Social Determinants of Health) assessments performed: Yes See Care Plan activities for detailed interventions related to [facility])     Outpatient Encounter Medications as of 03/15/2020  Medication Sig Note  . amLODipine (NORVASC) 5 MG tablet Take 1 tablet (5 mg total) by mouth daily.   [name] aspirin EC 81 MG tablet Take 1 tablet (81 mg total) by mouth daily.   [name] atorvastatin (LIPITOR) 20 MG tablet Take 1 tablet (20 mg total) by mouth daily.   . chlorthalidone (HYGROTON) 25 MG tablet Take 1 tablet (25 mg total) by mouth daily.   . furosemide (LASIX) 20 MG tablet Take 1 tablet (20 mg total) by mouth daily.   [name] loratadine (CLARITIN) 10 MG tablet Take 1 tablet (10 mg total) by mouth daily.   . metFORMIN (GLUCOPHAGE) 1000 MG tablet Take 1 tablet (1,000 mg total) by mouth 2 (two) times daily with a meal.   . metoprolol tartrate (LOPRESSOR) 25 MG tablet Take 0.5 tablets (12.5 mg total) by mouth 2 (two) times daily.   . montelukast (SINGULAIR) 10 MG tablet Take 1 tablet (10 mg total) by mouth at bedtime.   . Potassium Chloride ER 20 MEQ TBCR Take 2 tablets (40 meq) by mouth once daily   . tamsulosin (FLOMAX) 0.4 MG CAPS capsule Take 1  capsule (0.4 mg total) by mouth daily.   [name] acetaminophen (TYLENOL) 500 MG tablet Take 1 tablet (500 mg total) by mouth every 6 (six) hours as needed for mild pain or headache. 11/11/2019: Taking 1000 mg daily to twice daily   . albuterol (PROVENTIL HFA;VENTOLIN HFA) 108 (90 Base) MCG/ACT inhaler Inhale 2 puffs into the lungs every 6 (six) hours as needed for wheezing or shortness of breath. 02/24/2020: Need new Rx.  [name] azelastine (ASTELIN) 0.1 % nasal spray Place 2 sprays into both nostrils at bedtime.    . hydrocortisone (ANUSOL-HC) 2.5 % rectal cream Place 1 application rectally 2 (two) times daily.   . sildenafil (REVATIO) 20 MG tablet TAKE 1 TO 5 TABLETS AS NEEDED.   [name] tiZANidine (ZANAFLEX) 4 MG tablet Take 1 tablet (4 mg total) by mouth every 6 (six) hours as needed for muscle spasms.    No facility-administered encounter medications on file as of 03/15/2020.     Objective:   Goals Addressed            This Visit's Progress     Patient Stated   . PharmD "I want to control my diabetes" (pt-stated)       CARE PLAN ENTRY (see longtitudinal plan of care for additional care plan information)  Current Barriers:  . Diabetes: uncontrolled; most recent A1c 7.1%  o Reports recovering well from COVID. Denies lingering cough or SOB. Does note that cardiology has scheduled  an ECHO  o Wonders about getting diabetic shoes. Notes he saw Dr. Wynetta Emery for this last month, but hasn't heard anything from anyone. . Current antihyperglycemic regimen: metformin 1000 mg BID . Current meal patterns:  o Drinks: trying to drink more water; diet soda o Breakfast: couple of eggs, bologna, sometimes precooked bacon o Midmorning snack: chicken biscuit (4 in pack from grocery store) o Lunch: diet soda; banana, pack of potato chips o Supper: sometimes potato salad, sometimes pinto beans; sometimes fried chicken . Current blood glucose readings:  o Reports fastings in 150s . Cardiovascular risk reduction  (follows w Dr. Saunders Revel) o Current hypertensive regimen: amlodipine 5 mg daily, chlorthalidone 25 mg daily, furosemide 20 mg daily, metoprolol tartrate 12.5 mg BID; reports he did receive BP machined mailed to him; home BP readings 110-120s/60-70s.  - K remains low. Continues potassium 40 mEq daily per cardiology o Current hyperlipidemia regimen: atorvastatin 20 mg daily; LDL at goal <70  Pharmacist Clinical Goal(s):  [name] Over the next 90 days, patient with work with PharmD and primary care provider to address optimized self-management of diabetes  Interventions: . Comprehensive medication review, medication list updated in electronic medical record.  . Reviewed goal A1c, goal fasting, goal post prandial glucose . Reviewed previous appt where need for diabetic shoes was noted. Will collaborate w/ PCP about next steps - unsure where order for shoes was sent by Dr. Wynetta Emery . Praised for regular BP checks and documentation of control.  . Extensive dietary discussion. Discussed reduced carbohydrates, particularly with snacks like bananas and potato chips. Reviewed incorporating protein into snacks, like cheese, meats, protein bars, protein shakes. He verbalized understanding.  Patient Self Care Activities:  . Patient will check blood glucose daily , document, and provide at future appointments . Patient will take medications as prescribed . Patient will contact provider with any episodes of hypoglycemia . Patient will report any questions or concerns to provider   Please see past updates related to this goal by clicking on the "Past Updates" button in the selected goal          Plan:  - Scheduled f/u call 05/03/20  Catie Darnelle Maffucci, PharmD, [place] [phone]

## 2020-03-24 ENCOUNTER — Other Ambulatory Visit: Payer: Self-pay | Admitting: Physician Assistant

## 2020-03-24 DIAGNOSIS — R0600 Dyspnea, unspecified: Secondary | ICD-10-CM

## 2020-03-28 ENCOUNTER — Other Ambulatory Visit: Payer: Self-pay

## 2020-03-28 ENCOUNTER — Ambulatory Visit (INDEPENDENT_AMBULATORY_CARE_PROVIDER_SITE_OTHER): Payer: Medicare Other

## 2020-03-28 ENCOUNTER — Other Ambulatory Visit: Payer: Self-pay | Admitting: Nurse Practitioner

## 2020-03-28 VITALS — BP 122/70 | HR 68 | Temp 97.6°F | Resp 15 | Ht 75.0 in | Wt 199.0 lb

## 2020-03-28 DIAGNOSIS — Z0000 Encounter for general adult medical examination without abnormal findings: Secondary | ICD-10-CM | POA: Diagnosis not present

## 2020-03-28 NOTE — Progress Notes (Signed)
Subjective:   Brendan [name] is a 69 y.o. male who presents for Medicare Annual/Subsequent preventive examination.    Review of Systems:   Cardiac Risk Factors include: male gender;advanced age (>28men, >58 women);dyslipidemia;hypertension;diabetes mellitus     Objective:    Vitals: BP 122/70 (BP Location: Left Arm, Patient Position: Sitting, Cuff Size: Normal)   Pulse 68   Temp 97.6 F (36.4 C) (Temporal)   Resp 15   Ht 6\' 3"  (1.905 m)   Wt 199 lb (90.3 kg)   SpO2 94%   BMI 24.87 kg/m   Body mass index is 24.87 kg/m.  Advanced Directives 03/28/2020 03/26/2019 03/20/2018 07/02/2017 06/18/2017 06/04/2017 03/20/2017  Does Patient Have a Medical Advance Directive? No No No No No No No  Would patient like information on creating a medical advance directive? Yes (MAU/Ambulatory/Procedural Areas - Information given) Yes (MAU/Ambulatory/Procedural Areas - Information given) Yes (MAU/Ambulatory/Procedural Areas - Information given) No - Patient declined - No - Patient declined Yes (MAU/Ambulatory/Procedural Areas - Information given)    Tobacco Social History   Tobacco Use  Smoking Status Former Smoker  . Packs/day: 1.00  . Years: 60.00  . Pack years: 60.00  . Types: Cigarettes  . Quit date: 06/29/2017  . Years since quitting: 2.7  Smokeless Tobacco Never Used     Counseling given: Not Answered   Clinical Intake:  Pre-visit preparation completed: Yes  Pain : No/denies pain     Nutritional Status: BMI of 19-24  Normal Nutritional Risks: None Diabetes: Yes CBG done?: No Did pt. bring in CBG monitor from home?: No  How often do you need to have someone help you when you read instructions, pamphlets, or other written materials from your doctor or pharmacy?: 1 - Never  Interpreter Needed?: No  Information entered by :: Jancarlos Thrun,LPN  Past Medical History:  Diagnosis Date  . CAD (coronary artery disease)    a. 05/2016 Lexi MV: EF 58%, no ischemia/infarct; b. 05/2017  Cath: LM 20-30, LAD 50p, 69m, D1 50p, D2 80ost, LCX 90/70ost, RCA 80/90p/m, RPL1 50p, EF 50-55%; c. 06/2017 CABG x 4 (LIMA->LAD, VG->D2,VG->OM, VG->PDA).  . Diabetes mellitus without complication (HCC)   . Diastolic dysfunction    a. 05/2016 Echo: EF 55%, Gr1 DD, mild TR/MR.  06/2016 Hyperlipidemia   . Hypertension   . Hypokalemia   . Personal history of tobacco use, presenting hazards to health 05/03/2016  . Post-op Afib    a. 06/2017 following CABG->short course of amio.   Past Surgical History:  Procedure Laterality Date  . BACK SURGERY    . CORONARY ARTERY BYPASS GRAFT N/A 07/01/2017   Procedure: CORONARY ARTERY BYPASS GRAFTING (CABG) x4, using left mammary artery and right greater saphenous vein harvested endoscopically;  Surgeon: 09/01/2017, MD;  Location: [facility] OR;  Service: Open Heart Surgery;  Laterality: N/A;  . KNEE SURGERY    . LEFT HEART CATH AND CORONARY ANGIOGRAPHY N/A 06/04/2017   Procedure: Left Heart Cath and Coronary Angiography;  Surgeon: 08/04/2017, MD;  Location: [facility] INVASIVE CV LAB;  Service: Cardiovascular;  Laterality: N/A;  . TEE WITHOUT CARDIOVERSION N/A 07/01/2017   Procedure: TRANSESOPHAGEAL ECHOCARDIOGRAM (TEE);  Surgeon: 09/01/2017, Donata Clay, MD;  Location: [facility] OR;  Service: Open Heart Surgery;  Laterality: N/A;   Family History  Problem Relation Age of Onset  . Hypertension Mother   . Diabetes Mother   . Heart disease Father 21       multiple MI's   Social History  Socioeconomic History  . Marital status: Widowed    Spouse name: Not on file  . Number of children: Not on file  . Years of education: Not on file  . Highest education level: 10th grade  Occupational History  . Occupation: works part time as Engineer, maintenance  . Smoking status: Former Smoker    Packs/day: 1.00    Years: 60.00    Pack years: 60.00    Types: Cigarettes    Quit date: 06/29/2017    Years since quitting: 2.7  . Smokeless tobacco: Never Used  Substance and Sexual Activity    . Alcohol use: No    Comment: previously drank heavily but quit ~ 21 yrs ago.  . Drug use: No  . Sexual activity: Yes  Other Topics Concern  . Not on file  Social History Narrative   Lives in [place] by himself.  Works as Copy.  Does not routinely exercise.   Social Determinants of Health   Financial Resource Strain: Low Risk   . Difficulty of Paying Living Expenses: Not hard at all  Food Insecurity: No Food Insecurity  . Worried About Programme researcher, broadcasting/film/video in the Last Year: Never true  . Ran Out of Food in the Last Year: Never true  Transportation Needs: No Transportation Needs  . Lack of Transportation (Medical): No  . Lack of Transportation (Non-Medical): No  Physical Activity: Inactive  . Days of Exercise per Week: 0 days  . Minutes of Exercise per Session: 0 min  Stress:   . Feeling of Stress :   Social Connections: Somewhat Isolated  . Frequency of Communication with Friends and Family: More than three times a week  . Frequency of Social Gatherings with Friends and Family: More than three times a week  . Attends Religious Services: More than 4 times per year  . Active Member of Clubs or Organizations: No  . Attends Banker Meetings: Never  . Marital Status: Widowed    Outpatient Encounter Medications as of 03/28/2020  Medication Sig  . acetaminophen (TYLENOL) 500 MG tablet Take 1 tablet (500 mg total) by mouth every 6 (six) hours as needed for mild pain or headache.  . albuterol (PROVENTIL HFA;VENTOLIN HFA) 108 (90 Base) MCG/ACT inhaler Inhale 2 puffs into the lungs every 6 (six) hours as needed for wheezing or shortness of breath.  [name] amLODipine (NORVASC) 5 MG tablet Take 1 tablet (5 mg total) by mouth daily.  [name] aspirin EC 81 MG tablet Take 1 tablet (81 mg total) by mouth daily.  [name] atorvastatin (LIPITOR) 20 MG tablet Take 1 tablet (20 mg total) by mouth daily.  [name] azelastine (ASTELIN) 0.1 % nasal spray Place 2 sprays into both nostrils at bedtime.   .  chlorthalidone (HYGROTON) 25 MG tablet Take 1 tablet (25 mg total) by mouth daily.  . furosemide (LASIX) 20 MG tablet Take 1 tablet (20 mg total) by mouth daily.  . hydrocortisone (ANUSOL-HC) 2.5 % rectal cream Place 1 application rectally 2 (two) times daily.  [name] loratadine (CLARITIN) 10 MG tablet Take 1 tablet (10 mg total) by mouth daily.  . metFORMIN (GLUCOPHAGE) 1000 MG tablet Take 1 tablet (1,000 mg total) by mouth 2 (two) times daily with a meal.  . metoprolol tartrate (LOPRESSOR) 25 MG tablet Take 0.5 tablets (12.5 mg total) by mouth 2 (two) times daily.  . montelukast (SINGULAIR) 10 MG tablet Take 1 tablet (10 mg total) by mouth at bedtime.  . Potassium  Chloride ER 20 MEQ TBCR Take 2 tablets (40 meq) by mouth once daily  . sildenafil (REVATIO) 20 MG tablet TAKE 1 TO 5 TABLETS AS NEEDED.  [name] tamsulosin (FLOMAX) 0.4 MG CAPS capsule Take 1 capsule (0.4 mg total) by mouth daily.  [name] tiZANidine (ZANAFLEX) 4 MG tablet Take 1 tablet (4 mg total) by mouth every 6 (six) hours as needed for muscle spasms.   No facility-administered encounter medications on file as of 03/28/2020.    Activities of Daily Living In your present state of health, do you have any difficulty performing the following activities: 03/28/2020  Hearing? Y  Comment no hearing aids  Vision? N  Comment eyeglasses, woodard  Difficulty concentrating or making decisions? N  Walking or climbing stairs? Y  Dressing or bathing? N  Doing errands, shopping? N  Preparing Food and eating ? N  Using the Toilet? N  In the past six months, have you accidently leaked urine? N  Do you have problems with loss of bowel control? N  Managing your Medications? N  Managing your Finances? N  Housekeeping or managing your Housekeeping? N  Some recent data might be hidden    Patient Care Team: [name], NP as PCP - General (Nurse Practitioner) End, [name], MD as PCP - Cardiology (Cardiology) End, [name], MD as Consulting  Physician (Cardiology) Prescott Gum, Collier Salina, MD as Consulting Physician (Cardiothoracic Surgery) De Hollingshead, [facility] as Pharmacist (Pharmacist) Minor, Dalbert Garnet, RN (Inactive) as [place] Management   Assessment:   This is a routine wellness examination for Matias.  Exercise Activities and Dietary recommendations Current Exercise Habits: The patient has a physically strenuous job, but has no regular exercise apart from work.(walking a lot at [employer]), Exercise limited by: None identified  Goals Addressed   None     Fall Risk: Fall Risk  03/28/2020 05/12/2019 03/26/2019 05/13/2018 03/20/2018  Falls in the past year? 0 0 0 No No  Number falls in past yr: 0 - - - -  Injury with Fall? 0 - - - -  Follow up - Falls evaluation completed - - -    FALL RISK PREVENTION PERTAINING TO THE HOME:  Any stairs in or around the home? No  If so, are there any without handrails? No   Home free of loose throw rugs in walkways, pet beds, electrical cords, etc? Yes  Adequate lighting in your home to reduce risk of falls? Yes   ASSISTIVE DEVICES UTILIZED TO PREVENT FALLS:  Life alert? No  Use of a cane, walker or w/c? No  Grab bars in the bathroom? Yes  Shower chair or bench in shower? No  Elevated toilet seat or a handicapped toilet? No   TIMED UP AND GO:  Was the test performed? Yes .  Length of time to ambulate 10 feet: 9 sec.   GAIT:  Appearance of gait: Gait steady and fast without the use of an assistive device.  Education: Fall risk prevention has been discussed.  Intervention(s) required? No  DME/home health order needed?  No   Depression Screen PHQ 2/9 Scores 03/28/2020 03/26/2019 05/13/2018 03/20/2018  PHQ - 2 Score 0 0 0 0  PHQ- 9 Score - - - -    Cognitive Function     6CIT Screen 03/26/2019 03/20/2018  What Year? 0 points 0 points  What month? 0 points 0 points  What time? 0 points 0 points  Count back from 20 0 points 0 points  Months  in reverse 0  points 0 points  Repeat phrase 2 points 2 points  Total Score 2 2    Immunization History  Administered Date(s) Administered  . Fluad Quad(high Dose 65+) 11/23/2019  . Influenza, High Dose Seasonal PF 09/17/2016, 11/20/2017, 11/24/2018  . Influenza,inj,Quad PF,6+ Mos 11/30/2015  . Influenza-Unspecified 11/11/2013, 11/30/2015  . Moderna SARS-COVID-2 Vaccination 02/27/2020, 03/26/2020  . Pneumococcal Conjugate-13 05/10/2014  . Pneumococcal Polysaccharide-23 09/15/2007, 03/19/2017  . Td 08/16/2005  . Zoster 07/31/2011    Qualifies for Shingles Vaccine? Yes  Zostavax completed 2012. Due for Shingrix. Education has been provided regarding the importance of this vaccine. Pt has been advised to call insurance company to determine out of pocket expense. Advised may also receive vaccine at local pharmacy or Health Dept. Verbalized acceptance and understanding.  Tdap: Although this vaccine is not a covered service during a Wellness Exam, does the patient still wish to receive this vaccine today?  No .  Education has been provided regarding the importance of this vaccine. Advised may receive this vaccine at local pharmacy or Health Dept. Aware to provide a copy of the vaccination record if obtained from local pharmacy or Health Dept. Verbalized acceptance and understanding.  Flu Vaccine: up to date   Pneumococcal Vaccine: up to date   Covid-19 Vaccine: Completed vaccines  Screening Tests Health Maintenance  Topic Date Due  . OPHTHALMOLOGY EXAM  10/28/2018  . TETANUS/TDAP  03/28/2021 (Originally 08/17/2015)  . HEMOGLOBIN A1C  08/23/2020  . URINE MICROALBUMIN  02/23/2021  . FOOT EXAM  02/24/2021  . COLONOSCOPY  04/15/2024  . INFLUENZA VACCINE  Completed  . Hepatitis C Screening  Completed  . PNA vac Low Risk Adult  Completed   Cancer Screenings:  Colorectal Screening: Completed 2015. Repeat every 10 years  Lung Cancer Screening: (Low Dose CT Chest recommended if Age 93-80 years, 30  pack-year currently smoking OR have quit w/in 15years.) does qualify.   Completed 06/23/2019  Additional Screening:  Hepatitis C Screening: does qualify; Completed 2016  Vision Screening: Recommended annual ophthalmology exams for early detection of glaucoma and other disorders of the eye. Is the patient up to date with their annual eye exam?  Yes  Who is the provider or what is the name of the office in which the pt attends annual eye exams? Dr.Woodard  Has appt 04/04/2020 with Dr.Woodard   Dental Screening: Recommended annual dental exams for proper oral hygiene  Community Resource Referral:  CRR required this visit?  Yes   Requesting handrails for bathroom.       Plan:  I have personally reviewed and addressed the Medicare Annual Wellness questionnaire and have noted the following in the patient's chart:  A. Medical and social history B. Use of alcohol, tobacco or illicit drugs  C. Current medications and supplements D. Functional ability and status E.  Nutritional status F.  Physical activity G. Advance directives H. List of other physicians I.  Hospitalizations, surgeries, and ER visits in previous 12 months J.  Vitals K. Screenings such as hearing and vision if needed, cognitive and depression L. Referrals and appointments   In addition, I have reviewed and discussed with patient certain preventive protocols, quality metrics, and best practice recommendations. A written personalized care plan for preventive services as well as general preventive health recommendations were provided to patient.   Signed,   Collene Schlichter, LPN  [id number] Nurse Health Advisor   Nurse Notes: requesting update on diabetic shoes.

## 2020-03-28 NOTE — Patient Instructions (Signed)
Brendan [name] , Thank you for taking time to come for your Medicare Wellness Visit. I appreciate your ongoing commitment to your health goals. Please review the following plan we discussed and let me know if I can assist you in the future.   Screening recommendations/referrals: Colonoscopy: 2015, due 2025 Recommended yearly ophthalmology/optometry visit for glaucoma screening and checkup Recommended yearly dental visit for hygiene and checkup  Vaccinations: Influenza vaccine: up to date  Pneumococcal vaccine: up to date  Tdap vaccine: up to date  Shingles vaccine: shingrix eligible    Covid-19: completed   Advanced directives: Advance directive discussed with you today. I have provided a copy for you to complete at home and have notarized. Once this is complete please bring a copy in to our office so we can scan it into your chart.  Conditions/risks identified: diabetic  Next appointment: Follow up in one year for your annual wellness visit   Preventive Care 65 Years and Older, Male Preventive care refers to lifestyle choices and visits with your health care provider that can promote health and wellness. What does preventive care include?  A yearly physical exam. This is also called an annual well check.  Dental exams once or twice a year.  Routine eye exams. Ask your health care provider how often you should have your eyes checked.  Personal lifestyle choices, including:  Daily care of your teeth and gums.  Regular physical activity.  Eating a healthy diet.  Avoiding tobacco and drug use.  Limiting alcohol use.  Practicing safe sex.  Taking low doses of aspirin every day.  Taking vitamin and mineral supplements as recommended by your health care provider. What happens during an annual well check? The services and screenings done by your health care provider during your annual well check will depend on your age, overall health, lifestyle risk factors, and family  history of disease. Counseling  Your health care provider may ask you questions about your:  Alcohol use.  Tobacco use.  Drug use.  Emotional well-being.  Home and relationship well-being.  Sexual activity.  Eating habits.  History of falls.  Memory and ability to understand (cognition).  Work and work Astronomer. Screening  You may have the following tests or measurements:  Height, weight, and BMI.  Blood pressure.  Lipid and cholesterol levels. These may be checked every 5 years, or more frequently if you are over 71 years old.  Skin check.  Lung cancer screening. You may have this screening every year starting at age 39 if you have a 30-pack-year history of smoking and currently smoke or have quit within the past 15 years.  Fecal occult blood test (FOBT) of the stool. You may have this test every year starting at age 82.  Flexible sigmoidoscopy or colonoscopy. You may have a sigmoidoscopy every 5 years or a colonoscopy every 10 years starting at age 58.  Prostate cancer screening. Recommendations will vary depending on your family history and other risks.  Hepatitis C blood test.  Hepatitis B blood test.  Sexually transmitted disease (STD) testing.  Diabetes screening. This is done by checking your blood sugar (glucose) after you have not eaten for a while (fasting). You may have this done every 1-3 years.  Abdominal aortic aneurysm (AAA) screening. You may need this if you are a current or former smoker.  Osteoporosis. You may be screened starting at age 45 if you are at high risk. Talk with your health care provider about your test results,  treatment options, and if necessary, the need for more tests. Vaccines  Your health care provider may recommend certain vaccines, such as:  Influenza vaccine. This is recommended every year.  Tetanus, diphtheria, and acellular pertussis (Tdap, Td) vaccine. You may need a Td booster every 10 years.  Zoster vaccine.  You may need this after age 59.  Pneumococcal 13-valent conjugate (PCV13) vaccine. One dose is recommended after age 29.  Pneumococcal polysaccharide (PPSV23) vaccine. One dose is recommended after age 32. Talk to your health care provider about which screenings and vaccines you need and how often you need them. This information is not intended to replace advice given to you by your health care provider. Make sure you discuss any questions you have with your health care provider. Document Released: 01/13/2016 Document Revised: 09/05/2016 Document Reviewed: 10/18/2015 Elsevier Interactive Patient Education  2017 [place] Prevention in the Home Falls can cause injuries. They can happen to people of all ages. There are many things you can do to make your home safe and to help prevent falls. What can I do on the outside of my home?  Regularly fix the edges of walkways and driveways and fix any cracks.  Remove anything that might make you trip as you walk through a door, such as a raised step or threshold.  Trim any bushes or trees on the path to your home.  Use bright outdoor lighting.  Clear any walking paths of anything that might make someone trip, such as rocks or tools.  Regularly check to see if handrails are loose or broken. Make sure that both sides of any steps have handrails.  Any raised decks and porches should have guardrails on the edges.  Have any leaves, snow, or ice cleared regularly.  Use sand or salt on walking paths during winter.  Clean up any spills in your garage right away. This includes oil or grease spills. What can I do in the bathroom?  Use night lights.  Install grab bars by the toilet and in the tub and shower. Do not use towel bars as grab bars.  Use non-skid mats or decals in the tub or shower.  If you need to sit down in the shower, use a plastic, non-slip stool.  Keep the floor dry. Clean up any water that spills on the floor as soon  as it happens.  Remove soap buildup in the tub or shower regularly.  Attach bath mats securely with double-sided non-slip rug tape.  Do not have throw rugs and other things on the floor that can make you trip. What can I do in the bedroom?  Use night lights.  Make sure that you have a light by your bed that is easy to reach.  Do not use any sheets or blankets that are too big for your bed. They should not hang down onto the floor.  Have a firm chair that has side arms. You can use this for support while you get dressed.  Do not have throw rugs and other things on the floor that can make you trip. What can I do in the kitchen?  Clean up any spills right away.  Avoid walking on wet floors.  Keep items that you use a lot in easy-to-reach places.  If you need to reach something above you, use a strong step stool that has a grab bar.  Keep electrical cords out of the way.  Do not use floor polish or wax that makes floors  slippery. If you must use wax, use non-skid floor wax.  Do not have throw rugs and other things on the floor that can make you trip. What can I do with my stairs?  Do not leave any items on the stairs.  Make sure that there are handrails on both sides of the stairs and use them. Fix handrails that are broken or loose. Make sure that handrails are as long as the stairways.  Check any carpeting to make sure that it is firmly attached to the stairs. Fix any carpet that is loose or worn.  Avoid having throw rugs at the top or bottom of the stairs. If you do have throw rugs, attach them to the floor with carpet tape.  Make sure that you have a light switch at the top of the stairs and the bottom of the stairs. If you do not have them, ask someone to add them for you. What else can I do to help prevent falls?  Wear shoes that:  Do not have high heels.  Have rubber bottoms.  Are comfortable and fit you well.  Are closed at the toe. Do not wear sandals.  If  you use a stepladder:  Make sure that it is fully opened. Do not climb a closed stepladder.  Make sure that both sides of the stepladder are locked into place.  Ask someone to hold it for you, if possible.  Clearly mark and make sure that you can see:  Any grab bars or handrails.  First and last steps.  Where the edge of each step is.  Use tools that help you move around (mobility aids) if they are needed. These include:  Canes.  Walkers.  Scooters.  Crutches.  Turn on the lights when you go into a dark area. Replace any light bulbs as soon as they burn out.  Set up your furniture so you have a clear path. Avoid moving your furniture around.  If any of your floors are uneven, fix them.  If there are any pets around you, be aware of where they are.  Review your medicines with your doctor. Some medicines can make you feel dizzy. This can increase your chance of falling. Ask your doctor what other things that you can do to help prevent falls. This information is not intended to replace advice given to you by your health care provider. Make sure you discuss any questions you have with your health care provider. Document Released: 10/13/2009 Document Revised: 05/24/2016 Document Reviewed: 01/21/2015 Elsevier Interactive Patient Education  2017 [employer].

## 2020-04-04 DIAGNOSIS — E119 Type 2 diabetes mellitus without complications: Secondary | ICD-10-CM | POA: Diagnosis not present

## 2020-04-04 DIAGNOSIS — H35033 Hypertensive retinopathy, bilateral: Secondary | ICD-10-CM | POA: Diagnosis not present

## 2020-04-04 DIAGNOSIS — H2513 Age-related nuclear cataract, bilateral: Secondary | ICD-10-CM | POA: Diagnosis not present

## 2020-04-04 LAB — HM DIABETES EYE EXAM

## 2020-04-12 ENCOUNTER — Ambulatory Visit (INDEPENDENT_AMBULATORY_CARE_PROVIDER_SITE_OTHER): Payer: Medicare Other

## 2020-04-12 ENCOUNTER — Other Ambulatory Visit: Payer: Self-pay

## 2020-04-12 DIAGNOSIS — R0600 Dyspnea, unspecified: Secondary | ICD-10-CM

## 2020-04-14 ENCOUNTER — Telehealth: Payer: Self-pay

## 2020-04-14 DIAGNOSIS — R0602 Shortness of breath: Secondary | ICD-10-CM

## 2020-04-14 NOTE — Telephone Encounter (Signed)
Call to patient to review echo results and POC.     Pt verbalized understanding and has no further questions at this time.    Advised pt to call for any further questions or concerns.  Order placed for myoview stress test. Instructions reviewed verbally and sent in the mail.   routing to scheduling for appt.

## 2020-04-14 NOTE — Telephone Encounter (Signed)
-----   Message from Sondra Barges, PA-C sent at 04/13/2020  6:14 PM EDT ----- Echo showed normal pump function, normal relaxation of the heart, no significant valvular abnormalities, and a mildly dilated root. Unable to evaluate potential wall motion abnormalities. Given new EKG changes, and inability to evaluate wall motion on echo, please schedule him for a [employer]. His mildly dilated aortic root can be evaluated again in 12 months.

## 2020-04-15 NOTE — Telephone Encounter (Signed)
Attempted to schedule no ans no vm  

## 2020-04-18 ENCOUNTER — Telehealth: Payer: Self-pay | Admitting: Nurse Practitioner

## 2020-04-18 ENCOUNTER — Telehealth: Payer: Self-pay | Admitting: Podiatry

## 2020-04-18 NOTE — Telephone Encounter (Signed)
Spoke with [name] regarding his diabetic shoes-relayed the message from [facility] about needing to make an appointment-patient verbalized understanding and said he would call to make an appointment.

## 2020-04-18 NOTE — Telephone Encounter (Signed)
Copied from CRM [id number]. Topic: General - Inquiry >> Apr 18, 2020  3:11 PM Lynne Logan D wrote: Reason for CRM: Pt stated Dr. Laural Benes was supposed to send an order/rx for diabetic shoes to [facility] (?) back in February and he wanted to follow up on status. Stated he never heard from [facility] (?) Please advise.

## 2020-04-18 NOTE — Telephone Encounter (Signed)
Spoke with Dawn at [facility] in [place]--patient hasn't been seen at their office since January of 2020, so he needs to make an appointment with them and also see Rick at [facility] to get sized for diabetic shoes.

## 2020-04-18 NOTE — Telephone Encounter (Signed)
°  Community Resource Referral   [facility] 04/18/2020    Name: Brendan [name]    MRN: [id number]    DOB: June 05, 1951    AGE: 69 y.o.    GENDER: male    PCP Marjie Skiff, NP.   Called pt regarding [employer] Referral for grab bars for shower.Gave pt information for [employer] [place] and [place] [employer]. He also stated that he had not heard back from [facility] from the Prescription for Diabetic shoes. Transferred call to CFP. Manuela Schwartz  Care Guide  Embedded Care Coordination [facility] Management [email or web address] [email or web address]   [phone]

## 2020-05-02 ENCOUNTER — Other Ambulatory Visit: Payer: Self-pay

## 2020-05-02 ENCOUNTER — Encounter
Admission: RE | Admit: 2020-05-02 | Discharge: 2020-05-02 | Disposition: A | Discharge status: Home / Self Care | Payer: Medicare Other | Source: Ambulatory Visit | Attending: Physician Assistant | Admitting: Physician Assistant

## 2020-05-02 DIAGNOSIS — R0602 Shortness of breath: Secondary | ICD-10-CM | POA: Diagnosis not present

## 2020-05-02 LAB — NM MYOCAR MULTI W/SPECT W/WALL MOTION / EF
LV dias vol: 83 mL (ref 62–150)
LV sys vol: 34 mL
Peak HR: 97 {beats}/min
Percent HR: 63 %
Rest HR: 60 {beats}/min
SDS: 0
SRS: 5
SSS: 0
TID: 1.08

## 2020-05-02 MED ORDER — REGADENOSON 0.4 MG/5ML IV SOLN
0.4000 mg | Freq: Once | INTRAVENOUS | Status: AC
  Administered 2020-05-02: 0.4 mg via INTRAVENOUS
  Filled 2020-05-02: qty 5

## 2020-05-02 MED ORDER — TECHNETIUM TC 99M TETROFOSMIN IV KIT
10.0000 | PACK | Freq: Once | INTRAVENOUS | Status: AC | PRN
  Administered 2020-05-02: 10.57 via INTRAVENOUS

## 2020-05-02 MED ORDER — TECHNETIUM TC 99M TETROFOSMIN IV KIT
28.8100 | PACK | Freq: Once | INTRAVENOUS | Status: AC | PRN
  Administered 2020-05-02: 28.81 via INTRAVENOUS

## 2020-05-03 ENCOUNTER — Ambulatory Visit (INDEPENDENT_AMBULATORY_CARE_PROVIDER_SITE_OTHER): Payer: Medicare Other | Admitting: Pharmacist

## 2020-05-03 ENCOUNTER — Telehealth: Payer: Self-pay

## 2020-05-03 DIAGNOSIS — E1159 Type 2 diabetes mellitus with other circulatory complications: Secondary | ICD-10-CM

## 2020-05-03 DIAGNOSIS — I10 Essential (primary) hypertension: Secondary | ICD-10-CM

## 2020-05-03 DIAGNOSIS — E1165 Type 2 diabetes mellitus with hyperglycemia: Secondary | ICD-10-CM | POA: Diagnosis not present

## 2020-05-03 NOTE — Telephone Encounter (Signed)
-----   Message from Sondra Barges, PA-C sent at 05/02/2020  2:21 PM EDT ----- Stress test was low risk with normal pump function. Reassuring study.

## 2020-05-03 NOTE — Telephone Encounter (Signed)
Patient calling to discuss recent testing results  ° °Please call  ° °

## 2020-05-03 NOTE — Telephone Encounter (Signed)
Results called to pt. Pt verbalized understanding.  

## 2020-05-03 NOTE — Patient Instructions (Addendum)
Brendan [name],   It was great talking with you today! Here is how I would take your medications, and what each is for:   Morning: - Furosemide 20 mg (fluid pill) - Amlodipine 5 mg (blood pressure) - Chlorthalidone 25 mg (blood pressure) - Metoprolol tartrate 12.5 mg - 1/2 tablet  (blood pressure) - Aspirin 81 mg (heart protection) - Metformin 1000 mg (blood sugar) - Loratadine 10 mg (allergies) - Potassium 40 mEq - 2 tablets (potassium supplement)  Evening: - Montelukast 10 mg (allergies) - Tamsulosin 0.4 mg (prostate health) - Metoprolol tartrate 12.5 mg - 1/2 tablet (blood pressure) - Atorvastatin 20 mg (cholesterol) - Metformin 1000 mg (blood sugar)  Call me with any questions or concerns!  Visit Information  Goals Addressed            This Visit's Progress     Patient Stated   . COMPLETED: PharmD "I want to control my diabetes" (pt-stated)       CARE PLAN ENTRY (see longtitudinal plan of care for additional care plan information)  Current Barriers:  . Diabetes: uncontrolled; most recent A1c 7.1%  o Confirms that he spoke with the Care Guide about grab bars, and that he was given phone numbers, but that he needs someone who can install them for him. He also notes that he doesn't drive (doesn't have a license), so generally relies on his nephew to take him to appointments.  o S/p ECHO and stress test. Normal results.  o Appt w/ podiatry and diabetic shoe provider in the next few weeks  . Current antihyperglycemic regimen: metformin 1000 mg BID . Current blood glucose readings:  o Fastings 90-120s . Cardiovascular risk reduction (follows w Dr. Okey Dupre) o Current hypertensive regimen: amlodipine 5 mg daily, chlorthalidone 25 mg daily, furosemide 20 mg daily, metoprolol tartrate 12.5 mg BID; reports he did receive BP machined mailed to him; home BP readings 110-120s/60-70s; Potassium 40 mEq  o Current hyperlipidemia regimen: atorvastatin 20 mg daily; LDL at goal <70 . BPH:  tamsulosin 0.4 mg daily . Allergies/asthma: montelukast 10 mg QPM, loratadine 10 mg daily, azelastine nasal spray daily. Reports that he has not needed albuterol HFA inhaler in a few years.   Pharmacist Clinical Goal(s):  [name] Over the next 90 days, patient with work with PharmD and primary care provider to address optimized self-management of diabetes  Interventions: . Comprehensive medication review performed, medication list updated in electronic medical record . Inter-disciplinary care team collaboration (see longitudinal plan of care) . Praised for goal fasting blood sugars. Will be due for A1c w/ next PCP visit . Praised for normal cardiology testing . Will place Care Guide referral for transportation resources, and to discuss if there are local groups that could help with grab bar installation  . Will mail patient updated medication list with indications . Will collaborate w/ RN CM for follow up, as no medication regimen stable and no adherence concerns.   Patient Self Care Activities:  . Patient will check blood glucose daily , document, and provide at future appointments . Patient will take medications as prescribed . Patient will contact provider with any episodes of hypoglycemia . Patient will report any questions or concerns to provider   Please see past updates related to this goal by clicking on the "Past Updates" button in the selected goal         Patient verbalizes understanding of instructions provided today.   Plan:  - Patient has my contact information for future questions  or concerns  Catie Darnelle Maffucci, PharmD, [place] [phone]

## 2020-05-03 NOTE — Telephone Encounter (Signed)
Call attempted. No answer, no vm. 

## 2020-05-03 NOTE — Chronic Care Management (AMB) (Signed)
Chronic Care Management   Follow Up Note   05/03/2020 Name: Brendan [name] MRN: [id number] DOB: [birth year]  Referred by: [name], NP Reason for referral : Chronic Care Management (Medication Management)   Brendan [name] is a 69 y.o. year old male who is a primary care patient of [name], [name], NP. The CCM team was consulted for assistance with chronic disease management and care coordination needs.    Contacted patient for medication management review.   Review of patient status, including review of consultants reports, relevant laboratory and other test results, and collaboration with appropriate care team members and the patient's provider was performed as part of comprehensive patient evaluation and provision of chronic care management services.    SDOH (Social Determinants of Health) assessments performed: Yes See Care Plan activities for detailed interventions related to [facility])     Outpatient Encounter Medications as of 05/03/2020  Medication Sig Note  . amLODipine (NORVASC) 5 MG tablet Take 1 tablet (5 mg total) by mouth daily.   [name] aspirin EC 81 MG tablet Take 1 tablet (81 mg total) by mouth daily.   [name] atorvastatin (LIPITOR) 20 MG tablet Take 1 tablet (20 mg total) by mouth daily.   [name] azelastine (ASTELIN) 0.1 % nasal spray Place 2 sprays into both nostrils at bedtime.    . chlorthalidone (HYGROTON) 25 MG tablet Take 1 tablet (25 mg total) by mouth daily.   . furosemide (LASIX) 20 MG tablet Take 1 tablet (20 mg total) by mouth daily.   [name] loratadine (CLARITIN) 10 MG tablet Take 1 tablet (10 mg total) by mouth daily.   . metFORMIN (GLUCOPHAGE) 1000 MG tablet Take 1 tablet (1,000 mg total) by mouth 2 (two) times daily with a meal.   . metoprolol tartrate (LOPRESSOR) 25 MG tablet Take 0.5 tablets (12.5 mg total) by mouth 2 (two) times daily.   . montelukast (SINGULAIR) 10 MG tablet Take 1 tablet (10 mg total) by mouth at bedtime.   . Potassium Chloride ER 20 MEQ TBCR Take 2  tablets (40 meq) by mouth once daily   . tamsulosin (FLOMAX) 0.4 MG CAPS capsule Take 1 capsule (0.4 mg total) by mouth daily.   [name] acetaminophen (TYLENOL) 500 MG tablet Take 1 tablet (500 mg total) by mouth every 6 (six) hours as needed for mild pain or headache. 11/11/2019: Taking 1000 mg daily to twice daily   . albuterol (PROVENTIL HFA;VENTOLIN HFA) 108 (90 Base) MCG/ACT inhaler Inhale 2 puffs into the lungs every 6 (six) hours as needed for wheezing or shortness of breath. (Patient not taking: Reported on 05/03/2020)   . hydrocortisone (ANUSOL-HC) 2.5 % rectal cream Place 1 application rectally 2 (two) times daily.   . sildenafil (REVATIO) 20 MG tablet TAKE 1 TO 5 TABLETS AS NEEDED.   [name] tiZANidine (ZANAFLEX) 4 MG tablet Take 1 tablet (4 mg total) by mouth every 6 (six) hours as needed for muscle spasms.    No facility-administered encounter medications on file as of 05/03/2020.     Objective:   Goals Addressed            This Visit's Progress     Patient Stated   . COMPLETED: PharmD "I want to control my diabetes" (pt-stated)       CARE PLAN ENTRY (see longtitudinal plan of care for additional care plan information)  Current Barriers:  . Diabetes: uncontrolled; most recent A1c 7.1%  o Confirms that he spoke with the Care Guide about grab bars,  and that he was given phone numbers, but that he needs someone who can install them for him. He also notes that he doesn't drive (doesn't have a license), so generally relies on his nephew to take him to appointments.  o S/p ECHO and stress test. Normal results.  o Appt w/ podiatry and diabetic shoe provider in the next few weeks  . Current antihyperglycemic regimen: metformin 1000 mg BID . Current blood glucose readings:  o Fastings 90-120s . Cardiovascular risk reduction (follows w Dr. Okey Dupre) o Current hypertensive regimen: amlodipine 5 mg daily, chlorthalidone 25 mg daily, furosemide 20 mg daily, metoprolol tartrate 12.5 mg BID; reports he  did receive BP machined mailed to him; home BP readings 110-120s/60-70s; Potassium 40 mEq  o Current hyperlipidemia regimen: atorvastatin 20 mg daily; LDL at goal <70 . BPH: tamsulosin 0.4 mg daily . Allergies/asthma: montelukast 10 mg QPM, loratadine 10 mg daily, azelastine nasal spray daily. Reports that he has not needed albuterol HFA inhaler in a few years.   Pharmacist Clinical Goal(s):  [name] Over the next 90 days, patient with work with PharmD and primary care provider to address optimized self-management of diabetes  Interventions: . Comprehensive medication review performed, medication list updated in electronic medical record . Inter-disciplinary care team collaboration (see longitudinal plan of care) . Praised for goal fasting blood sugars. Will be due for A1c w/ next PCP visit . Praised for normal cardiology testing . Will place Care Guide referral for transportation resources, and to discuss if there are local groups that could help with grab bar installation  . Will mail patient updated medication list with indications . Will collaborate w/ RN CM for follow up, as no medication regimen stable and no adherence concerns.   Patient Self Care Activities:  . Patient will check blood glucose daily , document, and provide at future appointments . Patient will take medications as prescribed . Patient will contact provider with any episodes of hypoglycemia . Patient will report any questions or concerns to provider   Please see past updates related to this goal by clicking on the "Past Updates" button in the selected goal          Plan:  - Patient has my contact information for future questions or concerns  [name], PharmD, [facility] Clinical Pharmacist [facility] [phone]

## 2020-05-09 ENCOUNTER — Encounter: Payer: Self-pay | Admitting: Podiatry

## 2020-05-09 ENCOUNTER — Ambulatory Visit (INDEPENDENT_AMBULATORY_CARE_PROVIDER_SITE_OTHER): Payer: Medicare Other | Admitting: Podiatry

## 2020-05-09 ENCOUNTER — Other Ambulatory Visit: Payer: Self-pay

## 2020-05-09 DIAGNOSIS — I2510 Atherosclerotic heart disease of native coronary artery without angina pectoris: Secondary | ICD-10-CM | POA: Diagnosis not present

## 2020-05-09 DIAGNOSIS — E1142 Type 2 diabetes mellitus with diabetic polyneuropathy: Secondary | ICD-10-CM | POA: Diagnosis not present

## 2020-05-09 MED ORDER — GABAPENTIN 300 MG PO CAPS: ORAL_CAPSULE | ORAL | 3 refills | Status: DC

## 2020-05-09 NOTE — Progress Notes (Signed)
He presents today complaining of pain in his feet.  States that his diabetes is doing okay he still has pains in his feet and he feels that the plantar fasciitis may be acting up.  Objective: Vital signs are stable he is alert and oriented x3.  Pulses are palpable left as opposed to right.  I reviewed the last vascular study which demonstrated normal or calcified vessels bilaterally.  He also has no reproducible pain on palpation medial calcaneal tubercle.  No reproducible pain on the foot in general.  No open lesions or wounds are noted.  Moderate hammertoe deformities bilateral.  Assessment: Diabetic peripheral neuropathy is most likely the cause of his discomfort.  Hammertoes bilateral.  Plan: He already is scheduled to follow-up with [facility] for a new set of diabetic shoes.  I will start him on gabapentin 300 mg at nighttime to see if this will alleviate his symptoms.  Follow-up with him in 6 weeks

## 2020-05-18 ENCOUNTER — Telehealth: Payer: Self-pay | Admitting: Nurse Practitioner

## 2020-05-18 ENCOUNTER — Ambulatory Visit: Payer: Medicare Other | Admitting: Orthotics

## 2020-05-18 ENCOUNTER — Other Ambulatory Visit: Payer: Self-pay

## 2020-05-18 DIAGNOSIS — E1142 Type 2 diabetes mellitus with diabetic polyneuropathy: Secondary | ICD-10-CM

## 2020-05-18 DIAGNOSIS — I739 Peripheral vascular disease, unspecified: Secondary | ICD-10-CM

## 2020-05-18 NOTE — Telephone Encounter (Signed)
Email to Pt  From: Manuela Schwartz [facility])  Sent: Wednesday, May 18, 2020 4:22 PM To: Margaretann Loveless C [email or web address]> Subject: Secure: [place] [facility] [employer] [employer] -[employer], Please see attached referral form for Mr. Vamsi Apfel Her monthly SSI/Disability is [phone] Let me know if you need anything further,  Manuela Schwartz  Care Guide  Embedded Care Coordination [facility] Management [email or web address]   [phone]

## 2020-05-18 NOTE — Telephone Encounter (Signed)
Email to [facility]  From: Manuela Schwartz [facility])  Sent: Wednesday, May 18, 2020 4:22 PM To: Margaretann Loveless C [email or web address]> Subject: Secure: [place] [facility] -[employer] [place], Please see attached referral form for Mr. Asahd Can Her monthly SSI/Disability is [phone] Let me know if you need anything further,   Manuela Schwartz  Care Guide  Embedded Care Coordination [facility] Management [email or web address] [email or web address]   [phone]

## 2020-05-18 NOTE — Progress Notes (Signed)

## 2020-05-18 NOTE — Telephone Encounter (Signed)
  Community Resource Referral   [facility] 05/18/2020  DOB: [birth year]   AGE: 69 y.o.   GENDER: male   PCP Marjie Skiff, NP.   Called pt regarding Higher education careers adviser. Discussed [employer] and gave him # if he should need it if his nephew is not available to take him to his Doctors appointments.  Referral placed to [facility] for grab bars for his shower.   Follow up 05/25/20 to make sure he has heard from the agency.  Manuela Schwartz  Care Guide . Embedded Care Coordination [facility] Management [email or web address] [email or web address]  [phone]

## 2020-05-23 ENCOUNTER — Encounter: Payer: Self-pay | Admitting: Nurse Practitioner

## 2020-05-23 ENCOUNTER — Other Ambulatory Visit: Payer: Self-pay

## 2020-05-23 ENCOUNTER — Ambulatory Visit (INDEPENDENT_AMBULATORY_CARE_PROVIDER_SITE_OTHER): Payer: Medicare Other | Admitting: Nurse Practitioner

## 2020-05-23 VITALS — BP 118/70 | HR 61 | Temp 97.8°F | Wt 201.6 lb

## 2020-05-23 DIAGNOSIS — J432 Centrilobular emphysema: Secondary | ICD-10-CM | POA: Diagnosis not present

## 2020-05-23 DIAGNOSIS — E1165 Type 2 diabetes mellitus with hyperglycemia: Secondary | ICD-10-CM | POA: Diagnosis not present

## 2020-05-23 DIAGNOSIS — Z6825 Body mass index (BMI) 25.0-25.9, adult: Secondary | ICD-10-CM | POA: Insufficient documentation

## 2020-05-23 DIAGNOSIS — I10 Essential (primary) hypertension: Secondary | ICD-10-CM | POA: Diagnosis not present

## 2020-05-23 DIAGNOSIS — E785 Hyperlipidemia, unspecified: Secondary | ICD-10-CM

## 2020-05-23 DIAGNOSIS — E1169 Type 2 diabetes mellitus with other specified complication: Secondary | ICD-10-CM | POA: Diagnosis not present

## 2020-05-23 DIAGNOSIS — I2510 Atherosclerotic heart disease of native coronary artery without angina pectoris: Secondary | ICD-10-CM

## 2020-05-23 DIAGNOSIS — E1159 Type 2 diabetes mellitus with other circulatory complications: Secondary | ICD-10-CM

## 2020-05-23 DIAGNOSIS — I152 Hypertension secondary to endocrine disorders: Secondary | ICD-10-CM

## 2020-05-23 DIAGNOSIS — I700 Atherosclerosis of aorta: Secondary | ICD-10-CM

## 2020-05-23 DIAGNOSIS — E1140 Type 2 diabetes mellitus with diabetic neuropathy, unspecified: Secondary | ICD-10-CM | POA: Diagnosis not present

## 2020-05-23 LAB — BAYER DCA HB A1C WAIVED: HB A1C (BAYER DCA - WAIVED): 6.7 % (ref <7.0)

## 2020-05-23 MED ORDER — ALBUTEROL SULFATE HFA 108 (90 BASE) MCG/ACT IN AERS: 2.0000 | INHALATION_SPRAY | Freq: Four times a day (QID) | RESPIRATORY_TRACT | 4 refills | Status: DC | PRN

## 2020-05-23 NOTE — Assessment & Plan Note (Signed)
Continue ASA and Lipitor daily for prevention.  Quit smoking 2 years ago.   

## 2020-05-23 NOTE — Patient Instructions (Signed)
Neuropathic Pain Neuropathic pain is pain caused by damage to the nerves that are responsible for certain sensations in your body (sensory nerves). The pain can be caused by:  Damage to the sensory nerves that send signals to your spinal cord and brain (peripheral nervous system).  Damage to the sensory nerves in your brain or spinal cord (central nervous system). Neuropathic pain can make you more sensitive to pain. Even a minor sensation can feel very painful. This is usually a long-term condition that can be difficult to treat. The type of pain differs from person to person. It may:  Start suddenly (acute), or it may develop slowly and last for a long time (chronic).  Come and go as damaged nerves heal, or it may stay at the same level for years.  Cause emotional distress, loss of sleep, and a lower quality of life. What are the causes? The most common cause of this condition is diabetes. Many other diseases and conditions can also cause neuropathic pain. Causes of neuropathic pain can be classified as:  Toxic. This is caused by medicines and chemicals. The most common cause of toxic neuropathic pain is damage from cancer treatments (chemotherapy).  Metabolic. This can be caused by: ? Diabetes. This is the most common disease that damages the nerves. ? Lack of vitamin B from long-term alcohol abuse.  Traumatic. Any injury that cuts, crushes, or stretches a nerve can cause damage and pain. A common example is feeling pain after losing an arm or leg (phantom limb pain).  Compression-related. If a sensory nerve gets trapped or compressed for a long period of time, the blood supply to the nerve can be cut off.  Vascular. Many blood vessel diseases can cause neuropathic pain by decreasing blood supply and oxygen to nerves.  Autoimmune. This type of pain results from diseases in which the body's defense system (immune system) mistakenly attacks sensory nerves. Examples of autoimmune diseases  that can cause neuropathic pain include lupus and multiple sclerosis.  Infectious. Many types of viral infections can damage sensory nerves and cause pain. Shingles infection is a common cause of this type of pain.  Inherited. Neuropathic pain can be a symptom of many diseases that are passed down through families (genetic). What increases the risk? You are more likely to develop this condition if:  You have diabetes.  You smoke.  You drink too much alcohol.  You are taking certain medicines, including medicines that kill cancer cells (chemotherapy) or that treat immune system disorders. What are the signs or symptoms? The main symptom is pain. Neuropathic pain is often described as:  Burning.  Shock-like.  Stinging.  Hot or cold.  Itching. How is this diagnosed? No single test can diagnose neuropathic pain. It is diagnosed based on:  Physical exam and your symptoms. Your health care provider will ask you about your pain. You may be asked to use a pain scale to describe how bad your pain is.  Tests. These may be done to see if you have a high sensitivity to pain and to help find the cause and location of any sensory nerve damage. They include: ? Nerve conduction studies to test how well nerve signals travel through your sensory nerves (electrodiagnostic testing). ? Stimulating your sensory nerves through electrodes on your skin and measuring the response in your spinal cord and brain (somatosensory evoked potential).  Imaging studies, such as: ? X-rays. ? CT scan. ? MRI. How is this treated? Treatment for neuropathic pain may change   over time. You may need to try different treatment options or a combination of treatments. Some options include:  Treating the underlying cause of the neuropathy, such as diabetes, kidney disease, or vitamin deficiencies.  Stopping medicines that can cause neuropathy, such as chemotherapy.  Medicine to relieve pain. Medicines may  include: ? Prescription or over-the-counter pain medicine. ? Anti-seizure medicine. ? Antidepressant medicines. ? Pain-relieving patches that are applied to painful areas of skin. ? A medicine to numb the area (local anesthetic), which can be injected as a nerve block.  Transcutaneous nerve stimulation. This uses electrical currents to block painful nerve signals. The treatment is painless.  Alternative treatments, such as: ? Acupuncture. ? Meditation. ? Massage. ? Physical therapy. ? Pain management programs. ? Counseling. Follow these instructions at home: Medicines   Take over-the-counter and prescription medicines only as told by your health care provider.  Do not drive or use heavy machinery while taking prescription pain medicine.  If you are taking prescription pain medicine, take actions to prevent or treat constipation. Your health care provider may recommend that you: ? Drink enough fluid to keep your urine pale yellow. ? Eat foods that are high in fiber, such as fresh fruits and vegetables, whole grains, and beans. ? Limit foods that are high in fat and processed sugars, such as fried or sweet foods. ? Take an over-the-counter or prescription medicine for constipation. Lifestyle   Have a good support system at home.  Consider joining a chronic pain support group.  Do not use any products that contain nicotine or tobacco, such as cigarettes and e-cigarettes. If you need help quitting, ask your health care provider.  Do not drink alcohol. General instructions  Learn as much as you can about your condition.  Work closely with all your health care providers to find the treatment plan that works best for you.  Ask your health care provider what activities are safe for you.  Keep all follow-up visits as told by your health care provider. This is important. Contact a health care provider if:  Your pain treatments are not working.  You are having side effects  from your medicines.  You are struggling with tiredness (fatigue), mood changes, depression, or anxiety. Summary  Neuropathic pain is pain caused by damage to the nerves that are responsible for certain sensations in your body (sensory nerves).  Neuropathic pain may come and go as damaged nerves heal, or it may stay at the same level for years.  Neuropathic pain is usually a long-term condition that can be difficult to treat. Consider joining a chronic pain support group. This information is not intended to replace advice given to you by your health care provider. Make sure you discuss any questions you have with your health care provider. Document Revised: 04/09/2019 Document Reviewed: 01/03/2018 Elsevier Patient Education  2020 [employer] Inc.  

## 2020-05-23 NOTE — Assessment & Plan Note (Addendum)
Chronic, ongoing.  Continue current medication regimen and adjust as needed.  Lipid panel next visit.  Recent LDL 66.

## 2020-05-23 NOTE — Assessment & Plan Note (Signed)
Chronic, ongoing with BP at goal today and at goal on home readings.  Continue current medication regimen and adjust as needed.  Continue collaboration with cardiology.  Recommend he monitor BP at least a few times a week at home and document.  BMP next visit.

## 2020-05-23 NOTE — Assessment & Plan Note (Signed)
Recommended eating smaller high protein, low fat meals more frequently and exercising 30 mins a day 5 times a week with a goal of 10-15lb weight loss in the next 3 months. Patient voiced their understanding and motivation to adhere to these recommendations.  

## 2020-05-23 NOTE — Progress Notes (Signed)
BP 118/70   Pulse 61   Temp 97.8 F (36.6 C) (Oral)   Wt 201 lb 9.6 oz (91.4 kg)   SpO2 92%   BMI 25.20 kg/m    Subjective:    Patient ID: Brendan [name], male    DOB: 03-Dec-1951, 69 y.o.   MRN: [id number]  HPI: Brendan [name] is a 69 y.o. male  Chief Complaint  Patient presents with  . COPD  . Diabetes  . Hyperlipidemia  . Hypertension   DIABETES Last A1C 7.1% in February.  Continues on Metformin 1000 MG BID.  Was started on Gabapentin 300 MG QHS on 05/09/20 by podiatry for neuropathy.   Hypoglycemic episodes:no Polydipsia/polyuria: no Visual disturbance: no Chest pain: no Paresthesias: no Glucose Monitoring: yes  Accucheck frequency: Daily  Fasting glucose: 100 - 120 range, had one 97  Post prandial: 150-160  Evening:  Before meals: Taking Insulin?: no  Long acting insulin:  Short acting insulin: Blood Pressure Monitoring: not checking Retinal Examination: Not up to Date Foot Exam: Up to Date Pneumovax: Up to Date Influenza: Up to Date Aspirin: yes   HYPERTENSION / HYPERLIPIDEMIA Continues on Metoprolol, Lasix, Hygroton, Amlodipine, K+, and Lipitor. Followed by cardiology and last seen 03/01/2020 by PA Dunn.   Allergic to Lisinopril. Aortic atherosclerosis noted on recent lung CT screening.  Last echo showed EF 60-65% in 2021. Had repeat testing wall motion testing on 05/02/20 Satisfied with current treatment? yes Duration of hypertension: chronic BP monitoring frequency: a few days a week BP range: 110-125/70's range BP medication side effects: no Duration of hyperlipidemia: chronic Cholesterol medication side effects: no Cholesterol supplements: none Medication compliance: good compliance Aspirin: yes Recent stressors: no Recurrent headaches: no Visual changes: no Palpitations: no Dyspnea: no Chest pain: no Lower extremity edema: no Dizzy/lightheaded: no   COPD Has not smoked in 3 years since his bypass surgery.  Was Covid positive 12/30/2019, recovered  well and no further symptoms. FEV1 62% today and FEV1/FVC 72%.  Has Singulair he takes daily.  Has Albuterol inhaler, but does not use.  Last Lung CT screening 06/23/19.   COPD status: stable Satisfied with current treatment?: yes Oxygen use: no Dyspnea frequency: none Cough frequency: none Rescue inhaler frequency: minimal Limitation of activity: no Productive cough: none Last Spirometry: today Pneumovax: Up to Date Influenza: Up to Date   Relevant past medical, surgical, family and social history reviewed and updated as indicated. Interim medical history since our last visit reviewed. Allergies and medications reviewed and updated.  Review of Systems  Constitutional: Negative for activity change, diaphoresis, fatigue and fever.  Respiratory: Negative for cough, chest tightness, shortness of breath and wheezing.   Cardiovascular: Negative for chest pain, palpitations and leg swelling.  Gastrointestinal: Negative for abdominal distention, abdominal pain, constipation, diarrhea, nausea and vomiting.  Endocrine: Negative for cold intolerance, heat intolerance, polydipsia, polyphagia and polyuria.  Neurological: Negative for dizziness, syncope, weakness, light-headedness, numbness and headaches.  Psychiatric/Behavioral: Negative.     Per HPI unless specifically indicated above     Objective:    BP 118/70   Pulse 61   Temp 97.8 F (36.6 C) (Oral)   Wt 201 lb 9.6 oz (91.4 kg)   SpO2 92%   BMI 25.20 kg/m   Wt Readings from Last 3 Encounters:  05/23/20 201 lb 9.6 oz (91.4 kg)  03/28/20 199 lb (90.3 kg)  03/01/20 197 lb (89.4 kg)    Physical Exam Vitals and nursing note reviewed.  Constitutional:  General: He is awake. He is not in acute distress.    Appearance: He is well-developed and overweight. He is not ill-appearing.  HENT:     Head: Normocephalic and atraumatic.     Right Ear: Hearing normal. No drainage.     Left Ear: Hearing normal. No drainage.  Eyes:      General: Lids are normal.        Right eye: No discharge.        Left eye: No discharge.     Conjunctiva/sclera: Conjunctivae normal.     Pupils: Pupils are equal, round, and reactive to light.  Neck:     Vascular: No carotid bruit.  Cardiovascular:     Rate and Rhythm: Normal rate and regular rhythm.     Heart sounds: Normal heart sounds, S1 normal and S2 normal. No murmur. No gallop.   Pulmonary:     Effort: Pulmonary effort is normal. No accessory muscle usage or respiratory distress.     Breath sounds: Normal breath sounds.  Abdominal:     General: Bowel sounds are normal.     Palpations: Abdomen is soft.     Tenderness: There is no abdominal tenderness.   Musculoskeletal:        General: Normal range of motion.     Cervical back: Normal range of motion and neck supple.     Right lower leg: No edema.     Left lower leg: No edema.  Skin:    General: Skin is warm and dry.  Neurological:     Mental Status: He is alert and oriented to person, place, and time.  Psychiatric:        Attention and Perception: Attention normal.        Mood and Affect: Mood normal.        Speech: Speech normal.        Behavior: Behavior normal. Behavior is cooperative.     Results for orders placed or performed during the hospital encounter of 05/02/20  NM Myocar Multi W/Spect W/Wall Motion / EF  Result Value Ref Range   Rest HR 60 bpm   Rest BP 165/91 mmHg   Percent HR 63 %   Peak HR 97 bpm   Peak BP 149/92 mmHg   SSS 0    SRS 5    SDS 0    TID 1.08    LV sys vol 34 mL   LV dias vol 83 62 - 150 mL      Assessment & Plan:   Problem List Items Addressed This Visit      Cardiovascular and Mediastinum   Hypertension associated with diabetes (HCC)    Chronic, ongoing with BP at goal today and at goal on home readings.  Continue current medication regimen and adjust as needed.  Continue collaboration with cardiology.  Recommend he monitor BP at least a few times a week at home and  document.  BMP next visit.        Aortic atherosclerosis (HCC)    Continue ASA and Lipitor daily for prevention.  Quit smoking 2 years ago.          Respiratory   Centrilobular emphysema (HCC)    Chronic, stable with minimal use of Albuterol.  PFT today noting minimal change from previous with FEV1 62% and FEV1/FVC 72%.  Continue to monitor and adjust regimen as needed.  Sent refill of Albuterol and recommend he use this if wheezing present or SOB.  Continue cessation  of smoking.      Relevant Medications   albuterol (VENTOLIN HFA) 108 (90 Base) MCG/ACT inhaler   Other Relevant Orders   Spirometry with Graph (Completed)     Endocrine   Controlled type 2 diabetes with neuropathy (HCC) - Primary    Chronic, ongoing.  A1C 6.7%, decrease from previous. Urine ALB 10 and A:C <30 last visit. Continue current medication regimen and checking BS at home daily.  Discussed addition of Jardiance or Farxiga, which would offer heart benefit, but he wishes to hold off at this time.  Return in 3 months for follow-up, if elevation A1C add on SGLT2.  Continue Gabapentin as prescribed by podiatry and adjust as needed.  Return in 3 months.      Relevant Orders   Bayer DCA Hb A1c Waived   Hyperlipidemia associated with type 2 diabetes mellitus (HCC)    Chronic, ongoing.  Continue current medication regimen and adjust as needed.  Lipid panel next visit.  Recent LDL 66.        Other   BMI 25.0-25.9,adult    Recommended eating smaller high protein, low fat meals more frequently and exercising 30 mins a day 5 times a week with a goal of 10-15lb weight loss in the next 3 months. Patient voiced their understanding and motivation to adhere to these recommendations.           Follow up plan: Return in about 3 months (around 08/23/2020) for T2DM, HTN/HLD, COPD.

## 2020-05-23 NOTE — Assessment & Plan Note (Signed)
Chronic, ongoing.  A1C 6.7%, decrease from previous. Urine ALB 10 and A:C <30 last visit. Continue current medication regimen and checking BS at home daily.  Discussed addition of Jardiance or Farxiga, which would offer heart benefit, but he wishes to hold off at this time.  Return in 3 months for follow-up, if elevation A1C add on SGLT2.  Continue Gabapentin as prescribed by podiatry and adjust as needed.  Return in 3 months.

## 2020-05-23 NOTE — Assessment & Plan Note (Signed)
Chronic, stable with minimal use of Albuterol.  PFT today noting minimal change from previous with FEV1 62% and FEV1/FVC 72%.  Continue to monitor and adjust regimen as needed.  Sent refill of Albuterol and recommend he use this if wheezing present or SOB.  Continue cessation of smoking.

## 2020-06-16 ENCOUNTER — Other Ambulatory Visit: Payer: Self-pay | Admitting: Nurse Practitioner

## 2020-06-16 NOTE — Telephone Encounter (Signed)
Requested Prescriptions  Pending Prescriptions Disp Refills  . sildenafil (REVATIO) 20 MG tablet [Pharmacy Med Name: SILDENAFIL 20 MG TABLET] 30 tablet 0    Sig: TAKE 1 TO 5 TABLETS AS NEEDED.     Urology: Erectile Dysfunction Agents Passed - 06/16/2020 12:21 PM      Passed - Last BP in normal range    BP Readings from Last 1 Encounters:  05/23/20 118/70         Passed - Valid encounter within last 12 months    Recent Outpatient Visits          3 weeks ago Controlled type 2 diabetes with neuropathy (HCC)   [facility] Coggon, H. Rivera Colen T, NP   3 months ago Hammer toe of right foot   [facility], Megan P, DO   3 months ago Type 2 diabetes mellitus with hyperglycemia, without long-term current use of insulin (HCC)   [facility] [name], Corrie Dandy T, NP   5 months ago Lab test positive for detection of COVID-19 virus   [facility] [place], [place] T, NP   5 months ago Cough   [facility] Cade, Dorie Rank, NP      Future Appointments            In 2 months [name], Dorie Rank, NP [facility]   In 2 months End, Cristal Deer, MD [id number]   In 9 months  [facility]

## 2020-06-20 ENCOUNTER — Other Ambulatory Visit: Payer: Self-pay

## 2020-06-20 ENCOUNTER — Telehealth: Payer: Self-pay

## 2020-06-20 ENCOUNTER — Ambulatory Visit (INDEPENDENT_AMBULATORY_CARE_PROVIDER_SITE_OTHER): Payer: Medicare Other | Admitting: Podiatry

## 2020-06-20 ENCOUNTER — Encounter: Payer: Self-pay | Admitting: Podiatry

## 2020-06-20 DIAGNOSIS — Z122 Encounter for screening for malignant neoplasm of respiratory organs: Secondary | ICD-10-CM

## 2020-06-20 DIAGNOSIS — E1142 Type 2 diabetes mellitus with diabetic polyneuropathy: Secondary | ICD-10-CM | POA: Diagnosis not present

## 2020-06-20 DIAGNOSIS — I2510 Atherosclerotic heart disease of native coronary artery without angina pectoris: Secondary | ICD-10-CM | POA: Diagnosis not present

## 2020-06-20 DIAGNOSIS — Z87891 Personal history of nicotine dependence: Secondary | ICD-10-CM

## 2020-06-20 MED ORDER — GABAPENTIN 300 MG PO CAPS
ORAL_CAPSULE | ORAL | 3 refills | Status: DC
Start: 2020-06-20 — End: 2020-07-27

## 2020-06-20 NOTE — Progress Notes (Signed)
He presents today for follow-up of his diabetic peripheral neuropathy states that he is taken his gabapentin 300 in the morning 300 at bedtime and seems to be helping.  Objective: Vital signs are stable he is alert oriented x3.  Pulses remain palpable no open lesions or wounds.  Assessment: Diabetic peripheral neuropathy.  Plan: Continue 300 in the morning increase to 600 at bedtime.  We made this change in the prescription and will follow up with him in 1 month for med check.

## 2020-06-20 NOTE — Telephone Encounter (Signed)
Patient has been notified that the low dose lung cancer screening CT scan is due currently or will be in near future.  Confirmed that patient is within the appropriate age range and asymptomatic, (no signs or symptoms of lung cancer).  Patient denies illness that would prevent curative treatment for lung cancer if found.  Patient is agreeable for CT scan being scheduled.    Verified smoking history (former smoker, quit 05/2017 with 60 year 1 ppd history).   CT scheduled for 06/30/20 @ 2:30

## 2020-06-21 NOTE — Addendum Note (Signed)
Addended by: Jonne Ply on: 06/21/2020 10:51 AM   Modules accepted: Orders

## 2020-06-21 NOTE — Telephone Encounter (Signed)
Smoking history: former, quit 05/2017, 41 pack year

## 2020-06-27 ENCOUNTER — Other Ambulatory Visit: Payer: Self-pay | Admitting: Nurse Practitioner

## 2020-06-30 ENCOUNTER — Ambulatory Visit
Admission: RE | Admit: 2020-06-30 | Discharge: 2020-06-30 | Disposition: A | Discharge status: Home / Self Care | Payer: Medicare Other | Source: Ambulatory Visit | Attending: Oncology | Admitting: Oncology

## 2020-06-30 ENCOUNTER — Other Ambulatory Visit: Payer: Self-pay

## 2020-06-30 DIAGNOSIS — Z122 Encounter for screening for malignant neoplasm of respiratory organs: Secondary | ICD-10-CM | POA: Diagnosis present

## 2020-06-30 DIAGNOSIS — Z87891 Personal history of nicotine dependence: Secondary | ICD-10-CM | POA: Diagnosis not present

## 2020-07-05 ENCOUNTER — Encounter: Payer: Self-pay | Admitting: *Deleted

## 2020-07-06 ENCOUNTER — Other Ambulatory Visit: Payer: Self-pay

## 2020-07-06 ENCOUNTER — Ambulatory Visit (INDEPENDENT_AMBULATORY_CARE_PROVIDER_SITE_OTHER): Payer: Medicare Other | Admitting: Orthotics

## 2020-07-06 DIAGNOSIS — I739 Peripheral vascular disease, unspecified: Secondary | ICD-10-CM

## 2020-07-06 DIAGNOSIS — E1142 Type 2 diabetes mellitus with diabetic polyneuropathy: Secondary | ICD-10-CM | POA: Diagnosis not present

## 2020-07-06 DIAGNOSIS — M722 Plantar fascial fibromatosis: Secondary | ICD-10-CM

## 2020-07-06 NOTE — Progress Notes (Signed)

## 2020-07-13 ENCOUNTER — Ambulatory Visit (INDEPENDENT_AMBULATORY_CARE_PROVIDER_SITE_OTHER): Payer: Medicare Other | Admitting: General Practice

## 2020-07-13 ENCOUNTER — Telehealth: Payer: Medicare Other | Admitting: General Practice

## 2020-07-13 DIAGNOSIS — E1140 Type 2 diabetes mellitus with diabetic neuropathy, unspecified: Secondary | ICD-10-CM

## 2020-07-13 DIAGNOSIS — E785 Hyperlipidemia, unspecified: Secondary | ICD-10-CM

## 2020-07-13 DIAGNOSIS — E1159 Type 2 diabetes mellitus with other circulatory complications: Secondary | ICD-10-CM

## 2020-07-13 DIAGNOSIS — I2510 Atherosclerotic heart disease of native coronary artery without angina pectoris: Secondary | ICD-10-CM | POA: Diagnosis not present

## 2020-07-13 DIAGNOSIS — J432 Centrilobular emphysema: Secondary | ICD-10-CM | POA: Diagnosis not present

## 2020-07-13 DIAGNOSIS — E1169 Type 2 diabetes mellitus with other specified complication: Secondary | ICD-10-CM

## 2020-07-13 DIAGNOSIS — G63 Polyneuropathy in diseases classified elsewhere: Secondary | ICD-10-CM

## 2020-07-13 DIAGNOSIS — I10 Essential (primary) hypertension: Secondary | ICD-10-CM | POA: Diagnosis not present

## 2020-07-13 NOTE — Chronic Care Management (AMB) (Signed)
Chronic Care Management   Follow Up Note   07/13/2020 Name: Kamarius Buckbee MRN: [id number] DOB: Aug 28, 1951  Referred by: Dorcas Carrow, DO Reason for referral : Chronic Care Management (RNCM Initial Outreach: Chronic Disease Management and Care Coordination Needs)   Tomaz Janis is a 69 y.o. year old male who is a primary care patient of Dorcas Carrow, DO. The CCM team was consulted for assistance with chronic disease management and care coordination needs.    Review of patient status, including review of consultants reports, relevant laboratory and other test results, and collaboration with appropriate care team members and the patient's provider was performed as part of comprehensive patient evaluation and provision of chronic care management services.    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 11/11/2019: Taking 1000 mg daily to twice daily    albuterol (VENTOLIN HFA) 108 (90 Base) MCG/ACT inhaler Inhale 2 puffs into the lungs every 6 (six) hours as needed for wheezing or shortness of breath.    amLODipine (NORVASC) 5 MG tablet Take 1 tablet (5 mg total) by mouth daily.    aspirin EC 81 MG tablet Take 1 tablet (81 mg total) by mouth daily.    atorvastatin (LIPITOR) 20 MG tablet Take 1 tablet (20 mg total) by mouth daily.    azelastine (ASTELIN) 0.1 % nasal spray Place 2 sprays into both nostrils at bedtime.     chlorthalidone (HYGROTON) 25 MG tablet Take 1 tablet (25 mg total) by mouth daily.    furosemide (LASIX) 20 MG  tablet Take 1 tablet (20 mg total) by mouth daily.    gabapentin (NEURONTIN) 300 MG capsule Take 300mg  at noon and 600mg  at bedtime    hydrocortisone (ANUSOL-HC) 2.5 % rectal cream Place 1 application rectally 2 (two) times daily.    loratadine (CLARITIN) 10 MG tablet Take 1 tablet (10 mg total) by mouth daily.    metFORMIN (GLUCOPHAGE) 1000 MG tablet Take 1 tablet (1,000 mg total) by mouth 2 (two) times daily with a meal.    metoprolol tartrate (LOPRESSOR) 25 MG tablet Take 0.5 tablets (12.5 mg total) by mouth 2 (two) times daily.    montelukast (SINGULAIR) 10 MG tablet Take 1 tablet (10 mg total) by mouth at bedtime.    Potassium Chloride ER 20 MEQ TBCR Take 2 tablets (40 meq) by mouth once daily    sildenafil (REVATIO) 20 MG tablet TAKE 1 TO 5 TABLETS AS NEEDED.    tamsulosin (FLOMAX) 0.4 MG CAPS capsule Take 1 capsule (0.4 mg total) by mouth daily.    tiZANidine (ZANAFLEX) 4 MG tablet Take 1 tablet (4 mg total) by mouth every 6 (six) hours as needed for muscle spasms.    No facility-administered encounter medications on file as of 07/13/2020.     Objective:  BP Readings from Last 3 Encounters:  05/23/20 118/70  03/28/20 122/70  03/01/20 114/72   Lab Results  Component Value Date   HGBA1C 6.7 05/23/2020    Goals Addressed  This Visit's Progress     RN-I would like to get Diabetic shoes (pt-stated)        Current Barriers:   Knowledge Deficits related to obtaining diabetic shoes   Financial Constraints.   Chronic Disease Management support and education needs related to Diabetes   Nurse Case Manager Clinical Goal(s):   Over the next 120 days, patient will work with [facility] and PCP to address needs related to obtaining Diabetic Shoes.  Interventions:   Discussed plans with patient for ongoing care management follow up and provided patient with direct contact information for care management team  Reviewed scheduled/upcoming provider appointments  including: 8/24 PCP visit  Discussed with patient he would still like to  have diabetic shoes related to patient does a lot of walking with his job. He states he has a hard time getting shoes to fit and worries about getting blisters or calluses. 07-13-2020: the patient picked up his diabetic shoes on 7/06-2020.  The patient likes his shoes. States they feel good when he is wearing them but his feet are a little sore when he takes them off. The patient has a follow up with the foot doctor on the 28th of July and will let the doctor know and see if there are any recommendations.  Evaluation of the patients current work status. 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 He wants to protect his feet. He says he does not do structured exercise but he walks a lot.    Patient Self Care Activities:   Currently UNABLE TO independently obtain diabetic shoes  Please see past updates related to this goal by clicking on the "Past Updates" button in the selected goal        RNCM: Pt-"I check my blood pressure at home too" (pt-stated)        CARE PLAN ENTRY (see longtitudinal plan of care for additional care plan information)  Current Barriers:   Chronic Disease Management support, education, and care coordination needs related to CAD, HTN, HLD, DMII, and Pulmonary Disease  Clinical Goal(s) related to CAD, HTN, HLD, DMII, and Pulmonary Disease:  Over the next 120 days, patient will:   Work with the care management team to address educational, disease management, and care coordination needs   Begin or continue self health monitoring activities as directed today Measure and record cbg (blood glucose) 1 times daily, Measure and record blood pressure 2/3 times per week, and adhere to a heart healthy/ADA diet  Call provider office for new or worsened signs and symptoms Blood glucose findings outside established  parameters, Blood pressure findings outside established parameters, Oxygen saturation lower than established parameter, Chest pain, Shortness of breath, and New or worsened symptom related to emphysema, HLD, and other chronic conditions  Call care management team with questions or concerns  Verbalize basic understanding of patient centered plan of care established today  Interventions related to CAD, HTN, HLD, DMII, and Pulmonary Disease:   Evaluation of current treatment plans and patient's adherence to plan as established by provider.  Has a good understanding of his chronic conditions. Had CABG 3 years ago on 7-2.   Assessed patient understanding of disease states.  Is compliant with the treatment regimen, taking medications, and monitoring for changes in his health.   Assessed patient's education and care coordination needs. Care guide referral for resources in the community to help with installation  of grab bars/rails in his bathroom. The patient wants these to prevent falls or accidents.   Provided disease specific education to patient. Review and education on heart healthy/ADA diet. The patient states he tries to watch what he eats.  Evaluation of blood sugar readings. The patient states sometimes they are in the 90's but mostly in 110's to 120's.  Denies any hypoglycemic episodes. Discussed s/s of hyperglycemia and hypoglycemia.   Evaluation of blood pressure checks. The patient verbalized that he does check his blood pressures and records. The patient denies any concerns with his blood pressure at this time.   Collaborated with appropriate clinical care team members regarding patient needs.  The patient has worked with the CCM pharmacist in the past. Has no needs at this time. The patient denies needs from the LCSW. Knows team available for assistance.   Patient Self Care Activities related to CAD, HTN, HLD, DMII, and Pulmonary Disease:   Patient is unable to independently  self-manage chronic health conditions  Initial goal documentation         Plan:   The care management team will reach out to the patient again over the next 60 to 90 days.    Alto Denver RN, MSN, CCM Community Care Coordinator [place]   [facility] Mobile: [phone]

## 2020-07-13 NOTE — Patient Instructions (Signed)
Visit Information  Goals Addressed              This Visit's Progress   .  RN-I would like to get Diabetic shoes (pt-stated)        Current Barriers:  [name] Knowledge Deficits related to obtaining diabetic shoes  . Corporate treasurer.  . Chronic Disease Management support and education needs related to Diabetes   Nurse Case Manager Clinical Goal(s):  [name] Over the next 120 days, patient will work with [facility] and PCP to address needs related to obtaining Diabetic Shoes.  Interventions:  . Discussed plans with patient for ongoing care management follow up and provided patient with direct contact information for care management team . Reviewed scheduled/upcoming provider appointments including: 8/24 PCP visit . Discussed with patient he would still like to  have diabetic shoes related to patient does a lot of walking with his job. He states he has a hard time getting shoes to fit and worries about getting blisters or calluses. 07-13-2020: the patient picked up his diabetic shoes on 7/06-2020.  The patient likes his shoes. States they feel good when he is wearing them but his feet are a little sore when he takes them off. The patient has a follow up with the foot doctor on the 28th of July and will let the doctor know and see if there are any recommendations. . Evaluation of the patients current work status. 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 He wants to protect his feet. He says he does not do structured exercise but he walks a lot.    Patient Self Care Activities:  . Currently UNABLE TO independently obtain diabetic shoes  Please see past updates related to this goal by clicking on the "Past Updates" button in the selected goal      .  RNCM: Pt-"I check my blood pressure at home too" (pt-stated)        CARE PLAN ENTRY (see longtitudinal plan of care for additional care plan information)  Current  Barriers:  . Chronic Disease Management support, education, and care coordination needs related to CAD, HTN, HLD, DMII, and Pulmonary Disease  Clinical Goal(s) related to CAD, HTN, HLD, DMII, and Pulmonary Disease:  Over the next 120 days, patient will:  . Work with the care management team to address educational, disease management, and care coordination needs  . Begin or continue self health monitoring activities as directed today Measure and record cbg (blood glucose) 1 times daily, Measure and record blood pressure 2/3 times per week, and adhere to a heart healthy/ADA diet . Call provider office for new or worsened signs and symptoms Blood glucose findings outside established parameters, Blood pressure findings outside established parameters, Oxygen saturation lower than established parameter, Chest pain, Shortness of breath, and New or worsened symptom related to emphysema, HLD, and other chronic conditions . Call care management team with questions or concerns . Verbalize basic understanding of patient centered plan of care established today  Interventions related to CAD, HTN, HLD, DMII, and Pulmonary Disease:  . Evaluation of current treatment plans and patient's adherence to plan as established by provider.  Has a good understanding of his chronic conditions. Had CABG 3 years ago on 7-2.  . Assessed patient understanding of disease states.  Is compliant with the treatment regimen, taking medications, and monitoring for changes in his health.  . Assessed patient's  education and care coordination needs. Care guide referral for resources in the community to help with installation of grab bars/rails in his bathroom. The patient wants these to prevent falls or accidents.  . Provided disease specific education to patient. Review and education on heart healthy/ADA diet. The patient states he tries to watch what he eats. . Evaluation of blood sugar readings. The patient states sometimes they are in  the 90's but mostly in 110's to 120's.  Denies any hypoglycemic episodes. Discussed s/s of hyperglycemia and hypoglycemia.  . Evaluation of blood pressure checks. The patient verbalized that he does check his blood pressures and records. The patient denies any concerns with his blood pressure at this time.  Steele Sizer with appropriate clinical care team members regarding patient needs.  The patient has worked with the CCM pharmacist in the past. Has no needs at this time. The patient denies needs from the LCSW. Knows team available for assistance.   Patient Self Care Activities related to CAD, HTN, HLD, DMII, and Pulmonary Disease:  . Patient is unable to independently self-manage chronic health conditions  Initial goal documentation        Patient verbalizes understanding of instructions provided today.   The care management team will reach out to the patient again over the next 60 to 90 days.   Alto Denver RN, MSN, CCM Community Care Coordinator [place]  [facility] Mobile: [phone]

## 2020-07-27 ENCOUNTER — Ambulatory Visit (INDEPENDENT_AMBULATORY_CARE_PROVIDER_SITE_OTHER): Payer: Medicare Other | Admitting: Podiatry

## 2020-07-27 ENCOUNTER — Encounter: Payer: Self-pay | Admitting: Podiatry

## 2020-07-27 ENCOUNTER — Other Ambulatory Visit: Payer: Self-pay

## 2020-07-27 DIAGNOSIS — E1142 Type 2 diabetes mellitus with diabetic polyneuropathy: Secondary | ICD-10-CM

## 2020-07-27 DIAGNOSIS — I2510 Atherosclerotic heart disease of native coronary artery without angina pectoris: Secondary | ICD-10-CM

## 2020-07-27 DIAGNOSIS — I739 Peripheral vascular disease, unspecified: Secondary | ICD-10-CM | POA: Diagnosis not present

## 2020-07-27 MED ORDER — GABAPENTIN 300 MG PO CAPS
ORAL_CAPSULE | ORAL | 3 refills | Status: DC
Start: 2020-07-27 — End: 2021-08-11

## 2020-07-27 NOTE — Progress Notes (Signed)
He presents today for follow-up of his gabapentin. He states that is doing much better the medication is really help since we have increased it. He states that he really likes his diabetic shoes that he still gets a little tenderness across the top of the feet occasionally.  Objective: Vital signs are stable alert oriented x3 after reviewing records it it indicates that he is taking gabapentin 300 mg in the morning and 600 mg at night.  Pulses remain palpable no change in physical exam.  Assessment: Diabetic shoes appear to be doing well. Pain is under control with gabapentin.  Plan: Continue to use of 300 in the morning 600 in the evening of gabapentin. I will follow-up with him as needed or in 6 months.

## 2020-08-02 DIAGNOSIS — H2513 Age-related nuclear cataract, bilateral: Secondary | ICD-10-CM | POA: Diagnosis not present

## 2020-08-02 DIAGNOSIS — H35033 Hypertensive retinopathy, bilateral: Secondary | ICD-10-CM | POA: Diagnosis not present

## 2020-08-02 DIAGNOSIS — E119 Type 2 diabetes mellitus without complications: Secondary | ICD-10-CM | POA: Diagnosis not present

## 2020-08-02 LAB — HM DIABETES EYE EXAM

## 2020-08-23 ENCOUNTER — Encounter: Payer: Self-pay | Admitting: Nurse Practitioner

## 2020-08-23 ENCOUNTER — Other Ambulatory Visit: Payer: Self-pay

## 2020-08-23 ENCOUNTER — Ambulatory Visit (INDEPENDENT_AMBULATORY_CARE_PROVIDER_SITE_OTHER): Payer: Medicare Other | Admitting: Nurse Practitioner

## 2020-08-23 VITALS — BP 128/79 | HR 63 | Temp 98.3°F | Wt 204.0 lb

## 2020-08-23 DIAGNOSIS — J432 Centrilobular emphysema: Secondary | ICD-10-CM | POA: Diagnosis not present

## 2020-08-23 DIAGNOSIS — I700 Atherosclerosis of aorta: Secondary | ICD-10-CM

## 2020-08-23 DIAGNOSIS — I10 Essential (primary) hypertension: Secondary | ICD-10-CM

## 2020-08-23 DIAGNOSIS — I152 Hypertension secondary to endocrine disorders: Secondary | ICD-10-CM

## 2020-08-23 DIAGNOSIS — E785 Hyperlipidemia, unspecified: Secondary | ICD-10-CM | POA: Diagnosis not present

## 2020-08-23 DIAGNOSIS — E1169 Type 2 diabetes mellitus with other specified complication: Secondary | ICD-10-CM | POA: Diagnosis not present

## 2020-08-23 DIAGNOSIS — Z6825 Body mass index (BMI) 25.0-25.9, adult: Secondary | ICD-10-CM | POA: Diagnosis not present

## 2020-08-23 DIAGNOSIS — E1140 Type 2 diabetes mellitus with diabetic neuropathy, unspecified: Secondary | ICD-10-CM | POA: Diagnosis not present

## 2020-08-23 DIAGNOSIS — I2510 Atherosclerotic heart disease of native coronary artery without angina pectoris: Secondary | ICD-10-CM

## 2020-08-23 DIAGNOSIS — K760 Fatty (change of) liver, not elsewhere classified: Secondary | ICD-10-CM

## 2020-08-23 DIAGNOSIS — Z87891 Personal history of nicotine dependence: Secondary | ICD-10-CM | POA: Diagnosis not present

## 2020-08-23 DIAGNOSIS — E1159 Type 2 diabetes mellitus with other circulatory complications: Secondary | ICD-10-CM | POA: Diagnosis not present

## 2020-08-23 LAB — BAYER DCA HB A1C WAIVED: HB A1C (BAYER DCA - WAIVED): 6.5 % (ref <7.0)

## 2020-08-23 NOTE — Assessment & Plan Note (Signed)
Recommended eating smaller high protein, low fat meals more frequently and exercising 30 mins a day 5 times a week with a goal of 10-15lb weight loss in the next 3 months. Patient voiced their understanding and motivation to adhere to these recommendations.  

## 2020-08-23 NOTE — Assessment & Plan Note (Signed)
Chronic, stable with minimal use of Albuterol.  PFT recently noting minimal change from previous with FEV1 62% and FEV1/FVC 72%.  Continue to monitor and adjust regimen as needed.  Continue Albuterol and recommend he use this if wheezing present or SOB.  Continue cessation of smoking and continue yearly lung screening.

## 2020-08-23 NOTE — Assessment & Plan Note (Signed)
Continue ASA and Lipitor daily for prevention.  Quit smoking 2 years ago.

## 2020-08-23 NOTE — Patient Instructions (Signed)

## 2020-08-23 NOTE — Assessment & Plan Note (Addendum)
Chronic, ongoing with BP at goal today and at goal on home readings.  Continue current medication regimen and adjust as needed.  Continue collaboration with cardiology.  Recommend he monitor BP at least a few times a week at home and document.  DASH diet focus.  BM and TSH  today.  Return in 3 months.

## 2020-08-23 NOTE — Progress Notes (Signed)
BP 128/79   Pulse 63   Temp 98.3 F (36.8 C) (Oral)   Wt 204 lb (92.5 kg)   SpO2 95%   BMI 25.50 kg/m    Subjective:    Patient ID: Brendan [name], male    DOB: 1951-04-23, 69 y.o.   MRN: [id number]  HPI: Brendan [name] is a 69 y.o. male  Chief Complaint  Patient presents with  . Diabetes  . Hypertension  . Hyperlipidemia  . COPD   DIABETES Last A1C 6.7%.  Continues on Metformin 1000 MG BID.  Was started on Gabapentin 300 MG QHS on 05/09/20 by podiatry for neuropathy.   Hypoglycemic episodes:no Polydipsia/polyuria: no Visual disturbance: no Chest pain: no Paresthesias: no Glucose Monitoring: yes  Accucheck frequency: Daily  Fasting glucose: 110 to 120 range -- x one level of 88  Post prandial:   Evening:  Before meals: Taking Insulin?: no  Long acting insulin:  Short acting insulin: Blood Pressure Monitoring: not checking Retinal Examination: Up To Date -- going Thursday for cataract surgery Foot Exam: Up to Date Pneumovax: Up to Date Influenza: Up to Date Aspirin: yes   HYPERTENSION / HYPERLIPIDEMIA Continues on Metoprolol, Lasix, Hygroton, Amlodipine, K+, and Lipitor. Followed by cardiology and last seen 03/01/2020 by PA Dunn.   Allergic to Lisinopril. Aortic atherosclerosis noted on recent lung CT screening.  Last echo showed EF 60-65% in 2021. Had repeat testing wall motion testing on 05/02/20. Satisfied with current treatment? yes Duration of hypertension: chronic BP monitoring frequency: a few days a week BP range: 110-120/70's range BP medication side effects: no Duration of hyperlipidemia: chronic Cholesterol medication side effects: no Cholesterol supplements: none Medication compliance: good compliance Aspirin: yes Recent stressors: no Recurrent headaches: no Visual changes: no Palpitations: no Dyspnea: no Chest pain: no Lower extremity edema: no Dizzy/lightheaded: no   COPD Has not smoked in 3 years since his bypass surgery.  Was Covid positive  12/30/2019, recovered well and no further symptoms. FEV1 62% and FEV1/FVC 72% last visit.  Has Singulair he takes daily.  Has Albuterol inhaler, but does not use.  Last Lung CT screening 07/01/20 -- noted aortic atherosclerosis which was noted on past imaging and has been discussed with patient + emphysema and hepatic steatosis.   COPD status: stable Satisfied with current treatment?: yes Oxygen use: no Dyspnea frequency: none Cough frequency: none Rescue inhaler frequency: none Limitation of activity: no Productive cough: none Last Spirometry: today Pneumovax: Up to Date Influenza: Up to Date   Relevant past medical, surgical, family and social history reviewed and updated as indicated. Interim medical history since our last visit reviewed. Allergies and medications reviewed and updated.  Review of Systems  Constitutional: Negative for activity change, diaphoresis, fatigue and fever.  Respiratory: Negative for cough, chest tightness, shortness of breath and wheezing.   Cardiovascular: Negative for chest pain, palpitations and leg swelling.  Gastrointestinal: Negative for abdominal distention, abdominal pain, constipation, diarrhea, nausea and vomiting.  Endocrine: Negative for cold intolerance, heat intolerance, polydipsia, polyphagia and polyuria.  Neurological: Negative for dizziness, syncope, weakness, light-headedness, numbness and headaches.  Psychiatric/Behavioral: Negative.     Per HPI unless specifically indicated above     Objective:    BP 128/79   Pulse 63   Temp 98.3 F (36.8 C) (Oral)   Wt 204 lb (92.5 kg)   SpO2 95%   BMI 25.50 kg/m   Wt Readings from Last 3 Encounters:  08/23/20 204 lb (92.5 kg)  06/30/20 203 lb (92.1 kg)  05/23/20 201 lb 9.6 oz (91.4 kg)    Physical Exam Vitals and nursing note reviewed.  Constitutional:      General: He is awake. He is not in acute distress.    Appearance: He is well-developed and overweight. He is not ill-appearing.   HENT:     Head: Normocephalic and atraumatic.     Right Ear: Hearing normal. No drainage.     Left Ear: Hearing normal. No drainage.  Eyes:     General: Lids are normal.        Right eye: No discharge.        Left eye: No discharge.     Conjunctiva/sclera: Conjunctivae normal.     Pupils: Pupils are equal, round, and reactive to light.  Neck:     Vascular: No carotid bruit.  Cardiovascular:     Rate and Rhythm: Normal rate and regular rhythm.     Heart sounds: Normal heart sounds, S1 normal and S2 normal. No murmur. No gallop.   Pulmonary:     Effort: Pulmonary effort is normal. No accessory muscle usage or respiratory distress.     Breath sounds: Normal breath sounds.  Abdominal:     General: Bowel sounds are normal.     Palpations: Abdomen is soft.     Tenderness: There is no abdominal tenderness.   Musculoskeletal:        General: Normal range of motion.     Cervical back: Normal range of motion and neck supple.     Right lower leg: No edema.     Left lower leg: No edema.  Skin:    General: Skin is warm and dry.  Neurological:     Mental Status: He is alert and oriented to person, place, and time.  Psychiatric:        Attention and Perception: Attention normal.        Mood and Affect: Mood normal.        Speech: Speech normal.        Behavior: Behavior normal. Behavior is cooperative.     Results for orders placed or performed in visit on 08/03/20  HM DIABETES EYE EXAM  Result Value Ref Range   HM Diabetic Eye Exam No Retinopathy No Retinopathy      Assessment & Plan:   Problem List Items Addressed This Visit      Cardiovascular and Mediastinum   Hypertension associated with diabetes (HCC)    Chronic, ongoing with BP at goal today and at goal on home readings.  Continue current medication regimen and adjust as needed.  Continue collaboration with cardiology.  Recommend he monitor BP at least a few times a week at home and document.  DASH diet focus.  BM and  TSH  today.  Return in 3 months.      Relevant Orders   Bayer DCA Hb A1c Waived   Basic metabolic panel   TSH   Aortic atherosclerosis (HCC)    Continue ASA and Lipitor daily for prevention.  Quit smoking 2 years ago.          Respiratory   Centrilobular emphysema (HCC)    Chronic, stable with minimal use of Albuterol.  PFT recently noting minimal change from previous with FEV1 62% and FEV1/FVC 72%.  Continue to monitor and adjust regimen as needed.  Continue Albuterol and recommend he use this if wheezing present or SOB.  Continue cessation of smoking and continue yearly lung screening.  Digestive   Hepatic steatosis    Noted on lung screening July 2021 -- recommend focus on healthy diet and will continue to monitor.        Endocrine   Controlled type 2 diabetes with neuropathy (HCC) - Primary    Chronic, ongoing.  A1C 6.5%, decrease from previous. Urine ALB 10 and A:C <30 last visit. Continue current medication regimen and checking BS at home daily.  Discussed addition of Jardiance or Farxiga, which would offer heart benefit, but he wishes to hold off at this time.  Return in 3 months for follow-up, if elevation A1C add on SGLT2.  Continue Gabapentin as prescribed by podiatry and adjust as needed.  Return in 3 months.      Relevant Orders   Bayer DCA Hb A1c Waived   Hyperlipidemia associated with type 2 diabetes mellitus (HCC)    Chronic, ongoing.  Continue current medication regimen and adjust as needed.  Lipid panel today.  Recent LDL 66.      Relevant Orders   Bayer DCA Hb A1c Waived   Lipid Panel w/o Chol/HDL Ratio     Other   Personal history of tobacco use, presenting hazards to health    Continue annual lung screening.  Recommend continued cessation.      BMI 25.0-25.9,adult    Recommended eating smaller high protein, low fat meals more frequently and exercising 30 mins a day 5 times a week with a goal of 10-15lb weight loss in the next 3 months. Patient  voiced their understanding and motivation to adhere to these recommendations.           Follow up plan: Return in about 3 months (around 11/23/2020) for T2DM, HTN/HLD, BPH -- needs PSA.

## 2020-08-23 NOTE — Assessment & Plan Note (Signed)
Chronic, ongoing.  A1C 6.5%, decrease from previous. Urine ALB 10 and A:C <30 last visit. Continue current medication regimen and checking BS at home daily.  Discussed addition of Jardiance or Farxiga, which would offer heart benefit, but he wishes to hold off at this time.  Return in 3 months for follow-up, if elevation A1C add on SGLT2.  Continue Gabapentin as prescribed by podiatry and adjust as needed.  Return in 3 months.

## 2020-08-23 NOTE — Assessment & Plan Note (Signed)
Chronic, ongoing.  Continue current medication regimen and adjust as needed.  Lipid panel today.  Recent LDL 66.

## 2020-08-23 NOTE — Assessment & Plan Note (Signed)
Noted on lung screening July 2021 -- recommend focus on healthy diet and will continue to monitor.

## 2020-08-23 NOTE — Assessment & Plan Note (Signed)
Continue annual lung screening.  Recommend continued cessation. ?

## 2020-08-24 LAB — BASIC METABOLIC PANEL
BUN/Creatinine Ratio: 14 (ref 10–24)
BUN: 17 mg/dL (ref 8–27)
CO2: 26 mmol/L (ref 20–29)
Calcium: 9.4 mg/dL (ref 8.6–10.2)
Chloride: 103 mmol/L (ref 96–106)
Creatinine, Ser: 1.2 mg/dL (ref 0.76–1.27)
GFR calc Af Amer: 71 mL/min/{1.73_m2} (ref >59)
GFR calc non Af Amer: 61 mL/min/{1.73_m2} (ref >59)
Glucose: 153 mg/dL — ABNORMAL HIGH (ref 65–99)
Potassium: 4 mmol/L (ref 3.5–5.2)
Sodium: 143 mmol/L (ref 134–144)

## 2020-08-24 LAB — LIPID PANEL W/O CHOL/HDL RATIO
Cholesterol, Total: 103 mg/dL (ref 100–199)
HDL: 29 mg/dL — ABNORMAL LOW (ref >39)
LDL Chol Calc (NIH): 46 mg/dL (ref 0–99)
Triglycerides: 165 mg/dL — ABNORMAL HIGH (ref 0–149)
VLDL Cholesterol Cal: 28 mg/dL (ref 5–40)

## 2020-08-24 LAB — TSH: TSH: 1.24 u[IU]/mL (ref 0.450–4.500)

## 2020-08-24 NOTE — Progress Notes (Signed)
Please let Vitor know his kidney function and cholesterol levels continue to look good on labs, will continue to monitor.  Have a wonderful day!!

## 2020-08-25 DIAGNOSIS — H2512 Age-related nuclear cataract, left eye: Secondary | ICD-10-CM | POA: Diagnosis not present

## 2020-08-25 DIAGNOSIS — H25013 Cortical age-related cataract, bilateral: Secondary | ICD-10-CM | POA: Diagnosis not present

## 2020-08-25 DIAGNOSIS — E119 Type 2 diabetes mellitus without complications: Secondary | ICD-10-CM | POA: Diagnosis not present

## 2020-08-25 DIAGNOSIS — H2513 Age-related nuclear cataract, bilateral: Secondary | ICD-10-CM | POA: Diagnosis not present

## 2020-08-25 DIAGNOSIS — H35033 Hypertensive retinopathy, bilateral: Secondary | ICD-10-CM | POA: Diagnosis not present

## 2020-08-25 LAB — HM DIABETES EYE EXAM

## 2020-09-01 ENCOUNTER — Telehealth: Payer: Self-pay | Admitting: Nurse Practitioner

## 2020-09-01 NOTE — Telephone Encounter (Signed)
   SF 09/01/2020   Name: Brendan [name]   MRN: [id number]   DOB: 01-Jul-1951   AGE: 69 y.o.   GENDER: male   PCP Marjie Skiff, NP.   Called pt regarding [employer] Referral for assistance with home safety.Patient did not answer; could not leave message no voicemail set up.  Patient is looking for someone who can install bars and/or rails in his shower. Will refer patient to [facility].   Follow up on: 09/02/2020  [facility] Care Guide, Embedded Care Coordination [facility], Care Management Phone: [phone] Email: [email or web address]

## 2020-09-07 ENCOUNTER — Ambulatory Visit (INDEPENDENT_AMBULATORY_CARE_PROVIDER_SITE_OTHER): Payer: Medicare Other | Admitting: Internal Medicine

## 2020-09-07 ENCOUNTER — Other Ambulatory Visit: Payer: Self-pay

## 2020-09-07 ENCOUNTER — Encounter: Payer: Self-pay | Admitting: Internal Medicine

## 2020-09-07 VITALS — BP 110/70 | HR 66 | Ht 75.0 in | Wt 202.5 lb

## 2020-09-07 DIAGNOSIS — E785 Hyperlipidemia, unspecified: Secondary | ICD-10-CM

## 2020-09-07 DIAGNOSIS — I10 Essential (primary) hypertension: Secondary | ICD-10-CM

## 2020-09-07 DIAGNOSIS — I5022 Chronic systolic (congestive) heart failure: Secondary | ICD-10-CM

## 2020-09-07 DIAGNOSIS — I5032 Chronic diastolic (congestive) heart failure: Secondary | ICD-10-CM | POA: Insufficient documentation

## 2020-09-07 DIAGNOSIS — I77810 Thoracic aortic ectasia: Secondary | ICD-10-CM | POA: Diagnosis not present

## 2020-09-07 DIAGNOSIS — I2510 Atherosclerotic heart disease of native coronary artery without angina pectoris: Secondary | ICD-10-CM | POA: Diagnosis not present

## 2020-09-07 MED ORDER — FUROSEMIDE 20 MG PO TABS: 20.0000 mg | ORAL_TABLET | Freq: Every day | ORAL | 3 refills | Status: DC | PRN

## 2020-09-07 NOTE — Progress Notes (Signed)
Follow-up Outpatient Visit Date: 09/07/2020  Primary Care Provider: Marjie Skiff, NP [address] [place] [address]  Chief Complaint: Follow-up coronary artery disease and shortness of breath  HPI:  Brendan [name] is a 69 y.o. male with history of CAD status post four-vessel CABG in 06/2017 with LIMA to LAD, SVG to D2, SVG to OM, SVG to PDA, postoperative A. 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  He was last seen in our office in March by Eula Listen, PA, at which time he was doing well.  He noted occasional episodes of random shortness of breath that predate his COVID-19 infection in December.  In light of this, he was referred for echocardiogram, which showed normal LV systolic and diastolic function.  RV contraction was mildly reduced.  No significant valvular disease was identified.  Aortic root was mildly dilated.  Subsequent myocardial perfusion stress test was normal.  Today, Brendan [name] reports that he is doing fairly well.  He still has some exertional dyspnea that has been present for years and is unchanged from his baseline.  He denies chest pain, palpitations, and edema.  He has some occasional orthostatic lightheadedness, especially when he has been bending over and stands up quickly.  He has not fallen or passed out.  He is tolerating his medications well.  --------------------------------------------------------------------------------------------------  Past Medical History:  Diagnosis Date  . CAD (coronary artery disease)    a. 05/2016 Lexi MV: EF 58%, no ischemia/infarct; b. 05/2017 Cath: LM 20-30, LAD 50p, 18m, D1 50p, D2 80ost, LCX 90/70ost, RCA 80/90p/m, RPL1 50p, EF 50-55%; c. 06/2017 CABG x 4 (LIMA->LAD, VG->D2,VG->OM, VG->PDA).  . Diabetes mellitus without complication (HCC)   . Diastolic dysfunction    a. 05/2016 Echo: EF 55%, Gr1 DD, mild TR/MR.   [name] Hyperlipidemia   . Hypertension   . Hypokalemia   . Personal history of tobacco use, presenting hazards to health 05/03/2016  . Post-op Afib    a. 06/2017 following CABG->short course of amio.   Past Surgical History:  Procedure Laterality Date  . BACK SURGERY    . CARDIAC CATHETERIZATION    . CORONARY ARTERY BYPASS GRAFT N/A 07/01/2017   Procedure: CORONARY ARTERY BYPASS GRAFTING (CABG) x4, using left mammary artery and right greater saphenous vein harvested endoscopically;  Surgeon: Kerin Perna, MD;  Location: [facility] OR;  Service: Open Heart Surgery;  Laterality: N/A;  . KNEE SURGERY    . LEFT HEART CATH AND CORONARY ANGIOGRAPHY N/A 06/04/2017   Procedure: Left Heart Cath and Coronary Angiography;  Surgeon: Yvonne Kendall, MD;  Location: [facility] INVASIVE CV LAB;  Service: Cardiovascular;  Laterality: N/A;  . TEE WITHOUT CARDIOVERSION N/A 07/01/2017   Procedure: TRANSESOPHAGEAL ECHOCARDIOGRAM (TEE);  Surgeon: Donata Clay, Theron Arista, MD;  Location: [facility] OR;  Service: Open Heart Surgery;  Laterality: N/A;    Current Meds  Medication Sig  . acetaminophen (TYLENOL) 500 MG tablet Take 1 tablet (500 mg total) by mouth every 6 (six) hours as needed for mild pain or headache.  . albuterol (VENTOLIN HFA) 108 (90 Base) MCG/ACT inhaler Inhale 2 puffs into the lungs every 6 (six) hours as needed for wheezing or shortness of breath.  [name] amLODipine (NORVASC) 5 MG tablet Take 1 tablet (5 mg total) by mouth daily.  [name] aspirin EC 81 MG tablet Take 1 tablet (81 mg total) by mouth daily.  [name]  atorvastatin (LIPITOR) 20 MG tablet Take 1 tablet (20 mg total) by mouth daily.  [name] azelastine (ASTELIN) 0.1 % nasal spray Place 2 sprays into both nostrils at bedtime.   . chlorthalidone (HYGROTON) 25 MG tablet Take 1 tablet (25 mg total) by mouth daily.  . furosemide (LASIX) 20 MG tablet Take 1 tablet (20 mg total) by mouth daily.  [name] gabapentin (NEURONTIN) 300 MG capsule Take one capsule in the morning, two capsules at bedtime  .  hydrocortisone (ANUSOL-HC) 2.5 % rectal cream Place 1 application rectally 2 (two) times daily.  [name] loratadine (CLARITIN) 10 MG tablet Take 1 tablet (10 mg total) by mouth daily.  . metFORMIN (GLUCOPHAGE) 1000 MG tablet Take 1 tablet (1,000 mg total) by mouth 2 (two) times daily with a meal.  . metoprolol tartrate (LOPRESSOR) 25 MG tablet Take 0.5 tablets (12.5 mg total) by mouth 2 (two) times daily.  . montelukast (SINGULAIR) 10 MG tablet Take 1 tablet (10 mg total) by mouth at bedtime.  . Potassium Chloride ER 20 MEQ TBCR Take 2 tablets (40 meq) by mouth once daily  . sildenafil (REVATIO) 20 MG tablet TAKE 1 TO 5 TABLETS AS NEEDED.  [name] tamsulosin (FLOMAX) 0.4 MG CAPS capsule Take 1 capsule (0.4 mg total) by mouth daily.  [name] tiZANidine (ZANAFLEX) 4 MG tablet Take 1 tablet (4 mg total) by mouth every 6 (six) hours as needed for muscle spasms.    Allergies: Lisinopril  Social History   Tobacco Use  . Smoking status: Former Smoker    Packs/day: 1.00    Years: 60.00    Pack years: 60.00    Types: Cigarettes    Quit date: 06/29/2017    Years since quitting: 3.1  . Smokeless tobacco: Never Used  Vaping Use  . Vaping Use: Never used  Substance Use Topics  . Alcohol use: No    Comment: previously drank heavily but quit ~ 21 yrs ago.  . Drug use: No    Family History  Problem Relation Age of Onset  . Hypertension Mother   . Diabetes Mother   . Heart disease Father 20       multiple MI's    Review of Systems: A 12-system review of systems was performed and was negative except as noted in the HPI.  --------------------------------------------------------------------------------------------------  Physical Exam: BP 110/70 (BP Location: Left Arm, Patient Position: Sitting, Cuff Size: Normal)   Pulse 66   Ht 6\' 3"  (1.905 m)   Wt 202 lb 8 oz (91.9 kg)   SpO2 92%   BMI 25.31 kg/m   General: NAD. HEENT: No conjunctival pallor or scleral icterus. Facemask in place. Neck: Supple  without lymphadenopathy, thyromegaly, JVD, or HJR. Lungs: Normal work of breathing. Clear to auscultation bilaterally without wheezes or crackles. Heart: Regular rate and rhythm without murmurs, rubs, or gallops. Abd: Bowel sounds present. Soft, NT/ND without hepatosplenomegaly Ext: No lower extremity edema. Skin: Warm and dry without rash.  EKG: Normal sinus rhythm with PACs.  Lab Results  Component Value Date   WBC 7.4 11/24/2018   HGB 16.3 11/24/2018   HCT 47.9 11/24/2018   MCV 85 11/24/2018   PLT 219 11/24/2018    Lab Results  Component Value Date   NA 143 08/23/2020   K 4.0 08/23/2020   CL 103 08/23/2020   CO2 26 08/23/2020   BUN 17 08/23/2020   CREATININE 1.20 08/23/2020   GLUCOSE 153 (H) 08/23/2020   ALT 25 02/24/2020    Lab Results  Component Value Date   CHOL 103 08/23/2020   HDL 29 (L) 08/23/2020   LDLCALC 46 08/23/2020   TRIG 165 (H) 08/23/2020   CHOLHDL 3.5 08/21/2018    --------------------------------------------------------------------------------------------------  ASSESSMENT AND PLAN: Coronary artery disease: No angina reported.  Recent myocardial perfusion stress test was reassuring without evidence of ischemia or scar.  Continue current medications for secondary prevention.  Chronic HFpEF: Brendan [name] appears euvolemic on exam.  Recent echo showed normal LVEF with mild RV dysfunction.  Given that his volume status has been stable, I have suggested that we switch furosemide from 20 mg daily to 20 mg daily as needed.  We will continue chlorthalidone as well as the remainder of his antihypertensive regimen.  Hypertension: Blood pressure well controlled today.  As above, we will switch furosemide to as needed.  No other medication changes today.  Hyperlipidemia: LDL at goal.  Continue atorvastatin 20 mg daily.  Dilated aortic root: Incidentally noted on recent echo.  No significant thoracic aortic aneurysm identified on recent unenhanced CT of the  chest.  Continue medical therapy to prevent progression, including blood pressure and lipid control.  Follow-up: Return to clinic in 6 months.  Yvonne Kendall, MD 09/07/2020 1:31 PM

## 2020-09-07 NOTE — Patient Instructions (Signed)
Medication Instructions:  Your physician has recommended you make the following change in your medication:  1- CHANGE Furosemide to 20 mg by mouth once a day AS NEEDED for swelling and/or weight gain.   *If you need a refill on your cardiac medications before your next appointment, please call your pharmacy*  Follow-Up: At [facility], you and your health needs are our priority.  As part of our continuing mission to provide you with exceptional heart care, we have created designated Provider Care Teams.  These Care Teams include your primary Cardiologist (physician) and Advanced Practice Providers (APPs -  Physician Assistants and Nurse Practitioners) who all work together to provide you with the care you need, when you need it.  We recommend signing up for the patient portal called "MyChart".  Sign up information is provided on this After Visit Summary.  MyChart is used to connect with patients for Virtual Visits (Telemedicine).  Patients are able to view lab/test results, encounter notes, upcoming appointments, etc.  Non-urgent messages can be sent to your provider as well.   To learn more about what you can do with MyChart, go to [email or web address].    Your next appointment:   6 month(s)  The format for your next appointment:   In Person  Provider:    You may see Yvonne Kendall, MD or one of the following Advanced Practice Providers on your designated Care Team:    Nicolasa Ducking, NP  Eula Listen, PA-C  Marisue Ivan, PA-C

## 2020-09-07 NOTE — Telephone Encounter (Signed)
   SF 09/07/2020 2nd Attempt  Name: Brendan [name]   MRN: [id number]   DOB: [birth year]   AGE: 69 y.o.   GENDER: male   PCP Marjie Skiff, NP.   Called pt regarding [employer] Referral for home safety. Patient is in need of grab bars and rails for his shower. Patient did not answer. Could not leave voicemail due to no voicemail set up. Will try to contact patient again.   Follow up on: 09/08/2018  [facility] Care Guide, Embedded Care Coordination [facility], Care Management Phone: [phone] Email: [email or web address]

## 2020-09-08 ENCOUNTER — Other Ambulatory Visit: Payer: Self-pay | Admitting: Nurse Practitioner

## 2020-09-08 NOTE — Telephone Encounter (Signed)
   SF 09/08/2020 3rd Attempt  Name: Brendan [name]   MRN: [id number]   DOB: 03-20-1951   AGE: 69 y.o.   GENDER: male   PCP Marjie Skiff, NP.   Called pt regarding [employer] Referral for home safety. Patient did not answer. Could not leave a message due to voicemail not being set up. Referral will be closed for unable to contact.   Closing referral pending any other needs of patient.    [facility] Care Guide, Embedded Care Coordination [facility], Care Management Phone: [phone] Email: [email or web address]

## 2020-09-08 NOTE — Telephone Encounter (Signed)
Requested medication (s) are due for refill today: no  Requested medication (s) are on the active medication list: yes   Last refill: 06/06/2020  Future visit scheduled: yes   Notes to clinic: filled by different provider  Review for refill   Requested Prescriptions  Pending Prescriptions Disp Refills   potassium chloride SA (KLOR-CON) 20 MEQ tablet [Pharmacy Med Name: POTASSIUM CL ER 20 MEQ TABLET] 180 tablet 0    Sig: Take 2 tablets (40 meq) by mouth once daily      There is no refill protocol information for this order

## 2020-09-08 NOTE — Telephone Encounter (Signed)
Routing to provider  

## 2020-09-13 DIAGNOSIS — H25013 Cortical age-related cataract, bilateral: Secondary | ICD-10-CM | POA: Diagnosis not present

## 2020-09-13 DIAGNOSIS — H2512 Age-related nuclear cataract, left eye: Secondary | ICD-10-CM | POA: Diagnosis not present

## 2020-09-13 DIAGNOSIS — H2513 Age-related nuclear cataract, bilateral: Secondary | ICD-10-CM | POA: Diagnosis not present

## 2020-09-13 DIAGNOSIS — H25812 Combined forms of age-related cataract, left eye: Secondary | ICD-10-CM | POA: Diagnosis not present

## 2020-09-21 ENCOUNTER — Telehealth: Payer: Self-pay | Admitting: Podiatry

## 2020-09-21 NOTE — Telephone Encounter (Signed)
Medical records faxed to [facility] on 09/21/20

## 2020-09-23 ENCOUNTER — Telehealth: Payer: Self-pay | Admitting: Internal Medicine

## 2020-09-23 NOTE — Telephone Encounter (Signed)
Pt c/o swelling: STAT is pt has developed SOB within 24 hours  1) How much weight have you gained and in what time span? 4 lbs since last ov   2) If swelling, where is the swelling located? Ankles   3) Are you currently taking a fluid pill? No but wants to restart furosemide ( send to [facility] )   4) Are you currently SOB? No   5) Do you have a log of your daily weights (if so, list)? no  6) Have you gained 3 pounds in a day or 5 pounds in a week? no  7) Have you traveled recently? No

## 2020-09-23 NOTE — Telephone Encounter (Signed)
No answer after several rings and no voicemail.

## 2020-09-26 NOTE — Telephone Encounter (Signed)
No answer or voicemail after several rings.

## 2020-09-27 MED ORDER — FUROSEMIDE 20 MG PO TABS: 20.0000 mg | ORAL_TABLET | Freq: Every day | ORAL | 1 refills | Status: DC | PRN

## 2020-09-27 MED ORDER — FUROSEMIDE 20 MG PO TABS
20.0000 mg | ORAL_TABLET | Freq: Every day | ORAL | 1 refills | Status: DC
Start: 2020-09-27 — End: 2021-01-03

## 2020-09-27 NOTE — Telephone Encounter (Signed)
It is fine for Mr. Gomes to take furosemide daily.  Yvonne Kendall, MD [facility]

## 2020-09-27 NOTE — Telephone Encounter (Signed)
Spoke to patient. At last office visit on 09/07/20, Dr End changed furosemide to 20 mg daily as needed for weight gain or swelling. Since office visit, he went up about 4 pounds and did not change anything in his diet or salt intake.  He has continued to do everything as he normally does. He started back on daily furosemide about 1 week ago and is almost back to 202 lbs.  He feels he needs to take them daily. He was running out of furosemide and needed refill. Refill was sent. Routing to Dr End to make sure ok to continue taking daily.

## 2020-09-27 NOTE — Telephone Encounter (Signed)
Med list updated

## 2020-09-28 ENCOUNTER — Telehealth: Payer: Self-pay

## 2020-09-28 ENCOUNTER — Telehealth: Payer: Self-pay | Admitting: General Practice

## 2020-09-28 NOTE — Telephone Encounter (Signed)
  Chronic Care Management   Outreach Note  09/28/2020 Name: Chrisopher Pustejovsky MRN: [id number] DOB: [birth year]  Referred by: Marjie Skiff, NP Reason for referral : Chronic Care Management (RNCM Chronic Disease Management and Care Coordination Needs)   An unsuccessful telephone outreach was attempted today. The patient was referred to the case management team for assistance with care management and care coordination.   Follow Up Plan: The care management team will reach out to the patient again over the next 30 to 60 days.   Alto Denver RN, MSN, CCM Community Care Coordinator [place]  [facility] Mobile: [phone]

## 2020-09-29 ENCOUNTER — Telehealth: Payer: Self-pay

## 2020-09-29 NOTE — Chronic Care Management (AMB) (Signed)
  Care Management   Note  09/29/2020 Name: Brendan [name] MRN: [id number] DOB: 1951/01/06  [name] is a 69 y.o. year old male who is a primary care patient of Marjie Skiff, NP and is actively engaged with the care management team. I reached out to [name] by phone today to assist with re-scheduling a follow up visit with the RN Case Manager  Follow up plan: Unsuccessful telephone outreach attempt made. A HIPAA compliant phone message was left for the patient providing contact information and requesting a return call.  The care management team will reach out to the patient again over the next 7 days.  If patient returns call to provider office, please advise to call Embedded Care Management Care Guide Penne Lash  at [phone]  Penne Lash, RMA Care Guide, Embedded Care Coordination [facility]  [place], [place] [address] Direct Dial: [phone] [email or web address] Website: [email or web address]

## 2020-10-03 DIAGNOSIS — H25011 Cortical age-related cataract, right eye: Secondary | ICD-10-CM | POA: Diagnosis not present

## 2020-10-03 DIAGNOSIS — H2511 Age-related nuclear cataract, right eye: Secondary | ICD-10-CM | POA: Diagnosis not present

## 2020-10-03 LAB — HM DIABETES EYE EXAM

## 2020-10-10 NOTE — Chronic Care Management (AMB) (Signed)
  Care Management   Note  10/10/2020 Name: Brendan [name] MRN: [id number] DOB: 29-Jul-1951  Brendan [name] is a 69 y.o. year old male who is a primary care patient of Marjie Skiff, NP and is actively engaged with the care management team. I reached out to [name] by phone today to assist with re-scheduling a follow up visit with the RN Case Manager  Follow up plan: Telephone appointment with care management team member scheduled for:12/07/2020  Penne Lash, RMA Care Guide, Embedded Care Coordination [facility]  [place], [place] [address] Direct Dial: [phone] [email or web address]: [email or web address]

## 2020-10-10 NOTE — Telephone Encounter (Signed)
Pt has been r/s  

## 2020-10-11 DIAGNOSIS — H25011 Cortical age-related cataract, right eye: Secondary | ICD-10-CM | POA: Diagnosis not present

## 2020-10-11 DIAGNOSIS — H2511 Age-related nuclear cataract, right eye: Secondary | ICD-10-CM | POA: Diagnosis not present

## 2020-10-11 DIAGNOSIS — H25811 Combined forms of age-related cataract, right eye: Secondary | ICD-10-CM | POA: Diagnosis not present

## 2020-11-10 ENCOUNTER — Encounter: Payer: Self-pay | Admitting: Nurse Practitioner

## 2020-11-10 DIAGNOSIS — Z23 Encounter for immunization: Secondary | ICD-10-CM | POA: Diagnosis not present

## 2020-11-22 ENCOUNTER — Ambulatory Visit: Payer: Medicare Other | Admitting: Nurse Practitioner

## 2020-12-04 ENCOUNTER — Encounter: Payer: Self-pay | Admitting: Nurse Practitioner

## 2020-12-06 ENCOUNTER — Encounter: Payer: Self-pay | Admitting: Nurse Practitioner

## 2020-12-06 ENCOUNTER — Other Ambulatory Visit: Payer: Self-pay

## 2020-12-06 ENCOUNTER — Ambulatory Visit (INDEPENDENT_AMBULATORY_CARE_PROVIDER_SITE_OTHER): Payer: Medicare Other | Admitting: Nurse Practitioner

## 2020-12-06 VITALS — BP 130/75 | HR 64 | Temp 97.6°F | Ht 74.02 in | Wt 205.0 lb

## 2020-12-06 DIAGNOSIS — J432 Centrilobular emphysema: Secondary | ICD-10-CM | POA: Diagnosis not present

## 2020-12-06 DIAGNOSIS — E538 Deficiency of other specified B group vitamins: Secondary | ICD-10-CM | POA: Diagnosis not present

## 2020-12-06 DIAGNOSIS — E785 Hyperlipidemia, unspecified: Secondary | ICD-10-CM

## 2020-12-06 DIAGNOSIS — E1169 Type 2 diabetes mellitus with other specified complication: Secondary | ICD-10-CM | POA: Diagnosis not present

## 2020-12-06 DIAGNOSIS — Z6825 Body mass index (BMI) 25.0-25.9, adult: Secondary | ICD-10-CM

## 2020-12-06 DIAGNOSIS — I700 Atherosclerosis of aorta: Secondary | ICD-10-CM | POA: Diagnosis not present

## 2020-12-06 DIAGNOSIS — I152 Hypertension secondary to endocrine disorders: Secondary | ICD-10-CM | POA: Diagnosis not present

## 2020-12-06 DIAGNOSIS — R29898 Other symptoms and signs involving the musculoskeletal system: Secondary | ICD-10-CM | POA: Diagnosis not present

## 2020-12-06 DIAGNOSIS — E1140 Type 2 diabetes mellitus with diabetic neuropathy, unspecified: Secondary | ICD-10-CM | POA: Diagnosis not present

## 2020-12-06 DIAGNOSIS — I5032 Chronic diastolic (congestive) heart failure: Secondary | ICD-10-CM

## 2020-12-06 DIAGNOSIS — I2510 Atherosclerotic heart disease of native coronary artery without angina pectoris: Secondary | ICD-10-CM

## 2020-12-06 DIAGNOSIS — Z87891 Personal history of nicotine dependence: Secondary | ICD-10-CM

## 2020-12-06 DIAGNOSIS — E1159 Type 2 diabetes mellitus with other circulatory complications: Secondary | ICD-10-CM

## 2020-12-06 DIAGNOSIS — Z23 Encounter for immunization: Secondary | ICD-10-CM | POA: Diagnosis not present

## 2020-12-06 LAB — BAYER DCA HB A1C WAIVED: HB A1C (BAYER DCA - WAIVED): 7 % — ABNORMAL HIGH (ref <7.0)

## 2020-12-06 MED ORDER — VITAMIN B-12 1000 MCG PO TABS: 1000.0000 ug | ORAL_TABLET | Freq: Every day | ORAL | 4 refills | Status: DC

## 2020-12-06 NOTE — Assessment & Plan Note (Signed)
Chronic, ongoing with BP at goal today and at goal on home readings.  Continue current medication regimen and adjust as needed.  Continue collaboration with cardiology.  Recommend he monitor BP at least a few times a week at home and document.  DASH diet focus.  Return in 3 months.

## 2020-12-06 NOTE — Assessment & Plan Note (Signed)
Recommended eating smaller high protein, low fat meals more frequently and exercising 30 mins a day 5 times a week with a goal of 10-15lb weight loss in the next 3 months. Patient voiced their understanding and motivation to adhere to these recommendations.  

## 2020-12-06 NOTE — Assessment & Plan Note (Signed)
Chronic, ongoing.  A1C 7%, increase from previous. Urine ALB 10 and A:C <30 last visit. Continue current medication regimen and checking BS at home daily.  Discussed addition of Jardiance or Farxiga, which would offer heart benefit, but he wishes to hold off at this time.  Return in 3 months for follow-up, if elevation A1C add on SGLT2.  Continue Gabapentin as prescribed by podiatry and adjust as needed.  Return in 3 months.

## 2020-12-06 NOTE — Assessment & Plan Note (Signed)
Ongoing, start supplement -- script sent.  Return in 6 weeks and recheck level.

## 2020-12-06 NOTE — Assessment & Plan Note (Signed)
Chronic, ongoing.  Continue current medication regimen and adjust as needed.  Lipid panel next visit.  Recent LDL <70. 

## 2020-12-06 NOTE — Assessment & Plan Note (Signed)
Noted on CT imaging.  Continue ASA and Lipitor daily for prevention.  Quit smoking 2 years ago.   °

## 2020-12-06 NOTE — Assessment & Plan Note (Signed)
Chronic, stable.  Continue collaboration with cardiology and current medication regimen as ordered by them.  Recommend: - Reminded to call for an overnight weight gain of >2 pounds or a weekly weight weight of >5 pounds - not adding salt to his food and has been reading food labels. Reviewed the importance of keeping daily sodium intake to 2000mg  daily

## 2020-12-06 NOTE — Assessment & Plan Note (Signed)
Chronic, stable with minimal use of Albuterol.  PFT recently noting minimal change from previous with FEV1 62% and FEV1/FVC 72% last visit.  Continue to monitor and adjust regimen as needed.  Continue Albuterol and recommend he use this if wheezing present or SOB.  Continue cessation of smoking and continue yearly lung screening.

## 2020-12-06 NOTE — Progress Notes (Signed)
BP 130/75   Pulse 64   Temp 97.6 F (36.4 C)   Ht 6' 2.02" (1.88 m)   Wt 205 lb (93 kg)   SpO2 95%   BMI 26.31 kg/m    Subjective:    Patient ID: Brendan [name], male    DOB: October 26, 1951, 69 y.o.   MRN: [id number]  HPI: Brendan [name] is a 69 y.o. male  Chief Complaint  Patient presents with  . Diabetes  . Extremity Weakness    Right leg is weaker than left   DIABETES Last A1C 6.5%.  Continues on Metformin 1000 MG BID.  Was started on Gabapentin 300 MG QHS on 05/09/20 by podiatry for neuropathy.   Hypoglycemic episodes:no Polydipsia/polyuria: no Visual disturbance: no Chest pain: no Paresthesias: no Glucose Monitoring: yes  Accucheck frequency: Daily  Fasting glucose: 110 to 120 range   Post prandial:   Evening:  Before meals: Taking Insulin?: no  Long acting insulin:  Short acting insulin: Blood Pressure Monitoring: not checking Retinal Examination: Up To Date -- had cataract surgery Foot Exam: Up to Date Pneumovax: Up to Date Influenza: Up to Date Aspirin: yes   HYPERTENSION / HYPERLIPIDEMIA Continues on Metoprolol, Lasix, Hygroton, Amlodipine, K+, and Lipitor. Followed by cardiology and last seen by Dr. Okey DupreEnd on 09/07/20 with Lasix changed to PRN -- although he reports taking it every day due to ankle edema.   Allergic to Lisinopril. Aortic atherosclerosis noted on recent lung CT screening.  Last echo showed EF 60-65% in April 2021. Had repeat testing wall motion testing on 05/02/20. Satisfied with current treatment? yes Duration of hypertension: chronic BP monitoring frequency: a few days a week BP range: 110-120/70's range -- 120/79 yesterday BP medication side effects: no Duration of hyperlipidemia: chronic Cholesterol medication side effects: no Cholesterol supplements: none Medication compliance: good compliance Aspirin: yes Recent stressors: no Recurrent headaches: no Visual changes: no Palpitations: no Dyspnea: no Chest pain: no Lower extremity edema: no  Dizzy/lightheaded: no   COPD Has not smoked in 3 years since his bypass surgery.  Was Covid positive 12/30/2019, recovered well. FEV1 62% and FEV1/FVC 72% in May 2021.  Has Singulair he takes daily.  Has Albuterol inhaler, but does not use. Last Lung CT screening 07/01/20 -- noted aortic atherosclerosis which was noted on past imaging and has been discussed with patient + emphysema and hepatic steatosis.   COPD status: stable Satisfied with current treatment?: yes Oxygen use: no Dyspnea frequency: none Cough frequency: none Rescue inhaler frequency: x once or twice a year Limitation of activity: no Productive cough: none Last Spirometry: May 2021 Pneumovax: Up to Date Influenza: Up to Date   LOWER EXTREMITY WEAKNESS Reports lower extremity weakness ongoing for years R>L with recent worsening, slowly getting weaker.  Left leg does not bother him, it is right that causes issues.  History of cartilage taken out of right knee in early 80's, playing basketball.  Did not go to therapy back then.  Has occasional pain to back and right knee, but not often.  Has diabetic shoes, denies difficulty with walking.  Last B12 level 359.    Has underlying neuropathy and takes medication for this, Gabapentin.  No recent imaging of lumbar spine present.   Frequency: intermittent Bilateral: R>L Numbness: occasional Decreased sensation: occasional Weakness: yes Context: worse Alleviating factors: nothing Aggravating factors: when walking -- feels like it may give out on him, no recent falls Treatments attempted: none  Relevant past medical, surgical, family and social history reviewed  and updated as indicated. Interim medical history since our last visit reviewed. Allergies and medications reviewed and updated.  Review of Systems  Constitutional: Negative for activity change, diaphoresis, fatigue and fever.  Respiratory: Negative for cough, chest tightness, shortness of breath and wheezing.    Cardiovascular: Negative for chest pain, palpitations and leg swelling.  Gastrointestinal: Negative.   Endocrine: Negative for cold intolerance, heat intolerance, polydipsia, polyphagia and polyuria.  Neurological: Positive for weakness (right lower leg). Negative for dizziness, syncope, light-headedness, numbness and headaches.  Psychiatric/Behavioral: Negative.     Per HPI unless specifically indicated above     Objective:    BP 130/75   Pulse 64   Temp 97.6 F (36.4 C)   Ht 6' 2.02" (1.88 m)   Wt 205 lb (93 kg)   SpO2 95%   BMI 26.31 kg/m   Wt Readings from Last 3 Encounters:  12/06/20 205 lb (93 kg)  09/07/20 202 lb 8 oz (91.9 kg)  08/23/20 204 lb (92.5 kg)    Physical Exam Vitals and nursing note reviewed.  Constitutional:      General: He is awake. He is not in acute distress.    Appearance: He is well-developed and overweight. He is not ill-appearing.  HENT:     Head: Normocephalic and atraumatic.     Right Ear: Hearing normal. No drainage.     Left Ear: Hearing normal. No drainage.  Eyes:     General: Lids are normal.        Right eye: No discharge.        Left eye: No discharge.     Conjunctiva/sclera: Conjunctivae normal.     Pupils: Pupils are equal, round, and reactive to light.  Neck:     Vascular: No carotid bruit.  Cardiovascular:     Rate and Rhythm: Normal rate and regular rhythm.     Pulses:          Dorsalis pedis pulses are 2+ on the right side and 2+ on the left side.       Posterior tibial pulses are 2+ on the right side and 2+ on the left side.     Heart sounds: Normal heart sounds, S1 normal and S2 normal. No murmur heard.  No gallop.   Pulmonary:     Effort: Pulmonary effort is normal. No accessory muscle usage or respiratory distress.     Breath sounds: Normal breath sounds.  Abdominal:     General: Bowel sounds are normal.     Palpations: Abdomen is soft.     Tenderness: There is no abdominal tenderness.  Musculoskeletal:         General: Normal range of motion.     Cervical back: Normal range of motion and neck supple.     Right lower leg: No tenderness. No edema.     Left lower leg: No tenderness. No edema.     Right foot: Normal range of motion.     Left foot: Normal range of motion.     Comments: Slightly decreased strength R vs L lower extremity.  Some discomfort to right hip with flexion.  Feet:     Right foot:     Protective Sensation: 7 sites tested. 10 sites sensed.     Skin integrity: Skin integrity normal.     Left foot:     Protective Sensation: 6 sites tested. 10 sites sensed.     Skin integrity: Skin integrity normal.  Skin:    General: Skin  is warm and dry.  Neurological:     Mental Status: He is alert and oriented to person, place, and time.  Psychiatric:        Attention and Perception: Attention normal.        Mood and Affect: Mood normal.        Speech: Speech normal.        Behavior: Behavior normal. Behavior is cooperative.     Results for orders placed or performed in visit on 08/26/20  HM DIABETES EYE EXAM  Result Value Ref Range   HM Diabetic Eye Exam No Retinopathy No Retinopathy      Assessment & Plan:   Problem List Items Addressed This Visit      Cardiovascular and Mediastinum   Hypertension associated with diabetes (HCC)    Chronic, ongoing with BP at goal today and at goal on home readings.  Continue current medication regimen and adjust as needed.  Continue collaboration with cardiology.  Recommend he monitor BP at least a few times a week at home and document.  DASH diet focus.  Return in 3 months.      Aortic atherosclerosis (HCC)    Noted on CT imaging.  Continue ASA and Lipitor daily for prevention.  Quit smoking 2 years ago.        Chronic heart failure with preserved ejection fraction (HFpEF) (HCC)    Chronic, stable.  Continue collaboration with cardiology and current medication regimen as ordered by them.  Recommend: - Reminded to call for an overnight  weight gain of >2 pounds or a weekly weight weight of >5 pounds - not adding salt to his food and has been reading food labels. Reviewed the importance of keeping daily sodium intake to 2000mg  daily          Respiratory   Centrilobular emphysema (HCC)    Chronic, stable with minimal use of Albuterol.  PFT recently noting minimal change from previous with FEV1 62% and FEV1/FVC 72% last visit.  Continue to monitor and adjust regimen as needed.  Continue Albuterol and recommend he use this if wheezing present or SOB.  Continue cessation of smoking and continue yearly lung screening.        Endocrine   Controlled type 2 diabetes with neuropathy (HCC) - Primary    Chronic, ongoing.  A1C 7%, increase from previous. Urine ALB 10 and A:C <30 last visit. Continue current medication regimen and checking BS at home daily.  Discussed addition of Jardiance or Farxiga, which would offer heart benefit, but he wishes to hold off at this time.  Return in 3 months for follow-up, if elevation A1C add on SGLT2.  Continue Gabapentin as prescribed by podiatry and adjust as needed.  Return in 3 months.      Relevant Orders   Bayer DCA Hb A1c Waived   Hyperlipidemia associated with type 2 diabetes mellitus (HCC)    Chronic, ongoing.  Continue current medication regimen and adjust as needed.  Lipid panel next visit.  Recent LDL <70.        Nervous and Auditory   Weakness of right lower extremity    Ongoing for years with worsening he reports, mild strength changes noted on exam R>L.  Recommend PT, which he refuses at this time.  Will obtain imaging lumbar spine and recommend he start taking B12 daily, script sent, as this was on lower side this year and he does take long term Metformin.  Return in 6 weeks, sooner if worsening.  Relevant Orders   DG Lumbar Spine Complete     Other   Personal history of tobacco use, presenting hazards to health    Continue annual lung screening.  Recommend continued  cessation.      BMI 25.0-25.9,adult    Recommended eating smaller high protein, low fat meals more frequently and exercising 30 mins a day 5 times a week with a goal of 10-15lb weight loss in the next 3 months. Patient voiced their understanding and motivation to adhere to these recommendations.       B12 deficiency    Ongoing, start supplement -- script sent.  Return in 6 weeks and recheck level.       Other Visit Diagnoses    Need for influenza vaccination       Flu vaccine today   Relevant Orders   Flu Vaccine QUAD High Dose(Fluad) (Completed)       Follow up plan: Return in about 6 weeks (around 01/17/2021) for Right leg weakness.

## 2020-12-06 NOTE — Patient Instructions (Signed)
Neuropathic Pain Neuropathic pain is pain caused by damage to the nerves that are responsible for certain sensations in your body (sensory nerves). The pain can be caused by:  Damage to the sensory nerves that send signals to your spinal cord and brain (peripheral nervous system).  Damage to the sensory nerves in your brain or spinal cord (central nervous system). Neuropathic pain can make you more sensitive to pain. Even a minor sensation can feel very painful. This is usually a long-term condition that can be difficult to treat. The type of pain differs from person to person. It may:  Start suddenly (acute), or it may develop slowly and last for a long time (chronic).  Come and go as damaged nerves heal, or it may stay at the same level for years.  Cause emotional distress, loss of sleep, and a lower quality of life. What are the causes? The most common cause of this condition is diabetes. Many other diseases and conditions can also cause neuropathic pain. Causes of neuropathic pain can be classified as:  Toxic. This is caused by medicines and chemicals. The most common cause of toxic neuropathic pain is damage from cancer treatments (chemotherapy).  Metabolic. This can be caused by: ? Diabetes. This is the most common disease that damages the nerves. ? Lack of vitamin B from long-term alcohol abuse.  Traumatic. Any injury that cuts, crushes, or stretches a nerve can cause damage and pain. A common example is feeling pain after losing an arm or leg (phantom limb pain).  Compression-related. If a sensory nerve gets trapped or compressed for a long period of time, the blood supply to the nerve can be cut off.  Vascular. Many blood vessel diseases can cause neuropathic pain by decreasing blood supply and oxygen to nerves.  Autoimmune. This type of pain results from diseases in which the body's defense system (immune system) mistakenly attacks sensory nerves. Examples of autoimmune diseases  that can cause neuropathic pain include lupus and multiple sclerosis.  Infectious. Many types of viral infections can damage sensory nerves and cause pain. Shingles infection is a common cause of this type of pain.  Inherited. Neuropathic pain can be a symptom of many diseases that are passed down through families (genetic). What increases the risk? You are more likely to develop this condition if:  You have diabetes.  You smoke.  You drink too much alcohol.  You are taking certain medicines, including medicines that kill cancer cells (chemotherapy) or that treat immune system disorders. What are the signs or symptoms? The main symptom is pain. Neuropathic pain is often described as:  Burning.  Shock-like.  Stinging.  Hot or cold.  Itching. How is this diagnosed? No single test can diagnose neuropathic pain. It is diagnosed based on:  Physical exam and your symptoms. Your health care provider will ask you about your pain. You may be asked to use a pain scale to describe how bad your pain is.  Tests. These may be done to see if you have a high sensitivity to pain and to help find the cause and location of any sensory nerve damage. They include: ? Nerve conduction studies to test how well nerve signals travel through your sensory nerves (electrodiagnostic testing). ? Stimulating your sensory nerves through electrodes on your skin and measuring the response in your spinal cord and brain (somatosensory evoked potential).  Imaging studies, such as: ? X-rays. ? CT scan. ? MRI. How is this treated? Treatment for neuropathic pain may change   over time. You may need to try different treatment options or a combination of treatments. Some options include:  Treating the underlying cause of the neuropathy, such as diabetes, kidney disease, or vitamin deficiencies.  Stopping medicines that can cause neuropathy, such as chemotherapy.  Medicine to relieve pain. Medicines may  include: ? Prescription or over-the-counter pain medicine. ? Anti-seizure medicine. ? Antidepressant medicines. ? Pain-relieving patches that are applied to painful areas of skin. ? A medicine to numb the area (local anesthetic), which can be injected as a nerve block.  Transcutaneous nerve stimulation. This uses electrical currents to block painful nerve signals. The treatment is painless.  Alternative treatments, such as: ? Acupuncture. ? Meditation. ? Massage. ? Physical therapy. ? Pain management programs. ? Counseling. Follow these instructions at home: Medicines   Take over-the-counter and prescription medicines only as told by your health care provider.  Do not drive or use heavy machinery while taking prescription pain medicine.  If you are taking prescription pain medicine, take actions to prevent or treat constipation. Your health care provider may recommend that you: ? Drink enough fluid to keep your urine pale yellow. ? Eat foods that are high in fiber, such as fresh fruits and vegetables, whole grains, and beans. ? Limit foods that are high in fat and processed sugars, such as fried or sweet foods. ? Take an over-the-counter or prescription medicine for constipation. Lifestyle   Have a good support system at home.  Consider joining a chronic pain support group.  Do not use any products that contain nicotine or tobacco, such as cigarettes and e-cigarettes. If you need help quitting, ask your health care provider.  Do not drink alcohol. General instructions  Learn as much as you can about your condition.  Work closely with all your health care providers to find the treatment plan that works best for you.  Ask your health care provider what activities are safe for you.  Keep all follow-up visits as told by your health care provider. This is important. Contact a health care provider if:  Your pain treatments are not working.  You are having side effects  from your medicines.  You are struggling with tiredness (fatigue), mood changes, depression, or anxiety. Summary  Neuropathic pain is pain caused by damage to the nerves that are responsible for certain sensations in your body (sensory nerves).  Neuropathic pain may come and go as damaged nerves heal, or it may stay at the same level for years.  Neuropathic pain is usually a long-term condition that can be difficult to treat. Consider joining a chronic pain support group. This information is not intended to replace advice given to you by your health care provider. Make sure you discuss any questions you have with your health care provider. Document Revised: 04/09/2019 Document Reviewed: 01/03/2018 Elsevier Patient Education  2020 [employer] Inc.  

## 2020-12-06 NOTE — Assessment & Plan Note (Signed)
Ongoing for years with worsening he reports, mild strength changes noted on exam R>L.  Recommend PT, which he refuses at this time.  Will obtain imaging lumbar spine and recommend he start taking B12 daily, script sent, as this was on lower side this year and he does take long term Metformin.  Return in 6 weeks, sooner if worsening.

## 2020-12-06 NOTE — Assessment & Plan Note (Signed)
Continue annual lung screening.  Recommend continued cessation. ?

## 2020-12-07 ENCOUNTER — Telehealth: Payer: Self-pay | Admitting: General Practice

## 2020-12-07 ENCOUNTER — Telehealth: Payer: Medicare Other

## 2020-12-07 NOTE — Telephone Encounter (Signed)
  Chronic Care Management   Outreach Note  12/07/2020 Name: Brendan [name] MRN: [id number] DOB: 02-23-1951  Referred by: Marjie Skiff, NP Reason for referral : Chronic Care Management (RNCM Chronic Disease management and Care Coordination Needs)   Brendan [name] is enrolled in a Managed Medicaid Health Plan: No  An unsuccessful telephone outreach was attempted today. The patient was referred to the case management team for assistance with care management and care coordination.   Follow Up Plan: The care management team will reach out to the patient again over the next 30 to 60 days.   Alto Denver RN, MSN, CCM Community Care Coordinator [place]  [facility] Mobile: [phone]

## 2020-12-08 ENCOUNTER — Other Ambulatory Visit: Payer: Self-pay | Admitting: Nurse Practitioner

## 2020-12-08 NOTE — Telephone Encounter (Signed)
Requested medication (s) are due for refill today: expired medications  Requested medication (s) are on the active medication list: yes   Last refill:  11/23/2019 #90 3 refills  Future visit scheduled: yes   Notes to clinic:  expired medication, Do you want to renew Rx? Patient seen 2 days ago      Requested Prescriptions  Pending Prescriptions Disp Refills   tamsulosin (FLOMAX) 0.4 MG CAPS capsule [Pharmacy Med Name: TAMSULOSIN HCL 0.4 MG CAPSULE] 90 capsule 0    Sig: Take 1 capsule (0.4 mg total) by mouth daily.      Urology: Alpha-Adrenergic Blocker Passed - 12/08/2020  4:48 PM      Passed - Last BP in normal range    BP Readings from Last 1 Encounters:  12/06/20 130/75          Passed - Valid encounter within last 12 months    Recent Outpatient Visits           2 days ago Controlled type 2 diabetes with neuropathy (HCC)   [facility] [place], [name], NP   3 months ago Controlled type 2 diabetes with neuropathy (HCC)   [facility] [place], [name], NP   6 months ago Controlled type 2 diabetes with neuropathy (HCC)   [facility] [place], Luis Lopez T, NP   9 months ago Hammer toe of right foot   [employer], Megan P, DO   9 months ago Type 2 diabetes mellitus with hyperglycemia, without long-term current use of insulin (HCC)   [facility] [place], Dorie Rank, NP       Future Appointments             In 1 month [name], Dorie Rank, NP [facility], [facility]   In 3 months End, Cristal Deer, MD [employer], [facility]   In 3 months  [facility], [facility]

## 2020-12-09 ENCOUNTER — Other Ambulatory Visit: Payer: Self-pay | Admitting: Nurse Practitioner

## 2020-12-09 NOTE — Telephone Encounter (Signed)
Routing to provider  

## 2020-12-09 NOTE — Telephone Encounter (Signed)
Requested Prescriptions  Pending Prescriptions Disp Refills  . metFORMIN (GLUCOPHAGE) 1000 MG tablet [Pharmacy Med Name: METFORMIN HCL 1,000 MG TABLET] 180 tablet 1    Sig: Take 1 tablet (1,000 mg total) by mouth 2 (two) times daily with a meal.     Endocrinology:  Diabetes - Biguanides Passed - 12/09/2020  4:27 PM      Passed - Cr in normal range and within 360 days    Creatinine, Ser  Date Value Ref Range Status  08/23/2020 1.20 0.76 - 1.27 mg/dL Final         Passed - HBA1C is between 0 and 7.9 and within 180 days    Hemoglobin A1C  Date Value Ref Range Status  09/17/2016 5.9%  Final   HB A1C (BAYER DCA - WAIVED)  Date Value Ref Range Status  12/06/2020 7.0 (H) <7.0 % Final    Comment:                                          Diabetic Adult            <7.0                                       Healthy Adult        4.3 - 5.7                                                           (DCCT/NGSP) American Diabetes Association's Summary of Glycemic Recommendations for Adults with Diabetes: Hemoglobin A1c <7.0%. More stringent glycemic goals (A1c <6.0%) may further reduce complications at the cost of increased risk of hypoglycemia.          Passed - AA eGFR in normal range and within 360 days    GFR calc Af Amer  Date Value Ref Range Status  08/23/2020 71 >59 mL/min/1.73 Final    Comment:    **[facility] currently reports eGFR in compliance with the current**   recommendations of the [employer]. [facility] will   update reporting as new guidelines are published from the NKF-ASN   Task force.    GFR calc non Af Amer  Date Value Ref Range Status  08/23/2020 61 >59 mL/min/1.73 Final         Passed - Valid encounter within last 6 months    Recent Outpatient Visits          3 days ago Controlled type 2 diabetes with neuropathy ([place])   [place], [name], NP   3 months ago Controlled type 2 diabetes with neuropathy ([place])   [place], [name], NP   6 months ago Controlled type 2 diabetes with neuropathy ([place])   [place], [place] T, NP   9 months ago Hammer toe of right foot   [employer], Megan P, DO   9 months ago Type 2 diabetes mellitus with hyperglycemia, without long-term current use of insulin ([place])   [place], [name], NP      Future Appointments  In 1 month [name], [name], NP [employer] [facility], [place]   In 2 months End, [name], MD [facility], [facility]   In 3 months  [employer], [place]

## 2020-12-12 ENCOUNTER — Telehealth: Payer: Self-pay | Admitting: Internal Medicine

## 2020-12-12 MED ORDER — POTASSIUM CHLORIDE CRYS ER 20 MEQ PO TBCR
EXTENDED_RELEASE_TABLET | ORAL | 1 refills | Status: DC
Start: 2020-12-12 — End: 2021-06-08

## 2020-12-12 NOTE — Telephone Encounter (Signed)
Rx request sent to pharmacy.  

## 2020-12-12 NOTE — Telephone Encounter (Signed)
*  STAT* If patient is at the pharmacy, call can be transferred to refill team.   1. Which medications need to be refilled? (please list name of each medication and dose if known) potassium chloride 20 MEQ 2 tablets daily   2. Which pharmacy/location (including street and city if local pharmacy) is medication to be sent to? [facility]  3. Do they need a 30 day or 90 day supply? 90 day

## 2020-12-13 ENCOUNTER — Ambulatory Visit
Admission: RE | Admit: 2020-12-13 | Discharge: 2020-12-13 | Disposition: A | Discharge status: Home / Self Care | Payer: Medicare Other | Attending: Nurse Practitioner | Admitting: Nurse Practitioner

## 2020-12-13 ENCOUNTER — Other Ambulatory Visit: Payer: Self-pay

## 2020-12-13 ENCOUNTER — Ambulatory Visit
Admission: RE | Admit: 2020-12-13 | Discharge: 2020-12-13 | Disposition: A | Discharge status: Home / Self Care | Payer: Medicare Other | Source: Ambulatory Visit | Attending: Nurse Practitioner | Admitting: Nurse Practitioner

## 2020-12-13 DIAGNOSIS — R29898 Other symptoms and signs involving the musculoskeletal system: Secondary | ICD-10-CM | POA: Insufficient documentation

## 2020-12-13 DIAGNOSIS — M5136 Other intervertebral disc degeneration, lumbar region: Secondary | ICD-10-CM | POA: Diagnosis not present

## 2020-12-13 DIAGNOSIS — I700 Atherosclerosis of aorta: Secondary | ICD-10-CM | POA: Diagnosis not present

## 2020-12-14 ENCOUNTER — Other Ambulatory Visit: Payer: Self-pay | Admitting: Nurse Practitioner

## 2020-12-14 DIAGNOSIS — R29898 Other symptoms and signs involving the musculoskeletal system: Secondary | ICD-10-CM

## 2020-12-14 NOTE — Progress Notes (Signed)
Good afternoon, please let Teryl know his imaging has returned and it does show some degenerative disc (loss of cushion) in lower back.  I would recommend since he is having some weakness we get him in with ortho to further evaluate, which I can place referral for, and would benefit from some physical therapy.  If he is agreeable to this please let me know and I can place referrals.  Have a great day!! Keep being awesome!!  Thank you for allowing me to participate in your care. Kindest regards, Haze Antillon

## 2020-12-15 NOTE — Telephone Encounter (Signed)
Pt has been r/s  

## 2020-12-26 ENCOUNTER — Other Ambulatory Visit: Payer: Self-pay | Admitting: Nurse Practitioner

## 2021-01-03 ENCOUNTER — Telehealth: Payer: Self-pay | Admitting: Internal Medicine

## 2021-01-03 MED ORDER — FUROSEMIDE 20 MG PO TABS
20.0000 mg | ORAL_TABLET | Freq: Every day | ORAL | 1 refills | Status: DC | PRN
Start: 2021-01-03 — End: 2021-04-04

## 2021-01-03 NOTE — Telephone Encounter (Signed)
*  STAT* If patient is at the pharmacy, call can be transferred to refill team.   1. Which medications need to be refilled? (please list name of each medication and dose if known)   furosemide  2. Which pharmacy/location (including street and city if local pharmacy) is medication to be sent to?  [address] [facility]   3. Do they need a 30 day or 90 day supply? 90

## 2021-01-17 ENCOUNTER — Other Ambulatory Visit: Payer: Self-pay

## 2021-01-17 ENCOUNTER — Ambulatory Visit (INDEPENDENT_AMBULATORY_CARE_PROVIDER_SITE_OTHER): Payer: Medicare HMO | Admitting: Nurse Practitioner

## 2021-01-17 ENCOUNTER — Encounter: Payer: Self-pay | Admitting: Nurse Practitioner

## 2021-01-17 VITALS — BP 132/72 | HR 67 | Temp 97.6°F | Ht 74.09 in | Wt 205.6 lb

## 2021-01-17 DIAGNOSIS — R29898 Other symptoms and signs involving the musculoskeletal system: Secondary | ICD-10-CM

## 2021-01-17 DIAGNOSIS — M5136 Other intervertebral disc degeneration, lumbar region: Secondary | ICD-10-CM

## 2021-01-17 NOTE — Patient Instructions (Signed)
Degenerative Disk Disease  Degenerative disk disease is a condition caused by changes that occur in the spinal disks as a person ages. Spinal disks are soft and compressible disks located between the bones of your spine (vertebrae). These disks act like shock absorbers. Degenerative disk disease can affect the whole spine. However, the neck and lower back are most often affected. Many changes can occur in the spinal disks with aging, such as:  The spinal disks may dry and shrink.  Small tears may occur in the tough, outer covering of the disk (annulus).  The disk space may become smaller due to loss of water.  Abnormal growths in the bone (spurs) may occur. This can put pressure on the nerve roots exiting the spinal canal, causing pain.  The spinal canal may become narrowed. What are the causes? This condition may be caused by:  Normal degeneration with age.  Injuries.  Certain activities and sports that cause damage. What increases the risk? The following factors may make you more likely to develop this condition:  Being overweight.  Having a family history of degenerative disk disease.  Smoking and use of products that contain nicotine and tobacco.  Sudden injury.  Doing work that requires heavy lifting. What are the signs or symptoms? Symptoms of this condition include:  Pain that varies in intensity. Some people have no pain, while others have severe pain. The location of the pain depends on the part of your backbone that is affected. You may have: ? Pain in your neck or arm if a disk in your neck area is affected. ? Pain in your back, buttocks, or legs if a disk in your lower back is affected.  Pain that becomes worse while bending or reaching up, or with twisting movements.  Pain that may start gradually and worsen as time passes. It may also start after a major or minor injury.  Numbness or tingling in the arms or legs. How is this diagnosed? This condition may  be diagnosed based on:  Your symptoms and medical history.  A physical exam.  Imaging tests, including: ? X-ray of the spine. ? CT scan. ? MRI. How is this treated? This condition may be treated with:  Medicines.  Injection of steroids into the back.  Rehabilitation exercises. These activities aim to strengthen muscles in your back and abdomen to better support your spine. If treatments do not help to relieve your symptoms or you have severe pain, you may need surgery. Follow these instructions at home: Medicines  Take over-the-counter and prescription medicines only as told by your health care provider.  Ask your health care provider if the medicine prescribed to you: ? Requires you to avoid driving or using machinery. ? Can cause constipation. You may need to take these actions to prevent or treat constipation:  Drink enough fluid to keep your urine pale yellow.  Take over-the-counter or prescription medicines.  Eat foods that are high in fiber, such as beans, whole grains, and fresh fruits and vegetables.  Limit foods that are high in fat and processed sugars, such as fried or sweet foods. Activity  Rest as told by your health care provider.  Avoid sitting for a long time without moving. Get up to take short walks every 1-2 hours. This is important to improve blood flow and breathing. Ask for help if you feel weak or unsteady.  Return to your normal activities as told by your health care provider. Ask your health care provider what activities   are safe for you.  Perform relaxation exercises as told by your health care provider.  Maintain good posture.  Do not lift anything that is heavier than 10 lb (4.5 kg), or the limit that you are told, until your health care provider says that it is safe.  Follow proper lifting and walking techniques as told by your health care provider. Managing pain, stiffness, and swelling  If directed, put ice on the painful area. Icing  can help to relieve pain. To do this: ? Put ice in a plastic bag. ? Place a towel between your skin and the bag. ? Leave the ice on for 20 minutes, 2-3 times a day. ? Remove the ice if your skin turns bright red. This is very important. If you cannot feel pain, heat, or cold, you have a greater risk of damage to the area.  If directed, apply heat to the painful area as often as told by your health care provider. Heat can reduce the stiffness of your muscles. Use the heat source that your health care provider recommends, such as a moist heat pack or a heating pad. ? Place a towel between your skin and the heat source. ? Leave the heat on for 20-30 minutes. ? Remove the heat if your skin turns bright red. This is especially important if you are unable to feel pain, heat, or cold. You may have a greater risk of getting burned.      General instructions  Change your sitting, standing, and sleeping habits as told by your health care provider.  Avoid sitting in the same position for long periods of time. Change positions frequently.  Lose weight or maintain a healthy weight as told by your health care provider.  Do not use any products that contain nicotine or tobacco, such as cigarettes, e-cigarettes, and chewing tobacco. If you need help quitting, ask your health care provider.  Wear supportive footwear.  Keep all follow-up visits. This is important. This may include visits for physical therapy. Contact a health care provider if you:  Have pain that does not go away within 1-4 weeks.  Lose your appetite.  Lose weight without trying. Get help right away if you:  Have severe pain.  Notice weakness in your arms, hands, or legs.  Begin to lose control of your bladder or bowel movements.  Have fevers or night sweats. Summary  Degenerative disk disease is a condition caused by changes that occur in the spinal disks as a person ages.  This condition can affect the whole spine.  However, the neck and lower back are most often affected.  Take over-the-counter and prescription medicines only as told by your health care provider. This information is not intended to replace advice given to you by your health care provider. Make sure you discuss any questions you have with your health care provider. Document Revised: 03/31/2020 Document Reviewed: 03/31/2020 Elsevier Patient Education  2021 Elsevier Inc.  

## 2021-01-17 NOTE — Assessment & Plan Note (Signed)
Ongoing for years with worsening he reports, mild strength changes noted on exam R>L.  Degenerative disc noted on lumbar imaging.  New referral to PT placed to obtain closer to his home, he is interested in this -- appreciate their assessment and recommendations.  Continue B12 at home daily and will recheck next visit.  If ongoing weakness will consider MRI of lumbar spine.  Return in 2 months, sooner if worsening.

## 2021-01-17 NOTE — Progress Notes (Signed)
BP 132/72   Pulse 67   Temp 97.6 F (36.4 C) (Oral)   Ht 6' 2.09" (1.882 m)   Wt 205 lb 9.6 oz (93.3 kg)   SpO2 95%   BMI 26.33 kg/m    Subjective:    Patient ID: Brendan [name], male    DOB: November 11, 1951, 70 y.o.   MRN: [id number]  HPI: Brendan [name] is a 70 y.o. male  Chief Complaint  Patient presents with  . Extremity Weakness    Right Leg Weakness about the same, has not follow up with anyone about it    LOWER EXTREMITY WEAKNESS Follow-up lower extremity weakness, initially seen 12/06/20.  Physical therapy ordered on 12/14/20, but has not scheduled -- would like location closer to his home for this.  Reports lower extremity weakness ongoing for years R>L with recent worsening, slowly getting weaker.  Left leg does not bother him, it is right that causes issues.  History of cartilage taken out of right knee in early 80's, playing basketball.  Did not go to therapy back then.  Has occasional pain to back and right knee, but not often.  Has diabetic shoes, denies difficulty with walking.  Last B12 level 359.  Recent lumbar spine imaging noted severe degenerative disc disease present, most severe L2-L3.  Has underlying neuropathy and takes medication for this, Gabapentin.  No recent imaging of lumbar spine present.   Frequency: intermittent Bilateral: R>L Numbness: occasional Decreased sensation: occasional Weakness: yes Context: worse Alleviating factors: nothing Aggravating factors: when walking -- feels like it may give out on him, no recent falls Treatments attempted: none  Relevant past medical, surgical, family and social history reviewed and updated as indicated. Interim medical history since our last visit reviewed. Allergies and medications reviewed and updated.  Review of Systems  Constitutional: Negative for activity change, diaphoresis, fatigue and fever.  Respiratory: Negative for cough, chest tightness, shortness of breath and wheezing.   Cardiovascular: Negative  for chest pain, palpitations and leg swelling.  Gastrointestinal: Negative.   Endocrine: Negative for cold intolerance, heat intolerance, polydipsia, polyphagia and polyuria.  Neurological: Positive for weakness (right lower leg). Negative for dizziness, syncope, light-headedness, numbness and headaches.  Psychiatric/Behavioral: Negative.     Per HPI unless specifically indicated above     Objective:    BP 132/72   Pulse 67   Temp 97.6 F (36.4 C) (Oral)   Ht 6' 2.09" (1.882 m)   Wt 205 lb 9.6 oz (93.3 kg)   SpO2 95%   BMI 26.33 kg/m   Wt Readings from Last 3 Encounters:  01/17/21 205 lb 9.6 oz (93.3 kg)  12/06/20 205 lb (93 kg)  09/07/20 202 lb 8 oz (91.9 kg)    Physical Exam Vitals and nursing note reviewed.  Constitutional:      General: He is awake. He is not in acute distress.    Appearance: He is well-developed and overweight. He is not ill-appearing.  HENT:     Head: Normocephalic and atraumatic.     Right Ear: Hearing normal. No drainage.     Left Ear: Hearing normal. No drainage.  Eyes:     General: Lids are normal.        Right eye: No discharge.        Left eye: No discharge.     Conjunctiva/sclera: Conjunctivae normal.     Pupils: Pupils are equal, round, and reactive to light.  Neck:     Vascular: No carotid bruit.  Cardiovascular:  Rate and Rhythm: Normal rate and regular rhythm.     Pulses:          Dorsalis pedis pulses are 2+ on the right side and 2+ on the left side.       Posterior tibial pulses are 2+ on the right side and 2+ on the left side.     Heart sounds: Normal heart sounds, S1 normal and S2 normal. No murmur heard. No gallop.   Pulmonary:     Effort: Pulmonary effort is normal. No accessory muscle usage or respiratory distress.     Breath sounds: Normal breath sounds.  Abdominal:     General: Bowel sounds are normal.     Palpations: Abdomen is soft.     Tenderness: There is no abdominal tenderness.  Musculoskeletal:         General: Normal range of motion.     Cervical back: Normal range of motion and neck supple.     Right lower leg: No tenderness. No edema.     Left lower leg: No tenderness. No edema.     Right foot: Normal range of motion.     Left foot: Normal range of motion.     Comments: Slightly decreased strength R vs L lower extremity.  Some discomfort to right hip with flexion.  Feet:     Right foot:     Protective Sensation: 7 sites tested. 10 sites sensed.     Skin integrity: Skin integrity normal.     Left foot:     Protective Sensation: 6 sites tested. 10 sites sensed.     Skin integrity: Skin integrity normal.  Skin:    General: Skin is warm and dry.  Neurological:     Mental Status: He is alert and oriented to person, place, and time.  Psychiatric:        Attention and Perception: Attention normal.        Mood and Affect: Mood normal.        Speech: Speech normal.        Behavior: Behavior normal. Behavior is cooperative.     Results for orders placed or performed in visit on 12/06/20  Bayer DCA Hb A1c Waived  Result Value Ref Range   HB A1C (BAYER DCA - WAIVED) 7.0 (H) <7.0 %      Assessment & Plan:   Problem List Items Addressed This Visit      Nervous and Auditory   Weakness of right lower extremity - Primary    Ongoing for years with worsening he reports, mild strength changes noted on exam R>L.  Degenerative disc noted on lumbar imaging.  New referral to PT placed to obtain closer to his home, he is interested in this -- appreciate their assessment and recommendations.  Continue B12 at home daily and will recheck next visit.  If ongoing weakness will consider MRI of lumbar spine.  Return in 2 months, sooner if worsening.        Relevant Orders   Ambulatory referral to Physical Therapy     Musculoskeletal and Integument   Degenerative disc disease, lumbar    Noted on imaging December 2021.  Refer to right lower extremity weakness for further plan.      Relevant  Orders   Ambulatory referral to Physical Therapy       Follow up plan: Return in about 2 months (around 03/17/2021) for T2DM, HTN/HLD, LEG WEAKNESS.

## 2021-01-17 NOTE — Assessment & Plan Note (Signed)
Noted on imaging December 2021.  Refer to right lower extremity weakness for further plan.

## 2021-01-19 ENCOUNTER — Telehealth: Payer: Self-pay

## 2021-01-19 NOTE — Telephone Encounter (Signed)
Copied from CRM [id number]. Topic: General - Other >> Jan 19, 2021  9:03 AM Gaetana Michaelis A wrote: Reason for CRM: Sandria Bales called in regards to a letter for patient initially signed and sent by PCP on  11/10/20 The letter relates to functional limitations of the patient. The letter was to include probable diagnoses and specified equipment needed. The letter was to include an additional page that was not attached. Sandria Bales is asking for letter or attachments to be resent. Fax number [phone]

## 2021-01-20 NOTE — Telephone Encounter (Signed)
Called and LVM for Victorino Dike asking for her to please return my call.

## 2021-01-20 NOTE — Telephone Encounter (Signed)
Jolene, do you by any chance know what letter they are referring to? The only one I can find in the chart is one regarding rescheduling an appointment.

## 2021-01-20 NOTE — Telephone Encounter (Signed)
I do not recall, I would recommend they refax all papers and instructions so I can review further and sign as needed.

## 2021-01-23 NOTE — Telephone Encounter (Signed)
Brendan [name] is returning Brendan [name]'s call. Brendan [name] says that the letter was filled out by provider back on November 11th. Sge says that at the bottom of the letter that was sent it reads see attachment. Brendan [name] says but there were no attachments. Brendan [name] says if documents were attached she would like to have them sent/faxed [phone].   Please assist.

## 2021-01-24 NOTE — Telephone Encounter (Signed)
Form printed along with problem lost and re-faxed to [facility].

## 2021-01-30 ENCOUNTER — Ambulatory Visit: Payer: Medicare Other | Admitting: Podiatry

## 2021-01-31 DIAGNOSIS — M5431 Sciatica, right side: Secondary | ICD-10-CM | POA: Diagnosis not present

## 2021-01-31 DIAGNOSIS — M545 Low back pain, unspecified: Secondary | ICD-10-CM | POA: Diagnosis not present

## 2021-01-31 DIAGNOSIS — R2689 Other abnormalities of gait and mobility: Secondary | ICD-10-CM | POA: Diagnosis not present

## 2021-02-03 ENCOUNTER — Telehealth: Payer: Medicare Other

## 2021-02-03 ENCOUNTER — Telehealth: Payer: Self-pay | Admitting: General Practice

## 2021-02-03 NOTE — Telephone Encounter (Signed)
  Chronic Care Management   Outreach Note  02/03/2021 Name: Brendan [name] MRN: [id number] DOB: [birth year]  Referred by: Marjie Skiff, NP Reason for referral : Appointment (RNCM: Follow up fro Chronic Disease Management and Care Coordination Needs )   A second unsuccessful telephone outreach was attempted today. The patient was referred to the case management team for assistance with care management and care coordination.   Follow Up Plan: The care management team will reach out to the patient again over the next 30 to 60 days.   Alto Denver RN, MSN, CCM Community Care Coordinator [place]  [facility] Mobile: [phone]

## 2021-02-07 ENCOUNTER — Other Ambulatory Visit: Payer: Self-pay | Admitting: Nurse Practitioner

## 2021-02-07 DIAGNOSIS — M5431 Sciatica, right side: Secondary | ICD-10-CM | POA: Diagnosis not present

## 2021-02-07 DIAGNOSIS — R2689 Other abnormalities of gait and mobility: Secondary | ICD-10-CM | POA: Diagnosis not present

## 2021-02-07 DIAGNOSIS — M545 Low back pain, unspecified: Secondary | ICD-10-CM | POA: Diagnosis not present

## 2021-02-07 NOTE — Telephone Encounter (Signed)
Requested Prescriptions  Pending Prescriptions Disp Refills  . atorvastatin (LIPITOR) 20 MG tablet [Pharmacy Med Name: ATORVASTATIN 20 MG TABLET] 30 tablet 0    Sig: Take 1 tablet (20 mg total) by mouth daily.     Cardiovascular:  Antilipid - Statins Failed - 02/07/2021  4:53 PM      Failed - LDL in normal range and within 360 days    LDL Chol Calc (NIH)  Date Value Ref Range Status  08/23/2020 46 0 - 99 mg/dL Final         Failed - HDL in normal range and within 360 days    HDL  Date Value Ref Range Status  08/23/2020 29 (L) >39 mg/dL Final         Failed - Triglycerides in normal range and within 360 days    Triglycerides  Date Value Ref Range Status  08/23/2020 165 (H) 0 - 149 mg/dL Final   Triglycerides Piccolo,Waived  Date Value Ref Range Status  02/29/2016 73 <150 mg/dL Final    Comment:                            Normal                   <150                         Borderline High     150 - 199                         High                200 - 499                         Very High                >499          Passed - Total Cholesterol in normal range and within 360 days    Cholesterol, Total  Date Value Ref Range Status  08/23/2020 103 100 - 199 mg/dL Final   Cholesterol Piccolo, Waived  Date Value Ref Range Status  02/29/2016 111 <200 mg/dL Final    Comment:                            Desirable                <200                         Borderline High      200- 239                         High                     >239          Passed - Patient is not pregnant      Passed - Valid encounter within last 12 months    Recent Outpatient Visits          3 weeks ago Weakness of right lower extremity   [facility] [place], [name], NP   2 months ago Controlled type 2 diabetes with neuropathy (HCC)  [facility] [place], Stoy T, NP   5 months ago Controlled type 2 diabetes with neuropathy (HCC)   [facility]  [place], [name], NP   8 months ago Controlled type 2 diabetes with neuropathy (HCC)   [facility] [name], Dorie Rank, NP   11 months ago Hammer toe of right foot   [employer], [place], DO      Future Appointments            In 1 month [name], Dorie Rank, NP [facility]   In 1 month  [facility]   In 2 months [place], Cristal Deer, MD [place], [facility]           . metoprolol tartrate (LOPRESSOR) 25 MG tablet [Pharmacy Med Name: METOPROLOL TARTRATE 25 MG TAB] 90 tablet 0    Sig: Take 0.5 tablets (12.5 mg total) by mouth 2 (two) times daily.     Cardiovascular:  Beta Blockers Passed - 02/07/2021  4:53 PM      Passed - Last BP in normal range    BP Readings from Last 1 Encounters:  01/17/21 132/72         Passed - Last Heart Rate in normal range    Pulse Readings from Last 1 Encounters:  01/17/21 67         Passed - Valid encounter within last 6 months    Recent Outpatient Visits          3 weeks ago Weakness of right lower extremity   [facility] [place], [name], NP   2 months ago Controlled type 2 diabetes with neuropathy (HCC)   [facility] [name], [name], NP   5 months ago Controlled type 2 diabetes with neuropathy (HCC)   [facility] [name], [name], NP   8 months ago Controlled type 2 diabetes with neuropathy (HCC)   [facility] [name], Dorie Rank, NP   11 months ago Hammer toe of right foot   [employer], [place], DO      Future Appointments            In 1 month [name], Dorie Rank, NP [facility]   In 1 month  [facility]   In 2 months End, Cristal Deer, MD [facility], [facility]

## 2021-02-07 NOTE — Telephone Encounter (Signed)
Requested Prescriptions  Pending Prescriptions Disp Refills  . atorvastatin (LIPITOR) 20 MG tablet [Pharmacy Med Name: ATORVASTATIN 20 MG TABLET] 30 tablet 0    Sig: Take 1 tablet (20 mg total) by mouth daily.     Cardiovascular:  Antilipid - Statins Failed - 02/07/2021  4:53 PM      Failed - LDL in normal range and within 360 days    LDL Chol Calc (NIH)  Date Value Ref Range Status  08/23/2020 46 0 - 99 mg/dL Final         Failed - HDL in normal range and within 360 days    HDL  Date Value Ref Range Status  08/23/2020 29 (L) >39 mg/dL Final         Failed - Triglycerides in normal range and within 360 days    Triglycerides  Date Value Ref Range Status  08/23/2020 165 (H) 0 - 149 mg/dL Final   Triglycerides Piccolo,Waived  Date Value Ref Range Status  02/29/2016 73 <150 mg/dL Final    Comment:                            Normal                   <150                         Borderline High     150 - 199                         High                200 - 499                         Very High                >499          Passed - Total Cholesterol in normal range and within 360 days    Cholesterol, Total  Date Value Ref Range Status  08/23/2020 103 100 - 199 mg/dL Final   Cholesterol Piccolo, Waived  Date Value Ref Range Status  02/29/2016 111 <200 mg/dL Final    Comment:                            Desirable                <200                         Borderline High      200- 239                         High                     >239          Passed - Patient is not pregnant      Passed - Valid encounter within last 12 months    Recent Outpatient Visits          3 weeks ago Weakness of right lower extremity   [facility] [place], [name], NP   2 months ago Controlled type 2 diabetes with neuropathy (HCC)  [facility] [place], Blanchard T, NP   5 months ago Controlled type 2 diabetes with neuropathy (HCC)   [facility]  [place], [name], NP   8 months ago Controlled type 2 diabetes with neuropathy (HCC)   [facility] [name], Dorie Rank, NP   11 months ago Hammer toe of right foot   [employer], [place], DO      Future Appointments            In 1 month [name], Dorie Rank, NP [facility]   In 1 month  [facility]   In 2 months End, Cristal Deer, MD [facility], [facility]           . metoprolol tartrate (LOPRESSOR) 25 MG tablet [Pharmacy Med Name: METOPROLOL TARTRATE 25 MG TAB] 30 tablet 0    Sig: Take 0.5 tablets (12.5 mg total) by mouth 2 (two) times daily.     Cardiovascular:  Beta Blockers Passed - 02/07/2021  4:53 PM      Passed - Last BP in normal range    BP Readings from Last 1 Encounters:  01/17/21 132/72         Passed - Last Heart Rate in normal range    Pulse Readings from Last 1 Encounters:  01/17/21 67         Passed - Valid encounter within last 6 months    Recent Outpatient Visits          3 weeks ago Weakness of right lower extremity   [facility] [place], [name], NP   2 months ago Controlled type 2 diabetes with neuropathy (HCC)   [facility] [name], [name], NP   5 months ago Controlled type 2 diabetes with neuropathy (HCC)   [facility] [name], [name], NP   8 months ago Controlled type 2 diabetes with neuropathy (HCC)   [facility] [name], Dorie Rank, NP   11 months ago Hammer toe of right foot   [employer], [place], DO      Future Appointments            In 1 month [name], Dorie Rank, NP [facility]   In 1 month  [facility]   In 2 months End, Cristal Deer, MD [facility], [facility]

## 2021-02-14 DIAGNOSIS — R2689 Other abnormalities of gait and mobility: Secondary | ICD-10-CM | POA: Diagnosis not present

## 2021-02-14 DIAGNOSIS — M545 Low back pain, unspecified: Secondary | ICD-10-CM | POA: Diagnosis not present

## 2021-02-14 DIAGNOSIS — M5431 Sciatica, right side: Secondary | ICD-10-CM | POA: Diagnosis not present

## 2021-02-21 DIAGNOSIS — R2689 Other abnormalities of gait and mobility: Secondary | ICD-10-CM | POA: Diagnosis not present

## 2021-02-21 DIAGNOSIS — M5431 Sciatica, right side: Secondary | ICD-10-CM | POA: Diagnosis not present

## 2021-02-21 DIAGNOSIS — M545 Low back pain, unspecified: Secondary | ICD-10-CM | POA: Diagnosis not present

## 2021-02-22 ENCOUNTER — Ambulatory Visit: Payer: Medicare HMO | Admitting: Podiatry

## 2021-02-22 ENCOUNTER — Other Ambulatory Visit: Payer: Self-pay

## 2021-02-22 ENCOUNTER — Encounter: Payer: Self-pay | Admitting: Podiatry

## 2021-02-22 DIAGNOSIS — M722 Plantar fascial fibromatosis: Secondary | ICD-10-CM

## 2021-02-22 DIAGNOSIS — I739 Peripheral vascular disease, unspecified: Secondary | ICD-10-CM

## 2021-02-22 DIAGNOSIS — E1142 Type 2 diabetes mellitus with diabetic polyneuropathy: Secondary | ICD-10-CM

## 2021-02-22 NOTE — Progress Notes (Signed)
He presents today for his diabetic foot exam states that he has some pain with his feet in the mornings but really not bad still continues to take the gabapentin as directed he is doing [facility] on his legs at this point time and feels that they are little bit stiff possibly from that.  Objective: Vital signs are stable he is alert oriented x3 pulses are palpable.  Neurologic sensorium is intact deep to reflexes are intact muscle strength is normal symmetrical.  Orthopedic evaluation demonstrates all joints distal to the ankle full range of motion without crepitation.  Cutaneous evaluation demonstrates supple well-hydrated cutis no erythema edema cellulitis drainage or odor no open lesions or wounds are noted.  Assessment: Diabetes with diabetic peripheral neuropathy.  Plan: At this point I highly recommended that he follow-up with [place] in 6 months for new pair diabetic shoes and should his tenderness in his forefoot continue it we may need to consider treating his history of plantar fasciitis we will more aggressively.

## 2021-02-28 DIAGNOSIS — R2689 Other abnormalities of gait and mobility: Secondary | ICD-10-CM | POA: Diagnosis not present

## 2021-02-28 DIAGNOSIS — M545 Low back pain, unspecified: Secondary | ICD-10-CM | POA: Diagnosis not present

## 2021-02-28 DIAGNOSIS — M5431 Sciatica, right side: Secondary | ICD-10-CM | POA: Diagnosis not present

## 2021-03-07 ENCOUNTER — Telehealth: Payer: Self-pay | Admitting: *Deleted

## 2021-03-07 NOTE — Chronic Care Management (AMB) (Signed)
  Care Management   Note  03/07/2021 Name: Lawerance Matsuo MRN: [id number] DOB: [birth year]  Gordy Goar is a 70 y.o. year old male who is a primary care patient of Marjie Skiff, NP and is actively engaged with the care management team. I reached out to [name] by phone today to assist with re-scheduling a follow up visit with the RN Case Manager  Follow up plan: Unsuccessful telephone outreach attempt made. The care management team will reach out to the patient again over the next 7 days.  If patient returns call to provider office, please advise to call Embedded Care Management Care Guide Avie Arenas at [phone]  Novelle Addair [facility] Guide, Embedded Care Coordination [facility]  Care Management

## 2021-03-07 NOTE — Telephone Encounter (Signed)
Please reschedule with RN CM  

## 2021-03-08 ENCOUNTER — Ambulatory Visit: Payer: Medicare Other | Admitting: Internal Medicine

## 2021-03-13 ENCOUNTER — Other Ambulatory Visit: Payer: Self-pay | Admitting: Nurse Practitioner

## 2021-03-13 DIAGNOSIS — J342 Deviated nasal septum: Secondary | ICD-10-CM | POA: Diagnosis not present

## 2021-03-13 DIAGNOSIS — J301 Allergic rhinitis due to pollen: Secondary | ICD-10-CM | POA: Diagnosis not present

## 2021-03-13 NOTE — Telephone Encounter (Signed)
Patient last seen 12/06/20 and has f/up on 03/17/21

## 2021-03-13 NOTE — Chronic Care Management (AMB) (Signed)
  Care Management   Note  03/13/2021 Name: Rembert Browe MRN: [id number] DOB: 05-Oct-1951  Lance Huaracha is a 70 y.o. year old male who is a primary care patient of Marjie Skiff, NP and is actively engaged with the care management team. I reached out to [name] by phone today to assist with re-scheduling a follow up visit with the RN Case Manager  Follow up plan: Unsuccessful telephone outreach attempt made.  The care management team will reach out to the patient again over the next 7 days.  If patient returns call to provider office, please advise to call Embedded Care Management Care Guide Avie Arenas at [phone]   Vic Esco [facility] Guide, Embedded Care Coordination [facility]  Care Management

## 2021-03-13 NOTE — Telephone Encounter (Signed)
Requested medication (s) are due for refill today: Yes  Requested medication (s) are on the active medication list: Yes  Last refill:  11/23/19  Future visit scheduled: Yes  Notes to clinic:  Unable to refill per protocol, Rx expired.      Requested Prescriptions  Pending Prescriptions Disp Refills   amLODipine (NORVASC) 5 MG tablet [Pharmacy Med Name: AMLODIPINE BESYLATE 5 MG TAB] 90 tablet 0    Sig: Take 1 tablet (5 mg total) by mouth daily.      Cardiovascular:  Calcium Channel Blockers Passed - 03/13/2021  3:35 PM      Passed - Last BP in normal range    BP Readings from Last 1 Encounters:  01/17/21 132/72          Passed - Valid encounter within last 6 months    Recent Outpatient Visits           1 month ago Weakness of right lower extremity   [facility] [place], [name], NP   3 months ago Controlled type 2 diabetes with neuropathy (HCC)   [facility] [name], [name], NP   6 months ago Controlled type 2 diabetes with neuropathy (HCC)   [facility] [name], [name], NP   9 months ago Controlled type 2 diabetes with neuropathy (HCC)   [facility] [place], Dorie Rank, NP   1 year ago Hammer toe of right foot   [employer], [place], DO       Future Appointments             In 4 days [name], Dorie Rank, NP [facility]   In 3 weeks  [facility]   In 1 month End, Cristal Deer, MD [facility] [place], [facility]              Signed Prescriptions Disp Refills   metoprolol tartrate (LOPRESSOR) 25 MG tablet 90 tablet 1    Sig: Take 0.5 tablets (12.5 mg total) by mouth 2 (two) times daily.      Cardiovascular:  Beta Blockers Passed - 03/13/2021  3:35 PM      Passed - Last BP in normal range    BP Readings from Last 1 Encounters:  01/17/21 132/72          Passed - Last Heart Rate in normal range    Pulse Readings from Last 1 Encounters:   01/17/21 67          Passed - Valid encounter within last 6 months    Recent Outpatient Visits           1 month ago Weakness of right lower extremity   [facility] [place], [name], NP   3 months ago Controlled type 2 diabetes with neuropathy (HCC)   [facility] [name], [name], NP   6 months ago Controlled type 2 diabetes with neuropathy (HCC)   [facility] [name], [name], NP   9 months ago Controlled type 2 diabetes with neuropathy (HCC)   [facility] [place], Dorie Rank, NP   1 year ago Hammer toe of right foot   [employer], [place] P, DO       Future Appointments             In 4 days [name], Dorie Rank, NP [facility]   In 3 weeks  [facility]   In 1  month End, Cristal Deer, MD [facility], [facility]               atorvastatin (LIPITOR) 20 MG tablet 90 tablet 1    Sig: Take 1 tablet (20 mg total) by mouth daily.      Cardiovascular:  Antilipid - Statins Failed - 03/13/2021  3:35 PM      Failed - LDL in normal range and within 360 days    LDL Chol Calc (NIH)  Date Value Ref Range Status  08/23/2020 46 0 - 99 mg/dL Final          Failed - HDL in normal range and within 360 days    HDL  Date Value Ref Range Status  08/23/2020 29 (L) >39 mg/dL Final          Failed - Triglycerides in normal range and within 360 days    Triglycerides  Date Value Ref Range Status  08/23/2020 165 (H) 0 - 149 mg/dL Final   Triglycerides Piccolo,Waived  Date Value Ref Range Status  02/29/2016 73 <150 mg/dL Final    Comment:                            Normal                   <150                         Borderline High     150 - 199                         High                200 - 499                         Very High                >499           Passed - Total Cholesterol in normal range and within 360 days    Cholesterol, Total  Date  Value Ref Range Status  08/23/2020 103 100 - 199 mg/dL Final   Cholesterol Piccolo, Waived  Date Value Ref Range Status  02/29/2016 111 <200 mg/dL Final    Comment:                            Desirable                <200                         Borderline High      200- 239                         High                     >239           Passed - Patient is not pregnant      Passed - Valid encounter within last 12 months    Recent Outpatient Visits           1 month ago Weakness of right lower extremity   [facility] [place], [place] T,  NP   3 months ago Controlled type 2 diabetes with neuropathy (HCC)   [facility] [place], [name], NP   6 months ago Controlled type 2 diabetes with neuropathy (HCC)   [facility] [name], [name], NP   9 months ago Controlled type 2 diabetes with neuropathy (HCC)   [facility] [place], Dorie Rank, NP   1 year ago Hammer toe of right foot   [employer], [place] P, DO       Future Appointments             In 4 days [name], Dorie Rank, NP [facility]   In 3 weeks  [facility]   In 1 month End, Cristal Deer, MD [facility], [facility]

## 2021-03-17 ENCOUNTER — Encounter: Payer: Self-pay | Admitting: Nurse Practitioner

## 2021-03-17 ENCOUNTER — Other Ambulatory Visit: Payer: Self-pay

## 2021-03-17 ENCOUNTER — Ambulatory Visit (INDEPENDENT_AMBULATORY_CARE_PROVIDER_SITE_OTHER): Payer: Medicare HMO | Admitting: Nurse Practitioner

## 2021-03-17 VITALS — BP 128/76 | HR 58 | Temp 97.9°F | Wt 206.4 lb

## 2021-03-17 DIAGNOSIS — E538 Deficiency of other specified B group vitamins: Secondary | ICD-10-CM | POA: Diagnosis not present

## 2021-03-17 DIAGNOSIS — I5032 Chronic diastolic (congestive) heart failure: Secondary | ICD-10-CM | POA: Diagnosis not present

## 2021-03-17 DIAGNOSIS — Z87891 Personal history of nicotine dependence: Secondary | ICD-10-CM

## 2021-03-17 DIAGNOSIS — E1169 Type 2 diabetes mellitus with other specified complication: Secondary | ICD-10-CM | POA: Diagnosis not present

## 2021-03-17 DIAGNOSIS — E1140 Type 2 diabetes mellitus with diabetic neuropathy, unspecified: Secondary | ICD-10-CM

## 2021-03-17 DIAGNOSIS — I2510 Atherosclerotic heart disease of native coronary artery without angina pectoris: Secondary | ICD-10-CM

## 2021-03-17 DIAGNOSIS — R29898 Other symptoms and signs involving the musculoskeletal system: Secondary | ICD-10-CM | POA: Diagnosis not present

## 2021-03-17 DIAGNOSIS — J432 Centrilobular emphysema: Secondary | ICD-10-CM | POA: Diagnosis not present

## 2021-03-17 DIAGNOSIS — E785 Hyperlipidemia, unspecified: Secondary | ICD-10-CM

## 2021-03-17 DIAGNOSIS — Z125 Encounter for screening for malignant neoplasm of prostate: Secondary | ICD-10-CM | POA: Diagnosis not present

## 2021-03-17 DIAGNOSIS — I152 Hypertension secondary to endocrine disorders: Secondary | ICD-10-CM | POA: Diagnosis not present

## 2021-03-17 DIAGNOSIS — I700 Atherosclerosis of aorta: Secondary | ICD-10-CM

## 2021-03-17 DIAGNOSIS — E1159 Type 2 diabetes mellitus with other circulatory complications: Secondary | ICD-10-CM

## 2021-03-17 DIAGNOSIS — I77810 Thoracic aortic ectasia: Secondary | ICD-10-CM

## 2021-03-17 DIAGNOSIS — Z6825 Body mass index (BMI) 25.0-25.9, adult: Secondary | ICD-10-CM

## 2021-03-17 LAB — MICROALBUMIN, URINE WAIVED
Creatinine, Urine Waived: 200 mg/dL (ref 10–300)
Microalb, Ur Waived: 10 mg/L (ref 0–19)
Microalb/Creat Ratio: <30 mg/g (ref <30)

## 2021-03-17 LAB — BAYER DCA HB A1C WAIVED: HB A1C (BAYER DCA - WAIVED): 7.1 % — ABNORMAL HIGH (ref <7.0)

## 2021-03-17 NOTE — Assessment & Plan Note (Signed)
Chronic, stable.  Continue collaboration with cardiology and current medication regimen as ordered by them.  Discussed with him if ongoing elevation A1c next visit will consider addition of SLGT2.  Recommend: - Reminded to call for an overnight weight gain of >2 pounds or a weekly weight weight of >5 pounds - not adding salt to his food and has been reading food labels. Reviewed the importance of keeping daily sodium intake to 2000mg  daily

## 2021-03-17 NOTE — Assessment & Plan Note (Signed)
Followed by cardiology, continue this collaboration. ?

## 2021-03-17 NOTE — Assessment & Plan Note (Signed)
Chronic, ongoing.  Continue current medication regimen and adjust as needed.  Lipid panel today.  Recent LDL <70. 

## 2021-03-17 NOTE — Patient Instructions (Signed)
Diabetes Mellitus and Nutrition, Adult When you have diabetes, or diabetes mellitus, it is very important to have healthy eating habits because your blood sugar (glucose) levels are greatly affected by what you eat and drink. Eating healthy foods in the right amounts, at about the same times every day, can help you:  Control your blood glucose.  Lower your risk of heart disease.  Improve your blood pressure.  Reach or maintain a healthy weight. What can affect my meal plan? Every person with diabetes is different, and each person has different needs for a meal plan. Your health care provider may recommend that you work with a dietitian to make a meal plan that is best for you. Your meal plan may vary depending on factors such as:  The calories you need.  The medicines you take.  Your weight.  Your blood glucose, blood pressure, and cholesterol levels.  Your activity level.  Other health conditions you have, such as heart or kidney disease. How do carbohydrates affect me? Carbohydrates, also called carbs, affect your blood glucose level more than any other type of food. Eating carbs naturally raises the amount of glucose in your blood. Carb counting is a method for keeping track of how many carbs you eat. Counting carbs is important to keep your blood glucose at a healthy level, especially if you use insulin or take certain oral diabetes medicines. It is important to know how many carbs you can safely have in each meal. This is different for every person. Your dietitian can help you calculate how many carbs you should have at each meal and for each snack. How does alcohol affect me? Alcohol can cause a sudden decrease in blood glucose (hypoglycemia), especially if you use insulin or take certain oral diabetes medicines. Hypoglycemia can be a life-threatening condition. Symptoms of hypoglycemia, such as sleepiness, dizziness, and confusion, are similar to symptoms of having too much  alcohol.  Do not drink alcohol if: ? Your health care provider tells you not to drink. ? You are pregnant, may be pregnant, or are planning to become pregnant.  If you drink alcohol: ? Do not drink on an empty stomach. ? Limit how much you use to:  0-1 drink a day for women.  0-2 drinks a day for men. ? Be aware of how much alcohol is in your drink. In the [place] one drink equals one 12 oz bottle of beer (355 mL), one 5 oz glass of wine (148 mL), or one 1 oz glass of hard liquor (44 mL). ? Keep yourself hydrated with water, diet soda, or unsweetened iced tea.  Keep in mind that regular soda, juice, and other mixers may contain a lot of sugar and must be counted as carbs. What are tips for following this plan? Reading food labels  Start by checking the serving size on the "Nutrition Facts" label of packaged foods and drinks. The amount of calories, carbs, fats, and other nutrients listed on the label is based on one serving of the item. Many items contain more than one serving per package.  Check the total grams (g) of carbs in one serving. You can calculate the number of servings of carbs in one serving by dividing the total carbs by 15. For example, if a food has 30 g of total carbs per serving, it would be equal to 2 servings of carbs.  Check the number of grams (g) of saturated fats and trans fats in one serving. Choose foods that have   a low amount or none of these fats.  Check the number of milligrams (mg) of salt (sodium) in one serving. Most people should limit total sodium intake to less than 2,300 mg per day.  Always check the nutrition information of foods labeled as "low-fat" or "nonfat." These foods may be higher in added sugar or refined carbs and should be avoided.  Talk to your dietitian to identify your daily goals for nutrients listed on the label. Shopping  Avoid buying canned, pre-made, or processed foods. These foods tend to be high in fat, sodium, and added  sugar.  Shop around the outside edge of the grocery store. This is where you will most often find fresh fruits and vegetables, bulk grains, fresh meats, and fresh dairy. Cooking  Use low-heat cooking methods, such as baking, instead of high-heat cooking methods like deep frying.  Cook using healthy oils, such as olive, canola, or sunflower oil.  Avoid cooking with butter, cream, or high-fat meats. Meal planning  Eat meals and snacks regularly, preferably at the same times every day. Avoid going long periods of time without eating.  Eat foods that are high in fiber, such as fresh fruits, vegetables, beans, and whole grains. Talk with your dietitian about how many servings of carbs you can eat at each meal.  Eat 4-6 oz (112-168 g) of lean protein each day, such as lean meat, chicken, fish, eggs, or tofu. One ounce (oz) of lean protein is equal to: ? 1 oz (28 g) of meat, chicken, or fish. ? 1 egg. ?  cup (62 g) of tofu.  Eat some foods each day that contain healthy fats, such as avocado, nuts, seeds, and fish.   What foods should I eat? Fruits Berries. Apples. Oranges. Peaches. Apricots. Plums. Grapes. Mango. Papaya. Pomegranate. Kiwi. Cherries. Vegetables Lettuce. Spinach. Leafy greens, including kale, chard, collard greens, and mustard greens. Beets. Cauliflower. Cabbage. Broccoli. Carrots. Green beans. Tomatoes. Peppers. Onions. Cucumbers. Brussels sprouts. Grains Whole grains, such as whole-wheat or whole-grain bread, crackers, tortillas, cereal, and pasta. Unsweetened oatmeal. Quinoa. Brown or wild rice. Meats and other proteins Seafood. Poultry without skin. Lean cuts of poultry and beef. Tofu. Nuts. Seeds. Dairy Low-fat or fat-free dairy products such as milk, yogurt, and cheese. The items listed above may not be a complete list of foods and beverages you can eat. Contact a dietitian for more information. What foods should I avoid? Fruits Fruits canned with  syrup. Vegetables Canned vegetables. Frozen vegetables with butter or cream sauce. Grains Refined white flour and flour products such as bread, pasta, snack foods, and cereals. Avoid all processed foods. Meats and other proteins Fatty cuts of meat. Poultry with skin. Breaded or fried meats. Processed meat. Avoid saturated fats. Dairy Full-fat yogurt, cheese, or milk. Beverages Sweetened drinks, such as soda or iced tea. The items listed above may not be a complete list of foods and beverages you should avoid. Contact a dietitian for more information. Questions to ask a health care provider  Do I need to meet with a diabetes educator?  Do I need to meet with a dietitian?  What number can I call if I have questions?  When are the best times to check my blood glucose? Where to find more information:  American Diabetes Association: diabetes.org  Academy of Nutrition and Dietetics: www.eatright.org  National Institute of Diabetes and Digestive and Kidney Diseases: www.niddk.nih.gov  Association of Diabetes Care and Education Specialists: www.diabeteseducator.org Summary  It is important to have healthy eating   habits because your blood sugar (glucose) levels are greatly affected by what you eat and drink.  A healthy meal plan will help you control your blood glucose and maintain a healthy lifestyle.  Your health care provider may recommend that you work with a dietitian to make a meal plan that is best for you.  Keep in mind that carbohydrates (carbs) and alcohol have immediate effects on your blood glucose levels. It is important to count carbs and to use alcohol carefully. This information is not intended to replace advice given to you by your health care provider. Make sure you discuss any questions you have with your health care provider. Document Revised: 11/24/2019 Document Reviewed: 11/24/2019 Elsevier Patient Education  2021 Elsevier Inc.  

## 2021-03-17 NOTE — Assessment & Plan Note (Signed)
Chronic, ongoing with BP at goal today and at goal on home readings.  Continue current medication regimen and adjust as needed.  Continue collaboration with cardiology.  Recommend he monitor BP at least a few times a week at home and document.  DASH diet focus.  Labs: CMP today.  Return in 3 months.

## 2021-03-17 NOTE — Assessment & Plan Note (Signed)
Chronic, ongoing.  A1C 7.1%, increase from previous. Urine ALB 10 and A:C <30 today. Continue current medication regimen and checking BS at home daily.  Discussed addition of Jardiance or Farxiga, which would offer heart benefit, but he wishes to hold off at this time.  Return in 3 months for follow-up, if elevation A1C add on SGLT2.  Continue Gabapentin as prescribed by podiatry and adjust as needed.  Return in 3 months.

## 2021-03-17 NOTE — Assessment & Plan Note (Signed)
Noted on CT imaging.  Continue ASA and Lipitor daily for prevention.  Quit smoking 2 years ago.   °

## 2021-03-17 NOTE — Assessment & Plan Note (Signed)
Recommended eating smaller high protein, low fat meals more frequently and exercising 30 mins a day 5 times a week with a goal of 10-15lb weight loss in the next 3 months. Patient voiced their understanding and motivation to adhere to these recommendations.  

## 2021-03-17 NOTE — Assessment & Plan Note (Signed)
Ongoing, continue supplement.  Recheck level today. 

## 2021-03-17 NOTE — Chronic Care Management (AMB) (Signed)
  Chronic Care Management   Outreach Note  03/17/2021 Name: Brendan [name] MRN: [id number] DOB: 1951-11-15  Brendan [name] is a 70 y.o. year old male who is a primary care patient of [name], Dorie Rank, NP. I reached out to Brendan [name] by phone today in response to a referral sent by [name]'s PCP, Brendan Skiff, NP     An unsuccessful telephone outreach was attempted today. The patient was referred to the case management team for assistance with care management and care coordination.   Follow Up Plan: Unsuccessful telephone outreach attempt made. Unable to make contact on outreach attempts x 3  PCP [name],Jolene NP notified via routed documentation in medical record.  We have been unable to make contact with the patient for follow up. The care management team is available to follow up with the patient after provider conversation with the patient regarding recommendation for care management engagement and subsequent re-referral to the care management team.    [facility] Guide, Embedded Care Coordination [facility]  Care Management

## 2021-03-17 NOTE — Assessment & Plan Note (Signed)
Continue annual lung screening.  Recommend continued cessation.

## 2021-03-17 NOTE — Assessment & Plan Note (Signed)
Continue collaboration with cardiology.  

## 2021-03-17 NOTE — Assessment & Plan Note (Signed)
Chronic, stable with minimal use of Albuterol.  PFT recently noting minimal change from previous with FEV1 62% and FEV1/FVC 72% in 2021, repeat next visit.  Continue to monitor and adjust regimen as needed.  Continue Albuterol and recommend he use this if wheezing present or SOB.  Continue cessation of smoking and continue yearly lung screening.

## 2021-03-17 NOTE — Assessment & Plan Note (Signed)
Improving at this time with physical therapy, had degenerative disc noted.

## 2021-03-17 NOTE — Progress Notes (Signed)
BP 128/76 (BP Location: Left Arm)   Pulse (!) 58   Temp 97.9 F (36.6 C) (Oral)   Wt 206 lb 6.4 oz (93.6 kg)   SpO2 93%   BMI 26.43 kg/m    Subjective:    Patient ID: Brendan [name], male    DOB: 1951/01/02, 70 y.o.   MRN: [id number]  HPI: Brendan [name] is a 70 y.o. male  Chief Complaint  Patient presents with  . Diabetes  . Hypertension  . Hyperlipidemia  . Extremity Weakness    Patient states his right leg has since gotten better. He has been taking therapy and notices a difference.   DIABETES Last A1C 7%. Continues on Metformin 1000 MG BID. Was started on Gabapentin 300 MG QHS on 05/09/20 by podiatry for neuropathy. Eating a little more potato chips recently.   Hypoglycemic episodes:no Polydipsia/polyuria: no Visual disturbance: no Chest pain: no Paresthesias: no Glucose Monitoring: yes             Accucheck frequency: Daily             Fasting glucose: 110 to 120 range              Post prandial:              Evening:             Before meals: Taking Insulin?: no             Long acting insulin:             Short acting insulin: Blood Pressure Monitoring: not checking Retinal Examination: Up To Date -- 10/03/20 Foot Exam: Up to Date Pneumovax: Up to Date Influenza: Up to Date Aspirin: yes   HYPERTENSION / HYPERLIPIDEMIA Continues on Metoprolol, Lasix, Hygroton, Amlodipine, K+, and Lipitor. Followed by cardiology and last seen by Dr. Okey Dupre on 09/07/20 with Lasix changed to PRN -- although he reports taking it every day due to ankle edema. Allergic to Lisinopril. Aortic atherosclerosis noted on recent lung CT screening.  Last echo showed EF 60-65% in April 2021. Had repeat testing wall motion testing on 05/02/20. Satisfied with current treatment? yes Duration of hypertension: chronic BP monitoring frequency: a few days a week BP range: 110-120/70's range -- 120/79 yesterday BP medication side effects: no Duration of hyperlipidemia: chronic Cholesterol medication side  effects: no Cholesterol supplements: none Medication compliance: good compliance Aspirin: yes Recent stressors: no Recurrent headaches: no Visual changes: no Palpitations: no Dyspnea: no Chest pain: no Lower extremity edema: no Dizzy/lightheaded: no   COPD Has not smoked in 3 years since his bypass surgery.  Was Covid positive 12/30/2019, recovered well. FEV1 62% and FEV1/FVC 72% in May 2021.  Has Singulair he takes daily.  Has Albuterol inhaler, but does not use. Last Lung CT screening 07/01/20 -- noted aortic atherosclerosis which was noted on past imaging and has been discussed with patient + emphysema and hepatic steatosis.   COPD status: stable Satisfied with current treatment?: yes Oxygen use: no Dyspnea frequency: none Cough frequency: none Rescue inhaler frequency: x once or twice a year, sometimes less Limitation of activity: no Productive cough: none Last Spirometry: May 2021 Pneumovax: Up to Date Influenza: Up to Date   LOWER EXTREMITY WEAKNESS Reports lower extremity weakness ongoing for years R>L with recent worsening, slowly getting weaker -- went to physical thereapy for 5 weeks recently and he reports improvement in strength.  Had imaging 12/13/20 with degenerative disc noted.  History of cartilage taken  out of right knee in early 80's, playing basketball.  Did not go to therapy back then.  Has occasional pain to back and right knee, but not often.  Has diabetic shoes, denies difficulty with walking.  Last B12 level 359 -- taking supplement daily.    Relevant past medical, surgical, family and social history reviewed and updated as indicated. Interim medical history since our last visit reviewed. Allergies and medications reviewed and updated.  Review of Systems  Constitutional: Negative for activity change, diaphoresis, fatigue and fever.  Respiratory: Negative for cough, chest tightness, shortness of breath and wheezing.   Cardiovascular: Negative for chest pain,  palpitations and leg swelling.  Gastrointestinal: Negative.   Endocrine: Negative for cold intolerance, heat intolerance, polydipsia, polyphagia and polyuria.  Neurological: Positive for weakness (improving). Negative for dizziness, syncope, light-headedness, numbness and headaches.  Psychiatric/Behavioral: Negative.    Per HPI unless specifically indicated above     Objective:    BP 128/76 (BP Location: Left Arm)   Pulse (!) 58   Temp 97.9 F (36.6 C) (Oral)   Wt 206 lb 6.4 oz (93.6 kg)   SpO2 93%   BMI 26.43 kg/m   Wt Readings from Last 3 Encounters:  03/17/21 206 lb 6.4 oz (93.6 kg)  01/17/21 205 lb 9.6 oz (93.3 kg)  12/06/20 205 lb (93 kg)    Physical Exam Vitals and nursing note reviewed.  Constitutional:      General: He is awake. He is not in acute distress.    Appearance: He is well-developed and overweight. He is not ill-appearing.  HENT:     Head: Normocephalic and atraumatic.     Right Ear: Hearing normal. No drainage.     Left Ear: Hearing normal. No drainage.  Eyes:     General: Lids are normal.        Right eye: No discharge.        Left eye: No discharge.     Conjunctiva/sclera: Conjunctivae normal.     Pupils: Pupils are equal, round, and reactive to light.  Neck:     Vascular: No carotid bruit.  Cardiovascular:     Rate and Rhythm: Normal rate and regular rhythm.     Pulses:          Dorsalis pedis pulses are 2+ on the right side and 2+ on the left side.       Posterior tibial pulses are 2+ on the right side and 2+ on the left side.     Heart sounds: Normal heart sounds, S1 normal and S2 normal. No murmur heard. No gallop.   Pulmonary:     Effort: Pulmonary effort is normal. No accessory muscle usage or respiratory distress.     Breath sounds: Normal breath sounds.  Abdominal:     General: Bowel sounds are normal.     Palpations: Abdomen is soft.     Tenderness: There is no abdominal tenderness.  Musculoskeletal:        General: Normal range of  motion.     Cervical back: Normal range of motion and neck supple.     Right lower leg: No tenderness. No edema.     Left lower leg: No tenderness. No edema.     Right foot: Normal range of motion.     Left foot: Normal range of motion.     Comments: Strength much improved bilateral lower extremities.  Feet:     Right foot:     Protective Sensation: 7 sites  tested. 10 sites sensed.     Skin integrity: Skin integrity normal.     Left foot:     Protective Sensation: 7 sites tested. 10 sites sensed.     Skin integrity: Skin integrity normal.  Skin:    General: Skin is warm and dry.  Neurological:     Mental Status: He is alert and oriented to person, place, and time.  Psychiatric:        Attention and Perception: Attention normal.        Mood and Affect: Mood normal.        Speech: Speech normal.        Behavior: Behavior normal. Behavior is cooperative.    Results for orders placed or performed in visit on 12/06/20  Bayer DCA Hb A1c Waived  Result Value Ref Range   HB A1C (BAYER DCA - WAIVED) 7.0 (H) <7.0 %      Assessment & Plan:   Problem List Items Addressed This Visit      Cardiovascular and Mediastinum   Hypertension associated with diabetes (HCC)    Chronic, ongoing with BP at goal today and at goal on home readings.  Continue current medication regimen and adjust as needed.  Continue collaboration with cardiology.  Recommend he monitor BP at least a few times a week at home and document.  DASH diet focus.  Labs: CMP today.  Return in 3 months.      Relevant Orders   Microalbumin, Urine Waived   Comprehensive metabolic panel   Coronary artery disease    Continue collaboration with cardiology.      Aortic atherosclerosis (HCC)    Noted on CT imaging.  Continue ASA and Lipitor daily for prevention.  Quit smoking 2 years ago.        Chronic heart failure with preserved ejection fraction (HFpEF) (HCC)    Chronic, stable.  Continue collaboration with cardiology and  current medication regimen as ordered by them.  Discussed with him if ongoing elevation A1c next visit will consider addition of SLGT2.  Recommend: - Reminded to call for an overnight weight gain of >2 pounds or a weekly weight weight of >5 pounds - not adding salt to his food and has been reading food labels. Reviewed the importance of keeping daily sodium intake to 2000mg  daily        Dilated aortic root (HCC)    Followed by cardiology, continue this collaboration.        Respiratory   Centrilobular emphysema (HCC)    Chronic, stable with minimal use of Albuterol.  PFT recently noting minimal change from previous with FEV1 62% and FEV1/FVC 72% in 2021, repeat next visit.  Continue to monitor and adjust regimen as needed.  Continue Albuterol and recommend he use this if wheezing present or SOB.  Continue cessation of smoking and continue yearly lung screening.        Endocrine   Controlled type 2 diabetes with neuropathy (HCC) - Primary    Chronic, ongoing.  A1C 7.1%, increase from previous. Urine ALB 10 and A:C <30 today. Continue current medication regimen and checking BS at home daily.  Discussed addition of Jardiance or Farxiga, which would offer heart benefit, but he wishes to hold off at this time.  Return in 3 months for follow-up, if elevation A1C add on SGLT2.  Continue Gabapentin as prescribed by podiatry and adjust as needed.  Return in 3 months.      Relevant Orders   [facility]  Hb A1c Waived   Microalbumin, Urine Waived   Hyperlipidemia associated with type 2 diabetes mellitus (HCC)    Chronic, ongoing.  Continue current medication regimen and adjust as needed.  Lipid panel today.  Recent LDL <70.      Relevant Orders   Comprehensive metabolic panel   Lipid Panel w/o Chol/HDL Ratio     Nervous and Auditory   Weakness of right lower extremity    Improving at this time with physical therapy, had degenerative disc noted.        Other   Personal history of tobacco  use, presenting hazards to health    Continue annual lung screening.  Recommend continued cessation.      BMI 25.0-25.9,adult    Recommended eating smaller high protein, low fat meals more frequently and exercising 30 mins a day 5 times a week with a goal of 10-15lb weight loss in the next 3 months. Patient voiced their understanding and motivation to adhere to these recommendations.       B12 deficiency    Ongoing, continue supplement.  Recheck level today.      Relevant Orders   Vitamin B12    Other Visit Diagnoses    Prostate cancer screening       PSA on labs today.   Relevant Orders   PSA       Follow up plan: Return in about 3 months (around 06/17/2021) for T2DM, HTN/HLD, COPD, BPH -- spirometry needed.

## 2021-03-18 LAB — LIPID PANEL W/O CHOL/HDL RATIO
Cholesterol, Total: 126 mg/dL (ref 100–199)
HDL: 33 mg/dL — ABNORMAL LOW (ref >39)
LDL Chol Calc (NIH): 65 mg/dL (ref 0–99)
Triglycerides: 162 mg/dL — ABNORMAL HIGH (ref 0–149)
VLDL Cholesterol Cal: 28 mg/dL (ref 5–40)

## 2021-03-18 LAB — COMPREHENSIVE METABOLIC PANEL
ALT: 31 IU/L (ref 0–44)
AST: 26 IU/L (ref 0–40)
Albumin/Globulin Ratio: 3.1 — ABNORMAL HIGH (ref 1.2–2.2)
Albumin: 5.2 g/dL — ABNORMAL HIGH (ref 3.8–4.8)
Alkaline Phosphatase: 56 IU/L (ref 44–121)
BUN/Creatinine Ratio: 16 (ref 10–24)
BUN: 20 mg/dL (ref 8–27)
Bilirubin Total: 0.3 mg/dL (ref 0.0–1.2)
CO2: 24 mmol/L (ref 20–29)
Calcium: 10.2 mg/dL (ref 8.6–10.2)
Chloride: 100 mmol/L (ref 96–106)
Creatinine, Ser: 1.25 mg/dL (ref 0.76–1.27)
Globulin, Total: 1.7 g/dL (ref 1.5–4.5)
Glucose: 109 mg/dL — ABNORMAL HIGH (ref 65–99)
Potassium: 3.9 mmol/L (ref 3.5–5.2)
Sodium: 143 mmol/L (ref 134–144)
Total Protein: 6.9 g/dL (ref 6.0–8.5)
eGFR: 62 mL/min/{1.73_m2} (ref >59)

## 2021-03-18 LAB — VITAMIN B12: Vitamin B-12: 1720 pg/mL — ABNORMAL HIGH (ref 232–1245)

## 2021-03-18 LAB — PSA: Prostate Specific Ag, Serum: 0.4 ng/mL (ref 0.0–4.0)

## 2021-03-18 NOTE — Progress Notes (Signed)
Good morning, please let Quatavious know his labs have returned.  Kidney and liver function remain normal.  Cholesterol levels show LDL at goal.  Prostate lab is normal.  B12 level is normal, you may reduce taking this to every other day at this time.  Any questions?  Have a great day!! Keep being awesome!!  Thank you for allowing me to participate in your care. Kindest regards, Keshonna Valvo

## 2021-03-20 NOTE — Telephone Encounter (Signed)
Not able to reach patient ,3 unsuccessful outreach sent follow up message to PCP'

## 2021-03-22 DIAGNOSIS — H35033 Hypertensive retinopathy, bilateral: Secondary | ICD-10-CM | POA: Diagnosis not present

## 2021-03-22 DIAGNOSIS — H26493 Other secondary cataract, bilateral: Secondary | ICD-10-CM | POA: Diagnosis not present

## 2021-03-22 DIAGNOSIS — E119 Type 2 diabetes mellitus without complications: Secondary | ICD-10-CM | POA: Diagnosis not present

## 2021-03-22 LAB — HM DIABETES EYE EXAM

## 2021-03-27 ENCOUNTER — Other Ambulatory Visit: Payer: Self-pay | Admitting: Nurse Practitioner

## 2021-04-03 ENCOUNTER — Ambulatory Visit: Payer: Medicare HMO

## 2021-04-04 ENCOUNTER — Telehealth: Payer: Self-pay | Admitting: Internal Medicine

## 2021-04-04 MED ORDER — FUROSEMIDE 20 MG PO TABS: 20.0000 mg | ORAL_TABLET | Freq: Every day | ORAL | 0 refills | Status: DC | PRN

## 2021-04-04 NOTE — Telephone Encounter (Signed)
Requested Prescriptions   Signed Prescriptions Disp Refills  . furosemide (LASIX) 20 MG tablet 90 tablet 0    Sig: Take 1 tablet (20 mg total) by mouth daily as needed.    Authorizing Provider: END, CHRISTOPHER    Ordering User: NEWCOMER MCCLAIN, Lujean Ebright L    

## 2021-04-04 NOTE — Telephone Encounter (Signed)
*  STAT* If patient is at the pharmacy, call can be transferred to refill team.   1. Which medications need to be refilled? (please list name of each medication and dose if known) furosemide 20 MG as needed  2. Which pharmacy/location (including street and city if local pharmacy) is medication to be sent to? [facility]  3. Do they need a 30 day or 90 day supply? 90 day

## 2021-04-13 ENCOUNTER — Other Ambulatory Visit: Payer: Self-pay

## 2021-04-13 ENCOUNTER — Ambulatory Visit: Payer: Medicare HMO | Admitting: Internal Medicine

## 2021-04-13 ENCOUNTER — Encounter: Payer: Self-pay | Admitting: Internal Medicine

## 2021-04-13 VITALS — BP 140/80 | HR 94 | Ht 75.0 in | Wt 205.0 lb

## 2021-04-13 DIAGNOSIS — N529 Male erectile dysfunction, unspecified: Secondary | ICD-10-CM | POA: Diagnosis not present

## 2021-04-13 DIAGNOSIS — I10 Essential (primary) hypertension: Secondary | ICD-10-CM

## 2021-04-13 DIAGNOSIS — E1169 Type 2 diabetes mellitus with other specified complication: Secondary | ICD-10-CM

## 2021-04-13 DIAGNOSIS — I5032 Chronic diastolic (congestive) heart failure: Secondary | ICD-10-CM

## 2021-04-13 DIAGNOSIS — I2510 Atherosclerotic heart disease of native coronary artery without angina pectoris: Secondary | ICD-10-CM

## 2021-04-13 DIAGNOSIS — E785 Hyperlipidemia, unspecified: Secondary | ICD-10-CM

## 2021-04-13 MED ORDER — NITROGLYCERIN 0.4 MG SL SUBL: 0.4000 mg | SUBLINGUAL_TABLET | SUBLINGUAL | 3 refills | Status: AC | PRN

## 2021-04-13 NOTE — Progress Notes (Signed)
Follow-up Outpatient Visit Date: 04/13/2021  Primary Care Provider: Marjie Skiff, NP [address] [place] [address]  Chief Complaint: Follow-up coronary artery disease  HPI:  [name] is a 70 y.o. 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  He was last seen in 08/2020, at which time Brendan [name] noted stable exertional dyspnea and occasional orthostatic lightheadedness.  We agreed to switch his standing furosemide to as needed.  Today, Brendan [name] reports that he has been feeling fairly well.  He has stable orthostatic lightheadedness when he stands up quickly after having been bending over.  This is about the same as at our last visit and did not change much with de-escalation of furosemide.  He has some chronic exertional dyspnea with very strenuous activity, unchanged.  He denies shortness of breath at rest or with usual activities.  He also denies chest pain, palpitations, and edema.  He has noticed some fatigue, often leading to napping in the afternoon.  He has been told in the past that he snores.  He has never been evaluated for sleep apnea and is not interested in undergoing sleep evaluation at this time.  --------------------------------------------------------------------------------------------------  Past Medical History:  Diagnosis Date  . CAD (coronary artery disease)    a. 05/2016 Lexi MV: EF 58%, no ischemia/infarct; b. 05/2017 Cath: LM 20-30, LAD 50p, 62m, D1 50p, D2 80ost, LCX 90/70ost, RCA 80/90p/m, RPL1 50p, EF 50-55%; c. 06/2017 CABG x 4 (LIMA->LAD, VG->D2,VG->OM, VG->PDA).  . Diabetes mellitus without complication (HCC)   . Diastolic dysfunction    a. 05/2016 Echo: EF 55%, Gr1 DD, mild TR/MR.  [name]  Hyperlipidemia   . Hypertension   . Hypokalemia   . Personal history of tobacco use, presenting hazards to health 05/03/2016  . Post-op Afib    a. 06/2017 following CABG->short course of amio.   Past Surgical History:  Procedure Laterality Date  . BACK SURGERY    . CARDIAC CATHETERIZATION    . CORONARY ARTERY BYPASS GRAFT N/A 07/01/2017   Procedure: CORONARY ARTERY BYPASS GRAFTING (CABG) x4, using left mammary artery and right greater saphenous vein harvested endoscopically;  Surgeon: Kerin Perna, MD;  Location: [facility] OR;  Service: Open Heart Surgery;  Laterality: N/A;  . KNEE SURGERY    . LEFT HEART CATH AND CORONARY ANGIOGRAPHY N/A 06/04/2017   Procedure: Left Heart Cath and Coronary Angiography;  Surgeon: Yvonne Kendall, MD;  Location: [facility] INVASIVE CV LAB;  Service: Cardiovascular;  Laterality: N/A;  . TEE WITHOUT CARDIOVERSION N/A 07/01/2017   Procedure: TRANSESOPHAGEAL ECHOCARDIOGRAM (TEE);  Surgeon: Donata Clay, Theron Arista, MD;  Location: [facility] OR;  Service: Open Heart Surgery;  Laterality: N/A;    Current Meds  Medication Sig  . acetaminophen (TYLENOL) 500 MG tablet Take 1 tablet (500 mg total) by mouth every 6 (six) hours as needed for mild pain or headache.  . albuterol (VENTOLIN HFA) 108 (90 Base) MCG/ACT inhaler Inhale 2 puffs into the lungs every 6 (six) hours as needed for wheezing or shortness of breath.  [name] amLODipine (NORVASC) 5 MG tablet Take 1 tablet (5 mg total) by mouth daily.  [name] aspirin EC 81 MG tablet Take 1 tablet (81 mg total) by mouth daily.  [name] atorvastatin (LIPITOR)  20 MG tablet Take 1 tablet (20 mg total) by mouth daily.  [name] azelastine (ASTELIN) 0.1 % nasal spray Place 2 sprays into both nostrils at bedtime.   . chlorthalidone (HYGROTON) 25 MG tablet Take 1 tablet (25 mg total) by mouth daily.  . cyanocobalamin 1000 MCG tablet Take 1,000 mcg by mouth every other day.  . furosemide (LASIX) 20 MG tablet Take 1 tablet (20 mg total) by mouth daily as needed.  . gabapentin  (NEURONTIN) 300 MG capsule Take one capsule in the morning, two capsules at bedtime  . loratadine (CLARITIN) 10 MG tablet Take 1 tablet (10 mg total) by mouth daily.  . metFORMIN (GLUCOPHAGE) 1000 MG tablet Take 1 tablet (1,000 mg total) by mouth 2 (two) times daily with a meal.  . metoprolol tartrate (LOPRESSOR) 25 MG tablet Take 0.5 tablets (12.5 mg total) by mouth 2 (two) times daily.  . montelukast (SINGULAIR) 10 MG tablet Take 1 tablet (10 mg total) by mouth at bedtime.  . potassium chloride SA (KLOR-CON) 20 MEQ tablet Take 2 tablets (40 meq) by mouth once daily  . sildenafil (REVATIO) 20 MG tablet TAKE 1 TO 5 TABLETS AS NEEDED.  [name] tamsulosin (FLOMAX) 0.4 MG CAPS capsule Take 1 capsule (0.4 mg total) by mouth daily.    Allergies: Lisinopril  Social History   Tobacco Use  . Smoking status: Former Smoker    Packs/day: 1.00    Years: 60.00    Pack years: 60.00    Types: Cigarettes    Quit date: 06/29/2017    Years since quitting: 3.7  . Smokeless tobacco: Never Used  Vaping Use  . Vaping Use: Never used  Substance Use Topics  . Alcohol use: No    Comment: previously drank heavily but quit ~ 21 yrs ago.  . Drug use: No    Family History  Problem Relation Age of Onset  . Hypertension Mother   . Diabetes Mother   . Heart disease Father 46       multiple MI's    Review of Systems: Brendan [name] notes ongoing erectile dysfunction with only modest response to sildenafil (up to 100 mg at a time).  Otherwise, a 12-system review of systems was performed and was negative except as noted in the HPI.  --------------------------------------------------------------------------------------------------  Physical Exam: BP 140/80 (BP Location: Left Arm, Patient Position: Sitting, Cuff Size: Large)   Pulse 94   Ht 6\' 3"  (1.905 m)   Wt 205 lb (93 kg)   SpO2 94%   BMI 25.62 kg/m   Repeat BP: 130/76  General:  NAD. Neck: No JVD or HJR. Lungs: Clear to auscultation bilaterally without  wheezes or crackles. Heart: Regular rate and rhythm without murmurs, rubs, or gallops. Abdomen: Soft, nontender, nondistended. Extremities: No lower extremity edema.  EKG: Normal sinus rhythm with PACs.  Otherwise, no significant abnormality or changes from prior tracing on 09/07/2020..  Lab Results  Component Value Date   WBC 7.4 11/24/2018   HGB 16.3 11/24/2018   HCT 47.9 11/24/2018   MCV 85 11/24/2018   PLT 219 11/24/2018    Lab Results  Component Value Date   NA 143 03/17/2021   K 3.9 03/17/2021   CL 100 03/17/2021   CO2 24 03/17/2021   BUN 20 03/17/2021   CREATININE 1.25 03/17/2021   GLUCOSE 109 (H) 03/17/2021   ALT 31 03/17/2021    Lab Results  Component Value Date   CHOL 126 03/17/2021   HDL 33 (L) 03/17/2021  LDLCALC 65 03/17/2021   TRIG 162 (H) 03/17/2021   CHOLHDL 3.5 08/21/2018   --------------------------------------------------------------------------------------------------  ASSESSMENT AND PLAN: Coronary artery disease: No symptoms to suggest worsening coronary insufficiency.  Myocardial perfusion stress test last year (04/2020) was reassuring without ischemia or scar.  Continue current regimen for secondary prevention, including aspirin and atorvastatin.  Prescription for sublingual nitroglycerin provided, with instructions to avoid taking if sildenafil has been used within 24 hours.  Chronic HFpEF: Mr. Bastin reports stable exertional dyspnea with strenuous activity.  He appears euvolemic on examination today.  We will defer medication changes today.  Furosemide can continue to be used on an as-needed basis.  Hypertension: Blood pressure borderline today but typically better (goal less than 130/80).  Importance of sodium restriction was reinforced.  We will defer medication changes today.  Hyperlipidemia associated with type 2 diabetes mellitus: Recent lipid panel showed mildly elevated triglycerides with LDL at goal.  Continue atorvastatin 20 mg daily.   Work on lifestyle modifications to improve HDL and triglycerides.  Ongoing management of DM per PCP.  Erectile dysfunction: Ongoing issue with limited response to PDE 5 inhibitors.  I encouraged Mr. Spieker to discuss this further with his PCP, including potential urology referral.  Sildenafil and nitrates should be avoided.  Follow-up: Return to clinic in 1 year.  Yvonne Kendall, MD 04/13/2021 3:19 PM

## 2021-04-13 NOTE — Patient Instructions (Signed)
Medication Instructions:  Your physician has recommended you make the following change in your medication:   An Rx for sublingual Nitro-glycerin has been sent to your pharmacy. Use as directed.  DO NOT use Nitro-glycerin with in 24 hors of taking Sildenafil or Viagra.  *If you need a refill on your cardiac medications before your next appointment, please call your pharmacy*   Lab Work: None ordered If you have labs (blood work) drawn today and your tests are completely normal, you will receive your results only by: [name] MyChart Message (if you have MyChart) OR . A paper copy in the mail If you have any lab test that is abnormal or we need to change your treatment, we will call you to review the results.   Testing/Procedures: None ordered   Follow-Up: At [facility], you and your health needs are our priority.  As part of our continuing mission to provide you with exceptional heart care, we have created designated Provider Care Teams.  These Care Teams include your primary Cardiologist (physician) and Advanced Practice Providers (APPs -  Physician Assistants and Nurse Practitioners) who all work together to provide you with the care you need, when you need it.  We recommend signing up for the patient portal called "MyChart".  Sign up information is provided on this After Visit Summary.  MyChart is used to connect with patients for Virtual Visits (Telemedicine).  Patients are able to view lab/test results, encounter notes, upcoming appointments, etc.  Non-urgent messages can be sent to your provider as well.   To learn more about what you can do with MyChart, go to [email or web address].    Your next appointment:   Your physician wants you to follow-up in: 1 year You will receive a reminder letter in the mail two months in advance. If you don't receive a letter, please call our office to schedule the follow-up appointment.   The format for your next appointment:   In  Person  Provider:   You may see Yvonne Kendall, MD or one of the following Advanced Practice Providers on your designated Care Team:    Nicolasa Ducking, NP  Eula Listen, PA-C  Marisue Ivan, PA-C  Cadence Fransico Michael, [place]  Gillian Shields, NP    Other Instructions  Nitroglycerin sublingual tablets What is this medicine? NITROGLYCERIN (nye troe GLI ser in) is a type of vasodilator. It relaxes blood vessels, increasing the blood and oxygen supply to your heart. This medicine is used to relieve chest pain caused by angina. It is also used to prevent chest pain before activities like climbing stairs, going outdoors in cold weather, or sexual activity. This medicine may be used for other purposes; ask your health care provider or pharmacist if you have questions. COMMON BRAND NAME(S): Nitroquick, Nitrostat, Nitrotab What should I tell my health care provider before I take this medicine? They need to know if you have any of these conditions:  anemia  head injury, recent stroke, or bleeding in the brain  liver disease  previous heart attack  an unusual or allergic reaction to nitroglycerin, other medicines, foods, dyes, or preservatives  pregnant or trying to get pregnant  breast-feeding How should I use this medicine? Take this medicine by mouth as needed. Use at the first sign of an angina attack (chest pain or tightness). You can also take this medicine 5 to 10 minutes before an event likely to produce chest pain. Follow the directions exactly as written on the prescription label. Place one tablet under  your tongue and let it dissolve. Do not swallow whole. Replace the dose if you accidentally swallow it. It will help if your mouth is not dry. Saliva around the tablet will help it to dissolve more quickly. Do not eat or drink, smoke or chew tobacco while a tablet is dissolving. Sit down when taking this medicine. In an angina attack, you should feel better within 5 minutes after  your first dose. You can take a dose every 5 minutes up to a total of 3 doses. If you do not feel better or feel worse after 1 dose, call 9-1-1 at once. Do not take more than 3 doses in 15 minutes. Your health care provider might give you other directions. Follow those directions if he or she does. Do not take your medicine more often than directed. Talk to your health care provider about the use of this medicine in children. Special care may be needed. Overdosage: If you think you have taken too much of this medicine contact a poison control center or emergency room at once. NOTE: This medicine is only for you. Do not share this medicine with others. What if I miss a dose? This does not apply. This medicine is only used as needed. What may interact with this medicine? Do not take this medicine with any of the following medications:  certain migraine medicines like ergotamine and dihydroergotamine (DHE)  medicines used to treat erectile dysfunction like sildenafil, tadalafil, and vardenafil  riociguat This medicine may also interact with the following medications:  alteplase  aspirin  heparin  medicines for high blood pressure  medicines for mental depression  other medicines used to treat angina  phenothiazines like chlorpromazine, mesoridazine, prochlorperazine, thioridazine This list may not describe all possible interactions. Give your health care provider a list of all the medicines, herbs, non-prescription drugs, or dietary supplements you use. Also tell them if you smoke, drink alcohol, or use illegal drugs. Some items may interact with your medicine. What should I watch for while using this medicine? Tell your doctor or health care professional if you feel your medicine is no longer working. Keep this medicine with you at all times. Sit or lie down when you take your medicine to prevent falling if you feel dizzy or faint after using it. Try to remain calm. This will help you  to feel better faster. If you feel dizzy, take several deep breaths and lie down with your feet propped up, or bend forward with your head resting between your knees. You may get drowsy or dizzy. Do not drive, use machinery, or do anything that needs mental alertness until you know how this drug affects you. Do not stand or sit up quickly, especially if you are an older patient. This reduces the risk of dizzy or fainting spells. Alcohol can make you more drowsy and dizzy. Avoid alcoholic drinks. Do not treat yourself for coughs, colds, or pain while you are taking this medicine without asking your doctor or health care professional for advice. Some ingredients may increase your blood pressure. What side effects may I notice from receiving this medicine? Side effects that you should report to your doctor or health care professional as soon as possible:  allergic reactions (skin rash, itching or hives; swelling of the face, lips, or tongue)  low blood pressure (dizziness; feeling faint or lightheaded, falls; unusually weak or tired)  low red blood cell counts (trouble breathing; feeling faint; lightheaded, falls; unusually weak or tired) Side effects that usually  do not require medical attention (report to your doctor or health care professional if they continue or are bothersome):  facial flushing (redness)  headache  nausea, vomiting This list may not describe all possible side effects. Call your doctor for medical advice about side effects. You may report side effects to FDA at 1-800-FDA-1088. Where should I keep my medicine? Keep out of the reach of children. Store at room temperature between 20 and 25 degrees C (68 and 77 degrees F). Store in Retail buyer. Protect from light and moisture. Keep tightly closed. Throw away any unused medicine after the expiration date. NOTE: This sheet is a summary. It may not cover all possible information. If you have questions about this medicine, talk  to your doctor, pharmacist, or health care provider.  2021 Elsevier/Gold Standard (2018-09-17 16:46:32)

## 2021-04-14 ENCOUNTER — Encounter: Payer: Self-pay | Admitting: Internal Medicine

## 2021-04-17 ENCOUNTER — Ambulatory Visit (INDEPENDENT_AMBULATORY_CARE_PROVIDER_SITE_OTHER): Payer: Medicare HMO

## 2021-04-17 VITALS — Ht 75.0 in | Wt 205.0 lb

## 2021-04-17 DIAGNOSIS — Z0000 Encounter for general adult medical examination without abnormal findings: Secondary | ICD-10-CM | POA: Diagnosis not present

## 2021-04-17 NOTE — Patient Instructions (Signed)
Brendan [name] , Thank you for taking time to come for your Medicare Wellness Visit. I appreciate your ongoing commitment to your health goals. Please review the following plan we discussed and let me know if I can assist you in the future.   Screening recommendations/referrals: Colonoscopy: completed 05/04/2014 Recommended yearly ophthalmology/optometry visit for glaucoma screening and checkup Recommended yearly dental visit for hygiene and checkup  Vaccinations: Influenza vaccine: completed 12/06/2020, due 07/31/2021 Pneumococcal vaccine: completed 03/19/2017 Tdap vaccine: due Shingles vaccine: discussed   Covid-19:  11/10/2020, 03/26/2020,02/27/2020  Advanced directives: Advance directive discussed with you today.  Conditions/risks identified: noone  Next appointment: Follow up in one year for your annual wellness visit.   Preventive Care 35 Years and Older, Male Preventive care refers to lifestyle choices and visits with your health care provider that can promote health and wellness. What does preventive care include?  A yearly physical exam. This is also called an annual well check.  Dental exams once or twice a year.  Routine eye exams. Ask your health care provider how often you should have your eyes checked.  Personal lifestyle choices, including:  Daily care of your teeth and gums.  Regular physical activity.  Eating a healthy diet.  Avoiding tobacco and drug use.  Limiting alcohol use.  Practicing safe sex.  Taking low doses of aspirin every day.  Taking vitamin and mineral supplements as recommended by your health care provider. What happens during an annual well check? The services and screenings done by your health care provider during your annual well check will depend on your age, overall health, lifestyle risk factors, and family history of disease. Counseling  Your health care provider may ask you questions about your:  Alcohol use.  Tobacco use.  Drug  use.  Emotional well-being.  Home and relationship well-being.  Sexual activity.  Eating habits.  History of falls.  Memory and ability to understand (cognition).  Work and work Astronomer. Screening  You may have the following tests or measurements:  Height, weight, and BMI.  Blood pressure.  Lipid and cholesterol levels. These may be checked every 5 years, or more frequently if you are over 45 years old.  Skin check.  Lung cancer screening. You may have this screening every year starting at age 47 if you have a 30-pack-year history of smoking and currently smoke or have quit within the past 15 years.  Fecal occult blood test (FOBT) of the stool. You may have this test every year starting at age 28.  Flexible sigmoidoscopy or colonoscopy. You may have a sigmoidoscopy every 5 years or a colonoscopy every 10 years starting at age 24.  Prostate cancer screening. Recommendations will vary depending on your family history and other risks.  Hepatitis C blood test.  Hepatitis B blood test.  Sexually transmitted disease (STD) testing.  Diabetes screening. This is done by checking your blood sugar (glucose) after you have not eaten for a while (fasting). You may have this done every 1-3 years.  Abdominal aortic aneurysm (AAA) screening. You may need this if you are a current or former smoker.  Osteoporosis. You may be screened starting at age 25 if you are at high risk. Talk with your health care provider about your test results, treatment options, and if necessary, the need for more tests. Vaccines  Your health care provider may recommend certain vaccines, such as:  Influenza vaccine. This is recommended every year.  Tetanus, diphtheria, and acellular pertussis (Tdap, Td) vaccine. You may need  a Td booster every 10 years.  Zoster vaccine. You may need this after age 79.  Pneumococcal 13-valent conjugate (PCV13) vaccine. One dose is recommended after age  37.  Pneumococcal polysaccharide (PPSV23) vaccine. One dose is recommended after age 77. Talk to your health care provider about which screenings and vaccines you need and how often you need them. This information is not intended to replace advice given to you by your health care provider. Make sure you discuss any questions you have with your health care provider. Document Released: 01/13/2016 Document Revised: 09/05/2016 Document Reviewed: 10/18/2015 Elsevier Interactive Patient Education  2017 [place] Prevention in the Home Falls can cause injuries. They can happen to people of all ages. There are many things you can do to make your home safe and to help prevent falls. What can I do on the outside of my home?  Regularly fix the edges of walkways and driveways and fix any cracks.  Remove anything that might make you trip as you walk through a door, such as a raised step or threshold.  Trim any bushes or trees on the path to your home.  Use bright outdoor lighting.  Clear any walking paths of anything that might make someone trip, such as rocks or tools.  Regularly check to see if handrails are loose or broken. Make sure that both sides of any steps have handrails.  Any raised decks and porches should have guardrails on the edges.  Have any leaves, snow, or ice cleared regularly.  Use sand or salt on walking paths during winter.  Clean up any spills in your garage right away. This includes oil or grease spills. What can I do in the bathroom?  Use night lights.  Install grab bars by the toilet and in the tub and shower. Do not use towel bars as grab bars.  Use non-skid mats or decals in the tub or shower.  If you need to sit down in the shower, use a plastic, non-slip stool.  Keep the floor dry. Clean up any water that spills on the floor as soon as it happens.  Remove soap buildup in the tub or shower regularly.  Attach bath mats securely with double-sided  non-slip rug tape.  Do not have throw rugs and other things on the floor that can make you trip. What can I do in the bedroom?  Use night lights.  Make sure that you have a light by your bed that is easy to reach.  Do not use any sheets or blankets that are too big for your bed. They should not hang down onto the floor.  Have a firm chair that has side arms. You can use this for support while you get dressed.  Do not have throw rugs and other things on the floor that can make you trip. What can I do in the kitchen?  Clean up any spills right away.  Avoid walking on wet floors.  Keep items that you use a lot in easy-to-reach places.  If you need to reach something above you, use a strong step stool that has a grab bar.  Keep electrical cords out of the way.  Do not use floor polish or wax that makes floors slippery. If you must use wax, use non-skid floor wax.  Do not have throw rugs and other things on the floor that can make you trip. What can I do with my stairs?  Do not leave any items on the  stairs.  Make sure that there are handrails on both sides of the stairs and use them. Fix handrails that are broken or loose. Make sure that handrails are as long as the stairways.  Check any carpeting to make sure that it is firmly attached to the stairs. Fix any carpet that is loose or worn.  Avoid having throw rugs at the top or bottom of the stairs. If you do have throw rugs, attach them to the floor with carpet tape.  Make sure that you have a light switch at the top of the stairs and the bottom of the stairs. If you do not have them, ask someone to add them for you. What else can I do to help prevent falls?  Wear shoes that:  Do not have high heels.  Have rubber bottoms.  Are comfortable and fit you well.  Are closed at the toe. Do not wear sandals.  If you use a stepladder:  Make sure that it is fully opened. Do not climb a closed stepladder.  Make sure that both  sides of the stepladder are locked into place.  Ask someone to hold it for you, if possible.  Clearly mark and make sure that you can see:  Any grab bars or handrails.  First and last steps.  Where the edge of each step is.  Use tools that help you move around (mobility aids) if they are needed. These include:  Canes.  Walkers.  Scooters.  Crutches.  Turn on the lights when you go into a dark area. Replace any light bulbs as soon as they burn out.  Set up your furniture so you have a clear path. Avoid moving your furniture around.  If any of your floors are uneven, fix them.  If there are any pets around you, be aware of where they are.  Review your medicines with your doctor. Some medicines can make you feel dizzy. This can increase your chance of falling. Ask your doctor what other things that you can do to help prevent falls. This information is not intended to replace advice given to you by your health care provider. Make sure you discuss any questions you have with your health care provider. Document Released: 10/13/2009 Document Revised: 05/24/2016 Document Reviewed: 01/21/2015 Elsevier Interactive Patient Education  2017 [employer].

## 2021-04-17 NOTE — Progress Notes (Signed)
I connected with Brendan [name] today by telephone and verified that I am speaking with the correct person using two identifiers. Location patient: home Location provider: work Persons participating in the virtual visit: Brendan [name], Elisha Ponder LPN.   I discussed the limitations, risks, security and privacy concerns of performing an evaluation and management service by telephone and the availability of in person appointments. I also discussed with the patient that there may be a patient responsible charge related to this service. The patient expressed understanding and verbally consented to this telephonic visit.    Interactive audio and video telecommunications were attempted between this provider and patient, however failed, due to patient having technical difficulties OR patient did not have access to video capability.  We continued and completed visit with audio only.     Vital signs may be patient reported or missing.  Subjective:   Brendan [name] is a 70 y.o. male who presents for Medicare Annual/Subsequent preventive examination.  Review of Systems     Cardiac Risk Factors include: advanced age (>41men, >45 women);diabetes mellitus;hypertension;male gender     Objective:    Today's Vitals   04/17/21 1341  Weight: 205 lb (93 kg)  Height: 6\' 3"  (1.905 m)   Body mass index is 25.62 kg/m.  Advanced Directives 04/17/2021 03/28/2020 03/26/2019 03/20/2018 07/02/2017 06/18/2017 06/04/2017  Does Patient Have a Medical Advance Directive? No No No No No No No  Would patient like information on creating a medical advance directive? - Yes (MAU/Ambulatory/Procedural Areas - Information given) Yes (MAU/Ambulatory/Procedural Areas - Information given) Yes (MAU/Ambulatory/Procedural Areas - Information given) No - Patient declined - No - Patient declined    Current Medications (verified) Outpatient Encounter Medications as of 04/17/2021  Medication Sig  . acetaminophen (TYLENOL) 500 MG tablet Take  1 tablet (500 mg total) by mouth every 6 (six) hours as needed for mild pain or headache.  . albuterol (VENTOLIN HFA) 108 (90 Base) MCG/ACT inhaler Inhale 2 puffs into the lungs every 6 (six) hours as needed for wheezing or shortness of breath.  04/19/2021 amLODipine (NORVASC) 5 MG tablet Take 1 tablet (5 mg total) by mouth daily.  [name] aspirin EC 81 MG tablet Take 1 tablet (81 mg total) by mouth daily.  [name] atorvastatin (LIPITOR) 20 MG tablet Take 1 tablet (20 mg total) by mouth daily.  [name] azelastine (ASTELIN) 0.1 % nasal spray Place 2 sprays into both nostrils at bedtime.   . chlorthalidone (HYGROTON) 25 MG tablet Take 1 tablet (25 mg total) by mouth daily.  . cyanocobalamin 1000 MCG tablet Take 1,000 mcg by mouth every other day.  . furosemide (LASIX) 20 MG tablet Take 1 tablet (20 mg total) by mouth daily as needed.  . gabapentin (NEURONTIN) 300 MG capsule Take one capsule in the morning, two capsules at bedtime  . loratadine (CLARITIN) 10 MG tablet Take 1 tablet (10 mg total) by mouth daily.  . metFORMIN (GLUCOPHAGE) 1000 MG tablet Take 1 tablet (1,000 mg total) by mouth 2 (two) times daily with a meal.  . metoprolol tartrate (LOPRESSOR) 25 MG tablet Take 0.5 tablets (12.5 mg total) by mouth 2 (two) times daily.  . montelukast (SINGULAIR) 10 MG tablet Take 1 tablet (10 mg total) by mouth at bedtime.  . nitroGLYCERIN (NITROSTAT) 0.4 MG SL tablet Place 1 tablet (0.4 mg total) under the tongue every 5 (five) minutes as needed for chest pain.  . potassium chloride SA (KLOR-CON) 20 MEQ tablet Take 2 tablets (40 meq) by mouth once  daily  . sildenafil (REVATIO) 20 MG tablet TAKE 1 TO 5 TABLETS AS NEEDED.  [name] tamsulosin (FLOMAX) 0.4 MG CAPS capsule Take 1 capsule (0.4 mg total) by mouth daily.   No facility-administered encounter medications on file as of 04/17/2021.    Allergies (verified) Lisinopril   History: Past Medical History:  Diagnosis Date  . CAD (coronary artery disease)    a. 05/2016 Lexi MV: EF  58%, no ischemia/infarct; b. 05/2017 Cath: LM 20-30, LAD 50p, 60m, D1 50p, D2 80ost, LCX 90/70ost, RCA 80/90p/m, RPL1 50p, EF 50-55%; c. 06/2017 CABG x 4 (LIMA->LAD, VG->D2,VG->OM, VG->PDA).  . Diabetes mellitus without complication (HCC)   . Diastolic dysfunction    a. 05/2016 Echo: EF 55%, Gr1 DD, mild TR/MR.  [name] Hyperlipidemia   . Hypertension   . Hypokalemia   . Personal history of tobacco use, presenting hazards to health 05/03/2016  . Post-op Afib    a. 06/2017 following CABG->short course of amio.   Past Surgical History:  Procedure Laterality Date  . BACK SURGERY    . CARDIAC CATHETERIZATION    . CORONARY ARTERY BYPASS GRAFT N/A 07/01/2017   Procedure: CORONARY ARTERY BYPASS GRAFTING (CABG) x4, using left mammary artery and right greater saphenous vein harvested endoscopically;  Surgeon: Kerin Perna, MD;  Location: [facility] OR;  Service: Open Heart Surgery;  Laterality: N/A;  . KNEE SURGERY    . LEFT HEART CATH AND CORONARY ANGIOGRAPHY N/A 06/04/2017   Procedure: Left Heart Cath and Coronary Angiography;  Surgeon: Yvonne Kendall, MD;  Location: [facility] INVASIVE CV LAB;  Service: Cardiovascular;  Laterality: N/A;  . TEE WITHOUT CARDIOVERSION N/A 07/01/2017   Procedure: TRANSESOPHAGEAL ECHOCARDIOGRAM (TEE);  Surgeon: Donata Clay, Theron Arista, MD;  Location[facility] OR;  Service: Open Heart Surgery;  Laterality: N/A;   Family History  Problem Relation Age of Onset  . Hypertension Mother   . Diabetes Mother   . Heart disease Father 74       multiple MI's   Social History   Socioeconomic History  . Marital status: Widowed    Spouse name: Not on file  . Number of children: Not on file  . Years of education: Not on file  . Highest education level: 10th grade  Occupational History  . Occupation: works part time as Engineer, maintenance  . Smoking status: Former Smoker    Packs/day: 1.00    Years: 60.00    Pack years: 60.00    Types: Cigarettes    Quit date: 06/29/2017    Years since quitting: 3.8   . Smokeless tobacco: Never Used  Vaping Use  . Vaping Use: Never used  Substance and Sexual Activity  . Alcohol use: No    Comment: previously drank heavily but quit ~ 21 yrs ago.  . Drug use: No  . Sexual activity: Yes  Other Topics Concern  . Not on file  Social History Narrative   Lives in [place] by himself.  Works as Copy.  Does not routinely exercise.   Social Determinants of Health   Financial Resource Strain: Low Risk   . Difficulty of Paying Living Expenses: Not hard at all  Food Insecurity: No Food Insecurity  . Worried About Programme researcher, broadcasting/film/video in the Last Year: Never true  . Ran Out of Food in the Last Year: Never true  Transportation Needs: No Transportation Needs  . Lack of Transportation (Medical): No  . Lack of Transportation (Non-Medical): No  Physical Activity: Inactive  .  Days of Exercise per Week: 0 days  . Minutes of Exercise per Session: 0 min  Stress: No Stress Concern Present  . Feeling of Stress : Not at all  Social Connections: Not on file    Tobacco Counseling Counseling given: Not Answered   Clinical Intake:  Pre-visit preparation completed: Yes  Pain : No/denies pain     Nutritional Status: BMI 25 -29 Overweight Nutritional Risks: None Diabetes: Yes  How often do you need to have someone help you when you read instructions, pamphlets, or other written materials from your doctor or pharmacy?: 1 - Never What is the last grade level you completed in school?: 10th grade  Diabetic? Yes Nutrition Risk Assessment:  Has the patient had any N/V/D within the last 2 months?  No  Does the patient have any non-healing wounds?  No  Has the patient had any unintentional weight loss or weight gain?  No   Diabetes:  Is the patient diabetic?  Yes  If diabetic, was a CBG obtained today?  No  Did the patient bring in their glucometer from home?  No  How often do you monitor your CBG's? Every other day.   Financial Strains and Diabetes  Management:  Are you having any financial strains with the device, your supplies or your medication? No .  Does the patient want to be seen by Chronic Care Management for management of their diabetes?  No  Would the patient like to be referred to a Nutritionist or for Diabetic Management?  No   Diabetic Exams:  Diabetic Eye Exam: Completed 08/25/2020 Diabetic Foot Exam: Completed 03/17/2021   Interpreter Needed?: No  Information entered by :: NAllen LPN   Activities of Daily Living In your present state of health, do you have any difficulty performing the following activities: 04/17/2021  Hearing? Y  Vision? N  Difficulty concentrating or making decisions? N  Walking or climbing stairs? N  Dressing or bathing? N  Doing errands, shopping? N  Preparing Food and eating ? N  Using the Toilet? N  In the past six months, have you accidently leaked urine? N  Do you have problems with loss of bowel control? N  Managing your Medications? N  Managing your Finances? N  Housekeeping or managing your Housekeeping? N  Some recent data might be hidden    Patient Care Team: Marjie Skiffannady, [name], NP as PCP - General (Nurse Practitioner) End, Cristal Deerhristopher, MD as PCP - Cardiology (Cardiology) End, Cristal Deerhristopher, MD as Consulting Physician (Cardiology) Donata ClayVan Trigt, Theron AristaPeter, MD (Inactive) as Consulting Physician (Cardiothoracic Surgery) Marlowe Saxate, Pamela J, RN as Case Manager (General Practice)  Indicate any recent Medical Services you may have received from other than [facility] providers in the past year (date may be approximate).     Assessment:   This is a routine wellness examination for Peyton NajjarLarry.  Hearing/Vision screen  Hearing Screening   125Hz  250Hz  500Hz  1000Hz  2000Hz  3000Hz  4000Hz  6000Hz  8000Hz   Right ear:           Left ear:           Vision Screening Comments: Regular eye exams, Dr. Elmer PickerHecker  Dietary issues and exercise activities discussed: Current Exercise Habits: The patient has a physically  strenuous job, but has no regular exercise apart from work.  Goals    .  DIET - INCREASE WATER INTAKE      Recommend drinking at least 6-8 glasses of water a day     .  Patient Stated  04/17/2021, no goals    .  Quit smoking / using tobacco    .  RN-I would like to get Diabetic shoes (pt-stated)      Current Barriers:  [name] Knowledge Deficits related to obtaining diabetic shoes  . Corporate treasurer.  . Chronic Disease Management support and education needs related to Diabetes   Nurse Case Manager Clinical Goal(s):  [name] Over the next 120 days, patient will work with [facility] and PCP to address needs related to obtaining Diabetic Shoes.  Interventions:  . Discussed plans with patient for ongoing care management follow up and provided patient with direct contact information for care management team . Reviewed scheduled/upcoming provider appointments including: 8/24 PCP visit . Discussed with patient he would still like to  have diabetic shoes related to patient does a lot of walking with his job. He states he has a hard time getting shoes to fit and worries about getting blisters or calluses. 07-13-2020: the patient picked up his diabetic shoes on 7/06-2020.  The patient likes his shoes. States they feel good when he is wearing them but his feet are a little sore when he takes them off. The patient has a follow up with the foot doctor on the 28th of July and will let the doctor know and see if there are any recommendations. . Evaluation of the patients current work status. 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 He wants to protect his feet. He says he does not do structured exercise but he walks a lot.    Patient Self Care Activities:  . Currently UNABLE TO independently obtain diabetic shoes  Please see past updates related to this goal by clicking on the "Past Updates" button in the selected goal       .  RNCM: Pt-"I check my blood pressure at home too" (pt-stated)      CARE PLAN ENTRY (see longtitudinal plan of care for additional care plan information)  Current Barriers:  . Chronic Disease Management support, education, and care coordination needs related to CAD, HTN, HLD, DMII, and Pulmonary Disease  Clinical Goal(s) related to CAD, HTN, HLD, DMII, and Pulmonary Disease:  Over the next 120 days, patient will:  . Work with the care management team to address educational, disease management, and care coordination needs  . Begin or continue self health monitoring activities as directed today Measure and record cbg (blood glucose) 1 times daily, Measure and record blood pressure 2/3 times per week, and adhere to a heart healthy/ADA diet . Call provider office for new or worsened signs and symptoms Blood glucose findings outside established parameters, Blood pressure findings outside established parameters, Oxygen saturation lower than established parameter, Chest pain, Shortness of breath, and New or worsened symptom related to emphysema, HLD, and other chronic conditions . Call care management team with questions or concerns . Verbalize basic understanding of patient centered plan of care established today  Interventions related to CAD, HTN, HLD, DMII, and Pulmonary Disease:  . Evaluation of current treatment plans and patient's adherence to plan as established by provider.  Has a good understanding of his chronic conditions. Had CABG 3 years ago on 7-2.  . Assessed patient understanding of disease states.  Is compliant with the treatment regimen, taking medications, and monitoring for changes in his health.  . Assessed patient's education and care coordination needs. Care guide referral for resources in  the community to help with installation of grab bars/rails in his bathroom. The patient wants these to prevent falls or accidents.  . Provided disease specific education to patient. Review  and education on heart healthy/ADA diet. The patient states he tries to watch what he eats. . Evaluation of blood sugar readings. The patient states sometimes they are in the 90's but mostly in 110's to 120's.  Denies any hypoglycemic episodes. Discussed s/s of hyperglycemia and hypoglycemia.  . Evaluation of blood pressure checks. The patient verbalized that he does check his blood pressures and records. The patient denies any concerns with his blood pressure at this time.  Steele Sizer with appropriate clinical care team members regarding patient needs.  The patient has worked with the CCM pharmacist in the past. Has no needs at this time. The patient denies needs from the LCSW. Knows team available for assistance.   Patient Self Care Activities related to CAD, HTN, HLD, DMII, and Pulmonary Disease:  . Patient is unable to independently self-manage chronic health conditions  Initial goal documentation       Depression Screen PHQ 2/9 Scores 04/17/2021 01/17/2021 07/13/2020 03/28/2020 03/26/2019 05/13/2018 03/20/2018  PHQ - 2 Score 0 0 0 0 0 0 0  PHQ- 9 Score - - - - - - -    Fall Risk Fall Risk  04/17/2021 01/17/2021 12/06/2020 03/28/2020 05/12/2019  Falls in the past year? 0 0 0 0 0  Number falls in past yr: - - 0 0 -  Injury with Fall? - - 0 0 -  Risk for fall due to : Medication side effect - - - -  Follow up Falls evaluation completed;Education provided;Falls prevention discussed - Falls evaluation completed - Falls evaluation completed    FALL RISK PREVENTION PERTAINING TO THE HOME:  Any stairs in or around the home? Yes  If so, are there any without handrails? Yes  Home free of loose throw rugs in walkways, pet beds, electrical cords, etc? Yes  Adequate lighting in your home to reduce risk of falls? Yes   ASSISTIVE DEVICES UTILIZED TO PREVENT FALLS:  Life alert? No  Use of a cane, walker or w/c? No  Grab bars in the bathroom? No  Shower chair or bench in shower? No  Elevated  toilet seat or a handicapped toilet? Yes   TIMED UP AND GO:  Was the test performed? No .     Cognitive Function:     6CIT Screen 04/17/2021 03/26/2019 03/20/2018  What Year? 0 points 0 points 0 points  What month? 0 points 0 points 0 points  What time? 0 points 0 points 0 points  Count back from 20 0 points 0 points 0 points  Months in reverse 0 points 0 points 0 points  Repeat phrase 6 points 2 points 2 points  Total Score 6 2 2     Immunizations Immunization History  Administered Date(s) Administered  . Fluad Quad(high Dose 65+) 11/23/2019, 12/06/2020  . Influenza, High Dose Seasonal PF 09/17/2016, 11/20/2017, 11/24/2018  . Influenza,inj,Quad PF,6+ Mos 11/30/2015  . Influenza-Unspecified 11/11/2013, 11/30/2015  . Moderna Sars-Covid-2 Vaccination 02/27/2020, 03/26/2020, 11/10/2020  . Pneumococcal Conjugate-13 05/10/2014  . Pneumococcal Polysaccharide-23 09/15/2007, 03/19/2017  . Td 08/16/2005  . Zoster 07/31/2011    TDAP status: Due, Education has been provided regarding the importance of this vaccine. Advised may receive this vaccine at local pharmacy or Health Dept. Aware to provide a copy of the vaccination record if obtained from local pharmacy or Health Dept.  Verbalized acceptance and understanding.  Flu Vaccine status: Up to date  Pneumococcal vaccine status: Up to date  Covid-19 vaccine status: Completed vaccines  Qualifies for Shingles Vaccine? Yes   Zostavax completed Yes   Shingrix Completed?: No.    Education has been provided regarding the importance of this vaccine. Patient has been advised to call insurance company to determine out of pocket expense if they have not yet received this vaccine. Advised may also receive vaccine at local pharmacy or Health Dept. Verbalized acceptance and understanding.  Screening Tests Health Maintenance  Topic Date Due  . TETANUS/TDAP  08/17/2015  . INFLUENZA VACCINE  07/31/2021  . OPHTHALMOLOGY EXAM  08/25/2021  .  HEMOGLOBIN A1C  09/17/2021  . FOOT EXAM  03/17/2022  . URINE MICROALBUMIN  03/17/2022  . COLONOSCOPY (Pts 45-1yrs Insurance coverage will need to be confirmed)  04/15/2024  . COVID-19 Vaccine  Completed  . Hepatitis C Screening  Completed  . PNA vac Low Risk Adult  Completed  . HPV VACCINES  Aged Out    Health Maintenance  Health Maintenance Due  Topic Date Due  . TETANUS/TDAP  08/17/2015    Colorectal cancer screening: Type of screening: Colonoscopy. Completed 05/04/2014. Repeat every 10 years  Lung Cancer Screening: (Low Dose CT Chest recommended if Age 14-80 years, 30 pack-year currently smoking OR have quit w/in 15years.) does qualify.   Lung Cancer Screening Referral: CT scan 06/30/2020  Additional Screening:  Hepatitis C Screening: does qualify; Completed 11/30/2015   Vision Screening: Recommended annual ophthalmology exams for early detection of glaucoma and other disorders of the eye. Is the patient up to date with their annual eye exam?  Yes  Who is the provider or what is the name of the office in which the patient attends annual eye exams? Dr. Elmer Picker If pt is not established with a provider, would they like to be referred to a provider to establish care? No .   Dental Screening: Recommended annual dental exams for proper oral hygiene  Community Resource Referral / Chronic Care Management: CRR required this visit?  Yes   CCM required this visit?  No      Plan:     I have personally reviewed and noted the following in the patient's chart:   . Medical and social history . Use of alcohol, tobacco or illicit drugs  . Current medications and supplements . Functional ability and status . Nutritional status . Physical activity . Advanced directives . List of other physicians . Hospitalizations, surgeries, and ER visits in previous 12 months . Vitals . Screenings to include cognitive, depression, and falls . Referrals and appointments  In addition, I have  reviewed and discussed with patient certain preventive protocols, quality metrics, and best practice recommendations. A written personalized care plan for preventive services as well as general preventive health recommendations were provided to patient.     Barb Merino, LPN   1/61/0960   Nurse Notes:

## 2021-04-19 ENCOUNTER — Telehealth: Payer: Self-pay

## 2021-04-19 NOTE — Telephone Encounter (Signed)
   Telephone encounter was:  Unsuccessful.  04/19/2021 Name: Brendan [name] MRN: [id number] DOB: [birth year]  Unsuccessful outbound call made today to assist with:  Resources for grab bar installation.  Outreach Attempt:  1st Attempt  Unable to leave message voicemail not set-up  Cyle Kenyon, AAS Paralegal, [facility] Care Guide . Embedded Care Coordination [facility]  Care Management  [address] [place], [place] [address] ??[email or web address]  ?? [phone]   [email or web address]

## 2021-04-21 ENCOUNTER — Telehealth: Payer: Self-pay

## 2021-04-21 NOTE — Telephone Encounter (Signed)
   Telephone encounter was:  Successful.  04/21/2021 Name: Kerrigan Glendening MRN: [id number] DOB: 1951-02-07  Clever Geraldo is a 70 y.o. year old male who is a primary care patient of [name], Dorie Rank, NP . The community resource team was consulted for assistance with Home Modifications and Spoke with patient about resources for grab bar installation. [place] [facility].   Care guide performed the following interventions: Patient provided with information about care guide support team and interviewed to confirm resource needs.  Follow Up Plan:  Care guide will follow up with patient by phone over the next 7 days  Darlynn Ricco, AAS Paralegal, [facility] Care Guide . Embedded Care Coordination [facility]  Care Management  [address] [place], [place] [address] ??[email or web address]  ?? [phone]   [email or web address]

## 2021-04-25 ENCOUNTER — Telehealth: Payer: Self-pay

## 2021-04-25 NOTE — Telephone Encounter (Signed)
   Telephone encounter was:  Unsuccessful.  04/25/2021 Name: Cederic Mozley MRN: [id number] DOB: 12/29/1951  Unsuccessful outbound call made today to assist with:  grab bar installation  Outreach Attempt:  2nd Attempt  Unable to leave message voicemail not set-up.  Kevyn Wengert, AAS Paralegal, [facility] Care Guide . Embedded Care Coordination [facility]  Care Management  [address] [place], [place] [address] ??[email or web address]  ?? [phone]   [email or web address]

## 2021-04-27 ENCOUNTER — Telehealth: Payer: Self-pay

## 2021-04-27 NOTE — Telephone Encounter (Signed)
   Telephone encounter was:  Unsuccessful.  04/27/2021 Name: Brendan [name] MRN: [id number] DOB: 1951-12-16  Unsuccessful outbound call made today to assist with:  resource for grab bar installation  Outreach Attempt:  3rd Attempt.  Referral closed unable to contact patient.  Unable to leave message voicemail not set-up.  Evalynne Locurto, AAS Paralegal, [facility] Care Guide . Embedded Care Coordination [facility] Health  Care Management  [address] [place], [place] [address] ??[email or web address]  ?? [phone]   [email or web address]

## 2021-05-08 ENCOUNTER — Other Ambulatory Visit: Payer: Self-pay | Admitting: Nurse Practitioner

## 2021-05-08 NOTE — Telephone Encounter (Signed)
Requested medications are due for refill today.  unknown  Requested medications are on the active medications list.  yes  Last refill. 11/23/2019  Future visit scheduled.   yes  Notes to clinic.  Prescription is expired.

## 2021-05-09 NOTE — Telephone Encounter (Signed)
Scheduled 6/17 

## 2021-05-17 NOTE — Progress Notes (Signed)
BP 122/79   Pulse 72   Temp 98 F (36.7 C)   SpO2 93%    Subjective:    Patient ID: Brendan [name], male    DOB: [birth year], 70 y.o.   MRN: [id number]  HPI: Daejon Lich is a 70 y.o. male  Chief Complaint  Patient presents with  . Shoulder Pain    Right x 2 month, no known injury   SHOULDER PAIN Duration: months Involved shoulder: right Mechanism of injury: unknown Location: lateral Onset:gradual Severity: 10/10  Quality:  sharp Frequency: intermittent with movement or sitting a certain way Radiation: yes to his neck Aggravating factors: movement  Alleviating factors: cream, patches  Status: stable Treatments attempted: none  Relief with NSAIDs?:  mild Weakness: yes Numbness: no Decreased grip strength: yes Redness: no Swelling: no Bruising: no Fevers: no   Relevant past medical, surgical, family and social history reviewed and updated as indicated. Interim medical history since our last visit reviewed. Allergies and medications reviewed and updated.  Review of Systems  Musculoskeletal:       Right shoulder pain and decreased ROM    Per HPI unless specifically indicated above     Objective:    BP 122/79   Pulse 72   Temp 98 F (36.7 C)   SpO2 93%   Wt Readings from Last 3 Encounters:  04/17/21 205 lb (93 kg)  04/13/21 205 lb (93 kg)  03/17/21 206 lb 6.4 oz (93.6 kg)    Physical Exam Vitals and nursing note reviewed.  Constitutional:      General: He is not in acute distress.    Appearance: Normal appearance. He is not ill-appearing, toxic-appearing or diaphoretic.  HENT:     Head: Normocephalic.     Right Ear: External ear normal.     Left Ear: External ear normal.     Nose: Nose normal. No congestion or rhinorrhea.     Mouth/Throat:     Mouth: Mucous membranes are moist.  Eyes:     General:        Right eye: No discharge.        Left eye: No discharge.     Extraocular Movements: Extraocular movements intact.     Conjunctiva/sclera:  Conjunctivae normal.     Pupils: Pupils are equal, round, and reactive to light.  Cardiovascular:     Rate and Rhythm: Normal rate and regular rhythm.     Heart sounds: No murmur heard.   Pulmonary:     Effort: Pulmonary effort is normal. No respiratory distress.     Breath sounds: Normal breath sounds. No wheezing, rhonchi or rales.  Abdominal:     General: Abdomen is flat. Bowel sounds are normal.  Musculoskeletal:     Right shoulder: No swelling or deformity. Decreased range of motion (pain with passive ROM and decreased passive ROM). Decreased strength.     Left shoulder: Normal.     Cervical back: Normal range of motion and neck supple.  Skin:    General: Skin is warm and dry.     Capillary Refill: Capillary refill takes less than 2 seconds.  Neurological:     General: No focal deficit present.     Mental Status: He is alert and oriented to person, place, and time.  Psychiatric:        Mood and Affect: Mood normal.        Behavior: Behavior normal.        Thought Content: Thought content normal.  Judgment: Judgment normal.     Results for orders placed or performed in visit on 03/17/21  Bayer DCA Hb A1c Waived  Result Value Ref Range   HB A1C (BAYER DCA - WAIVED) 7.1 (H) <7.0 %  Microalbumin, Urine Waived  Result Value Ref Range   Microalb, Ur Waived 10 0 - 19 mg/L   Creatinine, Urine Waived 200 10 - 300 mg/dL   Microalb/Creat Ratio <30 <30 mg/g  Comprehensive metabolic panel  Result Value Ref Range   Glucose 109 (H) 65 - 99 mg/dL   BUN 20 8 - 27 mg/dL   Creatinine, Ser 1.25 0.76 - 1.27 mg/dL   eGFR 62 >59 mL/min/1.73   BUN/Creatinine Ratio 16 10 - 24   Sodium 143 134 - 144 mmol/L   Potassium 3.9 3.5 - 5.2 mmol/L   Chloride 100 96 - 106 mmol/L   CO2 24 20 - 29 mmol/L   Calcium 10.2 8.6 - 10.2 mg/dL   Total Protein 6.9 6.0 - 8.5 g/dL   Albumin 5.2 (H) 3.8 - 4.8 g/dL   Globulin, Total 1.7 1.5 - 4.5 g/dL   Albumin/Globulin Ratio 3.1 (H) 1.2 - 2.2    Bilirubin Total 0.3 0.0 - 1.2 mg/dL   Alkaline Phosphatase 56 44 - 121 IU/L   AST 26 0 - 40 IU/L   ALT 31 0 - 44 IU/L  Lipid Panel w/o Chol/HDL Ratio  Result Value Ref Range   Cholesterol, Total 126 100 - 199 mg/dL   Triglycerides 162 (H) 0 - 149 mg/dL   HDL 33 (L) >39 mg/dL   VLDL Cholesterol Cal 28 5 - 40 mg/dL   LDL Chol Calc (NIH) 65 0 - 99 mg/dL  Vitamin B12  Result Value Ref Range   Vitamin B-12 1,720 (H) 232 - 1,245 pg/mL  PSA  Result Value Ref Range   Prostate Specific Ag, Serum 0.4 0.0 - 4.0 ng/mL      Assessment & Plan:   Problem List Items Addressed This Visit   None   Visit Diagnoses    Chronic right shoulder pain    -  Primary       Follow up plan: Return if symptoms worsen or fail to improve.

## 2021-05-18 ENCOUNTER — Encounter: Payer: Self-pay | Admitting: Nurse Practitioner

## 2021-05-18 ENCOUNTER — Other Ambulatory Visit: Payer: Self-pay

## 2021-05-18 ENCOUNTER — Ambulatory Visit (INDEPENDENT_AMBULATORY_CARE_PROVIDER_SITE_OTHER): Payer: Medicare HMO | Admitting: Nurse Practitioner

## 2021-05-18 VITALS — BP 122/79 | HR 72 | Temp 98.0°F

## 2021-05-18 DIAGNOSIS — M25511 Pain in right shoulder: Secondary | ICD-10-CM

## 2021-05-18 DIAGNOSIS — G8929 Other chronic pain: Secondary | ICD-10-CM

## 2021-05-18 MED ORDER — TRIAMCINOLONE ACETONIDE 40 MG/ML IJ SUSP
40.0000 mg | Freq: Once | INTRAMUSCULAR | Status: AC
  Administered 2021-05-18: 40 mg via INTRAMUSCULAR

## 2021-05-24 ENCOUNTER — Ambulatory Visit: Payer: Medicare HMO | Admitting: Nurse Practitioner

## 2021-06-08 ENCOUNTER — Other Ambulatory Visit: Payer: Self-pay | Admitting: Internal Medicine

## 2021-06-12 ENCOUNTER — Other Ambulatory Visit: Payer: Self-pay | Admitting: Nurse Practitioner

## 2021-06-12 NOTE — Telephone Encounter (Signed)
Requested Prescriptions  Pending Prescriptions Disp Refills  . metFORMIN (GLUCOPHAGE) 1000 MG tablet [Pharmacy Med Name: METFORMIN HCL 1,000 MG TABLET] 180 tablet 1    Sig: Take 1 tablet (1,000 mg total) by mouth 2 (two) times daily with a meal.     Endocrinology:  Diabetes - Biguanides Passed - 06/12/2021  3:58 PM      Passed - Cr in normal range and within 360 days    Creatinine, Ser  Date Value Ref Range Status  03/17/2021 1.25 0.76 - 1.27 mg/dL Final         Passed - HBA1C is between 0 and 7.9 and within 180 days    Hemoglobin A1C  Date Value Ref Range Status  09/17/2016 5.9%  Final   HB A1C (BAYER DCA - WAIVED)  Date Value Ref Range Status  03/17/2021 7.1 (H) <7.0 % Final    Comment:                                          Diabetic Adult            <7.0                                       Healthy Adult        4.3 - 5.7                                                           (DCCT/NGSP) American Diabetes Association's Summary of Glycemic Recommendations for Adults with Diabetes: Hemoglobin A1c <7.0%. More stringent glycemic goals (A1c <6.0%) may further reduce complications at the cost of increased risk of hypoglycemia.          Passed - AA eGFR in normal range and within 360 days    GFR calc Af Amer  Date Value Ref Range Status  08/23/2020 71 >59 mL/min/1.73 Final    Comment:    **[facility] currently reports eGFR in compliance with the current**   recommendations of the [employer]. [facility] will   update reporting as new guidelines are published from the NKF-ASN   Task force.    GFR calc non Af Amer  Date Value Ref Range Status  08/23/2020 61 >59 mL/min/1.73 Final   eGFR  Date Value Ref Range Status  03/17/2021 62 >59 mL/min/1.73 Final         Passed - Valid encounter within last 6 months    Recent Outpatient Visits          3 weeks ago Chronic right shoulder pain   [facility] Jon Billings, NP   2 months ago Controlled  type 2 diabetes with neuropathy ([place]   [place], [name], NP   4 months ago Weakness of right lower extremity   [place], [name], NP   6 months ago Controlled type 2 diabetes with neuropathy ([place])   [place], [name], NP   9 months ago Controlled type 2 diabetes with neuropathy [facility])   [facility] [place], [name], NP  Future Appointments            In 4 days [name], [name], NP [employer]   In 91 months  [employer], [place]

## 2021-06-16 ENCOUNTER — Other Ambulatory Visit: Payer: Self-pay

## 2021-06-16 ENCOUNTER — Encounter: Payer: Self-pay | Admitting: Nurse Practitioner

## 2021-06-16 ENCOUNTER — Ambulatory Visit (INDEPENDENT_AMBULATORY_CARE_PROVIDER_SITE_OTHER): Payer: Medicare HMO | Admitting: Nurse Practitioner

## 2021-06-16 VITALS — BP 125/77 | HR 64 | Temp 98.2°F | Wt 198.6 lb

## 2021-06-16 DIAGNOSIS — G8929 Other chronic pain: Secondary | ICD-10-CM

## 2021-06-16 DIAGNOSIS — N400 Enlarged prostate without lower urinary tract symptoms: Secondary | ICD-10-CM

## 2021-06-16 DIAGNOSIS — M25511 Pain in right shoulder: Secondary | ICD-10-CM

## 2021-06-16 DIAGNOSIS — Z6825 Body mass index (BMI) 25.0-25.9, adult: Secondary | ICD-10-CM

## 2021-06-16 DIAGNOSIS — E1140 Type 2 diabetes mellitus with diabetic neuropathy, unspecified: Secondary | ICD-10-CM | POA: Diagnosis not present

## 2021-06-16 DIAGNOSIS — I5032 Chronic diastolic (congestive) heart failure: Secondary | ICD-10-CM | POA: Diagnosis not present

## 2021-06-16 DIAGNOSIS — I700 Atherosclerosis of aorta: Secondary | ICD-10-CM

## 2021-06-16 DIAGNOSIS — E785 Hyperlipidemia, unspecified: Secondary | ICD-10-CM

## 2021-06-16 DIAGNOSIS — J432 Centrilobular emphysema: Secondary | ICD-10-CM | POA: Diagnosis not present

## 2021-06-16 DIAGNOSIS — E1169 Type 2 diabetes mellitus with other specified complication: Secondary | ICD-10-CM | POA: Diagnosis not present

## 2021-06-16 DIAGNOSIS — I2510 Atherosclerotic heart disease of native coronary artery without angina pectoris: Secondary | ICD-10-CM | POA: Diagnosis not present

## 2021-06-16 LAB — BAYER DCA HB A1C WAIVED: HB A1C (BAYER DCA - WAIVED): 7.2 % — ABNORMAL HIGH (ref <7.0)

## 2021-06-16 MED ORDER — DAPAGLIFLOZIN PROPANEDIOL 10 MG PO TABS: 10.0000 mg | ORAL_TABLET | Freq: Every day | ORAL | 2 refills | Status: DC

## 2021-06-16 NOTE — Patient Instructions (Signed)

## 2021-06-16 NOTE — Assessment & Plan Note (Signed)
Chronic, ongoing.  Continue current medication regimen and adjust as needed.  Lipid panel up to date.  Recent LDL <70. 

## 2021-06-16 NOTE — Assessment & Plan Note (Signed)
Noted on CT imaging.  Continue ASA and Lipitor daily for prevention.  Quit smoking 2 years ago.   °

## 2021-06-16 NOTE — Assessment & Plan Note (Signed)
Chronic, ongoing.  A1C 7.2%, increase from previous. Urine ALB 10 and A:C <29 March 2021. Continue current medication regimen and checking BS at home daily.  Discussed addition of Jardiance or Farxiga, will start Farxiga 10 MG at this time daily -- will offer benefit to diabetes and heart, if difficulty obtaining refer to [facility].  Continue Gabapentin as prescribed by podiatry and adjust as needed.  Return in 3 months for follow-up.

## 2021-06-16 NOTE — Assessment & Plan Note (Signed)
Continue collaboration with cardiology.  

## 2021-06-16 NOTE — Assessment & Plan Note (Signed)
Chronic, stable.  Continue current medication regimen, Flomax, and adjust as needed. °

## 2021-06-16 NOTE — Progress Notes (Signed)
BP 125/77   Pulse 64   Temp 98.2 F (36.8 C) (Oral)   Wt 198 lb 9.6 oz (90.1 kg)   SpO2 97%   BMI 24.82 kg/m    Subjective:    Patient ID: Brendan [name], male    DOB: 1951/06/18, 70 y.o.   MRN: [id number]  HPI: Brendan [name] is a 70 y.o. male  Chief Complaint  Patient presents with   Diabetes   Hyperlipidemia    Patient states he would like to discuss diabetes medication and states he would like to discuss adding another medication.    Hypertension   COPD   Benign Prostatic Hypertrophy   Shoulder Pain    Patient states he recently had a Cortisone injection and states the medication is wearing off and his pain is coming back.    DIABETES Last A1C 7.1%.  Continues on Metformin 1000 MG BID.  Was started on Gabapentin 300 MG QHS on 05/09/20 by podiatry for neuropathy.  Hypoglycemic episodes:no Polydipsia/polyuria: no Visual disturbance: no Chest pain: no Paresthesias: no Glucose Monitoring: yes             Accucheck frequency: Daily             Fasting glucose: 108 to             Post prandial:              Evening:             Before meals: Taking Insulin?: no             Long acting insulin:             Short acting insulin: Blood Pressure Monitoring: not checking Retinal Examination: Up To Date -- 10/03/20 Foot Exam: Up to Date Pneumovax: Up to Date Influenza: Up to Date Aspirin: yes    HYPERTENSION / HYPERLIPIDEMIA Continues on Metoprolol, Lasix, [place], Amlodipine, K+, and Lipitor. Followed by cardiology and last seen by Dr. Saunders Revel on 04/13/21.  Allergic to Lisinopril. Aortic atherosclerosis noted on 06/30/20 lung CT screening.  Last echo showed EF 60-65% in April 2021. Had repeat testing wall motion testing on 05/02/20. Satisfied with current treatment? yes Duration of hypertension: chronic BP monitoring frequency: a few days a week BP range: 110-120/70-80's range BP medication side effects: no Duration of hyperlipidemia: chronic Cholesterol medication side effects:  no Cholesterol supplements: none Medication compliance: good compliance Aspirin: yes Recent stressors: no Recurrent headaches: no Visual changes: no Palpitations: no Dyspnea: no Chest pain: no Lower extremity edema: no Dizzy/lightheaded: no    COPD Has not smoked in 3 years since his bypass surgery.  FEV1 62% and FEV1/FVC 72% in May 2021 and today FEV1 64% and FEV1/FVC 73%.  Has Singulair he takes daily.  Has Albuterol inhaler, but does not use. Last Lung CT screening 07/01/20 -- noted aortic atherosclerosis which was noted on past imaging and has been discussed with patient + emphysema and hepatic steatosis.   COPD status: stable Satisfied with current treatment?: yes Oxygen use: no Dyspnea frequency: none Cough frequency: none Rescue inhaler frequency: once this year Limitation of activity: no Productive cough: none Last Spirometry: June 2022 Pneumovax: Up to Date Influenza: Up to Date   BPH Continues on Flomax daily. BPH status: stable Satisfied with current treatment?: yes Medication side effects: no Medication compliance: good compliance Duration: chronic Nocturia: 1/night Urinary frequency:no Incomplete voiding: no Urgency: no Weak urinary stream: no Straining to start stream: no Dysuria: no Onset: gradual  Severity: mild Alleviating factors: Flomax Aggravating factors: unknown Treatments attempted: Flomax AUA symptom score: 2   SHOULDER PAIN Had Cortisone injection 05/18/21 in office, this helped for a short period (one month), but now pain is returning to right shoulder.  Hurting when he raised arms up.   Duration: weeks Involved shoulder: right Mechanism of injury: unknown Location: lateral Onset:gradual Severity: 5/10  Quality:  sharp, dull, and aching Frequency: intermittent Radiation: no Aggravating factors: lifting and movement  Alleviating factors:  cortisone shot and rest  Status: worse Treatments attempted:  cortisone shot, rest, ice, and heat   Relief with NSAIDs?:  No NSAIDs Taken Weakness: no Numbness: no Decreased grip strength: no Redness: no Swelling: no Bruising: no Fevers: no   Relevant past medical, surgical, family and social history reviewed and updated as indicated. Interim medical history since our last visit reviewed. Allergies and medications reviewed and updated.  Review of Systems  Constitutional:  Negative for activity change, diaphoresis, fatigue and fever.  Respiratory:  Negative for cough, chest tightness, shortness of breath and wheezing.   Cardiovascular:  Negative for chest pain, palpitations and leg swelling.  Gastrointestinal: Negative.   Endocrine: Negative for cold intolerance, heat intolerance, polydipsia, polyphagia and polyuria.  Musculoskeletal:  Positive for arthralgias.  Neurological: Negative.   Psychiatric/Behavioral: Negative.     Per HPI unless specifically indicated above     Objective:    BP 125/77   Pulse 64   Temp 98.2 F (36.8 C) (Oral)   Wt 198 lb 9.6 oz (90.1 kg)   SpO2 97%   BMI 24.82 kg/m   Wt Readings from Last 3 Encounters:  06/16/21 198 lb 9.6 oz (90.1 kg)  04/17/21 205 lb (93 kg)  04/13/21 205 lb (93 kg)    Physical Exam Vitals and nursing note reviewed.  Constitutional:      General: He is awake. He is not in acute distress.    Appearance: He is well-developed and overweight. He is not ill-appearing.  HENT:     Head: Normocephalic and atraumatic.     Right Ear: Hearing normal. No drainage.     Left Ear: Hearing normal. No drainage.  Eyes:     General: Lids are normal.        Right eye: No discharge.        Left eye: No discharge.     Conjunctiva/sclera: Conjunctivae normal.     Pupils: Pupils are equal, round, and reactive to light.  Neck:     Vascular: No carotid bruit.  Cardiovascular:     Rate and Rhythm: Normal rate and regular rhythm.     Heart sounds: Normal heart sounds, S1 normal and S2 normal. No murmur heard.   No gallop.  Pulmonary:      Effort: Pulmonary effort is normal. No accessory muscle usage or respiratory distress.     Breath sounds: Normal breath sounds.  Abdominal:     General: Bowel sounds are normal.     Palpations: Abdomen is soft.     Tenderness: There is no abdominal tenderness.  Musculoskeletal:     Right shoulder: Tenderness present. No swelling, laceration or crepitus. Decreased range of motion. Decreased strength.     Left shoulder: Normal.     Cervical back: Normal range of motion and neck supple.     Right lower leg: No edema.     Left lower leg: No edema.  Skin:    General: Skin is warm and dry.  Neurological:  Mental Status: He is alert and oriented to person, place, and time.  Psychiatric:        Attention and Perception: Attention normal.        Mood and Affect: Mood normal.        Speech: Speech normal.        Behavior: Behavior normal. Behavior is cooperative.         Shoulder: right    Inspection:  no swelling, ecchymosis, erythema or step off deformity.        Tenderness to Palpation:    Acromion: no    AC joint:no    Clavicle: no    Bicipital groove: no    Scapular spine: no    Coracoid process: no    Humeral head: yes    Supraspinatus tendon: yes     Range of Motion:  decreased    Abduction:Decreased    Adduction: Decreased    Flexion: Normal    Extension: Normal    Internal rotation: Decreased    External rotation: Decreased    Painful arc: no     Muscle Strength:  decreased    Flexion: Decreased    Extension: Decreased    Abduction: Decreased    Adduction: Decreased    External rotation: Normal    Internal rotation: Normal     Neuro: Sensation WNL. and Upper extremity reflexes WNL.     Special Tests:     Neer sign: Negative    Hawkins sign: Positive    Cross arm adduction: Negative    Yergason sign: Negative    O'brien sign: Negative     Speed sign: Negative  Results for orders placed or performed in visit on 03/17/21  Bayer DCA Hb A1c Waived   Result Value Ref Range   HB A1C (BAYER DCA - WAIVED) 7.1 (H) <7.0 %  Microalbumin, Urine Waived  Result Value Ref Range   Microalb, Ur Waived 10 0 - 19 mg/L   Creatinine, Urine Waived 200 10 - 300 mg/dL   Microalb/Creat Ratio <30 <30 mg/g  Comprehensive metabolic panel  Result Value Ref Range   Glucose 109 (H) 65 - 99 mg/dL   BUN 20 8 - 27 mg/dL   Creatinine, Ser 1.25 0.76 - 1.27 mg/dL   eGFR 62 >59 mL/min/1.73   BUN/Creatinine Ratio 16 10 - 24   Sodium 143 134 - 144 mmol/L   Potassium 3.9 3.5 - 5.2 mmol/L   Chloride 100 96 - 106 mmol/L   CO2 24 20 - 29 mmol/L   Calcium 10.2 8.6 - 10.2 mg/dL   Total Protein 6.9 6.0 - 8.5 g/dL   Albumin 5.2 (H) 3.8 - 4.8 g/dL   Globulin, Total 1.7 1.5 - 4.5 g/dL   Albumin/Globulin Ratio 3.1 (H) 1.2 - 2.2   Bilirubin Total 0.3 0.0 - 1.2 mg/dL   Alkaline Phosphatase 56 44 - 121 IU/L   AST 26 0 - 40 IU/L   ALT 31 0 - 44 IU/L  Lipid Panel w/o Chol/HDL Ratio  Result Value Ref Range   Cholesterol, Total 126 100 - 199 mg/dL   Triglycerides 162 (H) 0 - 149 mg/dL   HDL 33 (L) >39 mg/dL   VLDL Cholesterol Cal 28 5 - 40 mg/dL   LDL Chol Calc (NIH) 65 0 - 99 mg/dL  Vitamin B12  Result Value Ref Range   Vitamin B-12 1,720 (H) 232 - 1,245 pg/mL  PSA  Result Value Ref Range   Prostate Specific Ag, Serum  0.4 0.0 - 4.0 ng/mL      Assessment & Plan:   Problem List Items Addressed This Visit       Cardiovascular and Mediastinum   Coronary artery disease    Continue collaboration with cardiology.       Aortic atherosclerosis ([place])    Noted on CT imaging.  Continue ASA and Lipitor daily for prevention.  Quit smoking 2 years ago.         Chronic heart failure with preserved ejection fraction (HFpEF) (HCC)    Chronic, stable.  Euvolemic.  Continue collaboration with cardiology and current medication regimen as ordered by them.  Add on Farxiga today due to A1c 7.2% -- will benefit heart and diabetes.  Recommend: - Reminded to call for an  overnight weight gain of >2 pounds or a weekly weight weight of >5 pounds - not adding salt to his food and has been reading food labels. Reviewed the importance of keeping daily sodium intake to <2050m daily           Respiratory   Centrilobular emphysema (HCC)    Chronic, stable with minimal use of Albuterol.  PFT recently noting minimal change from previous with FEV1 64% and FEV1/FVC 73% today, remaining stable from previous.  Continue to monitor and adjust regimen as needed.  Continue Albuterol and recommend he use this if wheezing present or SOB.  Continue cessation of smoking and continue yearly lung screening.       Relevant Orders   Spirometry with graph (Completed)     Endocrine   [place]    Chronic, ongoing.  A1C 7.2%, increase from previous. Urine ALB 10 and A:C <29 March 2021. Continue current medication regimen and checking BS at home daily.  Discussed addition of Jardiance or Farxiga, will start Farxiga 10 MG at this time daily -- will offer benefit to diabetes and heart, [facility]  Continue Gabapentin as prescribed by podiatry and adjust as needed.  Return in 3 months for follow-up.          Relevant Medications   dapagliflozin propanediol (FARXIGA) 10 MG TABS tablet   Other Relevant Orders   Bayer DCA Hb A1c Waived   Hyperlipidemia associated with type 2 diabetes mellitus (HCC)    Chronic, ongoing.  Continue current medication regimen and adjust as needed.  Lipid panel up to date.  Recent LDL <70.       Relevant Medications   dapagliflozin propanediol (FARXIGA) 10 MG TABS tablet     Genitourinary   BPH (benign prostatic hyperplasia)    Chronic, stable.  Continue current medication regimen, Flomax, and adjust as needed.         Other   Chronic right shoulder pain    Ongoing issue with minimal benefit from steroid injection.  Has not obtained imaging as of yet.  Referral to  ortho placed.       Relevant Orders   Ambulatory referral to Orthopedics   BMI 25.0-25.9,adult    Recommended eating smaller high protein, low fat meals more frequently and exercising 30 mins a day 5 times a week with a goal of 10-15lb weight loss in the next 3 months. Patient voiced their understanding and motivation to adhere to these recommendations.          Follow up plan: Return in about 3 months (around 09/16/2021) for T2DM, HTN/HLD, COPD.

## 2021-06-16 NOTE — Assessment & Plan Note (Signed)
Chronic, stable with minimal use of Albuterol.  PFT recently noting minimal change from previous with FEV1 64% and FEV1/FVC 73% today, remaining stable from previous.  Continue to monitor and adjust regimen as needed.  Continue Albuterol and recommend he use this if wheezing present or SOB.  Continue cessation of smoking and continue yearly lung screening.

## 2021-06-16 NOTE — Assessment & Plan Note (Signed)
Chronic, ongoing with BP at goal today and at goal on home readings.  Continue current medication regimen and adjust as needed.  Continue collaboration with cardiology.  Recommend he monitor BP at least a few times a week at home and document.  DASH diet focus.  Labs: CMP next visit.  Return in 3 months.

## 2021-06-16 NOTE — Assessment & Plan Note (Signed)
Chronic, stable.  Euvolemic.  Continue collaboration with cardiology and current medication regimen as ordered by them.  Add on Farxiga today due to A1c 7.2% -- will benefit heart and diabetes.  Recommend: - Reminded to call for an overnight weight gain of >2 pounds or a weekly weight weight of >5 pounds - not adding salt to his food and has been reading food labels. Reviewed the importance of keeping daily sodium intake to 2000mg  daily

## 2021-06-16 NOTE — Assessment & Plan Note (Signed)
Recommended eating smaller high protein, low fat meals more frequently and exercising 30 mins a day 5 times a week with a goal of 10-15lb weight loss in the next 3 months. Patient voiced their understanding and motivation to adhere to these recommendations.  

## 2021-06-16 NOTE — Assessment & Plan Note (Signed)
Ongoing issue with minimal benefit from steroid injection.  Has not obtained imaging as of yet.  Referral to ortho placed.

## 2021-06-28 DIAGNOSIS — M7591 Shoulder lesion, unspecified, right shoulder: Secondary | ICD-10-CM | POA: Diagnosis not present

## 2021-06-29 ENCOUNTER — Other Ambulatory Visit: Payer: Self-pay | Admitting: Nurse Practitioner

## 2021-07-10 ENCOUNTER — Telehealth: Payer: Self-pay | Admitting: *Deleted

## 2021-07-10 DIAGNOSIS — Z122 Encounter for screening for malignant neoplasm of respiratory organs: Secondary | ICD-10-CM

## 2021-07-10 DIAGNOSIS — Z87891 Personal history of nicotine dependence: Secondary | ICD-10-CM

## 2021-07-10 NOTE — Telephone Encounter (Signed)
Patient returned my call re: scheduling the annual lung screening scan. Former smoker, quit in 2018, 60 pack yrs. Scan scheduled for 07/20/21 @ 2:00 pm.

## 2021-07-10 NOTE — Telephone Encounter (Signed)
Attempted to reach patient via telephone number listed in Epic re: scheduling his annual low dose lung screening ct scan. Unable to leave message, no voicemail.

## 2021-07-20 ENCOUNTER — Other Ambulatory Visit: Payer: Self-pay

## 2021-07-20 ENCOUNTER — Ambulatory Visit
Admission: RE | Admit: 2021-07-20 | Discharge: 2021-07-20 | Disposition: A | Discharge status: Home / Self Care | Payer: Medicare HMO | Source: Ambulatory Visit | Attending: Acute Care | Admitting: Acute Care

## 2021-07-20 DIAGNOSIS — Z122 Encounter for screening for malignant neoplasm of respiratory organs: Secondary | ICD-10-CM | POA: Insufficient documentation

## 2021-07-20 DIAGNOSIS — Z87891 Personal history of nicotine dependence: Secondary | ICD-10-CM | POA: Insufficient documentation

## 2021-08-03 NOTE — Progress Notes (Signed)
Please call patient and let them  know their  low dose Ct was read as a Lung RADS 1, negative study: no nodules or definitely benign nodules. Radiology recommendation is for a repeat LDCT in 12 months. .Please let them  know we will order and schedule their  annual screening scan for 06/2022. Please let them  know there was notation of CAD on their  scan.  Please remind the patient  that this is a non-gated exam therefore degree or severity of disease  cannot be determined. Please have them  follow up with their PCP regarding potential risk factor modification, dietary therapy or pharmacologic therapy if clinically indicated. Pt.  is  currently on statin therapy. Please place order for annual  screening scan for  06/2022 and fax results to PCP. Thanks so much.  Please let patient know there was notation of hepatic steatosis on his scan. Hepatic steatosis. This  is a term that describes the build up of fat in the liver. It is normal to have small amounts of fat in your liver, but when the proportion of liver cells that contain fat exceeds more than 5% it is indicative of early stage fatty liver.Treatment often involves reducing risk factors through a diet and exercise plan. It is generally a benign condition, but in a small percentage of patients it does require follow up. Please have the patient follow up with PCP regarding potential risk factor modification, dietary therapy or pharmacologic therapy if clinically indicated.  There was also notation of aortic atherosclerosis.  Please have the patient follow-up with their PCP regarding further follow-up as may be clinically indicated.

## 2021-08-11 ENCOUNTER — Other Ambulatory Visit: Payer: Self-pay | Admitting: Podiatry

## 2021-08-11 NOTE — Telephone Encounter (Signed)
Please advise 

## 2021-08-16 ENCOUNTER — Other Ambulatory Visit: Payer: Self-pay | Admitting: Nurse Practitioner

## 2021-08-16 NOTE — Telephone Encounter (Signed)
Notes to clinic:  Medication just filled today  Review for future refills   Requested Prescriptions  Pending Prescriptions Disp Refills   FARXIGA 10 MG TABS tablet [Pharmacy Med Name: FARXIGA 10 MG TABLET] 30 tablet 0    Sig: Take 1 tablet (10 mg total) by mouth daily before breakfast.     Endocrinology:  Diabetes - SGLT2 Inhibitors Passed - 08/16/2021 12:20 PM      Passed - Cr in normal range and within 360 days    Creatinine, Ser  Date Value Ref Range Status  03/17/2021 1.25 0.76 - 1.27 mg/dL Final          Passed - LDL in normal range and within 360 days    LDL Chol Calc (NIH)  Date Value Ref Range Status  03/17/2021 65 0 - 99 mg/dL Final          Passed - HBA1C is between 0 and 7.9 and within 180 days    Hemoglobin A1C  Date Value Ref Range Status  09/17/2016 5.9%  Final   HB A1C (BAYER DCA - WAIVED)  Date Value Ref Range Status  06/16/2021 7.2 (H) <7.0 % Final    Comment:                                          Diabetic Adult            <7.0                                       Healthy Adult        4.3 - 5.7                                                           (DCCT/NGSP) American Diabetes Association's Summary of Glycemic Recommendations for Adults with Diabetes: Hemoglobin A1c <7.0%. More stringent glycemic goals (A1c <6.0%) may further reduce complications at the cost of increased risk of hypoglycemia.           Passed - AA eGFR in normal range and within 360 days    GFR calc Af Amer  Date Value Ref Range Status  08/23/2020 71 >59 mL/min/1.73 Final    Comment:    **[facility] currently reports eGFR in compliance with the current**   recommendations of the [employer]. [facility] will   update reporting as new guidelines are published from the NKF-ASN   Task force.    GFR calc non Af Amer  Date Value Ref Range Status  08/23/2020 61 >59 mL/min/1.73 Final   eGFR  Date Value Ref Range Status  03/17/2021 62 >59 mL/min/1.73 Final           Passed - Valid encounter within last 6 months    Recent Outpatient Visits           2 months ago Controlled type 2 diabetes with neuropathy ([place])   [place], [place] T, NP   3 months ago Chronic right shoulder pain   [place], NP   5 months ago Controlled type 2 diabetes with neuropathy ([place])  [facility] [place], [place] T, NP   7 months ago Weakness of right lower extremity   [place], [name], NP   8 months ago Controlled type 2 diabetes with neuropathy [facility])   [place], [name], NP       Future Appointments             In 1 month [name], [name], NP [facility]   In 8 months  [facility]

## 2021-08-23 ENCOUNTER — Encounter: Payer: Self-pay | Admitting: Podiatry

## 2021-08-23 ENCOUNTER — Other Ambulatory Visit: Payer: Self-pay

## 2021-08-23 ENCOUNTER — Ambulatory Visit: Payer: Medicare HMO | Admitting: Podiatry

## 2021-08-23 DIAGNOSIS — M2041 Other hammer toe(s) (acquired), right foot: Secondary | ICD-10-CM

## 2021-08-23 DIAGNOSIS — M2042 Other hammer toe(s) (acquired), left foot: Secondary | ICD-10-CM | POA: Diagnosis not present

## 2021-08-23 DIAGNOSIS — E1142 Type 2 diabetes mellitus with diabetic polyneuropathy: Secondary | ICD-10-CM | POA: Diagnosis not present

## 2021-08-23 DIAGNOSIS — I739 Peripheral vascular disease, unspecified: Secondary | ICD-10-CM | POA: Diagnosis not present

## 2021-08-23 MED ORDER — GABAPENTIN 300 MG PO CAPS: ORAL_CAPSULE | ORAL | 3 refills | Status: DC

## 2021-08-23 NOTE — Progress Notes (Signed)
Patient presents to the office today for diabetic shoe and insole measuring.  Patient was measured with brannock device to determine size and width for 1 pair of extra depth shoes and foam casted for 3 pair of insoles.   Documentation of medical necessity will be sent to patient's treating diabetic doctor to verify and sign.   Patient's diabetic provider: Dr. Cristal [place] Aura Dials, NP)  Shoes and insoles will be ordered at that time and patient will be notified for an appointment for fitting when they arrive.   Shoe size (per patient): 14   Brannock measurement: RIGHT - 12.5 D, LEFT: 13.5 C  Patient shoe selection-   1st choice:   Apex V854M  2nd choice:  Apex [id number]  Shoe size ordered: Men's 14 Wide

## 2021-08-24 ENCOUNTER — Encounter: Payer: Self-pay | Admitting: *Deleted

## 2021-08-24 DIAGNOSIS — Z87891 Personal history of nicotine dependence: Secondary | ICD-10-CM

## 2021-09-06 ENCOUNTER — Other Ambulatory Visit: Payer: Self-pay | Admitting: Internal Medicine

## 2021-09-06 ENCOUNTER — Other Ambulatory Visit: Payer: Self-pay | Admitting: Nurse Practitioner

## 2021-09-07 NOTE — Telephone Encounter (Signed)
Requested Prescriptions  Pending Prescriptions Disp Refills  . atorvastatin (LIPITOR) 20 MG tablet [Pharmacy Med Name: ATORVASTATIN 20 MG TABLET] 90 tablet 0    Sig: Take 1 tablet (20 mg total) by mouth daily.     Cardiovascular:  Antilipid - Statins Failed - 09/06/2021  3:47 PM      Failed - HDL in normal range and within 360 days    HDL  Date Value Ref Range Status  03/17/2021 33 (L) >39 mg/dL Final         Failed - Triglycerides in normal range and within 360 days    Triglycerides  Date Value Ref Range Status  03/17/2021 162 (H) 0 - 149 mg/dL Final   Triglycerides Piccolo,Waived  Date Value Ref Range Status  02/29/2016 73 <150 mg/dL Final    Comment:                            Normal                   <150                         Borderline High     150 - 199                         High                200 - 499                         Very High                >499          Passed - Total Cholesterol in normal range and within 360 days    Cholesterol, Total  Date Value Ref Range Status  03/17/2021 126 100 - 199 mg/dL Final   Cholesterol Piccolo, Waived  Date Value Ref Range Status  02/29/2016 111 <200 mg/dL Final    Comment:                            Desirable                <200                         Borderline High      200- 239                         High                     >239          Passed - LDL in normal range and within 360 days    LDL Chol Calc (NIH)  Date Value Ref Range Status  03/17/2021 65 0 - 99 mg/dL Final         Passed - Patient is not pregnant      Passed - Valid encounter within last 12 months    Recent Outpatient Visits          2 months ago Controlled type 2 diabetes with neuropathy (HCC)   [facility] [place], [name], NP   3 months ago Chronic right shoulder pain  [facility] Larae Grooms, NP   5 months ago Controlled type 2 diabetes with neuropathy [facility])   [facility] [place],  [place] T, NP   7 months ago Weakness of right lower extremity   [facility] [place], [name], NP   9 months ago Controlled type 2 diabetes with neuropathy (HCC)   [facility] [place], Corrie Dandy T, NP      Future Appointments            In 1 week [name], Dorie Rank, NP [facility]   In 7 months  [facility]           . metoprolol tartrate (LOPRESSOR) 25 MG tablet [Pharmacy Med Name: METOPROLOL TARTRATE 25 MG TAB] 90 tablet 0    Sig: Take 0.5 tablets (12.5 mg total) by mouth 2 (two) times daily.     Cardiovascular:  Beta Blockers Passed - 09/06/2021  3:47 PM      Passed - Last BP in normal range    BP Readings from Last 1 Encounters:  06/16/21 125/77         Passed - Last Heart Rate in normal range    Pulse Readings from Last 1 Encounters:  06/16/21 64         Passed - Valid encounter within last 6 months    Recent Outpatient Visits          2 months ago Controlled type 2 diabetes with neuropathy (HCC)   [facility] [place], [place] T, NP   3 months ago Chronic right shoulder pain   [facility] Larae Grooms, NP   5 months ago Controlled type 2 diabetes with neuropathy (HCC)   [facility] [name], [name], NP   7 months ago Weakness of right lower extremity   [facility] [name], [name], NP   9 months ago Controlled type 2 diabetes with neuropathy (HCC)   [facility] [name], Dorie Rank, NP      Future Appointments            In 1 week [name], Dorie Rank, NP [facility]   In 7 months  [facility]

## 2021-09-15 ENCOUNTER — Other Ambulatory Visit: Payer: Self-pay | Admitting: Nurse Practitioner

## 2021-09-16 NOTE — Telephone Encounter (Signed)
Requested Prescriptions  Pending Prescriptions Disp Refills  . FARXIGA 10 MG TABS tablet [Pharmacy Med Name: FARXIGA 10 MG TABLET] 30 tablet 0    Sig: Take 1 tablet (10 mg total) by mouth daily before breakfast.     Endocrinology:  Diabetes - SGLT2 Inhibitors Failed - 09/15/2021  3:16 PM      Failed - AA eGFR in normal range and within 360 days    GFR calc Af Amer  Date Value Ref Range Status  08/23/2020 71 >59 mL/min/1.73 Final    Comment:    **[facility] currently reports eGFR in compliance with the current**   recommendations of the [employer]. [facility] will   update reporting as new guidelines are published from the NKF-ASN   Task force.    GFR calc non Af Amer  Date Value Ref Range Status  08/23/2020 61 >59 mL/min/1.73 Final   eGFR  Date Value Ref Range Status  03/17/2021 62 >59 mL/min/1.73 Final         Passed - Cr in normal range and within 360 days    Creatinine, Ser  Date Value Ref Range Status  03/17/2021 1.25 0.76 - 1.27 mg/dL Final         Passed - LDL in normal range and within 360 days    LDL Chol Calc (NIH)  Date Value Ref Range Status  03/17/2021 65 0 - 99 mg/dL Final         Passed - HBA1C is between 0 and 7.9 and within 180 days    Hemoglobin A1C  Date Value Ref Range Status  09/17/2016 5.9%  Final   HB A1C (BAYER DCA - WAIVED)  Date Value Ref Range Status  06/16/2021 7.2 (H) <7.0 % Final    Comment:                                          Diabetic Adult            <7.0                                       Healthy Adult        4.3 - 5.7                                                           (DCCT/NGSP) American Diabetes Association's Summary of Glycemic Recommendations for Adults with Diabetes: Hemoglobin A1c <7.0%. More stringent glycemic goals (A1c <6.0%) may further reduce complications at the cost of increased risk of hypoglycemia.          Passed - Valid encounter within last 6 months    Recent Outpatient Visits           3 months ago Controlled type 2 diabetes with neuropathy ([place])   [place], [place] T, NP   4 months ago Chronic right shoulder pain   [place], NP   6 months ago Controlled type 2 diabetes with neuropathy [facility])   [place], [name], NP   8 months ago Weakness of right lower extremity  [facility] [place], Luther T, NP   9 months ago Controlled type 2 diabetes with neuropathy [facility])   [place], [name], NP      Future Appointments            In 2 days [name], [name], NP [facility]   In 7 months  [facility]

## 2021-09-18 ENCOUNTER — Encounter: Payer: Self-pay | Admitting: Nurse Practitioner

## 2021-09-18 ENCOUNTER — Ambulatory Visit (INDEPENDENT_AMBULATORY_CARE_PROVIDER_SITE_OTHER): Payer: Medicare HMO | Admitting: Nurse Practitioner

## 2021-09-18 ENCOUNTER — Other Ambulatory Visit: Payer: Self-pay

## 2021-09-18 VITALS — BP 128/74 | HR 60 | Temp 97.8°F | Wt 202.6 lb

## 2021-09-18 DIAGNOSIS — E1159 Type 2 diabetes mellitus with other circulatory complications: Secondary | ICD-10-CM | POA: Diagnosis not present

## 2021-09-18 DIAGNOSIS — E1140 Type 2 diabetes mellitus with diabetic neuropathy, unspecified: Secondary | ICD-10-CM | POA: Diagnosis not present

## 2021-09-18 DIAGNOSIS — K760 Fatty (change of) liver, not elsewhere classified: Secondary | ICD-10-CM

## 2021-09-18 DIAGNOSIS — I2510 Atherosclerotic heart disease of native coronary artery without angina pectoris: Secondary | ICD-10-CM | POA: Diagnosis not present

## 2021-09-18 DIAGNOSIS — J432 Centrilobular emphysema: Secondary | ICD-10-CM

## 2021-09-18 DIAGNOSIS — I152 Hypertension secondary to endocrine disorders: Secondary | ICD-10-CM

## 2021-09-18 DIAGNOSIS — I5032 Chronic diastolic (congestive) heart failure: Secondary | ICD-10-CM

## 2021-09-18 DIAGNOSIS — I700 Atherosclerosis of aorta: Secondary | ICD-10-CM | POA: Diagnosis not present

## 2021-09-18 DIAGNOSIS — E279 Disorder of adrenal gland, unspecified: Secondary | ICD-10-CM

## 2021-09-18 DIAGNOSIS — E785 Hyperlipidemia, unspecified: Secondary | ICD-10-CM

## 2021-09-18 DIAGNOSIS — E1169 Type 2 diabetes mellitus with other specified complication: Secondary | ICD-10-CM | POA: Diagnosis not present

## 2021-09-18 DIAGNOSIS — E278 Other specified disorders of adrenal gland: Secondary | ICD-10-CM | POA: Diagnosis not present

## 2021-09-18 DIAGNOSIS — Z23 Encounter for immunization: Secondary | ICD-10-CM

## 2021-09-18 DIAGNOSIS — Z87891 Personal history of nicotine dependence: Secondary | ICD-10-CM

## 2021-09-18 LAB — BAYER DCA HB A1C WAIVED: HB A1C (BAYER DCA - WAIVED): 7.3 % — ABNORMAL HIGH (ref 4.8–5.6)

## 2021-09-18 MED ORDER — FUROSEMIDE 20 MG PO TABS: 20.0000 mg | ORAL_TABLET | Freq: Every day | ORAL | 4 refills | Status: DC | PRN

## 2021-09-18 MED ORDER — ATORVASTATIN CALCIUM 20 MG PO TABS: 20.0000 mg | ORAL_TABLET | Freq: Every day | ORAL | 4 refills | Status: DC

## 2021-09-18 MED ORDER — DAPAGLIFLOZIN PROPANEDIOL 10 MG PO TABS: 10.0000 mg | ORAL_TABLET | Freq: Every day | ORAL | 4 refills | Status: DC

## 2021-09-18 MED ORDER — METOPROLOL TARTRATE 25 MG PO TABS: 12.5000 mg | ORAL_TABLET | Freq: Two times a day (BID) | ORAL | 4 refills | Status: DC

## 2021-09-18 MED ORDER — CHLORTHALIDONE 25 MG PO TABS: 25.0000 mg | ORAL_TABLET | Freq: Every day | ORAL | 4 refills | Status: DC

## 2021-09-18 MED ORDER — TAMSULOSIN HCL 0.4 MG PO CAPS: 0.4000 mg | ORAL_CAPSULE | Freq: Every day | ORAL | 4 refills | Status: DC

## 2021-09-18 MED ORDER — METFORMIN HCL 1000 MG PO TABS: 1000.0000 mg | ORAL_TABLET | Freq: Two times a day (BID) | ORAL | 4 refills | Status: DC

## 2021-09-18 NOTE — Assessment & Plan Note (Signed)
Left side, noted on CT lung screening, stable on recent check = possible adenoma reported.  Continue to monitor on screenings and send to nephrology as needed. °

## 2021-09-18 NOTE — Assessment & Plan Note (Addendum)
Chronic, ongoing.  A1C 7.3%, increase from previous due to dietary indiscretions. Urine ALB 10 and A:C <29 March 2021. Continue current medication regimen and checking BS at home daily.  Continue Metformin and Marcelline Deist as ordered, may need assist with Marcelline Deist in future.  Will not change medications today, recommend heavy focus on diet with less pasta and potatoes.  Continue Gabapentin as prescribed by podiatry and adjust as needed.  Return in 3 months for follow-up.

## 2021-09-18 NOTE — Assessment & Plan Note (Signed)
Continue annual lung screening.  Recommend continued cessation. ?

## 2021-09-18 NOTE — Assessment & Plan Note (Signed)
Continue collaboration with cardiology.  

## 2021-09-18 NOTE — Progress Notes (Signed)
BP 128/74   Pulse 60   Temp 97.8 F (36.6 C) (Oral)   Wt 202 lb 9.6 oz (91.9 kg)   SpO2 94%   BMI 25.32 kg/m    Subjective:    Patient ID: Brendan [name], male    DOB: [birth year], 70 y.o.   MRN: [id number]  HPI: Brendan [name] is a 70 y.o. male  Chief Complaint  Patient presents with   Diabetes    Patient is here for 3 month follow up. Patient Diabetic Eye Exam requested at today's visit.    Hyperlipidemia   Hypertension   COPD   DIABETES Last A1C 7.2%.  Continues on Metformin 1000 MG BID and Farixga 10 MG daily.  Continues on Gabapentin 300 MG QHS started on 05/09/20 by podiatry for neuropathy -- last saw 08/23/21.  Hypoglycemic episodes:no Polydipsia/polyuria: no Visual disturbance: no Chest pain: no Paresthesias: no Glucose Monitoring: yes             Accucheck frequency: Daily             Fasting glucose: 118 this afternoon             Post prandial:              Evening:             Before meals: Taking Insulin?: no             Long acting insulin:             Short acting insulin: Blood Pressure Monitoring: not checking Retinal Examination: Up To Date -- 10/03/2020 Foot Exam: Up to Date Pneumovax: Up to Date Influenza: Up to Date Aspirin: yes    HYPERTENSION / HYPERLIPIDEMIA Continues on Metoprolol, Lasix, Hygroton, Amlodipine, K+, and Lipitor. Followed by cardiology and last seen by Dr. Okey Dupre on 04/13/21.  Allergic to Lisinopril.  Last echo showed EF 60-65% in April 2021.  Satisfied with current treatment? yes Duration of hypertension: chronic BP monitoring frequency: a few days a week BP range: 110-120/70 range BP medication side effects: no Duration of hyperlipidemia: chronic Cholesterol medication side effects: no Cholesterol supplements: none Medication compliance: good compliance Aspirin: yes Recent stressors: no Recurrent headaches: no Visual changes: no Palpitations: no Dyspnea: no Chest pain: no Lower extremity edema: no Dizzy/lightheaded: no     COPD Has not smoked in 3 years since his bypass surgery. Has Singulair he takes daily.  Has Albuterol inhaler, but does not use. Last Lung CT screening 07/22/21 -- noted aortic atherosclerosis which was noted on past imaging and has been discussed with patient + emphysema and hepatic steatosis + left adrenal nodule which is stable. COPD status: stable Satisfied with current treatment?: yes Oxygen use: no Dyspnea frequency: none Cough frequency: none Rescue inhaler frequency: once this year Limitation of activity: no Productive cough: none Last Spirometry: June 2022 - FEV1 64% and FEV1/FVC 73% Pneumovax: Up to Date Influenza: Up to Date   Relevant past medical, surgical, family and social history reviewed and updated as indicated. Interim medical history since our last visit reviewed. Allergies and medications reviewed and updated.  Review of Systems  Constitutional:  Negative for activity change, diaphoresis, fatigue and fever.  Respiratory:  Negative for cough, chest tightness, shortness of breath and wheezing.   Cardiovascular:  Negative for chest pain, palpitations and leg swelling.  Gastrointestinal: Negative.   Endocrine: Negative for cold intolerance, heat intolerance, polydipsia, polyphagia and polyuria.  Neurological: Negative.   Psychiatric/Behavioral: Negative.  Per HPI unless specifically indicated above     Objective:    BP 128/74   Pulse 60   Temp 97.8 F (36.6 C) (Oral)   Wt 202 lb 9.6 oz (91.9 kg)   SpO2 94%   BMI 25.32 kg/m   Wt Readings from Last 3 Encounters:  09/18/21 202 lb 9.6 oz (91.9 kg)  07/20/21 200 lb (90.7 kg)  06/16/21 198 lb 9.6 oz (90.1 kg)    Physical Exam Vitals and nursing note reviewed.  Constitutional:      General: He is awake. He is not in acute distress.    Appearance: He is well-developed and overweight. He is not ill-appearing.  HENT:     Head: Normocephalic and atraumatic.     Right Ear: Hearing normal. No drainage.      Left Ear: Hearing normal. No drainage.  Eyes:     General: Lids are normal.        Right eye: No discharge.        Left eye: No discharge.     Conjunctiva/sclera: Conjunctivae normal.     Pupils: Pupils are equal, round, and reactive to light.  Neck:     Vascular: No carotid bruit.  Cardiovascular:     Rate and Rhythm: Normal rate and regular rhythm.     Heart sounds: Normal heart sounds, S1 normal and S2 normal. No murmur heard.   No gallop.  Pulmonary:     Effort: Pulmonary effort is normal. No accessory muscle usage or respiratory distress.     Breath sounds: Normal breath sounds.  Abdominal:     General: Bowel sounds are normal.     Palpations: Abdomen is soft.     Tenderness: There is no abdominal tenderness.  Musculoskeletal:     Cervical back: Normal range of motion and neck supple.     Right lower leg: No edema.     Left lower leg: No edema.  Skin:    General: Skin is warm and dry.  Neurological:     Mental Status: He is alert and oriented to person, place, and time.  Psychiatric:        Attention and Perception: Attention normal.        Mood and Affect: Mood normal.        Speech: Speech normal.        Behavior: Behavior normal. Behavior is cooperative.      Results for orders placed or performed in visit on 06/16/21  Bayer DCA Hb A1c Waived  Result Value Ref Range   HB A1C (BAYER DCA - WAIVED) 7.2 (H) <7.0 %      Assessment & Plan:   Problem List Items Addressed This Visit       Cardiovascular and Mediastinum   Hypertension associated with diabetes (HCC)    Chronic, ongoing with BP at goal today and at goal on home readings.  Continue current medication regimen and adjust as needed.  Continue collaboration with cardiology.  Recommend he monitor BP at least a few times a week at home and document.  DASH diet focus.  Labs: CMP, CBC, TSH today.  Return in 3 months.      Relevant Medications   atorvastatin (LIPITOR) 20 MG tablet   chlorthalidone (HYGROTON) 25  MG tablet   dapagliflozin propanediol (FARXIGA) 10 MG TABS tablet   furosemide (LASIX) 20 MG tablet   metFORMIN (GLUCOPHAGE) 1000 MG tablet   metoprolol tartrate (LOPRESSOR) 25 MG tablet   Other Relevant Orders  Bayer DCA Hb A1c Waived   Comprehensive metabolic panel   TSH   CBC with Differential/Platelet   Coronary artery disease    Continue collaboration with cardiology.      Relevant Medications   atorvastatin (LIPITOR) 20 MG tablet   chlorthalidone (HYGROTON) 25 MG tablet   furosemide (LASIX) 20 MG tablet   metoprolol tartrate (LOPRESSOR) 25 MG tablet   Aortic atherosclerosis (HCC)    Noted on CT imaging.  Continue ASA and Lipitor daily for prevention.  Quit smoking 2 years ago.        Relevant Medications   atorvastatin (LIPITOR) 20 MG tablet   chlorthalidone (HYGROTON) 25 MG tablet   furosemide (LASIX) 20 MG tablet   metoprolol tartrate (LOPRESSOR) 25 MG tablet   Chronic heart failure with preserved ejection fraction (HFpEF) (HCC)    Chronic, stable.  Euvolemic.  Continue collaboration with cardiology and current medication regimen as ordered by them.  On Farxiga for prevention.  Recommend: - Reminded to call for an overnight weight gain of >2 pounds or a weekly weight weight of >5 pounds - not adding salt to his food and has been reading food labels. Reviewed the importance of keeping daily sodium intake to 2000mg  daily        Relevant Medications   atorvastatin (LIPITOR) 20 MG tablet   chlorthalidone (HYGROTON) 25 MG tablet   furosemide (LASIX) 20 MG tablet   metoprolol tartrate (LOPRESSOR) 25 MG tablet     Respiratory   Centrilobular emphysema (HCC)    Chronic, stable with minimal use of Albuterol.  PFT last visit FEV1 64% and FEV1/FVC 73%, remaining stable from previous.  Continue to monitor and adjust regimen as needed.  Continue Albuterol and recommend he use this if wheezing present or SOB.  Continue cessation of smoking and continue yearly lung screening.         Digestive   Hepatic steatosis    Noted on lung screening July 2021 -- recommend focus on healthy diet and will continue to monitor.        Endocrine   Controlled type 2 diabetes with neuropathy (HCC) - Primary    Chronic, ongoing.  A1C 7.3%, increase from previous due to dietary indiscretions. Urine ALB 10 and A:C <29 March 2021. Continue current medication regimen and checking BS at home daily.  Continue Metformin and Marcelline Deist as ordered, may need assist with Marcelline Deist in future.  Will not change medications today, recommend heavy focus on diet with less pasta and potatoes.  Continue Gabapentin as prescribed by podiatry and adjust as needed.  Return in 3 months for follow-up.         Relevant Medications   atorvastatin (LIPITOR) 20 MG tablet   dapagliflozin propanediol (FARXIGA) 10 MG TABS tablet   metFORMIN (GLUCOPHAGE) 1000 MG tablet   Other Relevant Orders   Bayer DCA Hb A1c Waived   Hyperlipidemia associated with type 2 diabetes mellitus (HCC)    Chronic, ongoing.  Continue current medication regimen and adjust as needed.  Lipid panel today.  Recent LDL <70.      Relevant Medications   atorvastatin (LIPITOR) 20 MG tablet   dapagliflozin propanediol (FARXIGA) 10 MG TABS tablet   metFORMIN (GLUCOPHAGE) 1000 MG tablet   Other Relevant Orders   Bayer DCA Hb A1c Waived   Comprehensive metabolic panel   Lipid Panel w/o Chol/HDL Ratio     Other   Personal history of tobacco use, presenting hazards to health    Continue annual  lung screening.  Recommend continued cessation.      Adrenal nodule (HCC)    Left side, noted on CT lung screening, stable on recent check = possible adenoma reported.  Continue to monitor on screenings and send to nephrology as needed.      Other Visit Diagnoses     Flu vaccine need       Flu vaccine today.   Relevant Orders   Flu Vaccine QUAD High Dose(Fluad)        Follow up plan: Return in about 3 months (around 12/18/2021) for T2DM,  HTN/HLD, BPH, COPD.

## 2021-09-18 NOTE — Assessment & Plan Note (Signed)
Chronic, stable with minimal use of Albuterol.  PFT last visit FEV1 64% and FEV1/FVC 73%, remaining stable from previous.  Continue to monitor and adjust regimen as needed.  Continue Albuterol and recommend he use this if wheezing present or SOB.  Continue cessation of smoking and continue yearly lung screening.

## 2021-09-18 NOTE — Assessment & Plan Note (Signed)
Chronic, ongoing.  Continue current medication regimen and adjust as needed.  Lipid panel today.  Recent LDL <70. 

## 2021-09-18 NOTE — Patient Instructions (Signed)
Diabetes Mellitus and Nutrition, Adult When you have diabetes, or diabetes mellitus, it is very important to have healthy eating habits because your blood sugar (glucose) levels are greatly affected by what you eat and drink. Eating healthy foods in the right amounts, at about the same times every day, can help you:  Control your blood glucose.  Lower your risk of heart disease.  Improve your blood pressure.  Reach or maintain a healthy weight. What can affect my meal plan? Every person with diabetes is different, and each person has different needs for a meal plan. Your health care provider may recommend that you work with a dietitian to make a meal plan that is best for you. Your meal plan may vary depending on factors such as:  The calories you need.  The medicines you take.  Your weight.  Your blood glucose, blood pressure, and cholesterol levels.  Your activity level.  Other health conditions you have, such as heart or kidney disease. How do carbohydrates affect me? Carbohydrates, also called carbs, affect your blood glucose level more than any other type of food. Eating carbs naturally raises the amount of glucose in your blood. Carb counting is a method for keeping track of how many carbs you eat. Counting carbs is important to keep your blood glucose at a healthy level, especially if you use insulin or take certain oral diabetes medicines. It is important to know how many carbs you can safely have in each meal. This is different for every person. Your dietitian can help you calculate how many carbs you should have at each meal and for each snack. How does alcohol affect me? Alcohol can cause a sudden decrease in blood glucose (hypoglycemia), especially if you use insulin or take certain oral diabetes medicines. Hypoglycemia can be a life-threatening condition. Symptoms of hypoglycemia, such as sleepiness, dizziness, and confusion, are similar to symptoms of having too much  alcohol.  Do not drink alcohol if: ? Your health care provider tells you not to drink. ? You are pregnant, may be pregnant, or are planning to become pregnant.  If you drink alcohol: ? Do not drink on an empty stomach. ? Limit how much you use to:  0-1 drink a day for women.  0-2 drinks a day for men. ? Be aware of how much alcohol is in your drink. In the [place] one drink equals one 12 oz bottle of beer (355 mL), one 5 oz glass of wine (148 mL), or one 1 oz glass of hard liquor (44 mL). ? Keep yourself hydrated with water, diet soda, or unsweetened iced tea.  Keep in mind that regular soda, juice, and other mixers may contain a lot of sugar and must be counted as carbs. What are tips for following this plan? Reading food labels  Start by checking the serving size on the "Nutrition Facts" label of packaged foods and drinks. The amount of calories, carbs, fats, and other nutrients listed on the label is based on one serving of the item. Many items contain more than one serving per package.  Check the total grams (g) of carbs in one serving. You can calculate the number of servings of carbs in one serving by dividing the total carbs by 15. For example, if a food has 30 g of total carbs per serving, it would be equal to 2 servings of carbs.  Check the number of grams (g) of saturated fats and trans fats in one serving. Choose foods that have   a low amount or none of these fats.  Check the number of milligrams (mg) of salt (sodium) in one serving. Most people should limit total sodium intake to less than 2,300 mg per day.  Always check the nutrition information of foods labeled as "low-fat" or "nonfat." These foods may be higher in added sugar or refined carbs and should be avoided.  Talk to your dietitian to identify your daily goals for nutrients listed on the label. Shopping  Avoid buying canned, pre-made, or processed foods. These foods tend to be high in fat, sodium, and added  sugar.  Shop around the outside edge of the grocery store. This is where you will most often find fresh fruits and vegetables, bulk grains, fresh meats, and fresh dairy. Cooking  Use low-heat cooking methods, such as baking, instead of high-heat cooking methods like deep frying.  Cook using healthy oils, such as olive, canola, or sunflower oil.  Avoid cooking with butter, cream, or high-fat meats. Meal planning  Eat meals and snacks regularly, preferably at the same times every day. Avoid going long periods of time without eating.  Eat foods that are high in fiber, such as fresh fruits, vegetables, beans, and whole grains. Talk with your dietitian about how many servings of carbs you can eat at each meal.  Eat 4-6 oz (112-168 g) of lean protein each day, such as lean meat, chicken, fish, eggs, or tofu. One ounce (oz) of lean protein is equal to: ? 1 oz (28 g) of meat, chicken, or fish. ? 1 egg. ?  cup (62 g) of tofu.  Eat some foods each day that contain healthy fats, such as avocado, nuts, seeds, and fish.   What foods should I eat? Fruits Berries. Apples. Oranges. Peaches. Apricots. Plums. Grapes. Mango. Papaya. Pomegranate. Kiwi. Cherries. Vegetables Lettuce. Spinach. Leafy greens, including kale, chard, collard greens, and mustard greens. Beets. Cauliflower. Cabbage. Broccoli. Carrots. Green beans. Tomatoes. Peppers. Onions. Cucumbers. Brussels sprouts. Grains Whole grains, such as whole-wheat or whole-grain bread, crackers, tortillas, cereal, and pasta. Unsweetened oatmeal. Quinoa. Brown or wild rice. Meats and other proteins Seafood. Poultry without skin. Lean cuts of poultry and beef. Tofu. Nuts. Seeds. Dairy Low-fat or fat-free dairy products such as milk, yogurt, and cheese. The items listed above may not be a complete list of foods and beverages you can eat. Contact a dietitian for more information. What foods should I avoid? Fruits Fruits canned with  syrup. Vegetables Canned vegetables. Frozen vegetables with butter or cream sauce. Grains Refined white flour and flour products such as bread, pasta, snack foods, and cereals. Avoid all processed foods. Meats and other proteins Fatty cuts of meat. Poultry with skin. Breaded or fried meats. Processed meat. Avoid saturated fats. Dairy Full-fat yogurt, cheese, or milk. Beverages Sweetened drinks, such as soda or iced tea. The items listed above may not be a complete list of foods and beverages you should avoid. Contact a dietitian for more information. Questions to ask a health care provider  Do I need to meet with a diabetes educator?  Do I need to meet with a dietitian?  What number can I call if I have questions?  When are the best times to check my blood glucose? Where to find more information:  American Diabetes Association: diabetes.org  Academy of Nutrition and Dietetics: www.eatright.org  National Institute of Diabetes and Digestive and Kidney Diseases: www.niddk.nih.gov  Association of Diabetes Care and Education Specialists: www.diabeteseducator.org Summary  It is important to have healthy eating   habits because your blood sugar (glucose) levels are greatly affected by what you eat and drink.  A healthy meal plan will help you control your blood glucose and maintain a healthy lifestyle.  Your health care provider may recommend that you work with a dietitian to make a meal plan that is best for you.  Keep in mind that carbohydrates (carbs) and alcohol have immediate effects on your blood glucose levels. It is important to count carbs and to use alcohol carefully. This information is not intended to replace advice given to you by your health care provider. Make sure you discuss any questions you have with your health care provider. Document Revised: 11/24/2019 Document Reviewed: 11/24/2019 Elsevier Patient Education  2021 Elsevier Inc.  

## 2021-09-18 NOTE — Assessment & Plan Note (Signed)
Noted on CT imaging.  Continue ASA and Lipitor daily for prevention.  Quit smoking 2 years ago.   °

## 2021-09-18 NOTE — Assessment & Plan Note (Signed)
Chronic, ongoing with BP at goal today and at goal on home readings.  Continue current medication regimen and adjust as needed.  Continue collaboration with cardiology.  Recommend he monitor BP at least a few times a week at home and document.  DASH diet focus.  Labs: CMP, CBC, TSH today.  Return in 3 months.

## 2021-09-18 NOTE — Assessment & Plan Note (Signed)
Noted on lung screening July 2021 -- recommend focus on healthy diet and will continue to monitor.

## 2021-09-18 NOTE — Assessment & Plan Note (Signed)
Chronic, stable.  Euvolemic.  Continue collaboration with cardiology and current medication regimen as ordered by them.  On Farxiga for prevention.  Recommend: - Reminded to call for an overnight weight gain of >2 pounds or a weekly weight weight of >5 pounds - not adding salt to his food and has been reading food labels. Reviewed the importance of keeping daily sodium intake to 2000mg  daily

## 2021-09-19 LAB — CBC WITH DIFFERENTIAL/PLATELET
Basophils Absolute: 0.1 x10E3/uL (ref 0.0–0.2)
Basos: 1 %
EOS (ABSOLUTE): 0.1 x10E3/uL (ref 0.0–0.4)
Eos: 2 %
Hematocrit: 47.8 % (ref 37.5–51.0)
Hemoglobin: 16.5 g/dL (ref 13.0–17.7)
Immature Grans (Abs): 0 x10E3/uL (ref 0.0–0.1)
Immature Granulocytes: 0 %
Lymphocytes Absolute: 2.3 x10E3/uL (ref 0.7–3.1)
Lymphs: 31 %
MCH: 30.1 pg (ref 26.6–33.0)
MCHC: 34.5 g/dL (ref 31.5–35.7)
MCV: 87 fL (ref 79–97)
Monocytes Absolute: 0.6 x10E3/uL (ref 0.1–0.9)
Monocytes: 9 %
Neutrophils Absolute: 4.3 x10E3/uL (ref 1.4–7.0)
Neutrophils: 57 %
Platelets: 213 x10E3/uL (ref 150–450)
RBC: 5.48 x10E6/uL (ref 4.14–5.80)
RDW: 13.2 % (ref 11.6–15.4)
WBC: 7.4 x10E3/uL (ref 3.4–10.8)

## 2021-09-19 LAB — COMPREHENSIVE METABOLIC PANEL WITH GFR
ALT: 25 IU/L (ref 0–44)
AST: 17 IU/L (ref 0–40)
Albumin/Globulin Ratio: 3.5 — ABNORMAL HIGH (ref 1.2–2.2)
Albumin: 5.2 g/dL — ABNORMAL HIGH (ref 3.8–4.8)
Alkaline Phosphatase: 51 IU/L (ref 44–121)
BUN/Creatinine Ratio: 17 (ref 10–24)
BUN: 21 mg/dL (ref 8–27)
Bilirubin Total: 0.3 mg/dL (ref 0.0–1.2)
CO2: 25 mmol/L (ref 20–29)
Calcium: 10 mg/dL (ref 8.6–10.2)
Chloride: 101 mmol/L (ref 96–106)
Creatinine, Ser: 1.26 mg/dL (ref 0.76–1.27)
Globulin, Total: 1.5 g/dL (ref 1.5–4.5)
Glucose: 104 mg/dL — ABNORMAL HIGH (ref 65–99)
Potassium: 3.5 mmol/L (ref 3.5–5.2)
Sodium: 142 mmol/L (ref 134–144)
Total Protein: 6.7 g/dL (ref 6.0–8.5)
eGFR: 61 mL/min/1.73

## 2021-09-19 LAB — LIPID PANEL W/O CHOL/HDL RATIO
Cholesterol, Total: 119 mg/dL (ref 100–199)
HDL: 29 mg/dL — ABNORMAL LOW
LDL Chol Calc (NIH): 59 mg/dL (ref 0–99)
Triglycerides: 182 mg/dL — ABNORMAL HIGH (ref 0–149)
VLDL Cholesterol Cal: 31 mg/dL (ref 5–40)

## 2021-09-19 LAB — TSH: TSH: 1.55 u[IU]/mL (ref 0.450–4.500)

## 2021-09-19 NOTE — Progress Notes (Signed)
Good morning, please let patient know his labs have returned and overall remain stable.  No medication changes needed.  We will continue to monitor and if needed plan on changes in future.  Have a great day!! Keep being awesome!!  Thank you for allowing me to participate in your care.  I appreciate you. Kindest regards, Kashara Blocher

## 2021-09-20 ENCOUNTER — Telehealth: Payer: Self-pay

## 2021-09-20 NOTE — Telephone Encounter (Signed)
Received fax from [facility] requesting an order be signed and faxed back for diabetic shoes. Patient must see a doctor to have this form signed. Please call and schedule the patient an appointment with Dr. Laural Benes to have this completed.   Form placed in the incomplete bin until appointment.

## 2021-09-21 NOTE — Telephone Encounter (Signed)
Called pt to schedule an appt. His vm was not set up, could not leave vm.

## 2021-09-25 NOTE — Telephone Encounter (Signed)
error 

## 2021-10-03 ENCOUNTER — Telehealth: Payer: Self-pay

## 2021-10-03 ENCOUNTER — Telehealth: Payer: Self-pay | Admitting: Podiatry

## 2021-10-03 NOTE — Telephone Encounter (Signed)
Pt called checking on diabetic shoe status.  Upon checking we have not gotten the documents from pts pcp. I advised pt to call the pcp to see why they have not signed the documents

## 2021-10-03 NOTE — Telephone Encounter (Signed)
Appointment scheduled.   Copied from CRM [id number]. Topic: General - Other >> Oct 03, 2021  3:04 PM Pawlus, Maxine Glenn A wrote: Reason for CRM: Pt stated he needs an order so he can get his diabetic shoes, please advise.

## 2021-10-04 NOTE — Telephone Encounter (Signed)
Pt scheduled 10/12/2021.

## 2021-10-12 ENCOUNTER — Other Ambulatory Visit: Payer: Self-pay

## 2021-10-12 ENCOUNTER — Encounter: Payer: Self-pay | Admitting: Family Medicine

## 2021-10-12 ENCOUNTER — Ambulatory Visit (INDEPENDENT_AMBULATORY_CARE_PROVIDER_SITE_OTHER): Payer: Medicare HMO | Admitting: Family Medicine

## 2021-10-12 VITALS — BP 125/84 | HR 67 | Ht 75.0 in | Wt 202.0 lb

## 2021-10-12 DIAGNOSIS — E1140 Type 2 diabetes mellitus with diabetic neuropathy, unspecified: Secondary | ICD-10-CM | POA: Diagnosis not present

## 2021-10-12 NOTE — Assessment & Plan Note (Signed)
Doing well. Needs diabetic shoes. Form filled out. Continue to monitor. Call with any concerns.

## 2021-10-12 NOTE — Progress Notes (Signed)
BP 125/84   Pulse 67   Ht 6\' 3"  (1.905 m)   Wt 202 lb (91.6 kg)   BMI 25.25 kg/m    Subjective:    Patient ID: Brendan [name], male    DOB: [birth year], 70 y.o.   MRN: [id number]  HPI: Brendan [name] is a 70 y.o. male  Chief Complaint  Patient presents with   Diabetes    Shoe request   DIABETES Hypoglycemic episodes:no Polydipsia/polyuria: no Visual disturbance: no Chest pain: no Paresthesias: no Glucose Monitoring: yes Taking Insulin?: no Blood Pressure Monitoring: not checking Retinal Examination: Up to Date Foot Exam: Up to Date Diabetic Education: Completed Pneumovax: Up to Date Influenza: Up to Date Aspirin: yes  Needs diabetic shoes. Has been seeing podiatry. Feeling well. No concerns. Follows regularly with his PCP. Continue to monitor.   Relevant past medical, surgical, family and social history reviewed and updated as indicated. Interim medical history since our last visit reviewed. Allergies and medications reviewed and updated.  Review of Systems  Constitutional: Negative.   Respiratory: Negative.    Cardiovascular: Negative.   Gastrointestinal: Negative.   Musculoskeletal: Negative.   Psychiatric/Behavioral: Negative.     Per HPI unless specifically indicated above     Objective:    BP 125/84   Pulse 67   Ht 6\' 3"  (1.905 m)   Wt 202 lb (91.6 kg)   BMI 25.25 kg/m   Wt Readings from Last 3 Encounters:  10/12/21 202 lb (91.6 kg)  09/18/21 202 lb 9.6 oz (91.9 kg)  07/20/21 200 lb (90.7 kg)    Physical Exam Vitals and nursing note reviewed.  Constitutional:      General: He is not in acute distress.    Appearance: Normal appearance. He is not ill-appearing, toxic-appearing or diaphoretic.  HENT:     Head: Normocephalic and atraumatic.     Right Ear: External ear normal.     Left Ear: External ear normal.     Nose: Nose normal.     Mouth/Throat:     Mouth: Mucous membranes are moist.     Pharynx: Oropharynx is clear.  Eyes:     General:  No scleral icterus.       Right eye: No discharge.        Left eye: No discharge.     Extraocular Movements: Extraocular movements intact.     Conjunctiva/sclera: Conjunctivae normal.     Pupils: Pupils are equal, round, and reactive to light.  Cardiovascular:     Rate and Rhythm: Normal rate and regular rhythm.     Pulses: Normal pulses.     Heart sounds: Normal heart sounds. No murmur heard.   No friction rub. No gallop.  Pulmonary:     Effort: Pulmonary effort is normal. No respiratory distress.     Breath sounds: Normal breath sounds. No stridor. No wheezing, rhonchi or rales.  Chest:     Chest wall: No tenderness.  Musculoskeletal:        General: Normal range of motion.     Cervical back: Normal range of motion and neck supple.  Skin:    General: Skin is warm and dry.     Capillary Refill: Capillary refill takes less than 2 seconds.     Coloration: Skin is not jaundiced or pale.     Findings: No bruising, erythema, lesion or rash.  Neurological:     General: No focal deficit present.     Mental Status: He is alert and oriented  to person, place, and time. Mental status is at baseline.  Psychiatric:        Mood and Affect: Mood normal.        Behavior: Behavior normal.        Thought Content: Thought content normal.        Judgment: Judgment normal.    Results for orders placed or performed in visit on 09/20/21  HM DIABETES EYE EXAM  Result Value Ref Range   HM Diabetic Eye Exam No Retinopathy No Retinopathy      Assessment & Plan:   Problem List Items Addressed This Visit       Endocrine   Controlled type 2 diabetes with neuropathy (HCC) - Primary    Doing well. Needs diabetic shoes. Form filled out. Continue to monitor. Call with any concerns.         Follow up plan: Return if symptoms worsen or fail to improve.

## 2021-10-24 DIAGNOSIS — M5416 Radiculopathy, lumbar region: Secondary | ICD-10-CM | POA: Diagnosis not present

## 2021-10-24 DIAGNOSIS — M545 Low back pain, unspecified: Secondary | ICD-10-CM | POA: Diagnosis not present

## 2021-11-07 ENCOUNTER — Telehealth: Payer: Self-pay | Admitting: Podiatry

## 2021-11-07 NOTE — Telephone Encounter (Signed)
Pt left message checking to see if diabetic shoes are in.[name] He is a [place] pt

## 2021-12-04 ENCOUNTER — Other Ambulatory Visit: Payer: Self-pay | Admitting: Nurse Practitioner

## 2021-12-04 NOTE — Telephone Encounter (Signed)
Rx refilled 09/18/2021 #90 with 4 refills. Pt should have ample supply.

## 2021-12-05 NOTE — Telephone Encounter (Signed)
Requested Prescriptions  Pending Prescriptions Disp Refills  . metoprolol tartrate (LOPRESSOR) 25 MG tablet [Pharmacy Med Name: METOPROLOL TARTRATE 25 MG TAB] 90 tablet 0    Sig: Take 0.5 tablets (12.5 mg total) by mouth 2 (two) times daily.     Cardiovascular:  Beta Blockers Passed - 12/04/2021  3:18 PM      Passed - Last BP in normal range    BP Readings from Last 1 Encounters:  10/12/21 125/84         Passed - Last Heart Rate in normal range    Pulse Readings from Last 1 Encounters:  10/12/21 67         Passed - Valid encounter within last 6 months    Recent Outpatient Visits          1 month ago Controlled type 2 diabetes with neuropathy (HCC)   [facility] [place], Megan P, DO   2 months ago Controlled type 2 diabetes with neuropathy (HCC)   [facility] [name], [name], NP   5 months ago Controlled type 2 diabetes with neuropathy (HCC)   [facility] [place], Alexandria T, NP   6 months ago Chronic right shoulder pain   [facility] [place], Clydie Braun, NP   8 months ago Controlled type 2 diabetes with neuropathy (HCC)   [facility] [name], Dorie Rank, NP      Future Appointments            In 1 week [name], Dorie Rank, NP [facility]   In 4 months  [facility]

## 2021-12-06 ENCOUNTER — Other Ambulatory Visit: Payer: Self-pay

## 2021-12-06 ENCOUNTER — Ambulatory Visit (INDEPENDENT_AMBULATORY_CARE_PROVIDER_SITE_OTHER): Payer: Medicare HMO | Admitting: Podiatry

## 2021-12-06 ENCOUNTER — Encounter: Payer: Self-pay | Admitting: Podiatry

## 2021-12-06 DIAGNOSIS — M2042 Other hammer toe(s) (acquired), left foot: Secondary | ICD-10-CM

## 2021-12-06 DIAGNOSIS — E1142 Type 2 diabetes mellitus with diabetic polyneuropathy: Secondary | ICD-10-CM

## 2021-12-06 DIAGNOSIS — I739 Peripheral vascular disease, unspecified: Secondary | ICD-10-CM

## 2021-12-06 DIAGNOSIS — M2041 Other hammer toe(s) (acquired), right foot: Secondary | ICD-10-CM

## 2021-12-06 NOTE — Progress Notes (Signed)
Patient presents today to pick up diabetic shoes and insoles.  Patient was dispensed 1 pair of diabetic shoes and 3 pairs of foam casted diabetic insoles. She tried on the shoes with the insoles and the fit was not satisfactory.   She states the shoes felt too wide    I recommended we reorder a medium width instead of wide.   We will send back: Apex V854 Men's 14 Wide  Reorder: Apex V854 Men's 14 Medium  Insoles were kept in office to check fit with reordered shoes.  Patient will be contacted for a fitting appointment once the reordered shoes arrive in office.

## 2021-12-12 ENCOUNTER — Telehealth: Payer: Self-pay | Admitting: Podiatry

## 2021-12-12 NOTE — Telephone Encounter (Signed)
Reordered diabetic shoes in [place] to be taken to [place].. called pt and no voicemail set up.. pt needs an appt to pick them up.

## 2021-12-15 ENCOUNTER — Ambulatory Visit: Payer: Medicare HMO

## 2021-12-15 ENCOUNTER — Other Ambulatory Visit: Payer: Self-pay

## 2021-12-15 DIAGNOSIS — E1142 Type 2 diabetes mellitus with diabetic polyneuropathy: Secondary | ICD-10-CM

## 2021-12-15 DIAGNOSIS — M2042 Other hammer toe(s) (acquired), left foot: Secondary | ICD-10-CM

## 2021-12-15 DIAGNOSIS — M2041 Other hammer toe(s) (acquired), right foot: Secondary | ICD-10-CM

## 2021-12-15 NOTE — Progress Notes (Signed)
SITUATION Reason for Visit: Fitting of Diabetic Shoes & Insoles Patient / Caregiver Report:  Patient reports comfort  OBJECTIVE DATA: Patient History / Diagnosis:     ICD-10-CM   1. Diabetic polyneuropathy associated with type 2 diabetes mellitus (HCC)  E11.42       Change in Status:   None  ACTIONS PERFORMED: In-Person Delivery, patient was fit with: - 1x pair [id number] PDAC approved prefabricated Diabetic Shoes: Apex lace up sneakers 76M - 3x pair [id number] PDAC approved CAM milled custom diabetic insoles  Shoes and insoles were verified for structural integrity and safety. Patient wore shoes and insoles in office. Skin was inspected and free of areas of concern after wearing shoes and inserts. Shoes and inserts fit properly. Patient / Caregiver provided with ferbal instruction and demonstration regarding donning, doffing, wear, care, proper fit, function, purpose, cleaning, and use of shoes and insoles ' and in all related precautions and risks and benefits regarding shoes and insoles. Patient / Caregiver was instructed to wear properly fitting socks with shoes at all times. Patient was also provided with verbal instruction regarding how to report any failures or malfunctions of shoes or inserts, and necessary follow up care. Patient / Caregiver was also instructed to contact physician regarding change in status that may affect function of shoes and inserts.   Patient / Caregiver verbalized undersatnding of instruction provided. Patient / Caregiver demonstrated independence with proper donning and doffing of shoes and inserts.  PLAN Patient to follow up as needed. Plan of care was discussed with and agreed upon by patient and/or caregiver. All questions were answered and concerns addressed.

## 2021-12-18 ENCOUNTER — Other Ambulatory Visit: Payer: Self-pay

## 2021-12-18 ENCOUNTER — Encounter: Payer: Self-pay | Admitting: Nurse Practitioner

## 2021-12-18 ENCOUNTER — Ambulatory Visit (INDEPENDENT_AMBULATORY_CARE_PROVIDER_SITE_OTHER): Payer: Medicare HMO | Admitting: Nurse Practitioner

## 2021-12-18 VITALS — BP 125/75 | HR 60 | Temp 98.1°F | Ht 75.0 in | Wt 197.2 lb

## 2021-12-18 DIAGNOSIS — I700 Atherosclerosis of aorta: Secondary | ICD-10-CM | POA: Diagnosis not present

## 2021-12-18 DIAGNOSIS — N400 Enlarged prostate without lower urinary tract symptoms: Secondary | ICD-10-CM

## 2021-12-18 DIAGNOSIS — E1140 Type 2 diabetes mellitus with diabetic neuropathy, unspecified: Secondary | ICD-10-CM | POA: Diagnosis not present

## 2021-12-18 DIAGNOSIS — I739 Peripheral vascular disease, unspecified: Secondary | ICD-10-CM

## 2021-12-18 DIAGNOSIS — J432 Centrilobular emphysema: Secondary | ICD-10-CM | POA: Diagnosis not present

## 2021-12-18 DIAGNOSIS — E785 Hyperlipidemia, unspecified: Secondary | ICD-10-CM

## 2021-12-18 DIAGNOSIS — I152 Hypertension secondary to endocrine disorders: Secondary | ICD-10-CM | POA: Diagnosis not present

## 2021-12-18 DIAGNOSIS — E1159 Type 2 diabetes mellitus with other circulatory complications: Secondary | ICD-10-CM

## 2021-12-18 DIAGNOSIS — I5032 Chronic diastolic (congestive) heart failure: Secondary | ICD-10-CM | POA: Diagnosis not present

## 2021-12-18 DIAGNOSIS — K5904 Chronic idiopathic constipation: Secondary | ICD-10-CM

## 2021-12-18 DIAGNOSIS — E278 Other specified disorders of adrenal gland: Secondary | ICD-10-CM

## 2021-12-18 DIAGNOSIS — E1169 Type 2 diabetes mellitus with other specified complication: Secondary | ICD-10-CM

## 2021-12-18 DIAGNOSIS — Z23 Encounter for immunization: Secondary | ICD-10-CM

## 2021-12-18 LAB — BAYER DCA HB A1C WAIVED: HB A1C (BAYER DCA - WAIVED): 6.8 % — ABNORMAL HIGH (ref 4.8–5.6)

## 2021-12-18 MED ORDER — SHINGRIX 50 MCG/0.5ML IM SUSR: 0.5000 mL | Freq: Once | INTRAMUSCULAR | 0 refills | Status: AC

## 2021-12-18 NOTE — Assessment & Plan Note (Signed)
Chronic, stable.  Euvolemic.  Continue collaboration with cardiology and current medication regimen as ordered by them.  On Farxiga for prevention.  Recommend: - Reminded to call for an overnight weight gain of >2 pounds or a weekly weight weight of >5 pounds - not adding salt to his food and has been reading food labels. Reviewed the importance of keeping daily sodium intake to 2000mg  daily  - Avoid Ibuprofen medications

## 2021-12-18 NOTE — Assessment & Plan Note (Signed)
Chronic, stable with minimal use of Albuterol.  PFT this year FEV1 64% and FEV1/FVC 73%, remaining stable from previous.  Continue to monitor and adjust regimen as needed.  Continue Albuterol and recommend he use this if wheezing present or SOB.  Continue cessation of smoking and continue yearly lung screening.

## 2021-12-18 NOTE — Assessment & Plan Note (Signed)
Chronic, stable.  Continue current medication regimen, Flomax, and adjust as needed.

## 2021-12-18 NOTE — Assessment & Plan Note (Signed)
Ongoing -- IBS at baseline.  Recommend starting Metamucil or Benefiber daily.  If ongoing symptoms or issues return to office.

## 2021-12-18 NOTE — Assessment & Plan Note (Signed)
Chronic, ongoing with BP at goal today and at goal on home readings.  Continue current medication regimen and adjust as needed.  Continue collaboration with cardiology.  Recommend he monitor BP at least a few times a week at home and document.  DASH diet focus.  Labs up to date.  Return in 3 months.

## 2021-12-18 NOTE — Assessment & Plan Note (Signed)
Noted on CT imaging.  Continue ASA and Lipitor daily for prevention.  Quit smoking 2 years ago.

## 2021-12-18 NOTE — Assessment & Plan Note (Signed)
Left side, noted on CT lung screening, stable on recent check = possible adenoma reported.  Continue to monitor on screenings and send to nephrology as needed.

## 2021-12-18 NOTE — Assessment & Plan Note (Signed)
Chronic, ongoing.  Continue current medication regimen and adjust as needed.  Lipid panel up to date.  Recent LDL <70. 

## 2021-12-18 NOTE — Patient Instructions (Addendum)
Start taking either Metamucil or Benefiber daily for bowel pattern.  Diet for Irritable Bowel Syndrome When you have irritable bowel syndrome (IBS), it is very important to eat the foods and follow the eating habits that are best for your condition. IBS may cause various symptoms such as pain in the abdomen, constipation, or diarrhea. Choosing the right foods can help to ease the discomfort from these symptoms. Work with your health care provider and diet and nutrition specialist (dietitian) to find the eating plan that will help to control your symptoms. What are tips for following this plan?   Keep a food diary. This will help you identify foods that cause symptoms. Write down: What you eat and when you eat it. What symptoms you have. When symptoms occur in relation to your meals, such as "pain in abdomen 2 hours after dinner." Eat your meals slowly and in a relaxed setting. Aim to eat 5-6 small meals per day. Do not skip meals. Drink enough fluid to keep your urine pale yellow. Ask your health care provider if you should take an over-the-counter probiotic to help restore healthy bacteria in your gut (digestive tract). Probiotics are foods that contain good bacteria and yeasts. Your dietitian may have specific dietary recommendations for you based on your symptoms. He or she may recommend that you: Avoid foods that cause symptoms. Talk with your dietitian about other ways to get the same nutrients that are in those problem foods. Avoid foods with gluten. Gluten is a protein that is found in rye, wheat, and barley. Eat more foods that contain soluble fiber. Examples of foods with high soluble fiber include oats, seeds, and certain fruits and vegetables. Take a fiber supplement if directed by your dietitian. Reduce or avoid certain foods called FODMAPs. These are foods that contain carbohydrates that are hard to digest. Ask your doctor which foods contain these carbohydrates. What foods are not  recommended? The following are some foods and drinks that may make your symptoms worse: Fatty foods, such as french fries. Foods that contain gluten, such as pasta and cereal. Dairy products, such as milk, cheese, and ice cream. Chocolate. Alcohol. Products with caffeine, such as coffee. Carbonated drinks, such as soda. Foods that are high in FODMAPs. These include certain fruits and vegetables. Products with sweeteners such as honey, high fructose corn syrup, sorbitol, and mannitol. The items listed above may not be a complete list of foods and beverages you should avoid. Contact a dietitian for more information. What foods are good sources of fiber? Your health care provider or dietitian may recommend that you eat more foods that contain fiber. Fiber can help to reduce constipation and other IBS symptoms. Add foods with fiber to your diet a little at a time so your body can get used to them. Too much fiber at one time might cause gas and swelling of your abdomen. The following are some foods that are good sources of fiber: Berries, such as raspberries, strawberries, and blueberries. Tomatoes. Carrots. Brown rice. Oats. Seeds, such as chia and pumpkin seeds. The items listed above may not be a complete list of recommended sources of fiber. Contact your dietitian for more options. Where to find more information International Foundation for Functional Gastrointestinal Disorders: www.iffgd.[employer] of Diabetes and Digestive and Kidney Diseases: [employer]tn Summary When you have irritable bowel syndrome (IBS), it is very important to eat the foods and follow the eating habits that are best for your condition. IBS may cause various symptoms such  as pain in the abdomen, constipation, or diarrhea. Choosing the right foods can help to ease the discomfort that comes from symptoms. Keep a food diary. This will help you identify foods that cause symptoms. Your health care  provider or diet and nutrition specialist (dietitian) may recommend that you eat more foods that contain fiber. This information is not intended to replace advice given to you by your health care provider. Make sure you discuss any questions you have with your health care provider. Document Revised: 08/11/2020 Document Reviewed: 08/18/2020 Elsevier Patient Education  2022 [employer].

## 2021-12-18 NOTE — Assessment & Plan Note (Signed)
Chronic, ongoing.  A1C 6.8%, decrease from previous 7.3%. Urine ALB 10 and A:C <29 March 2021. Continue current medication regimen and checking BS at home daily.  Continue Metformin and Marcelline Deist as ordered, may need assist with Marcelline Deist in future.  Will not change medications today, recommend heavy focus on diet with less pasta and potatoes.  Continue Gabapentin as prescribed by podiatry and adjust as needed.  Return in 3 months for follow-up.

## 2021-12-18 NOTE — Progress Notes (Signed)
BP 125/75    Pulse 60    Temp 98.1 F (36.7 C) (Oral)    Ht 6\' 3"  (1.905 m)    Wt 197 lb 3.2 oz (89.4 kg)    SpO2 95%    BMI 24.65 kg/m    Subjective:    Patient ID: Brendan [name], male    DOB: November 16, 1951, 70 y.o.   MRN: [id number]  HPI: Brendan [name] is a 70 y.o. male  Chief Complaint  Patient presents with   Hypertension   Hyperlipidemia   Diabetes   Benign Prostatic Hypertrophy   COPD   Diarrhea    Patient states he has noticed some change in his bowel movements. Patient states he notices some diarrhea and constipation. Patient states sometimes it moves pretty good and sometime it doesn't. Patient states he has had 2 loose bowel movements today.    DIABETES Last A1C 7.3%.  Continues on Metformin 1000 MG BID and Farixga 10 MG daily.  Continues on Gabapentin 300 MG QHS started on 05/09/20 by podiatry for neuropathy -- last saw 12/06/21. Hypoglycemic episodes:no Polydipsia/polyuria: no Visual disturbance: no Chest pain: no Paresthesias: no Glucose Monitoring: yes             Accucheck frequency: Daily             Fasting glucose: 90 to low 100's, occasional 120             Post prandial:              Evening:             Before meals: Taking Insulin?: no             Long acting insulin:             Short acting insulin: Blood Pressure Monitoring: not checking Retinal Examination: Up To Date  Foot Exam: Up to Date Pneumovax: Up to Date Influenza: Up to Date Aspirin: yes    HYPERTENSION / HYPERLIPIDEMIA Continues on Metoprolol, Lasix, Hygroton, Amlodipine, K+, and Lipitor.   Followed by cardiology and last seen by Dr. Saunders Revel on 04/13/21. Allergic to Lisinopril.  Last echo showed EF 60-65% in April 2021.  Satisfied with current treatment? yes Duration of hypertension: chronic BP monitoring frequency: a few days a week BP range: 110-120/70 range BP medication side effects: no Duration of hyperlipidemia: chronic Cholesterol medication side effects: no Cholesterol  supplements: none Medication compliance: good compliance Aspirin: yes Recent stressors: no Recurrent headaches: no Visual changes: no Palpitations: no Dyspnea: no Chest pain: no Lower extremity edema: no Dizzy/lightheaded: no    COPD Has not smoked in 3 years since his bypass surgery.   Has Singulair he takes daily and Albuterol inhaler, but does not use this. Last Lung CT screening 07/22/21 -- noted aortic atherosclerosis which was noted on past imaging and has been discussed with patient + emphysema and hepatic steatosis + left adrenal nodule which is stable. COPD status: stable Satisfied with current treatment?: yes Oxygen use: no Dyspnea frequency: none Cough frequency: none Rescue inhaler frequency: once this year Limitation of activity: no Productive cough: none Last Spirometry: June 2022 - FEV1 64% and FEV1/FVC 73% Pneumovax: Up to Date Influenza: Up to Date   BPH Continues on Tamsulosin 0.4 MG. BPH status: stable Satisfied with current treatment?: yes Medication side effects: no Medication compliance: good compliance Duration: chronic Nocturia: 1/night Urinary frequency:no Incomplete voiding: no Urgency: no Weak urinary stream: no Straining to start stream: no Dysuria: no  BOWEL ISSUES Has been having fluctuation between bowels at home, at times constipation and then occasional diarrhea.  Started about 3 weeks ago having issues.  No recent acute illness or changes in diet.  BM fluctuate -- sometimes 2-3 times a day and sometimes hard to pass one daily. Status: fluctuating Treatments attempted: none Fever: no Nausea: no Vomiting: no Weight loss: yes, has been trying Decreased appetite: no Diarrhea: yes Constipation: yes Blood in stool: no Heartburn: no Jaundice: no  Depression screen [facility] 2/9 12/18/2021 04/17/2021 01/17/2021 07/13/2020 03/28/2020  Decreased Interest 0 0 0 0 0  Down, Depressed, Hopeless 0 0 0 0 0  PHQ - 2 Score 0 0 0 0 0  Altered sleeping  0 - - - -  Tired, decreased energy 0 - - - -  Change in appetite 0 - - - -  Feeling bad or failure about yourself  0 - - - -  Trouble concentrating 0 - - - -  Moving slowly or fidgety/restless 0 - - - -  Suicidal thoughts 0 - - - -  PHQ-9 Score 0 - - - -  Difficult doing work/chores Not difficult at all - - - -     Relevant past medical, surgical, family and social history reviewed and updated as indicated. Interim medical history since our last visit reviewed. Allergies and medications reviewed and updated.  Review of Systems  Constitutional:  Negative for activity change, diaphoresis, fatigue and fever.  Respiratory:  Negative for cough, chest tightness, shortness of breath and wheezing.   Cardiovascular:  Negative for chest pain, palpitations and leg swelling.  Gastrointestinal: Negative.   Endocrine: Negative for cold intolerance, heat intolerance, polydipsia, polyphagia and polyuria.  Neurological: Negative.   Psychiatric/Behavioral: Negative.     Per HPI unless specifically indicated above     Objective:    BP 125/75    Pulse 60    Temp 98.1 F (36.7 C) (Oral)    Ht 6\' 3"  (1.905 m)    Wt 197 lb 3.2 oz (89.4 kg)    SpO2 95%    BMI 24.65 kg/m   Wt Readings from Last 3 Encounters:  12/18/21 197 lb 3.2 oz (89.4 kg)  10/12/21 202 lb (91.6 kg)  09/18/21 202 lb 9.6 oz (91.9 kg)    Physical Exam Vitals and nursing note reviewed.  Constitutional:      General: He is awake. He is not in acute distress.    Appearance: He is well-developed and overweight. He is not ill-appearing.  HENT:     Head: Normocephalic and atraumatic.     Right Ear: Hearing normal. No drainage.     Left Ear: Hearing normal. No drainage.  Eyes:     General: Lids are normal.        Right eye: No discharge.        Left eye: No discharge.     Conjunctiva/sclera: Conjunctivae normal.     Pupils: Pupils are equal, round, and reactive to light.  Neck:     Vascular: No carotid bruit.  Cardiovascular:      Rate and Rhythm: Normal rate and regular rhythm.     Heart sounds: Normal heart sounds, S1 normal and S2 normal. No murmur heard.   No gallop.  Pulmonary:     Effort: Pulmonary effort is normal. No accessory muscle usage or respiratory distress.     Breath sounds: Normal breath sounds.  Abdominal:     General: Bowel sounds are normal. There is  no distension.     Palpations: Abdomen is soft.     Tenderness: There is no abdominal tenderness. There is no right CVA tenderness or left CVA tenderness.  Musculoskeletal:     Cervical back: Normal range of motion and neck supple.     Right lower leg: No edema.     Left lower leg: No edema.  Skin:    General: Skin is warm and dry.  Neurological:     Mental Status: He is alert and oriented to person, place, and time.  Psychiatric:        Attention and Perception: Attention normal.        Mood and Affect: Mood normal.        Speech: Speech normal.        Behavior: Behavior normal. Behavior is cooperative.      Results for orders placed or performed in visit on 12/18/21  Bayer DCA Hb A1c Waived  Result Value Ref Range   HB A1C (BAYER DCA - WAIVED) 6.8 (H) 4.8 - 5.6 %      Assessment & Plan:   Problem List Items Addressed This Visit       Cardiovascular and Mediastinum   Aortic atherosclerosis (HCC)    Noted on CT imaging.  Continue ASA and Lipitor daily for prevention.  Quit smoking 2 years ago.        Chronic heart failure with preserved ejection fraction (HFpEF) (HCC)    Chronic, stable.  Euvolemic.  Continue collaboration with cardiology and current medication regimen as ordered by them.  On Farxiga for prevention.  Recommend: - Reminded to call for an overnight weight gain of >2 pounds or a weekly weight weight of >5 pounds - not adding salt to his food and has been reading food labels. Reviewed the importance of keeping daily sodium intake to 2000mg  daily  - Avoid Ibuprofen medications      Hypertension associated with  diabetes (HCC)    Chronic, ongoing with BP at goal today and at goal on home readings.  Continue current medication regimen and adjust as needed.  Continue collaboration with cardiology.  Recommend he monitor BP at least a few times a week at home and document.  DASH diet focus.  Labs up to date.  Return in 3 months.      Relevant Orders   Bayer DCA Hb A1c Waived (Completed)   Peripheral vascular disease (HCC)     Respiratory   Centrilobular emphysema (HCC)    Chronic, stable with minimal use of Albuterol.  PFT this year FEV1 64% and FEV1/FVC 73%, remaining stable from previous.  Continue to monitor and adjust regimen as needed.  Continue Albuterol and recommend he use this if wheezing present or SOB.  Continue cessation of smoking and continue yearly lung screening.        Endocrine   Controlled type 2 diabetes with neuropathy (HCC) - Primary    Chronic, ongoing.  A1C 6.8%, decrease from previous 7.3%. Urine ALB 10 and A:C <29 March 2021. Continue current medication regimen and checking BS at home daily.  Continue Metformin and Marcelline Deist as ordered, may need assist with Marcelline Deist in future.  Will not change medications today, recommend heavy focus on diet with less pasta and potatoes.  Continue Gabapentin as prescribed by podiatry and adjust as needed.  Return in 3 months for follow-up.         Relevant Orders   Bayer DCA Hb A1c Waived (Completed)   Hyperlipidemia associated  with type 2 diabetes mellitus (HCC)    Chronic, ongoing.  Continue current medication regimen and adjust as needed.  Lipid panel up to date.  Recent LDL <70.      Relevant Orders   Bayer DCA Hb A1c Waived (Completed)     Genitourinary   BPH (benign prostatic hyperplasia)    Chronic, stable.  Continue current medication regimen, Flomax, and adjust as needed.        Other   Adrenal nodule (HCC)    Left side, noted on CT lung screening, stable on recent check = possible adenoma reported.  Continue to monitor on  screenings and send to nephrology as needed.      Constipation    Ongoing -- IBS at baseline.  Recommend starting Metamucil or Benefiber daily.  If ongoing symptoms or issues return to office.      Other Visit Diagnoses     Need for shingles vaccine       Shingrix script sent.   Relevant Medications   Zoster Vaccine Adjuvanted [facility]) injection   Zoster Vaccine Adjuvanted [facility]) injection (Start on 02/21/2022)        Follow up plan: Return in about 3 months (around 03/18/2022) for T2DM, HTN/HLD, COPD.

## 2022-02-26 ENCOUNTER — Ambulatory Visit: Payer: Medicare HMO | Admitting: Podiatry

## 2022-02-26 ENCOUNTER — Other Ambulatory Visit: Payer: Self-pay

## 2022-02-26 ENCOUNTER — Encounter: Payer: Self-pay | Admitting: Podiatry

## 2022-02-26 DIAGNOSIS — I739 Peripheral vascular disease, unspecified: Secondary | ICD-10-CM | POA: Diagnosis not present

## 2022-02-26 DIAGNOSIS — E1142 Type 2 diabetes mellitus with diabetic polyneuropathy: Secondary | ICD-10-CM

## 2022-02-26 DIAGNOSIS — L819 Disorder of pigmentation, unspecified: Secondary | ICD-10-CM

## 2022-02-26 MED ORDER — GABAPENTIN 300 MG PO CAPS: ORAL_CAPSULE | ORAL | 3 refills | Status: DC

## 2022-02-26 NOTE — Progress Notes (Signed)
He presents today for his diabetic neuropathy states that he is doing pretty good with 300 mg of gabapentin at lunch and 600 mg of gabapentin at bedtime.  He states that he is doing well with that and has minimal other problems.  Objective: Vital signs are stable he is alert oriented x3 pulses are palpable.  Neurologic sensorium is intact per Semmes Weinstein monofilament deep tendon reflexes are intact muscle strength is normal and symmetrical bilateral.  Mild pes planus bilateral mild hammertoe deformities.  No open lesions or wounds are noted.  Toenails appear to be normal.  Assessment: Diabetic peripheral neuropathy with diabetes mellitus minimal complications.  Plan: We will continue his current regimen of gabapentin 300 mg at lunch and 600 mg at bedtime refilled his prescription today for 1 year.  Follow-up with me in 1 year

## 2022-03-16 NOTE — Patient Instructions (Incomplete)

## 2022-03-19 ENCOUNTER — Ambulatory Visit: Payer: Medicare HMO | Admitting: Nurse Practitioner

## 2022-03-19 DIAGNOSIS — E1159 Type 2 diabetes mellitus with other circulatory complications: Secondary | ICD-10-CM

## 2022-03-19 DIAGNOSIS — I5032 Chronic diastolic (congestive) heart failure: Secondary | ICD-10-CM

## 2022-03-19 DIAGNOSIS — I739 Peripheral vascular disease, unspecified: Secondary | ICD-10-CM

## 2022-03-19 DIAGNOSIS — K760 Fatty (change of) liver, not elsewhere classified: Secondary | ICD-10-CM

## 2022-03-19 DIAGNOSIS — I700 Atherosclerosis of aorta: Secondary | ICD-10-CM

## 2022-03-19 DIAGNOSIS — E538 Deficiency of other specified B group vitamins: Secondary | ICD-10-CM

## 2022-03-19 DIAGNOSIS — J432 Centrilobular emphysema: Secondary | ICD-10-CM

## 2022-03-19 DIAGNOSIS — E1169 Type 2 diabetes mellitus with other specified complication: Secondary | ICD-10-CM

## 2022-03-19 DIAGNOSIS — E1140 Type 2 diabetes mellitus with diabetic neuropathy, unspecified: Secondary | ICD-10-CM

## 2022-03-19 DIAGNOSIS — E278 Other specified disorders of adrenal gland: Secondary | ICD-10-CM

## 2022-03-25 NOTE — Patient Instructions (Signed)

## 2022-03-27 DIAGNOSIS — H35033 Hypertensive retinopathy, bilateral: Secondary | ICD-10-CM | POA: Diagnosis not present

## 2022-03-27 DIAGNOSIS — E119 Type 2 diabetes mellitus without complications: Secondary | ICD-10-CM | POA: Diagnosis not present

## 2022-03-27 DIAGNOSIS — H26493 Other secondary cataract, bilateral: Secondary | ICD-10-CM | POA: Diagnosis not present

## 2022-03-27 LAB — HM DIABETES EYE EXAM

## 2022-03-30 ENCOUNTER — Encounter: Payer: Self-pay | Admitting: Nurse Practitioner

## 2022-03-30 ENCOUNTER — Ambulatory Visit (INDEPENDENT_AMBULATORY_CARE_PROVIDER_SITE_OTHER): Payer: Medicare HMO | Admitting: Nurse Practitioner

## 2022-03-30 VITALS — BP 129/75 | HR 56 | Temp 97.8°F | Ht 75.0 in | Wt 201.2 lb

## 2022-03-30 DIAGNOSIS — J432 Centrilobular emphysema: Secondary | ICD-10-CM

## 2022-03-30 DIAGNOSIS — I739 Peripheral vascular disease, unspecified: Secondary | ICD-10-CM

## 2022-03-30 DIAGNOSIS — I152 Hypertension secondary to endocrine disorders: Secondary | ICD-10-CM

## 2022-03-30 DIAGNOSIS — I5032 Chronic diastolic (congestive) heart failure: Secondary | ICD-10-CM | POA: Diagnosis not present

## 2022-03-30 DIAGNOSIS — E278 Other specified disorders of adrenal gland: Secondary | ICD-10-CM | POA: Diagnosis not present

## 2022-03-30 DIAGNOSIS — K760 Fatty (change of) liver, not elsewhere classified: Secondary | ICD-10-CM

## 2022-03-30 DIAGNOSIS — I2510 Atherosclerotic heart disease of native coronary artery without angina pectoris: Secondary | ICD-10-CM

## 2022-03-30 DIAGNOSIS — I700 Atherosclerosis of aorta: Secondary | ICD-10-CM

## 2022-03-30 DIAGNOSIS — E1159 Type 2 diabetes mellitus with other circulatory complications: Secondary | ICD-10-CM | POA: Diagnosis not present

## 2022-03-30 DIAGNOSIS — Z23 Encounter for immunization: Secondary | ICD-10-CM

## 2022-03-30 DIAGNOSIS — Z87891 Personal history of nicotine dependence: Secondary | ICD-10-CM

## 2022-03-30 DIAGNOSIS — E1140 Type 2 diabetes mellitus with diabetic neuropathy, unspecified: Secondary | ICD-10-CM

## 2022-03-30 DIAGNOSIS — E279 Disorder of adrenal gland, unspecified: Secondary | ICD-10-CM

## 2022-03-30 DIAGNOSIS — I77810 Thoracic aortic ectasia: Secondary | ICD-10-CM

## 2022-03-30 DIAGNOSIS — E785 Hyperlipidemia, unspecified: Secondary | ICD-10-CM

## 2022-03-30 DIAGNOSIS — E1169 Type 2 diabetes mellitus with other specified complication: Secondary | ICD-10-CM | POA: Diagnosis not present

## 2022-03-30 DIAGNOSIS — E538 Deficiency of other specified B group vitamins: Secondary | ICD-10-CM

## 2022-03-30 LAB — MICROALBUMIN, URINE WAIVED
Creatinine, Urine Waived: 100 mg/dL (ref 10–300)
Microalb, Ur Waived: 10 mg/L (ref 0–19)
Microalb/Creat Ratio: <30 mg/g (ref <30)

## 2022-03-30 LAB — BAYER DCA HB A1C WAIVED: HB A1C (BAYER DCA - WAIVED): 6.9 % — ABNORMAL HIGH (ref 4.8–5.6)

## 2022-03-30 MED ORDER — ALBUTEROL SULFATE HFA 108 (90 BASE) MCG/ACT IN AERS: 2.0000 | INHALATION_SPRAY | Freq: Four times a day (QID) | RESPIRATORY_TRACT | 4 refills | Status: AC | PRN

## 2022-03-30 MED ORDER — MONTELUKAST SODIUM 10 MG PO TABS: 10.0000 mg | ORAL_TABLET | Freq: Every day | ORAL | 4 refills | Status: DC

## 2022-03-30 MED ORDER — METOPROLOL TARTRATE 25 MG PO TABS: 12.5000 mg | ORAL_TABLET | Freq: Two times a day (BID) | ORAL | 4 refills | Status: DC

## 2022-03-30 MED ORDER — AMLODIPINE BESYLATE 5 MG PO TABS: 5.0000 mg | ORAL_TABLET | Freq: Every day | ORAL | 4 refills | Status: DC

## 2022-03-30 NOTE — Assessment & Plan Note (Signed)
Chronic, ongoing.  A1C 6.9%, remaining stable. Urine ALB 10 and A:C <29 March 2022. Continue current medication regimen and checking BS at home daily.  Continue Metformin and Marcelline Deist as ordered, may need assist with Marcelline Deist in future.  Will not change medications today, recommend heavy focus on diet with less pasta and potatoes.  Continue Gabapentin as prescribed by podiatry and adjust as needed based on renal function.  Return in 6 months for follow-up.   ? ?

## 2022-03-30 NOTE — Assessment & Plan Note (Signed)
Chronic, stable with minimal use of Albuterol.  PFT in 2022 was FEV1 64% and FEV1/FVC 73%, remaining stable from previous.  Continue to monitor and adjust regimen as needed.  Continue Albuterol and recommend he use this if wheezing present or SOB.  Continue cessation of smoking and continue yearly lung screening.  Spirometry annually. ?

## 2022-03-30 NOTE — Assessment & Plan Note (Signed)
Ongoing, continue supplement.  Recheck level today. 

## 2022-03-30 NOTE — Progress Notes (Signed)
? ?BP 129/75   Pulse (!) 56   Temp 97.8 ?F (36.6 ?C) (Oral)   Ht 6\' 3"  (1.905 m)   Wt 201 lb 3.2 oz (91.3 kg)   SpO2 94%   BMI 25.15 kg/m?   ? ?Subjective:  ? ? Patient ID: Brendan [name], male    DOB: 1951-05-27, 71 y.o.   MRN: [id number] ? ?HPI: ?Brendan [name] is a 71 y.o. male ? ?Chief Complaint  ?Patient presents with  ? Diabetes  ?  Patient recent Diabetic Eye Exam requested at today's visit.   ? Hyperlipidemia  ? Hypertension  ? COPD  ? ?DIABETES ?A1c at last visit was 6.8%.  Continues on Metformin 1000 MG BID and Farixga 10 MG daily.   ? ?Continues on Gabapentin 300 MG in morning and 600 MG QHS, followed by podiatry with last visit 02/26/22. ?Hypoglycemic episodes:no ?Polydipsia/polyuria: no ?Visual disturbance: no ?Chest pain: no ?Paresthesias: no ?Glucose Monitoring: yes ?            Accucheck frequency: Daily ?            Fasting glucose: 90 to 110 range, occasional 120 ?            Post prandial:  ?            Evening: ?            Before meals: ?Taking Insulin?: no ?            Long acting insulin: ?            Short acting insulin: ?Blood Pressure Monitoring: not checking ?Retinal Examination: Up To Date -- Dr. 02/28/22 ?Foot Exam: Up to Date ?Pneumovax: Up to Date ?Influenza: Up to Date ?Aspirin: yes  ?  ?HYPERTENSION / HYPERLIPIDEMIA ?Continues on Metoprolol, Lasix, Hygroton, Amlodipine, K+, and Lipitor. Has not had to use any NTG. ? ?Followed by cardiology and last seen by Dr. Clydene Pugh on 04/13/21 -- returns this April.  Allergic to Lisinopril. Last echo showed EF 60-65% in April 2021.  ?Satisfied with current treatment? yes ?Duration of hypertension: chronic ?BP monitoring frequency: a few days a week ?BP range: 110-120/70-80 range ?BP medication side effects: no ?Duration of hyperlipidemia: chronic ?Cholesterol medication side effects: no ?Cholesterol supplements: none ?Medication compliance: good compliance ?Aspirin: yes ?Recent stressors: no ?Recurrent headaches: no ?Visual changes: no ?Palpitations:  no ?Dyspnea: no ?Chest pain: no ?Lower extremity edema: no ?Dizzy/lightheaded: no  ?  ?COPD ?Has Singulair he takes daily and Albuterol inhaler, has only had to use this a couple a times. Last lung CT screening 07/20/21 -- noted aortic atherosclerosis which was noted on past imaging and has been discussed with patient + emphysema.  This also noted hepatic steatosis + left adrenal nodule which is stable at 18 mm, previous was 19 mm. ? ?Has not smoked in 5 years since bypass surgery. ?COPD status: stable ?Satisfied with current treatment?: yes ?Oxygen use: no ?Dyspnea frequency: none ?Cough frequency: none ?Rescue inhaler frequency: once this year ?Limitation of activity: no ?Productive cough: none ?Last Spirometry: June 2022 - FEV1 64% and FEV1/FVC 73% ?Pneumovax: Up to Date ?Influenza: Up to Date  ? ? ?  03/30/2022  ?  2:35 PM 12/18/2021  ?  2:02 PM 04/17/2021  ?  1:50 PM 01/17/2021  ?  1:29 PM 07/13/2020  ?  3:22 PM  ?Depression screen PHQ 2/9  ?Decreased Interest 0 0 0 0 0  ?Down, Depressed, Hopeless 0 0 0 0 0  ?PHQ -  2 Score 0 0 0 0 0  ?Altered sleeping 0 0     ?Tired, decreased energy 1 0     ?Change in appetite 0 0     ?Feeling bad or failure about yourself  0 0     ?Trouble concentrating 0 0     ?Moving slowly or fidgety/restless 0 0     ?Suicidal thoughts 0 0     ?PHQ-9 Score 1 0     ?Difficult doing work/chores  Not difficult at all     ?  ?Relevant past medical, surgical, family and social history reviewed and updated as indicated. Interim medical history since our last visit reviewed. ?Allergies and medications reviewed and updated. ? ?Review of Systems  ?Constitutional:  Negative for activity change, diaphoresis, fatigue and fever.  ?Respiratory:  Negative for cough, chest tightness, shortness of breath and wheezing.   ?Cardiovascular:  Negative for chest pain, palpitations and leg swelling.  ?Gastrointestinal: Negative.   ?Endocrine: Negative for cold intolerance, heat intolerance, polydipsia, polyphagia and  polyuria.  ?Neurological: Negative.   ?Psychiatric/Behavioral: Negative.    ? ?Per HPI unless specifically indicated above ? ?   ?Objective:  ?  ?BP 129/75   Pulse (!) 56   Temp 97.8 ?F (36.6 ?C) (Oral)   Ht 6\' 3"  (1.905 m)   Wt 201 lb 3.2 oz (91.3 kg)   SpO2 94%   BMI 25.15 kg/m?   ?Wt Readings from Last 3 Encounters:  ?03/30/22 201 lb 3.2 oz (91.3 kg)  ?12/18/21 197 lb 3.2 oz (89.4 kg)  ?10/12/21 202 lb (91.6 kg)  ?  ?Physical Exam ?Vitals and nursing note reviewed.  ?Constitutional:   ?   General: He is awake. He is not in acute distress. ?   Appearance: He is well-developed and overweight. He is not ill-appearing.  ?HENT:  ?   Head: Normocephalic and atraumatic.  ?   Right Ear: Hearing normal. No drainage.  ?   Left Ear: Hearing normal. No drainage.  ?Eyes:  ?   General: Lids are normal.     ?   Right eye: No discharge.     ?   Left eye: No discharge.  ?   Conjunctiva/sclera: Conjunctivae normal.  ?   Pupils: Pupils are equal, round, and reactive to light.  ?Neck:  ?   Vascular: No carotid bruit.  ?Cardiovascular:  ?   Rate and Rhythm: Regular rhythm. Bradycardia present.  ?   Heart sounds: Normal heart sounds, S1 normal and S2 normal. No murmur heard. ?  No gallop.  ?Pulmonary:  ?   Effort: Pulmonary effort is normal. No accessory muscle usage or respiratory distress.  ?   Breath sounds: Normal breath sounds.  ?Abdominal:  ?   General: Bowel sounds are normal. There is no distension.  ?   Palpations: Abdomen is soft.  ?   Tenderness: There is no abdominal tenderness. There is no right CVA tenderness or left CVA tenderness.  ?Musculoskeletal:  ?   Cervical back: Normal range of motion and neck supple.  ?   Right lower leg: No edema.  ?   Left lower leg: No edema.  ?Skin: ?   General: Skin is warm and dry.  ?Neurological:  ?   Mental Status: He is alert and oriented to person, place, and time.  ?Psychiatric:     ?   Attention and Perception: Attention normal.     ?   Mood and Affect: Mood normal.     ?  Speech: Speech normal.     ?   Behavior: Behavior normal. Behavior is cooperative.  ? ?Diabetic Foot Exam - Simple   ?Simple Foot Form ?Visual Inspection ?No deformities, no ulcerations, no other skin breakdown bilaterally: Yes ?Sensation Testing ?Intact to touch and monofilament testing bilaterally: Yes ?Pulse Check ?Posterior Tibialis and Dorsalis pulse intact bilaterally: Yes ?Comments ?  ? ?Results for orders placed or performed in visit on 03/30/22  ?Bayer DCA Hb A1c Waived  ?Result Value Ref Range  ? HB A1C (BAYER DCA - WAIVED) 6.9 (H) 4.8 - 5.6 %  ?Microalbumin, Urine Waived  ?Result Value Ref Range  ? Microalb, Ur Waived 10 0 - 19 mg/L  ? Creatinine, Urine Waived 100 10 - 300 mg/dL  ? Microalb/Creat Ratio <30 <30 mg/g  ? ?   ?Assessment & Plan:  ? ?Problem List Items Addressed This Visit   ? ?  ? Cardiovascular and Mediastinum  ? Aortic atherosclerosis (HCC)  ?  Noted on CT imaging.  Continue ASA and Lipitor daily for prevention.  Quit smoking 5 years ago.   ?  ?  ? Relevant Medications  ? amLODipine (NORVASC) 5 MG tablet  ? metoprolol tartrate (LOPRESSOR) 25 MG tablet  ? Other Relevant Orders  ? Comprehensive metabolic panel  ? Lipid Panel w/o Chol/HDL Ratio  ? Chronic heart failure with preserved ejection fraction (HFpEF) (HCC)  ?  Chronic, stable.  Euvolemic.  Continue collaboration with cardiology and current medication regimen as ordered by them.  On Farxiga for prevention. ? Recommend: ?- Reminded to call for an overnight weight gain of >2 pounds or a weekly weight weight of >5 pounds ?- not adding salt to his food and has been reading food labels. Reviewed the importance of keeping daily sodium intake to 2000mg  daily  ?- Avoid Ibuprofen medications ?  ?  ? Relevant Medications  ? amLODipine (NORVASC) 5 MG tablet  ? metoprolol tartrate (LOPRESSOR) 25 MG tablet  ? Other Relevant Orders  ? Comprehensive metabolic panel  ? Coronary artery disease  ?  Continue collaboration with cardiology. ?  ?  ?  Relevant Medications  ? amLODipine (NORVASC) 5 MG tablet  ? metoprolol tartrate (LOPRESSOR) 25 MG tablet  ? Other Relevant Orders  ? Comprehensive metabolic panel  ? Lipid Panel w/o Chol/HDL Ratio  ? Dilated aortic root (HC

## 2022-03-30 NOTE — Assessment & Plan Note (Signed)
Chronic, ongoing.  Continue current medication regimen and adjust as needed.  Lipid panel today.  Recent LDL <70. 

## 2022-03-30 NOTE — Assessment & Plan Note (Signed)
Chronic, stable.  Euvolemic.  Continue collaboration with cardiology and current medication regimen as ordered by them.  On Farxiga for prevention. ° Recommend: °- Reminded to call for an overnight weight gain of >2 pounds or a weekly weight weight of >5 pounds °- not adding salt to his food and has been reading food labels. Reviewed the importance of keeping daily sodium intake to <2000mg daily  °- Avoid Ibuprofen medications °

## 2022-03-30 NOTE — Assessment & Plan Note (Signed)
Continue annual lung screening.  Recommend continued cessation. ?

## 2022-03-30 NOTE — Assessment & Plan Note (Signed)
Chronic, stable.  Continue statin and ASA daily at home for prevention. ?

## 2022-03-30 NOTE — Assessment & Plan Note (Signed)
Noted on lung screening July 2021 -- recommend focus on healthy diet and will continue to monitor. 

## 2022-03-30 NOTE — Assessment & Plan Note (Signed)
Left side, noted on CT lung screening, stable on recent check = possible adenoma reported.  Continue to monitor on screenings and send to nephrology as needed. °

## 2022-03-30 NOTE — Assessment & Plan Note (Signed)
Noted on CT imaging.  Continue ASA and Lipitor daily for prevention.  Quit smoking 5 years ago.   ?

## 2022-03-30 NOTE — Assessment & Plan Note (Signed)
Chronic, ongoing with BP at goal today and at goal on home readings.  Continue current medication regimen and adjust as needed.  Continue collaboration with cardiology.  Recommend he monitor BP at least a few times a week at home and document.  DASH diet focus.  Lipid and CMP today.  Return in 6 months. ?

## 2022-03-30 NOTE — Assessment & Plan Note (Signed)
Continue collaboration with cardiology.  

## 2022-03-30 NOTE — Assessment & Plan Note (Signed)
Followed by cardiology, continue this collaboration. ?

## 2022-03-31 LAB — COMPREHENSIVE METABOLIC PANEL
ALT: 25 IU/L (ref 0–44)
AST: 20 IU/L (ref 0–40)
Albumin/Globulin Ratio: 2.4 — ABNORMAL HIGH (ref 1.2–2.2)
Albumin: 4.8 g/dL (ref 3.8–4.8)
Alkaline Phosphatase: 54 IU/L (ref 44–121)
BUN/Creatinine Ratio: 16 (ref 10–24)
BUN: 23 mg/dL (ref 8–27)
Bilirubin Total: 0.4 mg/dL (ref 0.0–1.2)
CO2: 27 mmol/L (ref 20–29)
Calcium: 10 mg/dL (ref 8.6–10.2)
Chloride: 105 mmol/L (ref 96–106)
Creatinine, Ser: 1.43 mg/dL — ABNORMAL HIGH (ref 0.76–1.27)
Globulin, Total: 2 g/dL (ref 1.5–4.5)
Glucose: 99 mg/dL (ref 70–99)
Potassium: 4.5 mmol/L (ref 3.5–5.2)
Sodium: 148 mmol/L — ABNORMAL HIGH (ref 134–144)
Total Protein: 6.8 g/dL (ref 6.0–8.5)
eGFR: 53 mL/min/{1.73_m2} — ABNORMAL LOW (ref >59)

## 2022-03-31 LAB — LIPID PANEL W/O CHOL/HDL RATIO
Cholesterol, Total: 113 mg/dL (ref 100–199)
HDL: 30 mg/dL — ABNORMAL LOW (ref >39)
LDL Chol Calc (NIH): 66 mg/dL (ref 0–99)
Triglycerides: 86 mg/dL (ref 0–149)
VLDL Cholesterol Cal: 17 mg/dL (ref 5–40)

## 2022-03-31 LAB — VITAMIN B12: Vitamin B-12: 791 pg/mL (ref 232–1245)

## 2022-03-31 NOTE — Progress Notes (Signed)
Good morning crew, please let Brendan [name] know his labs have returned.   ?- Liver function is normal.   ?- Kidney function is showing a little downward trend this check, some mild kidney disease, and sodium (salt) is up a little. I would like you to increase your water intake a little + add some lemon to this.  Reduce salt intake at home too.  We will recheck next visit.   ?- Cholesterol levels remain at goal, continue medication. ?- B12 level remains stable, continue supplement.  Any questions? ?Keep being amazing!!  Thank you for allowing me to participate in your care.  I appreciate you. ?Kindest regards, ?Brailee Riede ?

## 2022-04-06 ENCOUNTER — Ambulatory Visit: Payer: Medicare HMO

## 2022-04-18 ENCOUNTER — Telehealth: Payer: Self-pay | Admitting: Nurse Practitioner

## 2022-04-18 ENCOUNTER — Ambulatory Visit: Payer: Medicare HMO

## 2022-04-18 NOTE — Telephone Encounter (Signed)
Copied from CRM [id number]. Topic: Appointment Scheduling - Scheduling Inquiry for Clinic ?>> Apr 18, 2022  1:12 PM Pawlus, Maxine Glenn A wrote: ?Reason for CRM: Pt stated he was supposed to get a call at 1pm for his AWV, pt stated he is still waiting for a call, please advise. ?

## 2022-04-18 NOTE — Telephone Encounter (Signed)
Pt called back to get his AWV appt rescheduled, please advise.  ?

## 2022-04-19 NOTE — Telephone Encounter (Signed)
Called patient and rescheduled appointment. PJR ?

## 2022-04-20 ENCOUNTER — Ambulatory Visit: Payer: Medicare HMO

## 2022-04-25 ENCOUNTER — Ambulatory Visit (INDEPENDENT_AMBULATORY_CARE_PROVIDER_SITE_OTHER): Payer: Medicare HMO | Admitting: *Deleted

## 2022-04-25 DIAGNOSIS — Z0000 Encounter for general adult medical examination without abnormal findings: Secondary | ICD-10-CM

## 2022-04-25 NOTE — Progress Notes (Signed)
? ?Subjective:  ? Brendan [name] is a 71 y.o. male who presents for Medicare Annual/Subsequent preventive examination. ? ?I connected with  Brendan [name] on 04/25/22 by a telephone enabled telemedicine application and verified that I am speaking with the correct person using two identifiers. ?  ?I discussed the limitations of evaluation and management by telemedicine. The patient expressed understanding and agreed to proceed. ? ?Patient location: home ? ?Provider location:  [place] not in office ? ? ?Review of Systems    ? ?Cardiac Risk Factors include: advanced age (>47men, >77 women);diabetes mellitus;male gender;obesity (BMI >30kg/m2);family history of premature cardiovascular disease ? ?   ?Objective:  ?  ?Today's Vitals  ? ?There is no height or weight on file to calculate BMI. ? ? ?  04/25/2022  ?  1:07 PM 04/17/2021  ?  1:49 PM 03/28/2020  ?  1:58 PM 03/26/2019  ?  2:02 PM 03/20/2018  ?  1:14 PM 07/02/2017  ?  6:00 AM 06/18/2017  ? 10:15 AM  ?Advanced Directives  ?Does Patient Have a Medical Advance Directive? No No No No No No No  ?Would patient like information on creating a medical advance directive? No - Patient declined  Yes (MAU/Ambulatory/Procedural Areas - Information given) Yes (MAU/Ambulatory/Procedural Areas - Information given) Yes (MAU/Ambulatory/Procedural Areas - Information given) No - Patient declined   ? ? ?Current Medications (verified) ?Outpatient Encounter Medications as of 04/25/2022  ?Medication Sig  ? acetaminophen (TYLENOL) 500 MG tablet Take 1 tablet (500 mg total) by mouth every 6 (six) hours as needed for mild pain or headache.  ? albuterol (VENTOLIN HFA) 108 (90 Base) MCG/ACT inhaler Inhale 2 puffs into the lungs every 6 (six) hours as needed for wheezing or shortness of breath.  ? amLODipine (NORVASC) 5 MG tablet Take 1 tablet (5 mg total) by mouth daily.  ? aspirin EC 81 MG tablet Take 1 tablet (81 mg total) by mouth daily.  ? atorvastatin (LIPITOR) 20 MG tablet Take 1 tablet (20 mg  total) by mouth daily.  ? chlorthalidone (HYGROTON) 25 MG tablet Take 1 tablet (25 mg total) by mouth daily.  ? cyanocobalamin 1000 MCG tablet Take 1,000 mcg by mouth every other day.  ? dapagliflozin propanediol (FARXIGA) 10 MG TABS tablet Take 1 tablet (10 mg total) by mouth daily before breakfast.  ? furosemide (LASIX) 20 MG tablet Take 1 tablet (20 mg total) by mouth daily as needed.  ? gabapentin (NEURONTIN) 300 MG capsule Take one capsule at lunchtime and two capsules at bedtime  ? loratadine (CLARITIN) 10 MG tablet Take 1 tablet (10 mg total) by mouth daily.  ? metFORMIN (GLUCOPHAGE) 1000 MG tablet Take 1 tablet (1,000 mg total) by mouth 2 (two) times daily with a meal.  ? metoprolol tartrate (LOPRESSOR) 25 MG tablet Take 0.5 tablets (12.5 mg total) by mouth 2 (two) times daily.  ? montelukast (SINGULAIR) 10 MG tablet Take 1 tablet (10 mg total) by mouth at bedtime.  ? potassium chloride SA (KLOR-CON) 20 MEQ tablet Take 2 tablets (40 meq) by mouth once daily  ? sildenafil (REVATIO) 20 MG tablet TAKE 1 TO 5 TABLETS AS NEEDED.  ? tamsulosin (FLOMAX) 0.4 MG CAPS capsule Take 1 capsule (0.4 mg total) by mouth daily.  ? azelastine (ASTELIN) 0.1 % nasal spray Place 2 sprays into both nostrils at bedtime.  (Patient not taking: Reported on 04/25/2022)  ? nitroGLYCERIN (NITROSTAT) 0.4 MG SL tablet Place 1 tablet (0.4 mg total) under the tongue every 5 (  five) minutes as needed for chest pain.  ? ?No facility-administered encounter medications on file as of 04/25/2022.  ? ? ?Allergies (verified) ?Lisinopril  ? ?History: ?Past Medical History:  ?Diagnosis Date  ? CAD (coronary artery disease)   ? a. 05/2016 Lexi MV: EF 58%, no ischemia/infarct; b. 05/2017 Cath: LM 20-30, LAD 50p, 7m, D1 50p, D2 80ost, LCX 90/70ost, RCA 80/90p/m, RPL1 50p, EF 50-55%; c. 06/2017 CABG x 4 (LIMA->LAD, VG->D2,VG->OM, VG->PDA).  ? Diabetes mellitus without complication ([place])   ? Diastolic dysfunction   ? a. 05/2016 Echo: EF 55%, Gr1 DD, mild TR/MR.   ? Hyperlipidemia   ? Hypertension   ? Hypokalemia   ? Personal history of tobacco use, presenting hazards to health 05/03/2016  ? Post-op Afib   ? a. 06/2017 following CABG->short course of amio.  ? ?Past Surgical History:  ?Procedure Laterality Date  ? BACK SURGERY    ? CARDIAC CATHETERIZATION    ? CORONARY ARTERY BYPASS GRAFT N/A 07/01/2017  ? Procedure: CORONARY ARTERY BYPASS GRAFTING (CABG) x4, using left mammary artery and right greater saphenous vein harvested endoscopically;  Surgeon: Ivin Poot, MD;  Location: [place] [facility];  Service: Open Heart Surgery;  Laterality: N/A;  ? KNEE SURGERY    ? LEFT HEART CATH AND CORONARY ANGIOGRAPHY N/A 06/04/2017  ? Procedure: Left Heart Cath and Coronary Angiography;  Surgeon: Nelva Bush, MD;  Location: [facility];  Service: Cardiovascular;  Laterality: N/A;  ? TEE WITHOUT CARDIOVERSION N/A 07/01/2017  ? Procedure: TRANSESOPHAGEAL ECHOCARDIOGRAM (TEE);  Surgeon: Prescott Gum, Collier Salina, MD;  Location: [facility];  Service: Open Heart Surgery;  Laterality: N/A;  ? ?Family History  ?Problem Relation Age of Onset  ? Hypertension Mother   ? Diabetes Mother   ? Heart disease Father 79  ?     multiple MI's  ? ?Social History  ? ?Socioeconomic History  ? Marital status: Widowed  ?  Spouse name: Not on file  ? Number of children: Not on file  ? Years of education: Not on file  ? Highest education level: 10th grade  ?Occupational History  ? Occupation: works part time as Retail buyer   ?Tobacco Use  ? Smoking status: Former  ?  Packs/day: 1.00  ?  Years: 60.00  ?  Pack years: 60.00  ?  Types: Cigarettes  ?  Quit date: 06/29/2017  ?  Years since quitting: 4.8  ? Smokeless tobacco: Never  ?Vaping Use  ? Vaping Use: Never used  ?Substance and Sexual Activity  ? Alcohol use: No  ?  Comment: previously drank heavily but quit ~ 21 yrs ago.  ? Drug use: No  ? Sexual activity: Yes  ?Other Topics Concern  ? Not on file  ?Social History Narrative  ? Lives in [place] by himself.  Works as Retail buyer.  Does  not routinely exercise.  ? ?Social Determinants of Health  ? ?Financial Resource Strain: Low Risk   ? Difficulty of Paying Living Expenses: Not hard at all  ?Food Insecurity: No Food Insecurity  ? Worried About Charity fundraiser in the Last Year: Never true  ? Ran Out of Food in the Last Year: Never true  ?Transportation Needs: No Transportation Needs  ? Lack of Transportation (Medical): No  ? Lack of Transportation (Non-Medical): No  ?Physical Activity: Unknown  ? Days of Exercise per Week: 5 days  ? Minutes of Exercise per Session: Not on file  ?Stress: No Stress Concern Present  ? Feeling of  Stress : Not at all  ?Social Connections: Moderately Isolated  ? Frequency of Communication with Friends and Family: Once a week  ? Frequency of Social Gatherings with Friends and Family: Three times a week  ? Attends Religious Services: 1 to 4 times per year  ? Active Member of Clubs or Organizations: No  ? Attends Archivist Meetings: Never  ? Marital Status: Widowed  ? ? ?Tobacco Counseling ?Counseling given: Not Answered ? ? ?Clinical Intake: ? ?Pre-visit preparation completed: Yes ? ?Pain : No/denies pain ? ?  ? ?Nutritional Risks: None ?Diabetes: Yes ?CBG done?: No ?Did pt. bring in CBG monitor from home?: No ? ?How often do you need to have someone help you when you read instructions, pamphlets, or other written materials from your doctor or pharmacy?: 1 - Never ? ?Diabetic?  Yes ? ?Nutrition Risk Assessment: ? ?Has the patient had any N/V/D within the last 2 months?  No  ?Does the patient have any non-healing wounds?  No  ?Has the patient had any unintentional weight loss or weight gain?  No  ? ?Diabetes: ? ?Is the patient diabetic?  Yes  ?If diabetic, was a CBG obtained today?  No  ?Did the patient bring in their glucometer from home?  No  ?How often do you monitor your CBG's? 1 to 3 times a week sometimes more.  ? ?Financial Strains and Diabetes Management: ? ?Are you having any financial strains with  the device, your supplies or your medication? No .  ?Does the patient want to be seen by Chronic Care Management for management of their diabetes?  No  ?Would the patient like to be referred to a Nutritioni

## 2022-04-25 NOTE — Patient Instructions (Signed)
Mr. Jesmer , ?Thank you for taking time to come for your Medicare Wellness Visit. I appreciate your ongoing commitment to your health goals. Please review the following plan we discussed and let me know if I can assist you in the future.  ? ?Screening recommendations/referrals: ?Colonoscopy: up to date ?Recommended yearly ophthalmology/optometry visit for glaucoma screening and checkup ?Recommended yearly dental visit for hygiene and checkup ? ?Vaccinations: ?Influenza vaccine: up to date ?Pneumococcal vaccine: up to date ?Tdap vaccine: up to date ?Shingles vaccine: Education provided   ? ?Advanced directives: Education provided ? ?Conditions/risks identified:  ? ?Next appointment: 10-02-2022  @ 2:00   [place] ? ?Preventive Care 71 Years and Older, Male ?Preventive care refers to lifestyle choices and visits with your health care provider that can promote health and wellness. ?What does preventive care include? ?A yearly physical exam. This is also called an annual well check. ?Dental exams once or twice a year. ?Routine eye exams. Ask your health care provider how often you should have your eyes checked. ?Personal lifestyle choices, including: ?Daily care of your teeth and gums. ?Regular physical activity. ?Eating a healthy diet. ?Avoiding tobacco and drug use. ?Limiting alcohol use. ?Practicing safe sex. ?Taking low doses of aspirin every day. ?Taking vitamin and mineral supplements as recommended by your health care provider. ?What happens during an annual well check? ?The services and screenings done by your health care provider during your annual well check will depend on your age, overall health, lifestyle risk factors, and family history of disease. ?Counseling  ?Your health care provider may ask you questions about your: ?Alcohol use. ?Tobacco use. ?Drug use. ?Emotional well-being. ?Home and relationship well-being. ?Sexual activity. ?Eating habits. ?History of falls. ?Memory and ability to understand  (cognition). ?Work and work Statistician. ?Screening  ?You may have the following tests or measurements: ?Height, weight, and BMI. ?Blood pressure. ?Lipid and cholesterol levels. These may be checked every 5 years, or more frequently if you are over 71 years old. ?Skin check. ?Lung cancer screening. You may have this screening every year starting at age 71 if you have a 30-pack-year history of smoking and currently smoke or have quit within the past 15 years. ?Fecal occult blood test (FOBT) of the stool. You may have this test every year starting at age 71. ?Flexible sigmoidoscopy or colonoscopy. You may have a sigmoidoscopy every 5 years or a colonoscopy every 10 years starting at age 71. ?Prostate cancer screening. Recommendations will vary depending on your family history and other risks. ?Hepatitis C blood test. ?Hepatitis B blood test. ?Sexually transmitted disease (STD) testing. ?Diabetes screening. This is done by checking your blood sugar (glucose) after you have not eaten for a while (fasting). You may have this done every 1-3 years. ?Abdominal aortic aneurysm (AAA) screening. You may need this if you are a current or former smoker. ?Osteoporosis. You may be screened starting at age 71 if you are at high risk. ?Talk with your health care provider about your test results, treatment options, and if necessary, the need for more tests. ?Vaccines  ?Your health care provider may recommend certain vaccines, such as: ?Influenza vaccine. This is recommended every year. ?Tetanus, diphtheria, and acellular pertussis (Tdap, Td) vaccine. You may need a Td booster every 10 years. ?Zoster vaccine. You may need this after age 71. ?Pneumococcal 13-valent conjugate (PCV13) vaccine. One dose is recommended after age 71. ?Pneumococcal polysaccharide (PPSV23) vaccine. One dose is recommended after age 71. ?Talk to your health care provider about which  screenings and vaccines you need and how often you need them. ?This  information is not intended to replace advice given to you by your health care provider. Make sure you discuss any questions you have with your health care provider. ?Document Released: 01/13/2016 Document Revised: 09/05/2016 Document Reviewed: 10/18/2015 ?Elsevier Interactive Patient Education ? 2017 [place]. ? ?Fall Prevention in the Home ?Falls can cause injuries. They can happen to people of all ages. There are many things you can do to make your home safe and to help prevent falls. ?What can I do on the outside of my home? ?Regularly fix the edges of walkways and driveways and fix any cracks. ?Remove anything that might make you trip as you walk through a door, such as a raised step or threshold. ?Trim any bushes or trees on the path to your home. ?Use bright outdoor lighting. ?Clear any walking paths of anything that might make someone trip, such as rocks or tools. ?Regularly check to see if handrails are loose or broken. Make sure that both sides of any steps have handrails. ?Any raised decks and porches should have guardrails on the edges. ?Have any leaves, snow, or ice cleared regularly. ?Use sand or salt on walking paths during winter. ?Clean up any spills in your garage right away. This includes oil or grease spills. ?What can I do in the bathroom? ?Use night lights. ?Install grab bars by the toilet and in the tub and shower. Do not use towel bars as grab bars. ?Use non-skid mats or decals in the tub or shower. ?If you need to sit down in the shower, use a plastic, non-slip stool. ?Keep the floor dry. Clean up any water that spills on the floor as soon as it happens. ?Remove soap buildup in the tub or shower regularly. ?Attach bath mats securely with double-sided non-slip rug tape. ?Do not have throw rugs and other things on the floor that can make you trip. ?What can I do in the bedroom? ?Use night lights. ?Make sure that you have a light by your bed that is easy to reach. ?Do not use any sheets or  blankets that are too big for your bed. They should not hang down onto the floor. ?Have a firm chair that has side arms. You can use this for support while you get dressed. ?Do not have throw rugs and other things on the floor that can make you trip. ?What can I do in the kitchen? ?Clean up any spills right away. ?Avoid walking on wet floors. ?Keep items that you use a lot in easy-to-reach places. ?If you need to reach something above you, use a strong step stool that has a grab bar. ?Keep electrical cords out of the way. ?Do not use floor polish or wax that makes floors slippery. If you must use wax, use non-skid floor wax. ?Do not have throw rugs and other things on the floor that can make you trip. ?What can I do with my stairs? ?Do not leave any items on the stairs. ?Make sure that there are handrails on both sides of the stairs and use them. Fix handrails that are broken or loose. Make sure that handrails are as long as the stairways. ?Check any carpeting to make sure that it is firmly attached to the stairs. Fix any carpet that is loose or worn. ?Avoid having throw rugs at the top or bottom of the stairs. If you do have throw rugs, attach them to the floor with carpet  tape. ?Make sure that you have a light switch at the top of the stairs and the bottom of the stairs. If you do not have them, ask someone to add them for you. ?What else can I do to help prevent falls? ?Wear shoes that: ?Do not have high heels. ?Have rubber bottoms. ?Are comfortable and fit you well. ?Are closed at the toe. Do not wear sandals. ?If you use a stepladder: ?Make sure that it is fully opened. Do not climb a closed stepladder. ?Make sure that both sides of the stepladder are locked into place. ?Ask someone to hold it for you, if possible. ?Clearly mark and make sure that you can see: ?Any grab bars or handrails. ?First and last steps. ?Where the edge of each step is. ?Use tools that help you move around (mobility aids) if they are  needed. These include: ?Canes. ?Walkers. ?Scooters. ?Crutches. ?Turn on the lights when you go into a dark area. Replace any light bulbs as soon as they burn out. ?Set up your furniture so you have a clear path. Avoid mov

## 2022-05-29 ENCOUNTER — Other Ambulatory Visit: Payer: Self-pay | Admitting: Internal Medicine

## 2022-07-17 ENCOUNTER — Telehealth: Payer: Self-pay | Admitting: Acute Care

## 2022-07-17 NOTE — Telephone Encounter (Signed)
Spoke with pt and rescheduled LDCT 08/01/22 2:00.

## 2022-07-18 ENCOUNTER — Ambulatory Visit: Payer: Medicare HMO | Admitting: Internal Medicine

## 2022-07-18 ENCOUNTER — Encounter: Payer: Self-pay | Admitting: Internal Medicine

## 2022-07-18 VITALS — BP 114/80 | HR 64 | Ht 75.0 in | Wt 198.0 lb

## 2022-07-18 DIAGNOSIS — I2510 Atherosclerotic heart disease of native coronary artery without angina pectoris: Secondary | ICD-10-CM

## 2022-07-18 DIAGNOSIS — E1169 Type 2 diabetes mellitus with other specified complication: Secondary | ICD-10-CM

## 2022-07-18 DIAGNOSIS — I5032 Chronic diastolic (congestive) heart failure: Secondary | ICD-10-CM

## 2022-07-18 DIAGNOSIS — E785 Hyperlipidemia, unspecified: Secondary | ICD-10-CM | POA: Diagnosis not present

## 2022-07-18 DIAGNOSIS — I10 Essential (primary) hypertension: Secondary | ICD-10-CM

## 2022-07-18 DIAGNOSIS — Z79899 Other long term (current) drug therapy: Secondary | ICD-10-CM

## 2022-07-18 MED ORDER — POTASSIUM CHLORIDE CRYS ER 20 MEQ PO TBCR: 20.0000 meq | EXTENDED_RELEASE_TABLET | Freq: Every day | ORAL | 0 refills | Status: DC

## 2022-07-18 NOTE — Progress Notes (Signed)
Follow-up Outpatient Visit Date: 07/18/2022  Primary Care Provider: Marjie Skiff, NP [address] [place] [address]  Chief Complaint: Follow-up coronary artery disease  HPI:  Brendan [name] is a 71 y.o. 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  I last saw him in 03/2021, at which time he felt relatively well but still noted orthostatic lightheadedness when standing up quickly.  This had not improved with de-escalation of furosemide.  Chronic exertional dyspnea with strenuous activities was also stable.  We did not make any medication changes or pursue additional testing.  Today, Mr. Seufert reports that he is feeling quite well.  He still has some fatigue when walking long distances.  He otherwise denies shortness of breath, chest pain, palpitations, and edema.  Brief orthostatic lightheadedness is also stable from prior visits.  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  Home BP's typical 110's/70's.  He is using furosemide daily.  --------------------------------------------------------------------------------------------------  Past Medical History:  Diagnosis Date   CAD (coronary artery disease)    a. 05/2016 Lexi MV: EF 58%, no ischemia/infarct; b. 05/2017 Cath: LM 20-30, LAD 50p, 40m, D1 50p, D2 80ost, LCX 90/70ost, RCA 80/90p/m, RPL1 50p, EF 50-55%; c. 06/2017 CABG x 4 (LIMA->LAD, VG->D2,VG->OM, VG->PDA).   Diabetes mellitus without complication ([facility])    Diastolic dysfunction    a. 05/2016 Echo: EF 55%, Gr1 DD, mild TR/MR.   Hyperlipidemia    Hypertension    Hypokalemia    Personal history of tobacco use, presenting hazards to health 05/03/2016   Post-op Afib    a. 06/2017 following CABG->short  course of amio.   Past Surgical History:  Procedure Laterality Date   BACK SURGERY     CARDIAC CATHETERIZATION     CORONARY ARTERY BYPASS GRAFT N/A 07/01/2017   Procedure: CORONARY ARTERY BYPASS GRAFTING (CABG) x4, using left mammary artery and right greater saphenous vein harvested endoscopically;  Surgeon: Kerin Perna, MD;  Location: [facility];  Service: Open Heart Surgery;  Laterality: N/A;   KNEE SURGERY     LEFT HEART CATH AND CORONARY ANGIOGRAPHY N/A 06/04/2017   Procedure: Left Heart Cath and Coronary Angiography;  Surgeon: Yvonne Kendall, MD;  Location: [facility];  Service: Cardiovascular;  Laterality: N/A;   TEE WITHOUT CARDIOVERSION N/A 07/01/2017   Procedure: TRANSESOPHAGEAL ECHOCARDIOGRAM (TEE);  Surgeon: Donata Clay, Theron Arista, MD;  Location[facility];  Service: Open Heart Surgery;  Laterality: N/A;    Current Meds  Medication Sig   acetaminophen (TYLENOL) 500 MG tablet Take 1 tablet (500 mg total) by mouth every 6 (six) hours as needed for mild pain or headache.   albuterol (VENTOLIN HFA) 108 (90 Base) MCG/ACT inhaler Inhale 2 puffs into the lungs every 6 (six) hours as needed for wheezing or shortness of breath.   amLODipine (NORVASC) 5 MG tablet Take 1 tablet (5 mg total) by mouth daily.   aspirin EC 81 MG tablet Take 1 tablet (81 mg total) by mouth daily.   atorvastatin (LIPITOR) 20 MG tablet Take 1 tablet (20 mg total) by mouth daily.   azelastine (ASTELIN) 0.1 % nasal spray Place 2 sprays into both nostrils at bedtime.   chlorthalidone (HYGROTON) 25 MG tablet Take 1  tablet (25 mg total) by mouth daily.   cyanocobalamin 1000 MCG tablet Take 1,000 mcg by mouth every other day.   dapagliflozin propanediol (FARXIGA) 10 MG TABS tablet Take 1 tablet (10 mg total) by mouth daily before breakfast.   furosemide (LASIX) 20 MG tablet Take 1 tablet (20 mg total) by mouth daily as needed.   gabapentin (NEURONTIN) 300 MG capsule Take one capsule at lunchtime and two capsules at bedtime    loratadine (CLARITIN) 10 MG tablet Take 1 tablet (10 mg total) by mouth daily.   metFORMIN (GLUCOPHAGE) 1000 MG tablet Take 1 tablet (1,000 mg total) by mouth 2 (two) times daily with a meal.   metoprolol tartrate (LOPRESSOR) 25 MG tablet Take 0.5 tablets (12.5 mg total) by mouth 2 (two) times daily.   montelukast (SINGULAIR) 10 MG tablet Take 1 tablet (10 mg total) by mouth at bedtime.   potassium chloride SA (KLOR-CON M) 20 MEQ tablet Take 2 tablets (40 meq) by mouth once daily   sildenafil (REVATIO) 20 MG tablet TAKE 1 TO 5 TABLETS AS NEEDED.   tamsulosin (FLOMAX) 0.4 MG CAPS capsule Take 1 capsule (0.4 mg total) by mouth daily.    Allergies: Lisinopril  Social History   Tobacco Use   Smoking status: Former    Packs/day: 1.00    Years: 60.00    Total pack years: 60.00    Types: Cigarettes    Quit date: 06/29/2017    Years since quitting: 5.0   Smokeless tobacco: Never  Vaping Use   Vaping Use: Never used  Substance Use Topics   Alcohol use: No    Comment: previously drank heavily but quit ~ 21 yrs ago.   Drug use: No    Family History  Problem Relation Age of Onset   Hypertension Mother    Diabetes Mother    Heart disease Father 48       multiple MI's    Review of Systems: A 12-system review of systems was performed and was negative except as noted in the HPI.  --------------------------------------------------------------------------------------------------  Physical Exam: BP 114/80 (BP Location: Left Arm, Patient Position: Sitting, Cuff Size: Normal)   Pulse 64   Ht 6\' 3"  (1.905 m)   Wt 198 lb (89.8 kg)   SpO2 95%   BMI 24.75 kg/m   General:  NAD. Neck: No JVD or HJR. Lungs: Clear to auscultation bilaterally without wheezes or crackles. Heart: Regular rate and rhythm without murmurs, rubs, or gallops. Abdomen: Soft, nontender, nondistended. Extremities: No lower extremity edema.  EKG:  NSR with PAC's and anterior T wave inversions.  PAC's are new  since 04/13/2021.  Anterior T wave inversions are also more pronounced.  Lab Results  Component Value Date   WBC 7.4 09/18/2021   HGB 16.5 09/18/2021   HCT 47.8 09/18/2021   MCV 87 09/18/2021   PLT 213 09/18/2021    Lab Results  Component Value Date   NA 148 (H) 03/30/2022   K 4.5 03/30/2022   CL 105 03/30/2022   CO2 27 03/30/2022   BUN 23 03/30/2022   CREATININE 1.43 (H) 03/30/2022   GLUCOSE 99 03/30/2022   ALT 25 03/30/2022    Lab Results  Component Value Date   CHOL 113 03/30/2022   HDL 30 (L) 03/30/2022   LDLCALC 66 03/30/2022   TRIG 86 03/30/2022   CHOLHDL 3.5 08/21/2018    --------------------------------------------------------------------------------------------------  ASSESSMENT AND PLAN: Coronary artery disease: No angina reported.  Anterior T wave inversions are more  pronounced than on prior tracing.  However, in the absence of symptoms and normal MPI in 04/2020, we have agreed to continue current medications for secondary prevention and defer additional testing.  Chronic HFpEF: Mr. Binsfeld appears euvolemic with stable NYHA class II symptoms.  As he is on both furosemide and HCTZ with intermittent orthostatic lightheadedness, we have agreed to stop furosemide altogether and decrease potassium chloride to 20 mEq daily.  We will obtain a BMP in ~2 weeks to ensure stable renal function and potassium with aforementioned medication changes.  Hypertension: BP reasonable.  We will stop furosemide but otherwise continue current medications.  Hyperlipidemia associated with type 2 diabetes mellitus: LDL at goal on last check in March.  Continue atorvastatin 20 mg daily.  Ongoing management of DM per PCP.  Follow-up: Return to clinic in 1 year.  Yvonne Kendall, MD 07/18/2022 3:35 PM

## 2022-07-18 NOTE — Patient Instructions (Signed)
Medication Instructions:   Your physician has recommended you make the following change in your medication:   STOP Lasix (furosemide)  DECREASE Potassium 20 mEq daily   *If you need a refill on your cardiac medications before your next appointment, please call your pharmacy*   Lab Work:  Your physician recommends that you return for lab work in: 2 WEEKS (BMET)  -  Please go to the [facility] Entrance at [facility] -  Check in at the Registration Desk: 1st desk to the right, past the screening table -  Valet Parking is offered if needed -  No appointment needed -  Lab hours: Monday- Friday (7:30 am- 5:30 pm)    Testing/Procedures:  None ordered   Follow-Up: At [facility], you and your health needs are our priority.  As part of our continuing mission to provide you with exceptional heart care, we have created designated Provider Care Teams.  These Care Teams include your primary Cardiologist (physician) and Advanced Practice Providers (APPs -  Physician Assistants and Nurse Practitioners) who all work together to provide you with the care you need, when you need it.  We recommend signing up for the patient portal called "MyChart".  Sign up information is provided on this After Visit Summary.  MyChart is used to connect with patients for Virtual Visits (Telemedicine).  Patients are able to view lab/test results, encounter notes, upcoming appointments, etc.  Non-urgent messages can be sent to your provider as well.   To learn more about what you can do with MyChart, go to [email or web address].    Your next appointment:   1 year(s)  The format for your next appointment:   In Person  Provider:   You may see Yvonne Kendall, MD or one of the following Advanced Practice Providers on your designated Care Team:   Nicolasa Ducking, NP Eula Listen, PA-C Cadence Fransico Michael, PA-C{   Important Information About Sugar

## 2022-07-19 ENCOUNTER — Encounter: Payer: Self-pay | Admitting: Internal Medicine

## 2022-07-20 ENCOUNTER — Ambulatory Visit: Admission: RE | Admit: 2022-07-20 | Payer: Medicare HMO | Source: Ambulatory Visit

## 2022-07-26 ENCOUNTER — Other Ambulatory Visit
Admission: RE | Admit: 2022-07-26 | Discharge: 2022-07-26 | Disposition: A | Discharge status: Home / Self Care | Payer: Medicare HMO | Attending: Internal Medicine | Admitting: Internal Medicine

## 2022-07-26 DIAGNOSIS — Z79899 Other long term (current) drug therapy: Secondary | ICD-10-CM | POA: Insufficient documentation

## 2022-07-26 DIAGNOSIS — I5032 Chronic diastolic (congestive) heart failure: Secondary | ICD-10-CM | POA: Diagnosis not present

## 2022-07-26 LAB — BASIC METABOLIC PANEL
Anion gap: 10 (ref 5–15)
BUN: 23 mg/dL (ref 8–23)
CO2: 23 mmol/L (ref 22–32)
Calcium: 9.8 mg/dL (ref 8.9–10.3)
Chloride: 110 mmol/L (ref 98–111)
Creatinine, Ser: 1.16 mg/dL (ref 0.61–1.24)
GFR, Estimated: >60 mL/min (ref >60)
Glucose, Bld: 144 mg/dL — ABNORMAL HIGH (ref 70–99)
Potassium: 3.4 mmol/L — ABNORMAL LOW (ref 3.5–5.1)
Sodium: 143 mmol/L (ref 135–145)

## 2022-07-27 ENCOUNTER — Telehealth: Payer: Self-pay | Admitting: *Deleted

## 2022-07-27 NOTE — Telephone Encounter (Signed)
-----   Message from Yvonne Kendall, MD sent at 07/27/2022  6:45 AM EDT ----- Please let Mr. Kauer know that his kidney function is normal.  His potassium is a little bit low.  Given her recent medication adjustments, I suggest he increase his potassium supplementation back to potassium chloride 40 mEq daily.  No other medication changes are recommended at this time.

## 2022-07-27 NOTE — Telephone Encounter (Signed)
Attempted to call pt. No answer. No voicemail. Will try to reach pt again.

## 2022-07-30 MED ORDER — POTASSIUM CHLORIDE CRYS ER 20 MEQ PO TBCR
40.0000 meq | EXTENDED_RELEASE_TABLET | Freq: Every day | ORAL | 5 refills | Status: DC
Start: 2022-07-30 — End: 2022-09-05

## 2022-07-30 NOTE — Telephone Encounter (Signed)
Pt notified of lab results and Dr. Serita Kyle recc.  Pt voiced understanding.  Pt will incr potassium chloride to 40 mEq daily. Pt currently takes potassium chloride 20 mEq daily and will take 2 tablets daily.  New Rx sent to pt's pharmacy.  Pt has no further questions at this time.

## 2022-07-30 NOTE — Telephone Encounter (Signed)
Attempted to reach pt. No answer x 2. Unable to leave voicemail.

## 2022-08-01 ENCOUNTER — Ambulatory Visit: Admission: RE | Admit: 2022-08-01 | Payer: Medicare HMO | Source: Ambulatory Visit

## 2022-08-07 ENCOUNTER — Ambulatory Visit
Admission: RE | Admit: 2022-08-07 | Discharge: 2022-08-07 | Disposition: A | Discharge status: Home / Self Care | Payer: Medicare HMO | Source: Ambulatory Visit | Attending: Acute Care | Admitting: Acute Care

## 2022-08-07 DIAGNOSIS — Z87891 Personal history of nicotine dependence: Secondary | ICD-10-CM | POA: Diagnosis not present

## 2022-08-09 ENCOUNTER — Encounter: Payer: Self-pay | Admitting: Nurse Practitioner

## 2022-08-09 ENCOUNTER — Other Ambulatory Visit: Payer: Self-pay

## 2022-08-09 DIAGNOSIS — I272 Pulmonary hypertension, unspecified: Secondary | ICD-10-CM | POA: Insufficient documentation

## 2022-08-09 DIAGNOSIS — Z122 Encounter for screening for malignant neoplasm of respiratory organs: Secondary | ICD-10-CM

## 2022-08-09 DIAGNOSIS — Z87891 Personal history of nicotine dependence: Secondary | ICD-10-CM

## 2022-08-27 ENCOUNTER — Ambulatory Visit: Payer: Medicare HMO | Admitting: Podiatry

## 2022-09-04 ENCOUNTER — Other Ambulatory Visit: Payer: Self-pay | Admitting: Internal Medicine

## 2022-09-13 DIAGNOSIS — S99921A Unspecified injury of right foot, initial encounter: Secondary | ICD-10-CM | POA: Diagnosis not present

## 2022-09-13 DIAGNOSIS — S92411A Displaced fracture of proximal phalanx of right great toe, initial encounter for closed fracture: Secondary | ICD-10-CM | POA: Diagnosis not present

## 2022-09-13 DIAGNOSIS — W06XXXA Fall from bed, initial encounter: Secondary | ICD-10-CM | POA: Diagnosis not present

## 2022-09-13 DIAGNOSIS — S92414A Nondisplaced fracture of proximal phalanx of right great toe, initial encounter for closed fracture: Secondary | ICD-10-CM | POA: Diagnosis not present

## 2022-09-25 DIAGNOSIS — S92411D Displaced fracture of proximal phalanx of right great toe, subsequent encounter for fracture with routine healing: Secondary | ICD-10-CM | POA: Diagnosis not present

## 2022-09-25 DIAGNOSIS — E119 Type 2 diabetes mellitus without complications: Secondary | ICD-10-CM | POA: Diagnosis not present

## 2022-09-25 DIAGNOSIS — M79671 Pain in right foot: Secondary | ICD-10-CM | POA: Diagnosis not present

## 2022-10-02 ENCOUNTER — Ambulatory Visit: Payer: Medicare HMO | Admitting: Nurse Practitioner

## 2022-10-03 ENCOUNTER — Encounter: Payer: Self-pay | Admitting: Podiatry

## 2022-10-03 ENCOUNTER — Ambulatory Visit: Payer: Medicare HMO | Admitting: Podiatry

## 2022-10-03 DIAGNOSIS — E1142 Type 2 diabetes mellitus with diabetic polyneuropathy: Secondary | ICD-10-CM | POA: Diagnosis not present

## 2022-10-03 DIAGNOSIS — I739 Peripheral vascular disease, unspecified: Secondary | ICD-10-CM | POA: Diagnosis not present

## 2022-10-03 MED ORDER — GABAPENTIN 300 MG PO CAPS: ORAL_CAPSULE | ORAL | 3 refills | Status: DC

## 2022-10-03 NOTE — Progress Notes (Signed)
He presents today for follow-up of his diabetic peripheral neuropathy currently he is taking gabapentin 300 mg in the morning and 600 mg at bedtime.  He states that he is doing just fine with that not having any problems states that he recently broke his great toe on the right foot but [facility] is taking care of that for this.  Objective: Vital signs are stable he is alert and oriented x3.  There is no erythema to some mild edema to the right foot with some hyperpigmentation around the hallux right.  Nontender on palpation.  Otherwise toenails are thick yellow dystrophic.  No open lesions or wounds.  Assessment: Diabetic peripheral neuropathy with some nail dystrophy and a fractured hallux right.  Plan: Follow-up with me in 6 months for diabetic check and neuropathy check.

## 2022-10-05 ENCOUNTER — Ambulatory Visit: Payer: Medicare HMO | Admitting: Internal Medicine

## 2022-10-07 NOTE — Patient Instructions (Signed)
Diabetes Mellitus Basics  Diabetes mellitus, or diabetes, is a long-term (chronic) disease. It occurs when the body does not properly use sugar (glucose) that is released from food after you eat. Diabetes mellitus may be caused by one or both of these problems: Your pancreas does not make enough of a hormone called insulin. Your body does not react in a normal way to the insulin that it makes. Insulin lets glucose enter cells in your body. This gives you energy. If you have diabetes, glucose cannot get into cells. This causes high blood glucose (hyperglycemia). How to treat and manage diabetes You may need to take insulin or other diabetes medicines daily to keep your glucose in balance. If you are prescribed insulin, you will learn how to give yourself insulin by injection. You may need to adjust the amount of insulin you take based on the foods that you eat. You will need to check your blood glucose levels using a glucose monitor as told by your health care provider. The readings can help determine if you have low or high blood glucose. Generally, you should have these blood glucose levels: Before meals (preprandial): 80-130 mg/dL (4.4-7.2 mmol/L). After meals (postprandial): below 180 mg/dL (10 mmol/L). Hemoglobin A1c (HbA1c) level: less than 7%. Your health care provider will set treatment goals for you. Keep all follow-up visits. This is important. Follow these instructions at home: Diabetes medicines Take your diabetes medicines every day as told by your health care provider. List your diabetes medicines here: Name of medicine: ______________________________ Amount (dose): _______________ Time (a.m./p.m.): _______________ Notes: ___________________________________ Name of medicine: ______________________________ Amount (dose): _______________ Time (a.m./p.m.): _______________ Notes: ___________________________________ Name of medicine: ______________________________ Amount (dose):  _______________ Time (a.m./p.m.): _______________ Notes: ___________________________________ Insulin If you use insulin, list the types of insulin you use here: Insulin type: ______________________________ Amount (dose): _______________ Time (a.m./p.m.): _______________Notes: ___________________________________ Insulin type: ______________________________ Amount (dose): _______________ Time (a.m./p.m.): _______________ Notes: ___________________________________ Insulin type: ______________________________ Amount (dose): _______________ Time (a.m./p.m.): _______________ Notes: ___________________________________ Insulin type: ______________________________ Amount (dose): _______________ Time (a.m./p.m.): _______________ Notes: ___________________________________ Insulin type: ______________________________ Amount (dose): _______________ Time (a.m./p.m.): _______________ Notes: ___________________________________ Managing blood glucose  Check your blood glucose levels using a glucose monitor as told by your health care provider. Write down the times that you check your glucose levels here: Time: _______________ Notes: ___________________________________ Time: _______________ Notes: ___________________________________ Time: _______________ Notes: ___________________________________ Time: _______________ Notes: ___________________________________ Time: _______________ Notes: ___________________________________ Time: _______________ Notes: ___________________________________  Low blood glucose Low blood glucose (hypoglycemia) is when glucose is at or below 70 mg/dL (3.9 mmol/L). Symptoms may include: Feeling: Hungry. Sweaty and clammy. Irritable or easily upset. Dizzy. Sleepy. Having: A fast heartbeat. A headache. A change in your vision. Numbness around the mouth, lips, or tongue. Having trouble with: Moving (coordination). Sleeping. Treating low blood glucose To treat low blood  glucose, eat or drink something containing sugar right away. If you can think clearly and swallow safely, follow the 15:15 rule: Take 15 grams of a fast-acting carb (carbohydrate), as told by your health care provider. Some fast-acting carbs are: Glucose tablets: take 3-4 tablets. Hard candy: eat 3-5 pieces. Fruit juice: drink 4 oz (120 mL). Regular (not diet) soda: drink 4-6 oz (120-180 mL). Honey or sugar: eat 1 Tbsp (15 mL). Check your blood glucose levels 15 minutes after you take the carb. If your glucose is still at or below 70 mg/dL (3.9 mmol/L), take 15 grams of a carb again. If your glucose does not go above 70 mg/dL (3.9 mmol/L) after   3 tries, get help right away. After your glucose goes back to normal, eat a meal or a snack within 1 hour. Treating very low blood glucose If your glucose is at or below 54 mg/dL (3 mmol/L), you have very low blood glucose (severe hypoglycemia). This is an emergency. Do not wait to see if the symptoms will go away. Get medical help right away. Call your local emergency services (911 in the [place] Do not drive yourself to the hospital. Questions to ask your health care provider Should I talk with a diabetes educator? What equipment will I need to care for myself at home? What diabetes medicines do I need? When should I take them? How often do I need to check my blood glucose levels? What number can I call if I have questions? When is my follow-up visit? Where can I find a support group for people with diabetes? Where to find more information American Diabetes Association: www.diabetes.org Association of Diabetes Care and Education Specialists: www.diabeteseducator.org Contact a health care provider if: Your blood glucose is at or above 240 mg/dL (13.3 mmol/L) for 2 days in a row. You have been sick or have had a fever for 2 days or more, and you are not getting better. You have any of these problems for more than 6 hours: You cannot eat or  drink. You feel nauseous. You vomit. You have diarrhea. Get help right away if: Your blood glucose is lower than 54 mg/dL (3 mmol/L). You get confused. You have trouble thinking clearly. You have trouble breathing. These symptoms may represent a serious problem that is an emergency. Do not wait to see if the symptoms will go away. Get medical help right away. Call your local emergency services (911 in the [place] Do not drive yourself to the hospital. Summary Diabetes mellitus is a chronic disease that occurs when the body does not properly use sugar (glucose) that is released from food after you eat. Take insulin and diabetes medicines as told. Check your blood glucose every day, as often as told. Keep all follow-up visits. This is important. This information is not intended to replace advice given to you by your health care provider. Make sure you discuss any questions you have with your health care provider. Document Revised: 04/19/2020 Document Reviewed: 04/19/2020 Elsevier Patient Education  2023 Elsevier Inc.  

## 2022-10-12 ENCOUNTER — Encounter: Payer: Self-pay | Admitting: Nurse Practitioner

## 2022-10-12 ENCOUNTER — Ambulatory Visit (INDEPENDENT_AMBULATORY_CARE_PROVIDER_SITE_OTHER): Payer: Medicare HMO | Admitting: Nurse Practitioner

## 2022-10-12 VITALS — BP 122/72 | HR 69 | Temp 97.5°F | Ht 75.0 in | Wt 206.2 lb

## 2022-10-12 DIAGNOSIS — I5032 Chronic diastolic (congestive) heart failure: Secondary | ICD-10-CM | POA: Diagnosis not present

## 2022-10-12 DIAGNOSIS — J432 Centrilobular emphysema: Secondary | ICD-10-CM | POA: Diagnosis not present

## 2022-10-12 DIAGNOSIS — E1159 Type 2 diabetes mellitus with other circulatory complications: Secondary | ICD-10-CM

## 2022-10-12 DIAGNOSIS — E785 Hyperlipidemia, unspecified: Secondary | ICD-10-CM | POA: Diagnosis not present

## 2022-10-12 DIAGNOSIS — I77810 Thoracic aortic ectasia: Secondary | ICD-10-CM

## 2022-10-12 DIAGNOSIS — E278 Other specified disorders of adrenal gland: Secondary | ICD-10-CM

## 2022-10-12 DIAGNOSIS — I2510 Atherosclerotic heart disease of native coronary artery without angina pectoris: Secondary | ICD-10-CM

## 2022-10-12 DIAGNOSIS — I739 Peripheral vascular disease, unspecified: Secondary | ICD-10-CM

## 2022-10-12 DIAGNOSIS — I272 Pulmonary hypertension, unspecified: Secondary | ICD-10-CM

## 2022-10-12 DIAGNOSIS — Z23 Encounter for immunization: Secondary | ICD-10-CM | POA: Diagnosis not present

## 2022-10-12 DIAGNOSIS — K760 Fatty (change of) liver, not elsewhere classified: Secondary | ICD-10-CM

## 2022-10-12 DIAGNOSIS — E1140 Type 2 diabetes mellitus with diabetic neuropathy, unspecified: Secondary | ICD-10-CM | POA: Diagnosis not present

## 2022-10-12 DIAGNOSIS — E1169 Type 2 diabetes mellitus with other specified complication: Secondary | ICD-10-CM | POA: Diagnosis not present

## 2022-10-12 DIAGNOSIS — I152 Hypertension secondary to endocrine disorders: Secondary | ICD-10-CM | POA: Diagnosis not present

## 2022-10-12 LAB — BAYER DCA HB A1C WAIVED: HB A1C (BAYER DCA - WAIVED): 6.5 % — ABNORMAL HIGH (ref 4.8–5.6)

## 2022-10-12 MED ORDER — TAMSULOSIN HCL 0.4 MG PO CAPS: 0.4000 mg | ORAL_CAPSULE | Freq: Every day | ORAL | 4 refills | Status: DC

## 2022-10-12 MED ORDER — ATORVASTATIN CALCIUM 20 MG PO TABS
20.0000 mg | ORAL_TABLET | Freq: Every day | ORAL | 4 refills | Status: DC
Start: 2022-10-12 — End: 2023-12-12

## 2022-10-12 MED ORDER — DAPAGLIFLOZIN PROPANEDIOL 10 MG PO TABS
10.0000 mg | ORAL_TABLET | Freq: Every day | ORAL | 4 refills | Status: DC
Start: 2022-10-12 — End: 2023-10-16

## 2022-10-12 MED ORDER — CHLORTHALIDONE 25 MG PO TABS: 25.0000 mg | ORAL_TABLET | Freq: Every day | ORAL | 4 refills | Status: DC

## 2022-10-12 MED ORDER — METFORMIN HCL 1000 MG PO TABS: 1000.0000 mg | ORAL_TABLET | Freq: Two times a day (BID) | ORAL | 4 refills | Status: DC

## 2022-10-12 NOTE — Assessment & Plan Note (Signed)
Chronic, ongoing.  Continue current medication regimen and adjust as needed.  Lipid panel today.  Recent LDL <70. 

## 2022-10-12 NOTE — Assessment & Plan Note (Signed)
Stable on imaging.  Left side, noted on CT lung screening, stable on recent check = possible adenoma reported.  Continue to monitor on screenings and send to nephrology as needed.

## 2022-10-12 NOTE — Assessment & Plan Note (Addendum)
Chronic, ongoing.  A1c 6.5%, remaining stable. Urine ALB 10 and A:C <29 March 2022. Continue current medication regimen and checking BS at home daily.  Continue Metformin and Farxiga as ordered.  Will not change medications today, recommend heavy focus on diet with less pasta and potatoes.  Continue Gabapentin as prescribed by podiatry and adjust as needed based on renal function.  Return in 6 months for follow-up.

## 2022-10-12 NOTE — Assessment & Plan Note (Signed)
Chronic, stable with minimal use of Albuterol.  PFT in 2022 was FEV1 64% and FEV1/FVC 73%, remaining stable.  Continue to monitor and adjust regimen as needed.  Continue Albuterol and recommend he use this if wheezing present or SOB.  Continue cessation of smoking and continue yearly lung screening.  Spirometry annually -- obtain next visit.

## 2022-10-12 NOTE — Assessment & Plan Note (Signed)
Chronic, stable.  BP at goal in office and on home readings.  Continue current medication regimen and adjust as needed.  Continue collaboration with cardiology.  Recommend he monitor BP at least a few times a week at home and document.  DASH diet focus.  LABS: Lipid and CMP today.  Return in 6 months.

## 2022-10-12 NOTE — Assessment & Plan Note (Signed)
Chronic.  Continue collaboration with cardiology and medication regimen as ordered.  No recent NTG use.

## 2022-10-12 NOTE — Progress Notes (Signed)
BP 122/72 (BP Location: Left Arm, Patient Position: Sitting, Cuff Size: Normal)   Pulse 69   Temp (!) 97.5 F (36.4 C) (Oral)   Ht 6\' 3"  (1.905 m)   Wt 206 lb 3.2 oz (93.5 kg)   SpO2 92%   BMI 25.77 kg/m    Subjective:    Patient ID: Brendan [name], male    DOB: 1951/07/16, 71 y.o.   MRN: [id number]  HPI: Brendan [name] is a 71 y.o. male  Chief Complaint  Patient presents with   Diabetes   Hypertension   Hyperlipidemia   COPD   DIABETES A1c 6.9% in March.  Continues on Metformin 1000 MG BID and Farixga 10 MG daily.    Continues on Gabapentin 300 MG in morning and 600 MG QHS, followed by podiatry with last visit 10/03/22 -- he recently fractured right great toe and is in boot due to this, reports he rolled out of bed and did this by accident. Hypoglycemic episodes:no Polydipsia/polyuria: no Visual disturbance: no Chest pain: no Paresthesias: no Glucose Monitoring: yes             Accucheck frequency: Daily             Fasting glucose: 90 to 110 on average, every once and awhile 120             Post prandial:              Evening:             Before meals: Taking Insulin?: no             Long acting insulin:             Short acting insulin: Blood Pressure Monitoring: not checking Retinal Examination: Up To Date -- Dr. 12/03/22 Exam: Up to Date Pneumovax: Up to Date Influenza: Up to Date Aspirin: yes    HYPERTENSION / HYPERLIPIDEMIA Continues on Metoprolol, Hygroton, Amlodipine, and Lipitor. Has not had to use any NTG.  Allergic to Lisinopril. Has not had to use NTG in a long while.  Followed by cardiology with last visit 07/18/22, they stopped Lasix and decreased K+ at visit.  Last echo showed EF 60-65% in April 2021.  Satisfied with current treatment? yes Duration of hypertension: chronic BP monitoring frequency: a few days a week BP range: 120/70-80 range BP medication side effects: no Duration of hyperlipidemia: chronic Cholesterol medication side effects:  no Cholesterol supplements: none Medication compliance: good compliance Aspirin: yes Recent stressors: no Recurrent headaches: no Visual changes: no Palpitations: no Dyspnea: no Chest pain: no Lower extremity edema: no Dizzy/lightheaded: no    COPD Continues on Singulair and Albuterol -- last time he used was yesterday (first time he has had to use in over 6 months). Last lung CT screening 08/07/22-- noted aortic atherosclerosis which was noted on past imaging and has been discussed with patient + emphysema.  This also noted hepatic steatosis + left adrenal nodule which is stable at 1.8 cm.  Also noted to have some pulmonary artery enlargement.  Has not smoked in >5 years since bypass surgery. COPD status: stable Satisfied with current treatment?: yes Oxygen use: no Dyspnea frequency: none Cough frequency: none Rescue inhaler frequency: once this year Limitation of activity: no Productive cough: none Last Spirometry: June 2022 - FEV1 64% and FEV1/FVC 73% Pneumovax: Up to Date Influenza: Up to Date      10/12/2022    1:07 PM 04/25/2022    1:14 PM  03/30/2022    2:35 PM 12/18/2021    2:02 PM 04/17/2021    1:50 PM  Depression screen PHQ 2/9  Decreased Interest 0 0 0 0 0  Down, Depressed, Hopeless 0 0 0 0 0  PHQ - 2 Score 0 0 0 0 0  Altered sleeping 0 0 0 0   Tired, decreased energy 1 0 1 0   Change in appetite 0 0 0 0   Feeling bad or failure about yourself  0 0 0 0   Trouble concentrating 0 0 0 0   Moving slowly or fidgety/restless 0 0 0 0   Suicidal thoughts 0 0 0 0   PHQ-9 Score 1 0 1 0   Difficult doing work/chores Not difficult at all   Not difficult at all     Relevant past medical, surgical, family and social history reviewed and updated as indicated. Interim medical history since our last visit reviewed. Allergies and medications reviewed and updated.  Review of Systems  Constitutional:  Negative for activity change, diaphoresis, fatigue and fever.  Respiratory:   Negative for cough, chest tightness, shortness of breath and wheezing.   Cardiovascular:  Negative for chest pain, palpitations and leg swelling.  Gastrointestinal: Negative.   Endocrine: Negative for cold intolerance, heat intolerance, polydipsia, polyphagia and polyuria.  Neurological: Negative.   Psychiatric/Behavioral: Negative.      Per HPI unless specifically indicated above     Objective:    BP 122/72 (BP Location: Left Arm, Patient Position: Sitting, Cuff Size: Normal)   Pulse 69   Temp (!) 97.5 F (36.4 C) (Oral)   Ht 6\' 3"  (1.905 m)   Wt 206 lb 3.2 oz (93.5 kg)   SpO2 92%   BMI 25.77 kg/m   Wt Readings from Last 3 Encounters:  10/12/22 206 lb 3.2 oz (93.5 kg)  07/18/22 198 lb (89.8 kg)  03/30/22 201 lb 3.2 oz (91.3 kg)    Physical Exam Vitals and nursing note reviewed.  Constitutional:      General: He is awake. He is not in acute distress.    Appearance: He is well-developed and overweight. He is not ill-appearing.     Comments: Boot in place to right foot.  HENT:     Head: Normocephalic and atraumatic.     Right Ear: Hearing normal. No drainage.     Left Ear: Hearing normal. No drainage.  Eyes:     General: Lids are normal.        Right eye: No discharge.        Left eye: No discharge.     Conjunctiva/sclera: Conjunctivae normal.     Pupils: Pupils are equal, round, and reactive to light.  Neck:     Vascular: No carotid bruit.  Cardiovascular:     Rate and Rhythm: Normal rate and regular rhythm.     Heart sounds: Normal heart sounds, S1 normal and S2 normal. No murmur heard.    No gallop.  Pulmonary:     Effort: Pulmonary effort is normal. No accessory muscle usage or respiratory distress.     Breath sounds: Normal breath sounds.  Abdominal:     General: Bowel sounds are normal. There is no distension.     Palpations: Abdomen is soft.     Tenderness: There is no abdominal tenderness. There is no right CVA tenderness or left CVA tenderness.   Musculoskeletal:     Cervical back: Normal range of motion and neck supple.  Right lower leg: No edema.     Left lower leg: No edema.  Skin:    General: Skin is warm and dry.  Neurological:     Mental Status: He is alert and oriented to person, place, and time.  Psychiatric:        Attention and Perception: Attention normal.        Mood and Affect: Mood normal.        Speech: Speech normal.        Behavior: Behavior normal. Behavior is cooperative.    Results for orders placed or performed during the hospital encounter of 07/26/22  Basic Metabolic Panel (BMET)  Result Value Ref Range   Sodium 143 135 - 145 mmol/L   Potassium 3.4 (L) 3.5 - 5.1 mmol/L   Chloride 110 98 - 111 mmol/L   CO2 23 22 - 32 mmol/L   Glucose, Bld 144 (H) 70 - 99 mg/dL   BUN 23 8 - 23 mg/dL   Creatinine, Ser 1.61 0.61 - 1.24 mg/dL   Calcium 9.8 8.9 - 09.6 mg/dL   GFR, Estimated >04 >54 mL/min   Anion gap 10 5 - 15      Assessment & Plan:   Problem List Items Addressed This Visit       Cardiovascular and Mediastinum   Chronic heart failure with preserved ejection fraction (HFpEF) (HCC)    Chronic, stable.  Euvolemic on exam today.  Continue collaboration with cardiology and current medication regimen as ordered by them.  On Farxiga for prevention.  Recommend: - Reminded to call for an overnight weight gain of >2 pounds or a weekly weight weight of >5 pounds - not adding salt to his food and has been reading food labels. Reviewed the importance of keeping daily sodium intake to 2000mg  daily  - Avoid Ibuprofen medications      Relevant Medications   atorvastatin (LIPITOR) 20 MG tablet   chlorthalidone (HYGROTON) 25 MG tablet   Other Relevant Orders   Comprehensive metabolic panel   Coronary artery disease    Chronic.  Continue collaboration with cardiology and medication regimen as ordered.  No recent NTG use.      Relevant Medications   atorvastatin (LIPITOR) 20 MG tablet    chlorthalidone (HYGROTON) 25 MG tablet   Hypertension associated with diabetes (HCC)    Chronic, stable.  BP at goal in office and on home readings.  Continue current medication regimen and adjust as needed.  Continue collaboration with cardiology.  Recommend he monitor BP at least a few times a week at home and document.  DASH diet focus.  LABS: Lipid and CMP today.  Return in 6 months.      Relevant Medications   metFORMIN (GLUCOPHAGE) 1000 MG tablet   atorvastatin (LIPITOR) 20 MG tablet   chlorthalidone (HYGROTON) 25 MG tablet   dapagliflozin propanediol (FARXIGA) 10 MG TABS tablet   Other Relevant Orders   Bayer DCA Hb A1c Waived   Comprehensive metabolic panel   TSH   Peripheral vascular disease (HCC)    Chronic, stable.  Continue statin and ASA daily at home for prevention + collaboration with podiatry.      Relevant Medications   atorvastatin (LIPITOR) 20 MG tablet   chlorthalidone (HYGROTON) 25 MG tablet   Other Relevant Orders   Comprehensive metabolic panel   Lipid Panel w/o Chol/HDL Ratio   Pulmonary hypertension (HCC)    Pulmonary artery enlargement noted on CT lung screening, ?pulmonary arterial hypertension.  At  this time continue collaboration with cardiology and current medication regimen.  Consider involvement of pulmonary in future.      Relevant Medications   atorvastatin (LIPITOR) 20 MG tablet   chlorthalidone (HYGROTON) 25 MG tablet     Respiratory   Centrilobular emphysema (HCC)    Chronic, stable with minimal use of Albuterol.  PFT in 2022 was FEV1 64% and FEV1/FVC 73%, remaining stable.  Continue to monitor and adjust regimen as needed.  Continue Albuterol and recommend he use this if wheezing present or SOB.  Continue cessation of smoking and continue yearly lung screening.  Spirometry annually -- obtain next visit.        Digestive   Hepatic steatosis    Chronic.  Noted on lung screening since July 2021 -- recommend focus on healthy diet and will  continue to monitor.        Endocrine   Controlled type 2 diabetes with neuropathy (HCC) - Primary    Chronic, ongoing.  A1c 6.5%, remaining stable. Urine ALB 10 and A:C <29 March 2022. Continue current medication regimen and checking BS at home daily.  Continue Metformin and Farxiga as ordered.  Will not change medications today, recommend heavy focus on diet with less pasta and potatoes.  Continue Gabapentin as prescribed by podiatry and adjust as needed based on renal function.  Return in 6 months for follow-up.         Relevant Medications   metFORMIN (GLUCOPHAGE) 1000 MG tablet   atorvastatin (LIPITOR) 20 MG tablet   dapagliflozin propanediol (FARXIGA) 10 MG TABS tablet   Other Relevant Orders   Bayer DCA Hb A1c Waived   Hyperlipidemia associated with type 2 diabetes mellitus (HCC)    Chronic, ongoing.  Continue current medication regimen and adjust as needed.  Lipid panel today.  Recent LDL <70.      Relevant Medications   metFORMIN (GLUCOPHAGE) 1000 MG tablet   atorvastatin (LIPITOR) 20 MG tablet   chlorthalidone (HYGROTON) 25 MG tablet   dapagliflozin propanediol (FARXIGA) 10 MG TABS tablet   Other Relevant Orders   Bayer DCA Hb A1c Waived   Comprehensive metabolic panel   Lipid Panel w/o Chol/HDL Ratio     Other   Adrenal nodule (HCC)    Stable on imaging.  Left side, noted on CT lung screening, stable on recent check = possible adenoma reported.  Continue to monitor on screenings and send to nephrology as needed.      Relevant Orders   Comprehensive metabolic panel   Other Visit Diagnoses     Flu vaccine need       Flu vaccine in office today,   Relevant Orders   Flu Vaccine QUAD High Dose(Fluad) (Completed)        Follow up plan: Return in about 6 months (around 04/13/2023) for T2DM, HTN/HLD, COPD, BPH, B12 -- needs spirometry.

## 2022-10-12 NOTE — Assessment & Plan Note (Signed)
Chronic, stable.  Continue statin and ASA daily at home for prevention + collaboration with podiatry.

## 2022-10-12 NOTE — Assessment & Plan Note (Addendum)
Chronic.  Noted on lung screening since July 2021 -- recommend focus on healthy diet and will continue to monitor.

## 2022-10-12 NOTE — Assessment & Plan Note (Signed)
Pulmonary artery enlargement noted on CT lung screening, ?pulmonary arterial hypertension.  At this time continue collaboration with cardiology and current medication regimen.  Consider involvement of pulmonary in future.

## 2022-10-12 NOTE — Assessment & Plan Note (Signed)
Chronic, stable.  Euvolemic on exam today.  Continue collaboration with cardiology and current medication regimen as ordered by them.  On Farxiga for prevention.  Recommend: - Reminded to call for an overnight weight gain of >2 pounds or a weekly weight weight of >5 pounds - not adding salt to his food and has been reading food labels. Reviewed the importance of keeping daily sodium intake to 2000mg  daily  - Avoid Ibuprofen medications

## 2022-10-13 LAB — COMPREHENSIVE METABOLIC PANEL
ALT: 24 IU/L (ref 0–44)
AST: 18 IU/L (ref 0–40)
Albumin/Globulin Ratio: 2.6 — ABNORMAL HIGH (ref 1.2–2.2)
Albumin: 4.9 g/dL — ABNORMAL HIGH (ref 3.8–4.8)
Alkaline Phosphatase: 60 IU/L (ref 44–121)
BUN/Creatinine Ratio: 14 (ref 10–24)
BUN: 17 mg/dL (ref 8–27)
Bilirubin Total: 0.3 mg/dL (ref 0.0–1.2)
CO2: 22 mmol/L (ref 20–29)
Calcium: 10.1 mg/dL (ref 8.6–10.2)
Chloride: 102 mmol/L (ref 96–106)
Creatinine, Ser: 1.21 mg/dL (ref 0.76–1.27)
Globulin, Total: 1.9 g/dL (ref 1.5–4.5)
Glucose: 89 mg/dL (ref 70–99)
Potassium: 3.9 mmol/L (ref 3.5–5.2)
Sodium: 142 mmol/L (ref 134–144)
Total Protein: 6.8 g/dL (ref 6.0–8.5)
eGFR: 64 mL/min/{1.73_m2} (ref >59)

## 2022-10-13 LAB — LIPID PANEL W/O CHOL/HDL RATIO
Cholesterol, Total: 102 mg/dL (ref 100–199)
HDL: 28 mg/dL — ABNORMAL LOW (ref >39)
LDL Chol Calc (NIH): 46 mg/dL (ref 0–99)
Triglycerides: 169 mg/dL — ABNORMAL HIGH (ref 0–149)
VLDL Cholesterol Cal: 28 mg/dL (ref 5–40)

## 2022-10-13 LAB — TSH: TSH: 1.1 u[IU]/mL (ref 0.450–4.500)

## 2022-10-13 NOTE — Progress Notes (Signed)
Good morning, please let Gabrian know his labs returned and everything remains stable.  No changes to medication needed.  Any questions? Keep being stellar!!  Thank you for allowing me to participate in your care.  I appreciate you. Kindest regards, Ardon Franklin

## 2022-10-17 DIAGNOSIS — M79671 Pain in right foot: Secondary | ICD-10-CM | POA: Diagnosis not present

## 2022-10-17 DIAGNOSIS — S92411D Displaced fracture of proximal phalanx of right great toe, subsequent encounter for fracture with routine healing: Secondary | ICD-10-CM | POA: Diagnosis not present

## 2022-10-30 ENCOUNTER — Encounter: Payer: Self-pay | Admitting: Podiatry

## 2022-11-07 ENCOUNTER — Encounter: Payer: Self-pay | Admitting: Nurse Practitioner

## 2022-11-07 ENCOUNTER — Ambulatory Visit (INDEPENDENT_AMBULATORY_CARE_PROVIDER_SITE_OTHER): Payer: Medicare HMO | Admitting: Nurse Practitioner

## 2022-11-07 VITALS — BP 124/78 | HR 69 | Temp 97.8°F | Ht 75.0 in | Wt 201.8 lb

## 2022-11-07 DIAGNOSIS — G8929 Other chronic pain: Secondary | ICD-10-CM | POA: Diagnosis not present

## 2022-11-07 DIAGNOSIS — R5383 Other fatigue: Secondary | ICD-10-CM

## 2022-11-07 DIAGNOSIS — M25511 Pain in right shoulder: Secondary | ICD-10-CM | POA: Diagnosis not present

## 2022-11-07 MED ORDER — LIDOCAINE 5 % EX PTCH: 1.0000 | MEDICATED_PATCH | CUTANEOUS | 0 refills | Status: AC

## 2022-11-07 MED ORDER — HYDROCODONE-ACETAMINOPHEN 5-325 MG PO TABS: 1.0000 | ORAL_TABLET | Freq: Four times a day (QID) | ORAL | 0 refills | Status: AC | PRN

## 2022-11-07 NOTE — Assessment & Plan Note (Signed)
Ongoing for 3 weeks, since shoulder began hurting more.  Discussed with him, will see if improvement in pain improves fatigue level.  EKG in office today comparable to previous EKGs  with NSR and occasional PAC.  If ongoing fatigue will obtain labs, recent labs stable.

## 2022-11-07 NOTE — Progress Notes (Signed)
BP 124/78   Pulse 69   Temp 97.8 F (36.6 C) (Oral)   Ht _0  (1.905 m)   Wt 201 lb 12.8 oz (91.5 kg)   SpO2 91%   BMI 25.22 kg/m    Subjective:    Patient ID: Brendan [name], male    DOB: 05/18/1951, 71 y.o.   MRN: [id number]  HPI: Brendan [name] is a 71 y.o. male  Chief Complaint  Patient presents with   Shoulder Pain    Patient says he has been having issues with his R shoulder for a while and every now and again. Patient says he has a flare up. Patient says it is a certain spot he notices pain and discomfort and he cannot raise it any further. Patient says he has tried cream but it did not help.    Fatigue    Patient says he is notices he more tired recently. Patient says he seems to be as tired as he is went he goes to bed as he is when he wakes up.    SHOULDER PAIN Chronic issue for several years with acute flare present for 2-3 weeks.  Right shoulder, is right hand dominant.  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  Duration: chronic with acute flare Involved shoulder: right Mechanism of injury: unknown Location: anterior and posterior Onset:gradual Severity: 8/10  Quality:  sharp and aching Frequency: intermittent Radiation:  occasional nerve pain down to hand Aggravating factors: lifting and movement  Alleviating factors: APAP and rest  Status: fluctuating Treatments attempted: heat, APAP, hemp cream  Relief with NSAIDs?:  No NSAIDs Taken Weakness: no Numbness: no Decreased grip strength: no Redness: no Swelling: no Bruising: no Fevers: no   FATIGUE Recently has been more tired for the past 3 weeks.  When he gets up in morning is tired, sometimes feels better and sometimes does not. Does not want to go for sleep study.  Taking B12 one every other day. Duration:  weeks Severity: mild  Onset: sudden Context when symptoms started:  none Symptoms improve with rest: no  Depressive symptoms: no Stress/anxiety: no Insomnia: no  Snoring:  unknown Observed  apnea by bed partner: unknown Daytime hypersomnolence: occasionally Wakes feeling refreshed: no History of sleep study: no Dysnea on exertion:  no Orthopnea/PND: no -- sleeps on one pillow at night per baseline Chest pain: no Chronic cough: no Lower extremity edema: no Arthralgias:no Myalgias: no Weakness: no Rash: no     11/07/2022    1:52 PM 10/12/2022    1:07 PM 04/25/2022    1:14 PM 03/30/2022    2:35 PM 12/18/2021    2:02 PM  Depression screen PHQ 2/9  Decreased Interest 0 0 0 0 0  Down, Depressed, Hopeless 0 0 0 0 0  PHQ - 2 Score 0 0 0 0 0  Altered sleeping 0 0 0 0 0  Tired, decreased energy 1 1 0 1 0  Change in appetite 0 0 0 0 0  Feeling bad or failure about yourself  0 0 0 0 0  Trouble concentrating 0 0 0 0 0  Moving slowly or fidgety/restless 0 0 0 0 0  Suicidal thoughts 0 0 0 0 0  PHQ-9 Score 1 1 0 1 0  Difficult doing work/chores Not difficult at all Not difficult at all   Not difficult at all       11/07/2022    1:52 PM 10/12/2022    1:08 PM 03/30/2022  2:35 PM 12/18/2021    2:03 PM  GAD 7 : Generalized Anxiety Score  Nervous, Anxious, on Edge 0 0 0 0  Control/stop worrying 0 0 1 0  Worry too much - different things 0 1 1 0  Trouble relaxing 0 0 0 0  Restless 0 0 0 0  Easily annoyed or irritable 0 0 0 0  Afraid - awful might happen 0 0 0 0  Total GAD 7 Score 0 1 2 0  Anxiety Difficulty Not difficult at all Not difficult at all Not difficult at all Not difficult at all    Relevant past medical, surgical, family and social history reviewed and updated as indicated. Interim medical history since our last visit reviewed. Allergies and medications reviewed and updated.  Review of Systems  Constitutional:  Positive for fatigue. Negative for activity change, diaphoresis and fever.  Respiratory:  Negative for cough, chest tightness, shortness of breath and wheezing.   Cardiovascular:  Negative for chest pain, palpitations and leg swelling.   Gastrointestinal: Negative.   Endocrine: Negative for cold intolerance, heat intolerance, polydipsia, polyphagia and polyuria.  Musculoskeletal:  Positive for arthralgias.  Neurological: Negative.   Psychiatric/Behavioral: Negative.     Per HPI unless specifically indicated above     Objective:    BP 124/78   Pulse 69   Temp 97.8 F (36.6 C) (Oral)   Ht _0  (1.905 m)   Wt 201 lb 12.8 oz (91.5 kg)   SpO2 91%   BMI 25.22 kg/m   Wt Readings from Last 3 Encounters:  11/07/22 201 lb 12.8 oz (91.5 kg)  10/12/22 206 lb 3.2 oz (93.5 kg)  07/18/22 198 lb (89.8 kg)    Physical Exam Vitals and nursing note reviewed.  Constitutional:      General: He is awake. He is not in acute distress.    Appearance: He is well-developed and overweight. He is not ill-appearing.  HENT:     Head: Normocephalic and atraumatic.     Right Ear: Hearing normal. No drainage.     Left Ear: Hearing normal. No drainage.  Eyes:     General: Lids are normal.        Right eye: No discharge.        Left eye: No discharge.     Conjunctiva/sclera: Conjunctivae normal.     Pupils: Pupils are equal, round, and reactive to light.  Neck:     Vascular: No carotid bruit.  Cardiovascular:     Rate and Rhythm: Normal rate. Rhythm irregular.     Heart sounds: Normal heart sounds, S1 normal and S2 normal. No murmur heard.    No gallop.  Pulmonary:     Effort: Pulmonary effort is normal. No accessory muscle usage or respiratory distress.     Breath sounds: Normal breath sounds.  Abdominal:     General: Bowel sounds are normal. There is no distension.     Palpations: Abdomen is soft.     Tenderness: There is no abdominal tenderness. There is no right CVA tenderness or left CVA tenderness.  Musculoskeletal:     Right shoulder: Tenderness present. No swelling, effusion, laceration, bony tenderness or crepitus. Decreased range of motion. Decreased strength.     Left shoulder: Normal.     Cervical back: Normal  range of motion and neck supple.     Right lower leg: No edema.     Left lower leg: No edema.     Comments: No rashes noted  Skin:    General: Skin is warm and dry.  Neurological:     Mental Status: He is alert and oriented to person, place, and time.  Psychiatric:        Attention and Perception: Attention normal.        Mood and Affect: Mood normal.        Speech: Speech normal.        Behavior: Behavior normal. Behavior is cooperative.      Shoulder: bilateral    Inspection:  no swelling, ecchymosis, erythema or step off deformity.     Tenderness to Palpation:    Acromion: no    AC joint:no    Clavicle: no    Bicipital groove: yes right side    Scapular spine: no    Coracoid process: no    Humeral head: no    Supraspinatus tendon: no     Range of Motion:     Abduction:Decreased right    Adduction: Decreased right    Flexion: Normal    Extension: Normal    Internal rotation: Decreased right    External rotation: Decreased right    Painful arc: no     Muscle Strength: decreased on all ROM right and normal left     Neuro: Sensation WNL. and Upper extremity reflexes WNL.     Special Tests:     Neer sign: Positive    Hawkins sign: Positive    Cross arm adduction: Positive    Yergason sign: Positive    O'brien sign: Positive   EKG My review and personal interpretation at Time: 1400  Indication: abnormal HR  Rate: 78  Rhythm: sinus Axis: normal Other: no nonspecific st abn, no stemi, no lvh == PAC noted  Results for orders placed or performed in visit on 10/12/22  Bayer DCA Hb A1c Waived  Result Value Ref Range   HB A1C (BAYER DCA - WAIVED) 6.5 (H) 4.8 - 5.6 %  Comprehensive metabolic panel  Result Value Ref Range   Glucose 89 70 - 99 mg/dL   BUN 17 8 - 27 mg/dL   Creatinine, Ser 1.21 0.76 - 1.27 mg/dL   eGFR 64 >59 mL/min/1.73   BUN/Creatinine Ratio 14 10 - 24   Sodium 142 134 - 144 mmol/L   Potassium 3.9 3.5 - 5.2 mmol/L   Chloride 102 96 - 106 mmol/L    CO2 22 20 - 29 mmol/L   Calcium 10.1 8.6 - 10.2 mg/dL   Total Protein 6.8 6.0 - 8.5 g/dL   Albumin 4.9 (H) 3.8 - 4.8 g/dL   Globulin, Total 1.9 1.5 - 4.5 g/dL   Albumin/Globulin Ratio 2.6 (H) 1.2 - 2.2   Bilirubin Total 0.3 0.0 - 1.2 mg/dL   Alkaline Phosphatase 60 44 - 121 IU/L   AST 18 0 - 40 IU/L   ALT 24 0 - 44 IU/L  Lipid Panel w/o Chol/HDL Ratio  Result Value Ref Range   Cholesterol, Total 102 100 - 199 mg/dL   Triglycerides 169 (H) 0 - 149 mg/dL   HDL 28 (L) >39 mg/dL   VLDL Cholesterol Cal 28 5 - 40 mg/dL   LDL Chol Calc (NIH) 46 0 - 99 mg/dL  TSH  Result Value Ref Range   TSH 1.100 0.450 - 4.500 uIU/mL      Assessment & Plan:   Problem List Items Addressed This Visit       Other   Chronic right shoulder pain - Primary  Chronic issue with current acute flare, worsening.  Right side with decreased strength and ROM.  Due to ongoing issues with current worsening will place referral to ortho for further.  Continue at home pain regimen, Tylenol and OTC creams.  Send in short (5 day) supply of Norco to help with night time discomfort and rest, suspect some of fatigue currently from pain.  Lidocaine patches as needed.  Discussed plan of care with patient.      Relevant Medications   HYDROcodone-acetaminophen (NORCO) 5-325 MG tablet   Other Relevant Orders   Ambulatory referral to Orthopedics   Fatigue    Ongoing for 3 weeks, since shoulder began hurting more.  Discussed with him, will see if improvement in pain improves fatigue level.  EKG in office today comparable to previous EKGs  with NSR and occasional PAC.  If ongoing fatigue will obtain labs, recent labs stable.      Relevant Orders   EKG 12-Lead (Completed)     Follow up plan: Return in about 6 weeks (around 12/19/2022) for Shoulder pain.

## 2022-11-07 NOTE — Patient Instructions (Signed)

## 2022-11-07 NOTE — Assessment & Plan Note (Signed)
Chronic issue with current acute flare, worsening.  Right side with decreased strength and ROM.  Due to ongoing issues with current worsening will place referral to ortho for further.  Continue at home pain regimen, Tylenol and OTC creams.  Send in short (5 day) supply of Norco to help with night time discomfort and rest, suspect some of fatigue currently from pain.  Lidocaine patches as needed.  Discussed plan of care with patient.

## 2022-11-19 DIAGNOSIS — E119 Type 2 diabetes mellitus without complications: Secondary | ICD-10-CM | POA: Diagnosis not present

## 2022-11-19 DIAGNOSIS — S92411D Displaced fracture of proximal phalanx of right great toe, subsequent encounter for fracture with routine healing: Secondary | ICD-10-CM | POA: Diagnosis not present

## 2022-11-19 DIAGNOSIS — M79671 Pain in right foot: Secondary | ICD-10-CM | POA: Diagnosis not present

## 2022-12-16 NOTE — Patient Instructions (Incomplete)

## 2022-12-19 ENCOUNTER — Ambulatory Visit (INDEPENDENT_AMBULATORY_CARE_PROVIDER_SITE_OTHER): Payer: Medicare HMO | Admitting: Nurse Practitioner

## 2022-12-19 ENCOUNTER — Encounter: Payer: Self-pay | Admitting: Nurse Practitioner

## 2022-12-19 VITALS — BP 122/80 | HR 70 | Temp 97.7°F | Ht 75.0 in | Wt 203.6 lb

## 2022-12-19 DIAGNOSIS — M25511 Pain in right shoulder: Secondary | ICD-10-CM

## 2022-12-19 DIAGNOSIS — G8929 Other chronic pain: Secondary | ICD-10-CM | POA: Diagnosis not present

## 2022-12-19 NOTE — Assessment & Plan Note (Signed)
Chronic issue with minimal improvement, has not heard from ortho to schedule.  Provided him # for [facility] today to call.  Right side with decreased strength and ROM. Continue at home pain regimen, Tylenol and OTC creams.  Lidocaine patches as needed.  Discussed plan of care with patient.

## 2022-12-19 NOTE — Progress Notes (Signed)
BP 122/80   Pulse 70   Temp 97.7 F (36.5 C) (Oral)   Ht _0  (1.905 m)   Wt 203 lb 9.6 oz (92.4 kg)   SpO2 94%   BMI 25.45 kg/m    Subjective:    Patient ID: Brendan [name], male    DOB: 10-Jun-1951, 71 y.o.   MRN: [id number]  HPI: Brendan [name] is a 71 y.o. male  Chief Complaint  Patient presents with   Shoulder Pain    Right chronic shoulder pain f/u, Patient states that it is feeling better.   SHOULDER PAIN Follow-up today for shoulder pain, was provided Norco and Lidocaine patches on 11/07/22 which took the edge off some.  Chronic issue for several years with recent acute flare.  Right shoulder, is right hand dominant.  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 A referral was placed to ortho last visit, but he has not heard from them. Duration: chronic with acute flare Involved shoulder: right Mechanism of injury: unknown Location: anterior and posterior Onset:gradual Severity: no pain today -- when present is 8/10 Quality:  sharp and aching Frequency: intermittent Radiation:  occasional nerve pain down to hand Aggravating factors: lifting and movement  Alleviating factors: APAP and rest , Norco, Lidocaine patches Status: fluctuating Treatments attempted: heat, APAP, hemp cream, Norco, Lidocaine patches Relief with NSAIDs?:  No NSAIDs Taken Weakness: no Numbness: no Decreased grip strength: no Redness: no Swelling: no Bruising: no Fevers: no   Relevant past medical, surgical, family and social history reviewed and updated as indicated. Interim medical history since our last visit reviewed. Allergies and medications reviewed and updated.  Review of Systems  Constitutional:  Negative for activity change, diaphoresis, fatigue and fever.  Respiratory:  Negative for cough, chest tightness, shortness of breath and wheezing.   Cardiovascular:  Negative for chest pain, palpitations and leg swelling.  Gastrointestinal: Negative.   Endocrine: Negative for cold  intolerance, heat intolerance, polydipsia, polyphagia and polyuria.  Musculoskeletal:  Positive for arthralgias.  Neurological: Negative.   Psychiatric/Behavioral: Negative.     Per HPI unless specifically indicated above     Objective:    BP 122/80   Pulse 70   Temp 97.7 F (36.5 C) (Oral)   Ht _1  (1.905 m)   Wt 203 lb 9.6 oz (92.4 kg)   SpO2 94%   BMI 25.45 kg/m   Wt Readings from Last 3 Encounters:  12/19/22 203 lb 9.6 oz (92.4 kg)  11/07/22 201 lb 12.8 oz (91.5 kg)  10/12/22 206 lb 3.2 oz (93.5 kg)    Physical Exam Vitals and nursing note reviewed.  Constitutional:      General: He is awake. He is not in acute distress.    Appearance: He is well-developed and overweight. He is not ill-appearing.  HENT:     Head: Normocephalic and atraumatic.     Right Ear: Hearing normal. No drainage.     Left Ear: Hearing normal. No drainage.  Eyes:     General: Lids are normal.        Right eye: No discharge.        Left eye: No discharge.     Conjunctiva/sclera: Conjunctivae normal.     Pupils: Pupils are equal, round, and reactive to light.  Neck:     Vascular: No carotid bruit.  Cardiovascular:     Rate and Rhythm: Normal rate and regular rhythm.     Heart sounds: Normal heart sounds, S1 normal and  S2 normal. No murmur heard.    No gallop.  Pulmonary:     Effort: Pulmonary effort is normal. No accessory muscle usage or respiratory distress.     Breath sounds: Normal breath sounds.  Abdominal:     General: Bowel sounds are normal. There is no distension.     Palpations: Abdomen is soft.     Tenderness: There is no abdominal tenderness. There is no right CVA tenderness or left CVA tenderness.  Musculoskeletal:     Right shoulder: Tenderness present. No swelling, effusion, laceration, bony tenderness or crepitus. Decreased range of motion. Decreased strength.     Left shoulder: Normal.     Cervical back: Normal range of motion and neck supple.     Right lower leg: No  edema.     Left lower leg: No edema.     Comments: No rashes noted  Skin:    General: Skin is warm and dry.  Neurological:     Mental Status: He is alert and oriented to person, place, and time.  Psychiatric:        Attention and Perception: Attention normal.        Mood and Affect: Mood normal.        Speech: Speech normal.        Behavior: Behavior normal. Behavior is cooperative.      Shoulder: bilateral    Inspection:  no swelling, ecchymosis, erythema or step off deformity.     Tenderness to Palpation:    Acromion: no    AC joint:no    Clavicle: no    Bicipital groove: yes right side    Scapular spine: no    Coracoid process: no    Humeral head: no    Supraspinatus tendon: no     Range of Motion:     Abduction:Decreased right    Adduction: Decreased right    Flexion: Normal    Extension: Normal    Internal rotation: Decreased right    External rotation: Decreased right    Painful arc: no     Muscle Strength: decreased on all ROM right and normal left     Neuro: Sensation WNL. and Upper extremity reflexes WNL.     Special Tests:     Neer sign: Positive    Hawkins sign: Positive    Cross arm adduction: Positive    Yergason sign: Positive    O'brien sign: Positive   Results for orders placed or performed in visit on 10/12/22  Bayer DCA Hb A1c Waived  Result Value Ref Range   HB A1C (BAYER DCA - WAIVED) 6.5 (H) 4.8 - 5.6 %  Comprehensive metabolic panel  Result Value Ref Range   Glucose 89 70 - 99 mg/dL   BUN 17 8 - 27 mg/dL   Creatinine, Ser 1.21 0.76 - 1.27 mg/dL   eGFR 64 >59 mL/min/1.73   BUN/Creatinine Ratio 14 10 - 24   Sodium 142 134 - 144 mmol/L   Potassium 3.9 3.5 - 5.2 mmol/L   Chloride 102 96 - 106 mmol/L   CO2 22 20 - 29 mmol/L   Calcium 10.1 8.6 - 10.2 mg/dL   Total Protein 6.8 6.0 - 8.5 g/dL   Albumin 4.9 (H) 3.8 - 4.8 g/dL   Globulin, Total 1.9 1.5 - 4.5 g/dL   Albumin/Globulin Ratio 2.6 (H) 1.2 - 2.2   Bilirubin Total 0.3 0.0 - 1.2  mg/dL   Alkaline Phosphatase 60 44 - 121 IU/L   AST 18  0 - 40 IU/L   ALT 24 0 - 44 IU/L  Lipid Panel w/o Chol/HDL Ratio  Result Value Ref Range   Cholesterol, Total 102 100 - 199 mg/dL   Triglycerides 169 (H) 0 - 149 mg/dL   HDL 28 (L) >39 mg/dL   VLDL Cholesterol Cal 28 5 - 40 mg/dL   LDL Chol Calc (NIH) 46 0 - 99 mg/dL  TSH  Result Value Ref Range   TSH 1.100 0.450 - 4.500 uIU/mL      Assessment & Plan:   Problem List Items Addressed This Visit       Other   Chronic right shoulder pain - Primary    Chronic issue with minimal improvement, has not heard from ortho to schedule.  Provided him # for [facility] today to call.  Right side with decreased strength and ROM. Continue at home pain regimen, Tylenol and OTC creams.  Lidocaine patches as needed.  Discussed plan of care with patient.        Follow up plan: Return for as scheduled on 04/15/23.

## 2023-01-04 DIAGNOSIS — E119 Type 2 diabetes mellitus without complications: Secondary | ICD-10-CM | POA: Diagnosis not present

## 2023-01-04 DIAGNOSIS — H35033 Hypertensive retinopathy, bilateral: Secondary | ICD-10-CM | POA: Diagnosis not present

## 2023-01-04 DIAGNOSIS — H26493 Other secondary cataract, bilateral: Secondary | ICD-10-CM | POA: Diagnosis not present

## 2023-01-04 DIAGNOSIS — H1045 Other chronic allergic conjunctivitis: Secondary | ICD-10-CM | POA: Diagnosis not present

## 2023-01-04 LAB — HM DIABETES EYE EXAM

## 2023-01-07 DIAGNOSIS — M7541 Impingement syndrome of right shoulder: Secondary | ICD-10-CM | POA: Diagnosis not present

## 2023-01-14 DIAGNOSIS — I10 Essential (primary) hypertension: Secondary | ICD-10-CM | POA: Diagnosis not present

## 2023-01-14 DIAGNOSIS — E119 Type 2 diabetes mellitus without complications: Secondary | ICD-10-CM | POA: Diagnosis not present

## 2023-01-14 DIAGNOSIS — H26492 Other secondary cataract, left eye: Secondary | ICD-10-CM | POA: Diagnosis not present

## 2023-01-21 DIAGNOSIS — H26492 Other secondary cataract, left eye: Secondary | ICD-10-CM | POA: Diagnosis not present

## 2023-02-11 DIAGNOSIS — H26491 Other secondary cataract, right eye: Secondary | ICD-10-CM | POA: Diagnosis not present

## 2023-02-18 DIAGNOSIS — Z0100 Encounter for examination of eyes and vision without abnormal findings: Secondary | ICD-10-CM | POA: Diagnosis not present

## 2023-02-18 DIAGNOSIS — H26491 Other secondary cataract, right eye: Secondary | ICD-10-CM | POA: Diagnosis not present

## 2023-02-19 ENCOUNTER — Ambulatory Visit: Payer: Self-pay | Admitting: *Deleted

## 2023-02-19 NOTE — Telephone Encounter (Signed)
Summary: congestion/cough   Patient states that he has had chest congestion and a productive cough for 4 days. Patient states he has also had a slight headache on and off. Offered patient same day appointment and he refused because the office is temporarily in [place]. Also offered a virtual appointment and patient refused.         Reason for Disposition  [1] Continuous (nonstop) coughing interferes with work or school AND [2] no improvement using cough treatment per Care Advice  Answer Assessment - Initial Assessment Questions 1. ONSET: "When did the cough begin?"      4 days ago 2. SEVERITY: "How bad is the cough today?"      Phelm present, patient is up some at night 3. SPUTUM: "Describe the color of your sputum" (none, dry cough; clear, white, yellow, green)     clear 4. HEMOPTYSIS: "Are you coughing up any blood?" If so ask: "How much?" (flecks, streaks, tablespoons, etc.)     no 5. DIFFICULTY BREATHING: "Are you having difficulty breathing?" If Yes, ask: "How bad is it?" (e.g., mild, moderate, severe)    - MILD: No SOB at rest, mild SOB with walking, speaks normally in sentences, can lie down, no retractions, pulse < 100.    - MODERATE: SOB at rest, SOB with minimal exertion and prefers to sit, cannot lie down flat, speaks in phrases, mild retractions, audible wheezing, pulse 100-120.    - SEVERE: Very SOB at rest, speaks in single words, struggling to breathe, sitting hunched forward, retractions, pulse > 120      Good- no SOB 6. FEVER: "Do you have a fever?" If Yes, ask: "What is your temperature, how was it measured, and when did it start?"     Sweating- not checked  10. OTHER SYMPTOMS: "Do you have any other symptoms?" (e.g., runny nose, wheezing, chest pain)       Headache- sometimes, chest congestion  Protocols used: Cough - Acute Productive-A-AH

## 2023-02-19 NOTE — Telephone Encounter (Signed)
  Chief Complaint: cough- chest congestion Symptoms: productive cough- chest congestion, possible fever, headache- off/on Frequency: 4 days Pertinent Negatives: Patient denies SOB Disposition: []$ ED /[]$ Urgent Care (no appt availability in office) / []$ Appointment(In office/virtual)/ []$  [place] [facility]/ []$ Home Care/ [x]$ Refused Recommended Disposition /[]$ [facility]/ []$  Follow-up with PCP Additional Notes: Patient states he can not come to office- he does not have transportation to [place]. Patient is unable to do virtual visit- he does have smart phone- but is unsure how to use for virtual visit. Patient is requesting medication ( he is presently taking Coricidin) be sent to [facility]. Patient advised needs COVID testing for symptoms- he does not have funds for that.

## 2023-02-19 NOTE — Telephone Encounter (Signed)
Called the pt to get scheduled unable to lmom due to the voicemail has yet to be setup.  Will call back later.

## 2023-02-27 NOTE — Telephone Encounter (Signed)
Called the pt unable to get him on the line and his voice mail box has not been set-up. Closing out the message due to the pt has been contacted several times and he doesn't have a voice mail box that is set-up.

## 2023-04-03 ENCOUNTER — Ambulatory Visit: Payer: Medicare HMO | Admitting: Podiatry

## 2023-04-08 ENCOUNTER — Ambulatory Visit: Payer: Medicare HMO | Admitting: Podiatry

## 2023-04-11 ENCOUNTER — Telehealth: Payer: Self-pay | Admitting: Nurse Practitioner

## 2023-04-11 NOTE — Telephone Encounter (Signed)
Copied from CRM [id number]. Topic: Medicare AWV >> Apr 11, 2023  9:17 AM Payton Doughty wrote: Reason for CRM: Called patient to schedule Medicare Annual Wellness Visit (AWV). No voicemail available to leave a message.  Last date of AWV: 04/25/22  Please schedule an appointment at any time with Kennedy Bucker, LPN  .  If any questions, please contact me.  Thank you ,  Verlee Rossetti; Care Guide Ambulatory Clinical Support [place] [facility] Direct Dial: [phone]

## 2023-04-13 NOTE — Patient Instructions (Signed)
Diabetes Mellitus Basics  Diabetes mellitus, or diabetes, is a long-term (chronic) disease. It occurs when the body does not properly use sugar (glucose) that is released from food after you eat. Diabetes mellitus may be caused by one or both of these problems: Your pancreas does not make enough of a hormone called insulin. Your body does not react in a normal way to the insulin that it makes. Insulin lets glucose enter cells in your body. This gives you energy. If you have diabetes, glucose cannot get into cells. This causes high blood glucose (hyperglycemia). How to treat and manage diabetes You may need to take insulin or other diabetes medicines daily to keep your glucose in balance. If you are prescribed insulin, you will learn how to give yourself insulin by injection. You may need to adjust the amount of insulin you take based on the foods that you eat. You will need to check your blood glucose levels using a glucose monitor as told by your health care provider. The readings can help determine if you have low or high blood glucose. Generally, you should have these blood glucose levels: Before meals (preprandial): 80-130 mg/dL (4.4-7.2 mmol/L). After meals (postprandial): below 180 mg/dL (10 mmol/L). Hemoglobin A1c (HbA1c) level: less than 7%. Your health care provider will set treatment goals for you. Keep all follow-up visits. This is important. Follow these instructions at home: Diabetes medicines Take your diabetes medicines every day as told by your health care provider. List your diabetes medicines here: Name of medicine: ______________________________ Amount (dose): _______________ Time (a.m./p.m.): _______________ Notes: ___________________________________ Name of medicine: ______________________________ Amount (dose): _______________ Time (a.m./p.m.): _______________ Notes: ___________________________________ Name of medicine: ______________________________ Amount (dose):  _______________ Time (a.m./p.m.): _______________ Notes: ___________________________________ Insulin If you use insulin, list the types of insulin you use here: Insulin type: ______________________________ Amount (dose): _______________ Time (a.m./p.m.): _______________Notes: ___________________________________ Insulin type: ______________________________ Amount (dose): _______________ Time (a.m./p.m.): _______________ Notes: ___________________________________ Insulin type: ______________________________ Amount (dose): _______________ Time (a.m./p.m.): _______________ Notes: ___________________________________ Insulin type: ______________________________ Amount (dose): _______________ Time (a.m./p.m.): _______________ Notes: ___________________________________ Insulin type: ______________________________ Amount (dose): _______________ Time (a.m./p.m.): _______________ Notes: ___________________________________ Managing blood glucose  Check your blood glucose levels using a glucose monitor as told by your health care provider. Write down the times that you check your glucose levels here: Time: _______________ Notes: ___________________________________ Time: _______________ Notes: ___________________________________ Time: _______________ Notes: ___________________________________ Time: _______________ Notes: ___________________________________ Time: _______________ Notes: ___________________________________ Time: _______________ Notes: ___________________________________  Low blood glucose Low blood glucose (hypoglycemia) is when glucose is at or below 70 mg/dL (3.9 mmol/L). Symptoms may include: Feeling: Hungry. Sweaty and clammy. Irritable or easily upset. Dizzy. Sleepy. Having: A fast heartbeat. A headache. A change in your vision. Numbness around the mouth, lips, or tongue. Having trouble with: Moving (coordination). Sleeping. Treating low blood glucose To treat low blood  glucose, eat or drink something containing sugar right away. If you can think clearly and swallow safely, follow the 15:15 rule: Take 15 grams of a fast-acting carb (carbohydrate), as told by your health care provider. Some fast-acting carbs are: Glucose tablets: take 3-4 tablets. Hard candy: eat 3-5 pieces. Fruit juice: drink 4 oz (120 mL). Regular (not diet) soda: drink 4-6 oz (120-180 mL). Honey or sugar: eat 1 Tbsp (15 mL). Check your blood glucose levels 15 minutes after you take the carb. If your glucose is still at or below 70 mg/dL (3.9 mmol/L), take 15 grams of a carb again. If your glucose does not go above 70 mg/dL (3.9 mmol/L) after   3 tries, get help right away. After your glucose goes back to normal, eat a meal or a snack within 1 hour. Treating very low blood glucose If your glucose is at or below 54 mg/dL (3 mmol/L), you have very low blood glucose (severe hypoglycemia). This is an emergency. Do not wait to see if the symptoms will go away. Get medical help right away. Call your local emergency services (911 in the [place] Do not drive yourself to the hospital. Questions to ask your health care provider Should I talk with a diabetes educator? What equipment will I need to care for myself at home? What diabetes medicines do I need? When should I take them? How often do I need to check my blood glucose levels? What number can I call if I have questions? When is my follow-up visit? Where can I find a support group for people with diabetes? Where to find more information American Diabetes Association: www.diabetes.org Association of Diabetes Care and Education Specialists: www.diabeteseducator.org Contact a health care provider if: Your blood glucose is at or above 240 mg/dL (13.3 mmol/L) for 2 days in a row. You have been sick or have had a fever for 2 days or more, and you are not getting better. You have any of these problems for more than 6 hours: You cannot eat or  drink. You feel nauseous. You vomit. You have diarrhea. Get help right away if: Your blood glucose is lower than 54 mg/dL (3 mmol/L). You get confused. You have trouble thinking clearly. You have trouble breathing. These symptoms may represent a serious problem that is an emergency. Do not wait to see if the symptoms will go away. Get medical help right away. Call your local emergency services (911 in the [place] Do not drive yourself to the hospital. Summary Diabetes mellitus is a chronic disease that occurs when the body does not properly use sugar (glucose) that is released from food after you eat. Take insulin and diabetes medicines as told. Check your blood glucose every day, as often as told. Keep all follow-up visits. This is important. This information is not intended to replace advice given to you by your health care provider. Make sure you discuss any questions you have with your health care provider. Document Revised: 04/19/2020 Document Reviewed: 04/19/2020 Elsevier Patient Education  2023 Elsevier Inc.  

## 2023-04-15 ENCOUNTER — Encounter: Payer: Self-pay | Admitting: Nurse Practitioner

## 2023-04-15 ENCOUNTER — Ambulatory Visit (INDEPENDENT_AMBULATORY_CARE_PROVIDER_SITE_OTHER): Payer: Medicare HMO | Admitting: Nurse Practitioner

## 2023-04-15 VITALS — BP 121/82 | HR 68 | Temp 98.1°F | Ht 75.0 in | Wt 197.5 lb

## 2023-04-15 DIAGNOSIS — I700 Atherosclerosis of aorta: Secondary | ICD-10-CM | POA: Diagnosis not present

## 2023-04-15 DIAGNOSIS — R5383 Other fatigue: Secondary | ICD-10-CM | POA: Insufficient documentation

## 2023-04-15 DIAGNOSIS — E1169 Type 2 diabetes mellitus with other specified complication: Secondary | ICD-10-CM

## 2023-04-15 DIAGNOSIS — J432 Centrilobular emphysema: Secondary | ICD-10-CM

## 2023-04-15 DIAGNOSIS — K760 Fatty (change of) liver, not elsewhere classified: Secondary | ICD-10-CM

## 2023-04-15 DIAGNOSIS — E785 Hyperlipidemia, unspecified: Secondary | ICD-10-CM | POA: Diagnosis not present

## 2023-04-15 DIAGNOSIS — N400 Enlarged prostate without lower urinary tract symptoms: Secondary | ICD-10-CM

## 2023-04-15 DIAGNOSIS — E278 Other specified disorders of adrenal gland: Secondary | ICD-10-CM | POA: Diagnosis not present

## 2023-04-15 DIAGNOSIS — I152 Hypertension secondary to endocrine disorders: Secondary | ICD-10-CM | POA: Diagnosis not present

## 2023-04-15 DIAGNOSIS — Z87891 Personal history of nicotine dependence: Secondary | ICD-10-CM

## 2023-04-15 DIAGNOSIS — I2510 Atherosclerotic heart disease of native coronary artery without angina pectoris: Secondary | ICD-10-CM

## 2023-04-15 DIAGNOSIS — E1140 Type 2 diabetes mellitus with diabetic neuropathy, unspecified: Secondary | ICD-10-CM | POA: Diagnosis not present

## 2023-04-15 DIAGNOSIS — I739 Peripheral vascular disease, unspecified: Secondary | ICD-10-CM | POA: Diagnosis not present

## 2023-04-15 DIAGNOSIS — E538 Deficiency of other specified B group vitamins: Secondary | ICD-10-CM

## 2023-04-15 DIAGNOSIS — I77810 Thoracic aortic ectasia: Secondary | ICD-10-CM

## 2023-04-15 DIAGNOSIS — I5032 Chronic diastolic (congestive) heart failure: Secondary | ICD-10-CM

## 2023-04-15 DIAGNOSIS — I272 Pulmonary hypertension, unspecified: Secondary | ICD-10-CM | POA: Diagnosis not present

## 2023-04-15 DIAGNOSIS — E1159 Type 2 diabetes mellitus with other circulatory complications: Secondary | ICD-10-CM | POA: Diagnosis not present

## 2023-04-15 LAB — BAYER DCA HB A1C WAIVED: HB A1C (BAYER DCA - WAIVED): 7.6 % — ABNORMAL HIGH (ref 4.8–5.6)

## 2023-04-15 LAB — MICROALBUMIN, URINE WAIVED
Creatinine, Urine Waived: 200 mg/dL (ref 10–300)
Microalb, Ur Waived: 30 mg/L — ABNORMAL HIGH (ref 0–19)
Microalb/Creat Ratio: <30 mg/g (ref <30)

## 2023-04-15 MED ORDER — MONTELUKAST SODIUM 10 MG PO TABS
10.0000 mg | ORAL_TABLET | Freq: Every day | ORAL | 4 refills | Status: DC
Start: 2023-04-15 — End: 2024-07-29

## 2023-04-15 MED ORDER — AMOXICILLIN-POT CLAVULANATE 875-125 MG PO TABS: 1.0000 | ORAL_TABLET | Freq: Two times a day (BID) | ORAL | 0 refills | Status: AC

## 2023-04-15 MED ORDER — AMLODIPINE BESYLATE 5 MG PO TABS
5.0000 mg | ORAL_TABLET | Freq: Every day | ORAL | 4 refills | Status: DC
Start: 2023-04-15 — End: 2024-08-25

## 2023-04-15 NOTE — Assessment & Plan Note (Signed)
Chronic, ongoing.  A1c 7.6%, trend up from previous 6.5%. Urine ALB 30 April 2023. Continue current medication regimen and checking BS at home daily.  Continue Metformin and Farxiga as ordered.  Will not change medications today, recommend heavy focus on diet with less pasta and potatoes.  Continue Gabapentin as prescribed by podiatry and adjust as needed based on renal function.  Return in 3 months for follow-up.

## 2023-04-15 NOTE — Assessment & Plan Note (Signed)
Chronic.  Noted on lung screening since July 2021 -- recommend focus on healthy diet and will continue to monitor. 

## 2023-04-15 NOTE — Assessment & Plan Note (Signed)
Chronic, stable.  Euvolemic on exam today.  Continue collaboration with cardiology and current medication regimen as ordered by them.  On Farxiga for prevention.  Recommend: - Reminded to call for an overnight weight gain of >2 pounds or a weekly weight weight of >5 pounds - not adding salt to his food and has been reading food labels. Reviewed the importance of keeping daily sodium intake to <2000mg daily  - Avoid Ibuprofen medications 

## 2023-04-15 NOTE — Assessment & Plan Note (Signed)
Chronic, stable.  BP at goal in office and on home readings.  Continue current medication regimen and adjust as needed.  Continue collaboration with cardiology.  Recommend he monitor BP at least a few times a week at home and document.  DASH diet focus.  LABS: CBC, CMP, TSH, urine ALB.  Urin ALB 30 April 2023.

## 2023-04-15 NOTE — Assessment & Plan Note (Signed)
Ongoing for months -- ?related to infection vs chronic disease.  Will check labs today.  Start Augmentin for ongoing cough waxing and waning since February.  He wishes to defer imaging at this time.  Discussed adding on daily inhaler and a referral to pulmonary, he refuses at this time.

## 2023-04-15 NOTE — Assessment & Plan Note (Signed)
Chronic, stable.  Continue statin and ASA daily at home for prevention + collaboration with podiatry. 

## 2023-04-15 NOTE — Assessment & Plan Note (Signed)
Continue annual lung screening.  Recommend continued cessation. 

## 2023-04-15 NOTE — Assessment & Plan Note (Signed)
Chronic.  Continue collaboration with cardiology and medication regimen as ordered.  No recent NTG use. 

## 2023-04-15 NOTE — Progress Notes (Signed)
BP 121/82   Pulse 68   Temp 98.1 F (36.7 C) (Oral)   Ht  (1.905 m)   Wt 197 lb 8 oz (89.6 kg)   SpO2 93%   BMI 24.69 kg/m    Subjective:    Patient ID: Brendan [name], male    DOB: 1951-09-12, 72 y.o.   MRN: [id number]  HPI: Brendan [name] is a 72 y.o. male  Chief Complaint  Patient presents with   Diabetes   Hypertension   Hyperlipidemia   COPD   Benign Prostatic Hypertrophy   Fatigue   Vitamin B-12 check   DIABETES A1c 6.5% in October.  Continues on Metformin 1000 MG BID and Farxiga 10 MG daily.  He denies any changes in diet, has been eating more potatoes.    Taking Gabapentin 300 MG in morning and 600 MG QHS, followed by podiatry with last visit 10/03/22.  Taking B12 daily for neuropathy and low levels in past.   Hypoglycemic episodes:no Polydipsia/polyuria: no Visual disturbance: no Chest pain: no Paresthesias: no Glucose Monitoring: yes             Accucheck frequency: Daily             Fasting glucose: <125 on average, often in 110 range             Post prandial:              Evening:             Before meals: Taking Insulin?: no             Long acting insulin:             Short acting insulin: Blood Pressure Monitoring: not checking Retinal Examination: Up To Date -- Dr. Clydene Pugh, saw 3 weeks ago Foot Exam: Up to Date Pneumovax: Up to Date Influenza: Up to Date Aspirin: yes    HYPERTENSION / HYPERLIPIDEMIA Continues on Metoprolol, Hygroton, Amlodipine, and Lipitor. Has not had to use any NTG.  Allergic to Lisinopril.   Followed by cardiology with last visit 07/18/22, they stopped Lasix and decreased K+ = he reports they ended up increasing K+ back up.  Last echo showed EF 60-65% in April 2021.  Satisfied with current treatment? yes Duration of hypertension: chronic BP monitoring frequency: a few days a week BP range: 120/70-80 range BP medication side effects: no Duration of hyperlipidemia: chronic Cholesterol medication side effects:  no Cholesterol supplements: none Medication compliance: good compliance Aspirin: yes Recent stressors: no Recurrent headaches: no Visual changes: no Palpitations: no Dyspnea: no Chest pain: no Lower extremity edema: no Dizzy/lightheaded: no   FATIGUE He does endorse feeling more tired recently, over past several months.  CAn get rest and still feels tired.  Started to have a chest cold in February, got rid of it, but then symptoms returned == producing phlegm. Duration:  months Severity: mild  Onset: gradual Context when symptoms started:  unknown Symptoms improve with rest: no  Depressive symptoms: no Stress/anxiety: no Insomnia: none Snoring: no Observed apnea by bed partner: no Daytime hypersomnolence:no Wakes feeling refreshed: no History of sleep study: no -- refuses, says he will not "use that junk" Dysnea on exertion:   a little bit on occasion Orthopnea/PND: no Chest pain: no Chronic cough: no Lower extremity edema: no Arthralgias:no Myalgias: no Weakness: no Rash: no    COPD Continues on Singulair and Albuterol. Last lung CT screening 08/07/22-- noted aortic atherosclerosis + emphysema.  Also noted  hepatic steatosis + left adrenal nodule which is stable at 1.8 cm.  Noted to have some pulmonary artery enlargement + possible chronic pancreatitis.    Has not smoked in >5 years since bypass surgery. COPD status: stable Satisfied with current treatment?: yes Oxygen use: no Dyspnea frequency: occasional Cough frequency: yes, a little bit Rescue inhaler frequency: last used Saturday, uses about 1-2 months when feels winded Limitation of activity: no Productive cough: yes Last Spirometry: April 2024 - FEV1 54% and FEV1/FVC 59% Pneumovax: Up to Date Influenza: Up to Date   BPH Continues on Flomax 0.4 MG every night. BPH status: stable Satisfied with current treatment?: yes Medication side effects: no Medication compliance: good compliance Duration:  chronic Nocturia: 2-3x per night Urinary frequency:no Incomplete voiding: no Urgency: yes Weak urinary stream: no Straining to start stream: no Dysuria: no Onset: gradual Severity: mild IPSS Questionnaire (AUA-7): 7 AUA Over the past month.   1)  How often have you had a sensation of not emptying your bladder completely after you finish urinating?  0 - Not at all  2)  How often have you had to urinate again less than two hours after you finished urinating? 0 - Not at all  3)  How often have you found you stopped and started again several times when you urinated?  2 - Less than half the time  4) How difficult have you found it to postpone urination?  2 - Less than half the time  5) How often have you had a weak urinary stream?  0 - Not at all  6) How often have you had to push or strain to begin urination?  0 - Not at all  7) How many times did you most typically get up to urinate from the time you went to bed until the time you got up in the morning?  3 - 3 times  Total score:  0-7 mildly symptomatic   8-19 moderately symptomatic   20-35 severely symptomatic        04/15/2023    2:08 PM 12/19/2022    2:17 PM 11/07/2022    1:52 PM 10/12/2022    1:07 PM 04/25/2022    1:14 PM  Depression screen PHQ 2/9  Decreased Interest 0 0 0 0 0  Down, Depressed, Hopeless 0 0 0 0 0  PHQ - 2 Score 0 0 0 0 0  Altered sleeping 0 0 0 0 0  Tired, decreased energy 1 1 1 1  0  Change in appetite 0 0 0 0 0  Feeling bad or failure about yourself  0 0 0 0 0  Trouble concentrating 0 0 0 0 0  Moving slowly or fidgety/restless 0 0 0 0 0  Suicidal thoughts 0 0 0 0 0  PHQ-9 Score 1 1 1 1  0  Difficult doing work/chores Not difficult at all Not difficult at all Not difficult at all Not difficult at all        04/15/2023    2:08 PM 12/19/2022    2:18 PM 11/07/2022    1:52 PM 10/12/2022    1:08 PM  GAD 7 : Generalized Anxiety Score  Nervous, Anxious, on Edge 0 0 0 0  Control/stop worrying 0 0 0 0  Worry  too much - different things 0 0 0 1  Trouble relaxing 0 0 0 0  Restless 0 0 0 0  Easily annoyed or irritable 0 0 0 0  Afraid - awful might happen 0 0  0 0  Total GAD 7 Score 0 0 0 1  Anxiety Difficulty Not difficult at all  Not difficult at all Not difficult at all    Relevant past medical, surgical, family and social history reviewed and updated as indicated. Interim medical history since our last visit reviewed. Allergies and medications reviewed and updated.  Review of Systems  Constitutional:  Negative for activity change, diaphoresis, fatigue and fever.  Respiratory:  Negative for cough, chest tightness, shortness of breath and wheezing.   Cardiovascular:  Negative for chest pain, palpitations and leg swelling.  Gastrointestinal: Negative.   Endocrine: Negative for cold intolerance, heat intolerance, polydipsia, polyphagia and polyuria.  Neurological: Negative.   Psychiatric/Behavioral: Negative.     Per HPI unless specifically indicated above     Objective:    BP 121/82   Pulse 68   Temp 98.1 F (36.7 C) (Oral)   Ht  (1.905 m)   Wt 197 lb 8 oz (89.6 kg)   SpO2 93%   BMI 24.69 kg/m   Wt Readings from Last 3 Encounters:  04/15/23 197 lb 8 oz (89.6 kg)  12/19/22 203 lb 9.6 oz (92.4 kg)  11/07/22 201 lb 12.8 oz (91.5 kg)    Physical Exam Vitals and nursing note reviewed.  Constitutional:      General: He is awake. He is not in acute distress.    Appearance: He is well-developed and overweight. He is not ill-appearing.  HENT:     Head: Normocephalic and atraumatic.     Right Ear: Hearing normal. No drainage.     Left Ear: Hearing normal. No drainage.  Eyes:     General: Lids are normal.        Right eye: No discharge.        Left eye: No discharge.     Conjunctiva/sclera: Conjunctivae normal.     Pupils: Pupils are equal, round, and reactive to light.  Neck:     Vascular: No carotid bruit.  Cardiovascular:     Rate and Rhythm: Normal rate and regular  rhythm.     Heart sounds: Normal heart sounds, S1 normal and S2 normal. No murmur heard.    No gallop.  Pulmonary:     Effort: Pulmonary effort is normal. No accessory muscle usage or respiratory distress.     Breath sounds: Normal breath sounds. No decreased breath sounds, wheezing or rhonchi.  Abdominal:     General: Bowel sounds are normal. There is no distension.     Palpations: Abdomen is soft.     Tenderness: There is no abdominal tenderness.  Musculoskeletal:     Cervical back: Normal range of motion and neck supple.     Right lower leg: No edema.     Left lower leg: No edema.  Skin:    General: Skin is warm and dry.  Neurological:     Mental Status: He is alert and oriented to person, place, and time.  Psychiatric:        Attention and Perception: Attention normal.        Mood and Affect: Mood normal.        Speech: Speech normal.        Behavior: Behavior normal. Behavior is cooperative.    Results for orders placed or performed in visit on 10/12/22  Bayer DCA Hb A1c Waived  Result Value Ref Range   HB A1C (BAYER DCA - WAIVED) 6.5 (H) 4.8 - 5.6 %  Comprehensive metabolic panel  Result  Value Ref Range   Glucose 89 70 - 99 mg/dL   BUN 17 8 - 27 mg/dL   Creatinine, Ser 1.61 0.76 - 1.27 mg/dL   eGFR 64 >09 UE/AVW/0.98   BUN/Creatinine Ratio 14 10 - 24   Sodium 142 134 - 144 mmol/L   Potassium 3.9 3.5 - 5.2 mmol/L   Chloride 102 96 - 106 mmol/L   CO2 22 20 - 29 mmol/L   Calcium 10.1 8.6 - 10.2 mg/dL   Total Protein 6.8 6.0 - 8.5 g/dL   Albumin 4.9 (H) 3.8 - 4.8 g/dL   Globulin, Total 1.9 1.5 - 4.5 g/dL   Albumin/Globulin Ratio 2.6 (H) 1.2 - 2.2   Bilirubin Total 0.3 0.0 - 1.2 mg/dL   Alkaline Phosphatase 60 44 - 121 IU/L   AST 18 0 - 40 IU/L   ALT 24 0 - 44 IU/L  Lipid Panel w/o Chol/HDL Ratio  Result Value Ref Range   Cholesterol, Total 102 100 - 199 mg/dL   Triglycerides 119 (H) 0 - 149 mg/dL   HDL 28 (L) >14 mg/dL   VLDL Cholesterol Cal 28 5 - 40 mg/dL    LDL Chol Calc (NIH) 46 0 - 99 mg/dL  TSH  Result Value Ref Range   TSH 1.100 0.450 - 4.500 uIU/mL      Assessment & Plan:   Problem List Items Addressed This Visit       Cardiovascular and Mediastinum   Aortic atherosclerosis (Chronic)    Chronic.  Noted on CT imaging.  Continue ASA and Lipitor daily for prevention.  Quit smoking >5 years ago.        Relevant Medications   amLODipine (NORVASC) 5 MG tablet   Other Relevant Orders   Comprehensive metabolic panel   Lipid Panel w/o Chol/HDL Ratio   Chronic heart failure with preserved ejection fraction (HFpEF) (Chronic)    Chronic, stable.  Euvolemic on exam today.  Continue collaboration with cardiology and current medication regimen as ordered by them.  On Farxiga for prevention.  Recommend: - Reminded to call for an overnight weight gain of >2 pounds or a weekly weight weight of >5 pounds - not adding salt to his food and has been reading food labels. Reviewed the importance of keeping daily sodium intake to 2000mg  daily  - Avoid Ibuprofen medications      Relevant Medications   amLODipine (NORVASC) 5 MG tablet   Other Relevant Orders   Comprehensive metabolic panel   Coronary artery disease (Chronic)    Chronic.  Continue collaboration with cardiology and medication regimen as ordered.  No recent NTG use.      Relevant Medications   amLODipine (NORVASC) 5 MG tablet   Other Relevant Orders   Comprehensive metabolic panel   Lipid Panel w/o Chol/HDL Ratio   Dilated aortic root (Chronic)    Followed by cardiology, continue this collaboration.      Relevant Medications   amLODipine (NORVASC) 5 MG tablet   Hypertension associated with diabetes (Chronic)    Chronic, stable.  BP at goal in office and on home readings.  Continue current medication regimen and adjust as needed.  Continue collaboration with cardiology.  Recommend he monitor BP at least a few times a week at home and document.  DASH diet focus.  LABS: CBC, CMP,  TSH, urine ALB.  Urin ALB 30 April 2023.        Relevant Medications   amLODipine (NORVASC) 5 MG tablet   Other Relevant  Orders   Bayer DCA Hb A1c Waived   Microalbumin, Urine Waived   Comprehensive metabolic panel   TSH   Peripheral vascular disease (Chronic)    Chronic, stable.  Continue statin and ASA daily at home for prevention + collaboration with podiatry.      Relevant Medications   amLODipine (NORVASC) 5 MG tablet   Other Relevant Orders   Comprehensive metabolic panel   Lipid Panel w/o Chol/HDL Ratio   Pulmonary hypertension (Chronic)    Pulmonary artery enlargement noted on CT lung screening, ?pulmonary arterial hypertension.  At this time continue collaboration with cardiology and current medication regimen.  Consider involvement of pulmonary in future, he does not want referral at this time.      Relevant Medications   amLODipine (NORVASC) 5 MG tablet     Respiratory   Centrilobular emphysema (Chronic)    Chronic, stable with minimal use of Albuterol.  PFT April 2024 - FEV1 54% and FEV1/FVC 59%, change from in 2022 was FEV1 64% and FEV1/FVC 73%, however has had a cough since February.  Continue to monitor and adjust regimen as needed.  Continue Albuterol and recommend he use this if wheezing present or SOB.  Continue cessation of smoking and continue yearly lung screening.  Spirometry annually.  Have recommend a daily inhaler for maintenance and to help lung function, he refuses.      Relevant Medications   montelukast (SINGULAIR) 10 MG tablet   Other Relevant Orders   CBC with Differential/Platelet   Spirometry with Graph (Completed)     Digestive   Hepatic steatosis    Chronic.  Noted on lung screening since July 2021 -- recommend focus on healthy diet and will continue to monitor.      Relevant Orders   Comprehensive metabolic panel     Endocrine   Controlled type 2 diabetes with neuropathy - Primary (Chronic)    Chronic, ongoing.  A1c 7.6%, trend up  from previous 6.5%. Urine ALB 30 April 2023. Continue current medication regimen and checking BS at home daily.  Continue Metformin and Farxiga as ordered.  Will not change medications today, recommend heavy focus on diet with less pasta and potatoes.  Continue Gabapentin as prescribed by podiatry and adjust as needed based on renal function.  Return in 3 months for follow-up.         Relevant Orders   Bayer DCA Hb A1c Waived   Microalbumin, Urine Waived   Comprehensive metabolic panel   Hyperlipidemia associated with type 2 diabetes mellitus (Chronic)    Chronic, ongoing.  Continue current medication regimen and adjust as needed.  Lipid panel today.  Recent LDL <70.      Relevant Medications   amLODipine (NORVASC) 5 MG tablet   Other Relevant Orders   Bayer DCA Hb A1c Waived   Comprehensive metabolic panel   Lipid Panel w/o Chol/HDL Ratio     Genitourinary   BPH (benign prostatic hyperplasia)    Chronic, stable.  Continue current medication regimen, Flomax, and adjust as needed.      Relevant Orders   PSA     Other   Adrenal nodule (Chronic)    Stable on imaging.  Left side, noted on CT lung screening, stable on recent check = possible adenoma reported.  Continue to monitor on screenings and send to nephrology as needed.      B12 deficiency (Chronic)    Ongoing, continue supplement.  Recheck level today.      Relevant Orders  Vitamin B12   Fatigue    Ongoing for months -- ?related to infection vs chronic disease.  Will check labs today.  Start Augmentin for ongoing cough waxing and waning since February.  He wishes to defer imaging at this time.  Discussed adding on daily inhaler and a referral to pulmonary, he refuses at this time.      Personal history of tobacco use, presenting hazards to health    Continue annual lung screening.  Recommend continued cessation.        Follow up plan: Return in about 3 weeks (around 05/06/2023) for Lung Check -- Plus needs 3 month  follow-up for T2DM, HTN/HLD, COPD.

## 2023-04-15 NOTE — Assessment & Plan Note (Signed)
Chronic, ongoing.  Continue current medication regimen and adjust as needed.  Lipid panel today.  Recent LDL <70. 

## 2023-04-15 NOTE — Assessment & Plan Note (Addendum)
Chronic.  Noted on CT imaging.  Continue ASA and Lipitor daily for prevention.  Quit smoking >5 years ago.

## 2023-04-15 NOTE — Assessment & Plan Note (Signed)
Stable on imaging.  Left side, noted on CT lung screening, stable on recent check = possible adenoma reported.  Continue to monitor on screenings and send to nephrology as needed. 

## 2023-04-15 NOTE — Assessment & Plan Note (Signed)
Ongoing, continue supplement.  Recheck level today. 

## 2023-04-15 NOTE — Assessment & Plan Note (Signed)
Chronic, stable.  Continue current medication regimen, Flomax, and adjust as needed. 

## 2023-04-15 NOTE — Assessment & Plan Note (Signed)
Chronic, stable with minimal use of Albuterol.  PFT April 2024 - FEV1 54% and FEV1/FVC 59%, change from in 2022 was FEV1 64% and FEV1/FVC 73%, however has had a cough since February.  Continue to monitor and adjust regimen as needed.  Continue Albuterol and recommend he use this if wheezing present or SOB.  Continue cessation of smoking and continue yearly lung screening.  Spirometry annually.  Have recommend a daily inhaler for maintenance and to help lung function, he refuses.

## 2023-04-15 NOTE — Assessment & Plan Note (Signed)
Pulmonary artery enlargement noted on CT lung screening, ?pulmonary arterial hypertension.  At this time continue collaboration with cardiology and current medication regimen.  Consider involvement of pulmonary in future, he does not want referral at this time.

## 2023-04-15 NOTE — Assessment & Plan Note (Signed)
Followed by cardiology, continue this collaboration. ?

## 2023-04-16 ENCOUNTER — Encounter: Payer: Self-pay | Admitting: Nurse Practitioner

## 2023-04-16 LAB — CBC WITH DIFFERENTIAL/PLATELET
Basophils Absolute: 0.1 10*3/uL (ref 0.0–0.2)
Basos: 1 %
EOS (ABSOLUTE): 0.1 10*3/uL (ref 0.0–0.4)
Eos: 1 %
Hematocrit: 49.7 % (ref 37.5–51.0)
Hemoglobin: 16.6 g/dL (ref 13.0–17.7)
Immature Grans (Abs): 0 10*3/uL (ref 0.0–0.1)
Immature Granulocytes: 0 %
Lymphocytes Absolute: 1.8 10*3/uL (ref 0.7–3.1)
Lymphs: 32 %
MCH: 29.8 pg (ref 26.6–33.0)
MCHC: 33.4 g/dL (ref 31.5–35.7)
MCV: 89 fL (ref 79–97)
Monocytes Absolute: 1.1 10*3/uL — ABNORMAL HIGH (ref 0.1–0.9)
Monocytes: 19 %
Neutrophils Absolute: 2.7 10*3/uL (ref 1.4–7.0)
Neutrophils: 47 %
Platelets: 201 10*3/uL (ref 150–450)
RBC: 5.57 x10E6/uL (ref 4.14–5.80)
RDW: 13.8 % (ref 11.6–15.4)
WBC: 5.7 10*3/uL (ref 3.4–10.8)

## 2023-04-16 LAB — LIPID PANEL W/O CHOL/HDL RATIO
Cholesterol, Total: 103 mg/dL (ref 100–199)
HDL: 31 mg/dL — ABNORMAL LOW (ref >39)
LDL Chol Calc (NIH): 55 mg/dL (ref 0–99)
Triglycerides: 86 mg/dL (ref 0–149)
VLDL Cholesterol Cal: 17 mg/dL (ref 5–40)

## 2023-04-16 LAB — COMPREHENSIVE METABOLIC PANEL
ALT: 29 IU/L (ref 0–44)
AST: 22 IU/L (ref 0–40)
Albumin/Globulin Ratio: 3 — ABNORMAL HIGH (ref 1.2–2.2)
Albumin: 4.8 g/dL (ref 3.8–4.8)
Alkaline Phosphatase: 62 IU/L (ref 44–121)
BUN/Creatinine Ratio: 14 (ref 10–24)
BUN: 19 mg/dL (ref 8–27)
Bilirubin Total: 0.3 mg/dL (ref 0.0–1.2)
CO2: 21 mmol/L (ref 20–29)
Calcium: 9.7 mg/dL (ref 8.6–10.2)
Chloride: 105 mmol/L (ref 96–106)
Creatinine, Ser: 1.32 mg/dL — ABNORMAL HIGH (ref 0.76–1.27)
Globulin, Total: 1.6 g/dL (ref 1.5–4.5)
Glucose: 106 mg/dL — ABNORMAL HIGH (ref 70–99)
Potassium: 3.7 mmol/L (ref 3.5–5.2)
Sodium: 144 mmol/L (ref 134–144)
Total Protein: 6.4 g/dL (ref 6.0–8.5)
eGFR: 58 mL/min/{1.73_m2} — ABNORMAL LOW (ref >59)

## 2023-04-16 LAB — PSA: Prostate Specific Ag, Serum: 0.4 ng/mL (ref 0.0–4.0)

## 2023-04-16 LAB — VITAMIN B12: Vitamin B-12: 1163 pg/mL (ref 232–1245)

## 2023-04-16 LAB — TSH: TSH: 1.79 u[IU]/mL (ref 0.450–4.500)

## 2023-04-16 NOTE — Progress Notes (Signed)
Good afternoon, please let Vondell know his labs have returned and overall these are stable with exception of kidney function which has trended down a little again into stage 3a kidney disease range.  Please ensure you are drinking plenty of water at home daily.  Remainder of labs are all stable.  Can continue all current medications.  Any questions? Keep being amazing!!  Thank you for allowing me to participate in your care.  I appreciate you. Kindest regards, Henriette Hesser

## 2023-04-17 ENCOUNTER — Telehealth: Payer: Self-pay | Admitting: Nurse Practitioner

## 2023-04-17 NOTE — Telephone Encounter (Signed)
Patient called back for lab results. Results given and patient verbal understanding. 

## 2023-04-18 ENCOUNTER — Encounter: Payer: Self-pay | Admitting: Podiatry

## 2023-04-22 ENCOUNTER — Ambulatory Visit: Payer: Medicare HMO | Admitting: Podiatry

## 2023-05-06 ENCOUNTER — Ambulatory Visit: Payer: Medicare HMO | Admitting: Nurse Practitioner

## 2023-05-09 ENCOUNTER — Telehealth: Payer: Self-pay | Admitting: Nurse Practitioner

## 2023-05-09 NOTE — Telephone Encounter (Signed)
Copied from CRM [id number]. Topic: Medicare AWV >> May 09, 2023  9:38 AM Payton Doughty wrote: Reason for CRM: Called patient to schedule Medicare Annual Wellness Visit (AWV). No voicemail available to leave a message.  Last date of AWV: 04/25/22  Please schedule an appointment at any time with Kennedy Bucker, LPN  .  If any questions, please contact me.  Thank you ,  Verlee Rossetti; Care Guide Ambulatory Clinical Support [place] [facility] Direct Dial: [phone]

## 2023-05-19 NOTE — Patient Instructions (Signed)
Cough, Adult A cough helps to clear your throat and lungs. It may be a sign of an illness or another condition. A short-term (acute) cough may last 2-3 weeks. A long-term (chronic) cough may last 8 or more weeks. Many things can cause a cough. They include: Illnesses such as: An infection in your throat or lungs. Asthma or other heart or lung problems. Gastroesophageal reflux. This is when acid comes back up from your stomach. Breathing in things that bother (irritate) your lungs. Allergies. Postnasal drip. This is when mucus runs down the back of your throat. Smoking. Some medicines. Follow these instructions at home: Medicines Take over-the-counter and prescription medicines only as told by your doctor. Talk with your doctor before you take cough medicine (cough suppressants). Eating and drinking Do not drink alcohol. Do not drink caffeine. Drink enough fluid to keep your pee (urine) pale yellow. Lifestyle Stay away from cigarette smoke. Do not smoke or use any products that contain nicotine or tobacco. If you need help quitting, ask your doctor. Stay away from things that make you cough. These may include perfume, candles, cleaning products, or campfire smoke. General instructions  Watch for any changes to your cough. Tell your doctor about them. Always cover your mouth when you cough. If the air is dry in your home, use a cool mist vaporizer or humidifier. If your cough is worse at night, try using extra pillows to raise your head up higher while you sleep. Rest as needed. Contact a doctor if: You have new symptoms. Your symptoms get worse. You cough up pus. You have a fever that does not go away. Your cough does not get better after 2-3 weeks. Cough medicine does not help, and you are not sleeping well. You have pain that gets worse or is not helped with medicine. You are losing weight and do not know why. You have night sweats. Get help right away if: You cough up  blood. You have trouble breathing. Your heart is beating very fast. These symptoms may be an emergency. Get help right away. Call 911. Do not wait to see if the symptoms will go away. Do not drive yourself to the hospital. This information is not intended to replace advice given to you by your health care provider. Make sure you discuss any questions you have with your health care provider. Document Revised: 08/17/2022 Document Reviewed: 08/17/2022 Elsevier Patient Education  2023 [employer]  

## 2023-05-20 ENCOUNTER — Ambulatory Visit: Payer: Medicare HMO | Admitting: Podiatry

## 2023-05-20 ENCOUNTER — Encounter: Payer: Self-pay | Admitting: Podiatry

## 2023-05-20 DIAGNOSIS — M2042 Other hammer toe(s) (acquired), left foot: Secondary | ICD-10-CM | POA: Diagnosis not present

## 2023-05-20 DIAGNOSIS — M79676 Pain in unspecified toe(s): Secondary | ICD-10-CM | POA: Diagnosis not present

## 2023-05-20 DIAGNOSIS — B351 Tinea unguium: Secondary | ICD-10-CM

## 2023-05-20 DIAGNOSIS — E1142 Type 2 diabetes mellitus with diabetic polyneuropathy: Secondary | ICD-10-CM | POA: Diagnosis not present

## 2023-05-20 DIAGNOSIS — M2041 Other hammer toe(s) (acquired), right foot: Secondary | ICD-10-CM | POA: Diagnosis not present

## 2023-05-20 DIAGNOSIS — I739 Peripheral vascular disease, unspecified: Secondary | ICD-10-CM

## 2023-05-21 NOTE — Progress Notes (Signed)
He presents today for follow-up of his diabetic feet.  He is questioning whether or not he can get new diabetic shoes.  He states that there is really been no change in the numbness of his feet he states that his last hemoglobin A1c was at 7.6.  Objective: Vital signs stable he is alert and oriented x 3 toenails recently been cut evaluation of his neurologic status today does demonstrate significant impairment per Semmes Weinstein monofilament.  No open lesions or wounds.  Hammertoe deformities noted bilateral.  Assessment: Diabetes mellitus with diabetic peripheral neuropathy hammertoe deformities no preulcerative lesions.  Plan: I will follow-up with him again in 6 months but we will go ahead and get him set up for diabetic shoes.

## 2023-05-24 ENCOUNTER — Encounter: Payer: Self-pay | Admitting: Nurse Practitioner

## 2023-05-24 ENCOUNTER — Ambulatory Visit (INDEPENDENT_AMBULATORY_CARE_PROVIDER_SITE_OTHER): Payer: Medicare HMO | Admitting: Nurse Practitioner

## 2023-05-24 VITALS — BP 131/82 | HR 68 | Temp 97.4°F | Ht 75.0 in | Wt 197.4 lb

## 2023-05-24 DIAGNOSIS — R5383 Other fatigue: Secondary | ICD-10-CM | POA: Diagnosis not present

## 2023-05-24 DIAGNOSIS — J432 Centrilobular emphysema: Secondary | ICD-10-CM

## 2023-05-24 NOTE — Assessment & Plan Note (Signed)
Improving at this time with improvement of cough -- monitor closely and if worsening to let provider know.

## 2023-05-24 NOTE — Progress Notes (Signed)
BP 131/82   Pulse 68   Temp (!) 97.4 F (36.3 C) (Oral)   Ht 6\' 3"  (1.905 m)   Wt 197 lb 6.4 oz (89.5 kg)   SpO2 92%   BMI 24.67 kg/m    Subjective:    Patient ID: Brendan [name], male    DOB: 03/29/1951, 72 y.o.   MRN: [id number]  HPI: Brendan [name] is a 72 y.o. male  Chief Complaint  Patient presents with   Lung Check    Cough is gone   UPPER RESPIRATORY TRACT INFECTION Follow-up today for cough -- was treated with Augmentin on 04/15/23.  Cough has improved, just a little phlegm at times. Fever: no Cough:  no Shortness of breath: improved, only is lots of activity Wheezing:  very little bit on occasion Chest pain: no Chest tightness: no Chest congestion: no Nasal congestion:  occasional Runny nose: no Post nasal drip: yes Sneezing: no Sore throat: no Swollen glands: no Sinus pressure: no Headache: no Face pain: no Toothache: no Ear pain: none Ear pressure: none Eyes red/itching:no Eye drainage/crusting: no  Vomiting: no Rash: no Fatigue:  this week has been feeling better -- less fatigue Sick contacts: no Strep contacts: no  Context: better Recurrent sinusitis: no Relief with OTC cold/cough medications: yes  Treatments attempted: cold/sinus, mucinex, and antibiotics    Relevant past medical, surgical, family and social history reviewed and updated as indicated. Interim medical history since our last visit reviewed. Allergies and medications reviewed and updated.  Review of Systems  Constitutional:  Negative for activity change, diaphoresis, fatigue and fever.  Respiratory:  Negative for cough, chest tightness, shortness of breath and wheezing.   Cardiovascular:  Negative for chest pain, palpitations and leg swelling.  Gastrointestinal: Negative.   Endocrine: Negative for cold intolerance, heat intolerance, polydipsia, polyphagia and polyuria.  Neurological: Negative.   Psychiatric/Behavioral: Negative.      Per HPI unless specifically indicated  above     Objective:    BP 131/82   Pulse 68   Temp (!) 97.4 F (36.3 C) (Oral)   Ht 6\' 3"  (1.905 m)   Wt 197 lb 6.4 oz (89.5 kg)   SpO2 92%   BMI 24.67 kg/m   Wt Readings from Last 3 Encounters:  05/24/23 197 lb 6.4 oz (89.5 kg)  04/15/23 197 lb 8 oz (89.6 kg)  12/19/22 203 lb 9.6 oz (92.4 kg)    Physical Exam Vitals and nursing note reviewed.  Constitutional:      General: He is awake. He is not in acute distress.    Appearance: He is well-developed and overweight. He is not ill-appearing.  HENT:     Head: Normocephalic and atraumatic.     Right Ear: Hearing normal. No drainage.     Left Ear: Hearing normal. No drainage.  Eyes:     General: Lids are normal.        Right eye: No discharge.        Left eye: No discharge.     Conjunctiva/sclera: Conjunctivae normal.     Pupils: Pupils are equal, round, and reactive to light.  Neck:     Vascular: No carotid bruit.  Cardiovascular:     Rate and Rhythm: Normal rate and regular rhythm.     Heart sounds: Normal heart sounds, S1 normal and S2 normal. No murmur heard.    No gallop.  Pulmonary:     Effort: Pulmonary effort is normal. No accessory muscle usage or respiratory distress.  Breath sounds: Normal breath sounds. No decreased breath sounds, wheezing or rhonchi.  Abdominal:     General: Bowel sounds are normal. There is no distension.     Palpations: Abdomen is soft.     Tenderness: There is no abdominal tenderness.  Musculoskeletal:     Cervical back: Normal range of motion and neck supple.     Right lower leg: No edema.     Left lower leg: No edema.  Skin:    General: Skin is warm and dry.  Neurological:     Mental Status: He is alert and oriented to person, place, and time.  Psychiatric:        Attention and Perception: Attention normal.        Mood and Affect: Mood normal.        Speech: Speech normal.        Behavior: Behavior normal. Behavior is cooperative.     Results for orders placed or  performed in visit on 04/16/23  HM DIABETES EYE EXAM  Result Value Ref Range   HM Diabetic Eye Exam Retinopathy (A) No Retinopathy      Assessment & Plan:   Problem List Items Addressed This Visit       Respiratory   Centrilobular emphysema (HCC) - Primary (Chronic)    Chronic, stable with minimal use of Albuterol.  PFT April 2024 - FEV1 54% and FEV1/FVC 59%, change from in 2022 was FEV1 64% and FEV1/FVC 73%, however had a cough at time of testing -- would benefit repeat in future.  Cough overall improved at this time.  Continue to monitor and adjust regimen as needed.  Continue Albuterol and recommend he use this if wheezing present or SOB.  Continue cessation of smoking and continue yearly lung screening.  Spirometry annually.  Have recommend a daily inhaler for maintenance and to help lung function, he refuses.        Other   Fatigue    Improving at this time with improvement of cough -- monitor closely and if worsening to let provider know.        Follow up plan: Return for as scheduled July 17th.

## 2023-05-24 NOTE — Assessment & Plan Note (Signed)
Chronic, stable with minimal use of Albuterol.  PFT April 2024 - FEV1 54% and FEV1/FVC 59%, change from in 2022 was FEV1 64% and FEV1/FVC 73%, however had a cough at time of testing -- would benefit repeat in future.  Cough overall improved at this time.  Continue to monitor and adjust regimen as needed.  Continue Albuterol and recommend he use this if wheezing present or SOB.  Continue cessation of smoking and continue yearly lung screening.  Spirometry annually.  Have recommend a daily inhaler for maintenance and to help lung function, he refuses.

## 2023-05-28 ENCOUNTER — Encounter: Payer: Self-pay | Admitting: Emergency Medicine

## 2023-05-28 ENCOUNTER — Emergency Department
Admission: EM | Admit: 2023-05-28 | Discharge: 2023-05-28 | Disposition: A | Discharge status: Home / Self Care | Payer: Medicare HMO | Attending: Emergency Medicine | Admitting: Emergency Medicine

## 2023-05-28 ENCOUNTER — Other Ambulatory Visit: Payer: Self-pay

## 2023-05-28 ENCOUNTER — Other Ambulatory Visit: Payer: Self-pay | Admitting: Nurse Practitioner

## 2023-05-28 ENCOUNTER — Emergency Department: Payer: Medicare HMO

## 2023-05-28 DIAGNOSIS — R42 Dizziness and giddiness: Secondary | ICD-10-CM | POA: Diagnosis not present

## 2023-05-28 DIAGNOSIS — Z951 Presence of aortocoronary bypass graft: Secondary | ICD-10-CM | POA: Diagnosis not present

## 2023-05-28 DIAGNOSIS — R079 Chest pain, unspecified: Secondary | ICD-10-CM | POA: Diagnosis not present

## 2023-05-28 LAB — BASIC METABOLIC PANEL
Anion gap: 14 (ref 5–15)
BUN: 23 mg/dL (ref 8–23)
CO2: 21 mmol/L — ABNORMAL LOW (ref 22–32)
Calcium: 9.3 mg/dL (ref 8.9–10.3)
Chloride: 104 mmol/L (ref 98–111)
Creatinine, Ser: 1.21 mg/dL (ref 0.61–1.24)
GFR, Estimated: >60 mL/min (ref >60)
Glucose, Bld: 191 mg/dL — ABNORMAL HIGH (ref 70–99)
Potassium: 3.5 mmol/L (ref 3.5–5.1)
Sodium: 139 mmol/L (ref 135–145)

## 2023-05-28 LAB — CBC
HCT: 51 % (ref 39.0–52.0)
Hemoglobin: 16.9 g/dL (ref 13.0–17.0)
MCH: 29.9 pg (ref 26.0–34.0)
MCHC: 33.1 g/dL (ref 30.0–36.0)
MCV: 90.3 fL (ref 80.0–100.0)
Platelets: 179 10*3/uL (ref 150–400)
RBC: 5.65 MIL/uL (ref 4.22–5.81)
RDW: 13 % (ref 11.5–15.5)
WBC: 7.3 10*3/uL (ref 4.0–10.5)
nRBC: 0 % (ref 0.0–0.2)

## 2023-05-28 LAB — TROPONIN I (HIGH SENSITIVITY)
Troponin I (High Sensitivity): 3 ng/L (ref <18)
Troponin I (High Sensitivity): 7 ng/L (ref <18)

## 2023-05-28 NOTE — ED Triage Notes (Signed)
Patient to ED for mid chest pain and dizziness. 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 Hx of bypass 5 years ago. Chest feels tight and pressure- more so when taking a deep breath.

## 2023-05-28 NOTE — ED Provider Notes (Signed)
[facility] Provider Note    Event Date/Time   First MD Initiated Contact with Patient 05/28/23 2224     (approximate)   History   Dizziness  HPI  Brendan [name] is a 72 y.o. male   who presents to the emergency department today with primary concern for an episode of dizziness.  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  He stood up and then the dizziness went away after 2 to 3 minutes.  This was accompanied by an uneasy feeling in his chest.  He denies similar symptoms in the past.  Did have a CABG a number of years ago.  States that he follows up with his cardiologist yearly.  Since that episode he has had no further episodes of dizziness.  He denies any recent travel.  Denies any swelling in any extremities.      Physical Exam   Triage Vital Signs: ED Triage Vitals  Enc Vitals Group     BP 05/28/23 1631 (!) 160/114     Pulse Rate 05/28/23 1631 73     Resp 05/28/23 1631 18     Temp 05/28/23 1631 98.1 F (36.7 C)     Temp Source 05/28/23 1631 Oral     SpO2 05/28/23 1631 91 %     Weight --      Height --      Head Circumference --      Peak Flow --      Pain Score 05/28/23 1630 3     Pain Loc --      Pain Edu? --      Excl. in GC? --     Most recent vital signs: Vitals:   05/28/23 1631 05/28/23 2158  BP: (!) 160/114 (!) 156/87  Pulse: 73 60  Resp: 18 18  Temp: 98.1 F (36.7 C) 98.6 F (37 C)  SpO2: 91% 94%   General: Awake, alert, oriented. CV:  Good peripheral perfusion. Regular rate and rhythm. Resp:  Normal effort. Lungs clear. Abd:  No distention. Non tender. Other:  No lower extremity edema.   ED Results / Procedures / Treatments   Labs (all labs ordered are listed, but only abnormal results are displayed) Labs Reviewed  BASIC METABOLIC PANEL - Abnormal; Notable for the following components:      Result Value   CO2 21 (*)    Glucose, Bld 191 (*)    All other  components within normal limits  CBC  TROPONIN I (HIGH SENSITIVITY)  TROPONIN I (HIGH SENSITIVITY)     EKG  I, Phineas Semen, attending physician, personally viewed and interpreted this EKG  EKG Time: 1631 Rate: 79 Rhythm: sinus rhythm with sinus arrythmia Axis: normal Intervals: qtc 424 QRS: narrow ST changes: no st elevation Impression: abnormal ekg    RADIOLOGY I independently interpreted and visualized the CXR. My interpretation: No pneumonia Radiology interpretation:  IMPRESSION:  Hyperinflation with chronic lung changes. No consolidation or  effusion.      PROCEDURES:  Critical Care performed: No    MEDICATIONS ORDERED IN ED: Medications - No data to display   IMPRESSION / MDM / ASSESSMENT AND PLAN / ED COURSE  I reviewed the triage vital signs and the nursing notes.                              Differential  diagnosis includes, but is not limited to, ACS, anemia, electrolyte abnormality, inner ear issue, dehydration  Patient's presentation is most consistent with acute presentation with potential threat to life or bodily function.   The patient is on the cardiac monitor to evaluate for evidence of arrhythmia and/or significant heart rate changes.   Patient presented to the emergency department today after an episode of dizziness.  At time of exam he states he feels better.  The episode lasted 2 to 3 minutes.  EKG without concerning arrhythmia or ST elevation.  Blood work without anemia or electrolyte abnormality.  This point somewhat unclear etiology of the patient's episode but I do wonder if it was an inner ear issue given that he bent over when it started.  This time think it is reasonable for patient be discharged home and patient feels comfortable with that plan.  Did discuss with the patient importance of following up with primary care and to return if any new symptoms arose.     FINAL CLINICAL IMPRESSION(S) / ED DIAGNOSES   Final diagnoses:   Dizziness    Note:  This document was prepared using Dragon voice recognition software and may include unintentional dictation errors.    Phineas Semen, MD 05/28/23 [phone]

## 2023-05-28 NOTE — ED Provider Triage Note (Signed)
Emergency Medicine Provider Triage Evaluation Note  Brendan [name], a 72 y.o. male  was evaluated in triage.  Pt complains of mid chest pain and intermittent dizziness.  Patient reports symptoms for the last week.  He reports intermittent chest tightness and pressure with taking a deep breath.  Patient with a history of PVD, HTN, and CABG x 4 about 6 years prior..  Review of Systems  Positive: CP, dizziness Negative: SOB  Physical Exam  BP (!) 156/87 (BP Location: Right Arm)   Pulse 60   Temp 98.6 F (37 C)   Resp 18   SpO2 94%  Gen:   Awake, no distress  NAD Resp:  Normal effort CTA MSK:   Moves extremities without difficulty  Other:    Medical Decision Making  Medically screening exam initiated at 10:07 PM.  Appropriate orders placed.  Brendan [name] was informed that the remainder of the evaluation will be completed by another provider, this initial triage assessment does not replace that evaluation, and the importance of remaining in the ED until their evaluation is complete.  Patient to the ED for evaluation of 1 week of intermittent chest pain and dizziness.   Lissa Hoard, PA-C 05/28/23 2210

## 2023-05-28 NOTE — Discharge Instructions (Signed)
Please seek medical attention for any high fevers, chest pain, shortness of breath, change in behavior, persistent vomiting, bloody stool or any other new or concerning symptoms.  

## 2023-05-29 NOTE — Telephone Encounter (Signed)
Requested medication (s) are due for refill today: expired medication  Requested medication (s) are on the active medication list: yes  Last refill:  03/30/22 #90 4 refills  Future visit scheduled: yes in 1 month  Notes to clinic:  expired medication do you want to renew Rx?     Requested Prescriptions  Pending Prescriptions Disp Refills   metoprolol tartrate (LOPRESSOR) 25 MG tablet [Pharmacy Med Name: METOPROLOL TARTRATE 25 MG TAB] 90 tablet 0    Sig: Take 0.5 tablets (12.5 mg total) by mouth 2 (two) times daily.     Cardiovascular:  Beta Blockers Passed - 05/28/2023 12:32 PM      Passed - Last BP in normal range    BP Readings from Last 1 Encounters:  05/28/23 (!) 149/89         Passed - Last Heart Rate in normal range    Pulse Readings from Last 1 Encounters:  05/28/23 63         Passed - Valid encounter within last 6 months    Recent Outpatient Visits           5 days ago Centrilobular emphysema (HCC)   [place] [facility] Far[place], [place] T, NP   1 month ago Controlled type 2 diabetes with neuropathy (HCC)   [place] [facility] [place], Corrie Dandy T, NP   5 months ago Chronic right shoulder pain   [place] [facility] [place], [place] T, NP   6 months ago Chronic right shoulder pain   [place] [facility] [place], [place] T, NP   7 months ago Controlled type 2 diabetes with neuropathy [facility])   [place] [facility] [place], Dorie Rank, NP       Future Appointments             In 1 month [name], Dorie Rank, NP [place] [facility]

## 2023-06-04 ENCOUNTER — Telehealth: Payer: Self-pay

## 2023-06-04 ENCOUNTER — Ambulatory Visit (INDEPENDENT_AMBULATORY_CARE_PROVIDER_SITE_OTHER): Payer: Medicare HMO | Admitting: Family Medicine

## 2023-06-04 ENCOUNTER — Encounter: Payer: Self-pay | Admitting: Family Medicine

## 2023-06-04 VITALS — BP 129/76 | HR 73 | Temp 98.6°F | Ht 75.0 in | Wt 195.8 lb

## 2023-06-04 DIAGNOSIS — I2510 Atherosclerotic heart disease of native coronary artery without angina pectoris: Secondary | ICD-10-CM | POA: Diagnosis not present

## 2023-06-04 DIAGNOSIS — E1140 Type 2 diabetes mellitus with diabetic neuropathy, unspecified: Secondary | ICD-10-CM | POA: Diagnosis not present

## 2023-06-04 DIAGNOSIS — R42 Dizziness and giddiness: Secondary | ICD-10-CM | POA: Diagnosis not present

## 2023-06-04 DIAGNOSIS — Z7984 Long term (current) use of oral hypoglycemic drugs: Secondary | ICD-10-CM | POA: Diagnosis not present

## 2023-06-04 NOTE — Telephone Encounter (Signed)
Transition Care Management Unsuccessful Follow-up Telephone Call  Date of discharge and from where:  05/28/2023 [facility]  Attempts:  2nd Attempt  Reason for unsuccessful TCM follow-up call:  No answer/busy  Nyelle Wolfson [facility]  [facility] Population Health Community Resource Care Guide   [email or web address]  ?? [phone]   Website: [email or web address]  [email or web address]

## 2023-06-04 NOTE — Telephone Encounter (Signed)
Transition Care Management Unsuccessful Follow-up Telephone Call  Date of discharge and from where:  05/28/2023 [facility]  Attempts:  1st Attempt  Reason for unsuccessful TCM follow-up call:  No answer/busy  Kirtan Sada [facility]  [facility] Population Health Community Resource Care Guide   [email or web address]  ?? [phone]   Website: [email or web address]  [email or web address]

## 2023-06-04 NOTE — Assessment & Plan Note (Signed)
Needs diabetic shoes. Seeing podiatry on Friday. Will order. Call with any concerns.

## 2023-06-04 NOTE — Progress Notes (Signed)
BP 129/76   Pulse 73   Temp 98.6 F (37 C) (Oral)   Ht 6\' 3"  (1.905 m)   Wt 195 lb 12.8 oz (88.8 kg)   SpO2 94%   BMI 24.47 kg/m    Subjective:    Patient ID: Brendan [name], male    DOB: 1951/05/11, 72 y.o.   MRN: [id number]  HPI: Brendan [name] is a 72 y.o. male  Chief Complaint  Patient presents with   Dizziness    Patient says he recently went to the ER due to dizzy spells and some chest tightness. Patient says he was told after the EKG, he was told that his arteries were cleared. Patient says he think he chest tightness was from him lifting heavy things at work. Patient it just felt at the moment a little stretch in his chest. Patient says he feels better and haven't had any episodes since.    ER FOLLOW UP Time since discharge: 1 week Hospital/facility: [facility] Diagnosis: dizziness Procedures/tests:  Narrative & Impression  CLINICAL DATA:  Chest pain   EXAM: CHEST - 2 VIEW   COMPARISON:  X-ray 09/25/2017.  CT 08/05/2022   FINDINGS: Hyperinflation with chronic lung changes. No consolidation, pneumothorax, effusion or edema. Normal cardiopericardial silhouette. Calcified mediastinal lymph nodes. Status post median sternotomy.   IMPRESSION: Hyperinflation with chronic lung changes. No consolidation or effusion.  Normal labs Consultants: None New medications: None  Discharge instructions: Follow up here   Status: better  Arza notes that he's feeling much better since he went to the ER. 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 He notes that he was working pretty hard and had been bending over. There was no chest pain or any SOB. He notes that the dizziness lasted about 5 minutes and then resolved, but he was worried about it so he went to the ER. He has not had any issues since then and has been feeling back to normal. He does not that he has bene a little tired.    He also notes that he  needs diabetic shoes. Seeing podiatry on Friday. Has hammer toe and neuropathy. He follows regularly with his PCP and had his last A1c done in April. Scheduled for follow up in July. No other concerns or complaints at this time.   Relevant past medical, surgical, family and social history reviewed and updated as indicated. Interim medical history since our last visit reviewed. Allergies and medications reviewed and updated.  Review of Systems  Constitutional:  Positive for fatigue. Negative for activity change, appetite change, chills, diaphoresis, fever and unexpected weight change.  Respiratory: Negative.    Cardiovascular: Negative.        Chest tightness  Musculoskeletal: Negative.   Neurological:  Positive for dizziness. Negative for tremors, seizures, syncope, facial asymmetry, speech difficulty, weakness, light-headedness, numbness and headaches.  Psychiatric/Behavioral: Negative.      Per HPI unless specifically indicated above     Objective:    BP 129/76   Pulse 73   Temp 98.6 F (37 C) (Oral)   Ht 6\' 3"  (1.905 m)   Wt 195 lb 12.8 oz (88.8 kg)   SpO2 94%   BMI 24.47 kg/m   Wt Readings from Last 3 Encounters:  06/04/23 195 lb 12.8 oz (88.8 kg)  05/24/23 197 lb 6.4 oz (89.5 kg)  04/15/23 197 lb 8 oz (89.6 kg)    Physical Exam  Vitals and nursing note reviewed.  Constitutional:      General: He is not in acute distress.    Appearance: Normal appearance. He is obese. He is not ill-appearing, toxic-appearing or diaphoretic.  HENT:     Head: Normocephalic and atraumatic.     Right Ear: External ear normal.     Left Ear: External ear normal.     Nose: Nose normal.     Mouth/Throat:     Mouth: Mucous membranes are moist.     Pharynx: Oropharynx is clear.  Eyes:     General: No scleral icterus.       Right eye: No discharge.        Left eye: No discharge.     Extraocular Movements: Extraocular movements intact.     Conjunctiva/sclera: Conjunctivae normal.      Pupils: Pupils are equal, round, and reactive to light.  Cardiovascular:     Rate and Rhythm: Normal rate and regular rhythm.     Pulses: Normal pulses.     Heart sounds: Normal heart sounds. No murmur heard.    No friction rub. No gallop.  Pulmonary:     Effort: Pulmonary effort is normal. No respiratory distress.     Breath sounds: Normal breath sounds. No stridor. No wheezing, rhonchi or rales.  Chest:     Chest wall: No tenderness.  Musculoskeletal:        General: Normal range of motion.     Cervical back: Normal range of motion and neck supple.  Skin:    General: Skin is warm and dry.     Capillary Refill: Capillary refill takes less than 2 seconds.     Coloration: Skin is not jaundiced or pale.     Findings: No bruising, erythema, lesion or rash.  Neurological:     General: No focal deficit present.     Mental Status: He is alert and oriented to person, place, and time. Mental status is at baseline.  Psychiatric:        Mood and Affect: Mood normal.        Behavior: Behavior normal.        Thought Content: Thought content normal.        Judgment: Judgment normal.     Results for orders placed or performed during the hospital encounter of 05/28/23  Basic metabolic panel  Result Value Ref Range   Sodium 139 135 - 145 mmol/L   Potassium 3.5 3.5 - 5.1 mmol/L   Chloride 104 98 - 111 mmol/L   CO2 21 (L) 22 - 32 mmol/L   Glucose, Bld 191 (H) 70 - 99 mg/dL   BUN 23 8 - 23 mg/dL   Creatinine, Ser 1.61 0.61 - 1.24 mg/dL   Calcium 9.3 8.9 - 09.6 mg/dL   GFR, Estimated >04 >54 mL/min   Anion gap 14 5 - 15  CBC  Result Value Ref Range   WBC 7.3 4.0 - 10.5 K/uL   RBC 5.65 4.22 - 5.81 MIL/uL   Hemoglobin 16.9 13.0 - 17.0 g/dL   HCT 09.8 11.9 - 14.7 %   MCV 90.3 80.0 - 100.0 fL   MCH 29.9 26.0 - 34.0 pg   MCHC 33.1 30.0 - 36.0 g/dL   RDW 82.9 56.2 - 13.0 %   Platelets 179 150 - 400 K/uL   nRBC 0.0 0.0 - 0.2 %  Troponin I (High Sensitivity)  Result Value Ref Range    Troponin I (High Sensitivity) 3 <18 ng/L  Troponin I (High Sensitivity)  Result Value Ref Range   Troponin I (High Sensitivity) 7 <18 ng/L      Assessment & Plan:   Problem List Items Addressed This Visit       Cardiovascular and Mediastinum   Coronary artery disease (Chronic)    Due to see cardiology again in July, but doesn't have follow up scheduled yet. Due to recent symptoms we will get him back into see them. Await their input.        Endocrine   Controlled type 2 diabetes with neuropathy (HCC) (Chronic)    Needs diabetic shoes. Seeing podiatry on Friday. Will order. Call with any concerns.       Other Visit Diagnoses     Dizziness    -  Primary   Resolved now. No incidents since last week. Will get him back into cardiology. Continue to monitor. Call with any dizziness.        Follow up plan: Return if symptoms worsen or fail to improve.   25 minutes spent with patient today

## 2023-06-04 NOTE — Assessment & Plan Note (Signed)
Due to see cardiology again in July, but doesn't have follow up scheduled yet. Due to recent symptoms we will get him back into see them. Await their input.

## 2023-06-07 ENCOUNTER — Ambulatory Visit: Payer: Medicare HMO | Admitting: Internal Medicine

## 2023-06-07 ENCOUNTER — Ambulatory Visit: Payer: Medicare HMO | Admitting: *Deleted

## 2023-06-07 DIAGNOSIS — E1142 Type 2 diabetes mellitus with diabetic polyneuropathy: Secondary | ICD-10-CM

## 2023-06-07 DIAGNOSIS — M2041 Other hammer toe(s) (acquired), right foot: Secondary | ICD-10-CM

## 2023-06-07 NOTE — Addendum Note (Signed)
Addended by: Lottie Rater E on: 06/07/2023 01:06 PM   Modules accepted: Orders

## 2023-06-07 NOTE — Progress Notes (Signed)
Patient presents today to measured for diabetic shoes and insoles.  Patient was measured for 1 pair of diabetic shoes and 3 pairs of foam casted diabetic insoles.   Ht 6'3 Wt 197 Shoe size 14w Shoe type 84 or492  Treating physician jolene cannady / chrismon  Re-appointment for regularly scheduled diabetic foot care visits or if they should experience any trouble with the shoes or insoles.

## 2023-06-14 NOTE — Progress Notes (Unsigned)
Cardiology Office Note    Date:  06/17/2023   ID:  Brendan [name], DOB 1951-04-12, MRN [id number]  PCP:  Marjie Skiff, NP  Cardiologist:  Yvonne Kendall, MD  Electrophysiologist:  None   Chief Complaint: ED follow up  History of Present Illness:   Brendan [name] is a 72 y.o. male with history of CAD status post four-vessel CABG in 06/2017 with LIMA to LAD, SVG to D2, SVG to OM, SVG to PDA, postoperative A. fib, carotid artery stenosis, DM2, HTN, HLD, COPD secondary to tobacco use who presents for ED follow-up.  He underwent diagnostic LHC in 05/2017 for exertional angina which showed three-vessel CAD. This was followed by four-vessel CABG on 07/02/2017 with postoperative course being complicated by pneumothorax requiring chest tube placement along with postoperative A. fib treated with a short course of amiodarone. There has been no documented evidence of recurrence of A. fib.  Preoperative carotid artery ultrasound in 2018 showed 1 to 39% right ICA and 40 to 59% left ICA stenosis.  Echo in 03/2020 demonstrated an EF of 60 to 65%, normal LV diastolic function parameters, mildly reduced RV systolic function with normal ventricular cavity size, no significant valvular abnormalities, and a mildly dilated aortic root measuring 41 mm.  Lexiscan MPI in 04/2020 showed no evidence of ischemia with an EF of 55 to 65%, and was low risk.  He has prior history of orthostatic lightheadedness when standing up quickly that did not improve with de-escalation of furosemide that was stable at his most recent visit in 06/2022.  He was evaluated in the ED on 05/28/2023 with dizziness that occurred when bending down and improved after standing up for 2 to 3 minutes and was associated with chest tightness high-sensitivity troponin negative x 2.  Chest x-ray with hyperinflation with chronic lung changes without consolidation or effusion.  EKG showed sinus rhythm with sinus arrhythmia with nonspecific ST-T changes.  Blood  pressure was elevated in the 150s to 160s systolic in the ED.  ER queried possible inner ear issue.  He followed up with his PCP on 06/04/2023 and reported feeling much better since his ED evaluation.  He comes in today noting intermittent bilateral chest tightness that feels like "a pulled muscle" that typically is noticeable after he has been lifting something heavy at work.  No associated symptoms.  He is unclear if this feels like his prior angina leading up to his CABG.  He also continues to note longstanding history of positional dizziness and has not had any further dizziness since his episode while at work leading to his ED evaluation last month.  During that above episode of dizziness, he was without chest pain, palpitations, diaphoresis, presyncope, or syncope.  No lower extremity swelling or progressive orthopnea.  Currently without symptoms of chest pain or dizziness.   Labs independently reviewed: 05/2023 - Hgb 16.9, PLT 179 potassium 3.5, BUN 23, serum creatinine 1.21 04/2023 - TSH normal, TC 103, TG 86, HDL 31, LDL 55, albumin 4.8, AST/ALT normal, A1c 7.6  Past Medical History:  Diagnosis Date   CAD (coronary artery disease)    a. 05/2016 Lexi MV: EF 58%, no ischemia/infarct; b. 05/2017 Cath: LM 20-30, LAD 50p, 42m, D1 50p, D2 80ost, LCX 90/70ost, RCA 80/90p/m, RPL1 50p, EF 50-55%; c. 06/2017 CABG x 4 (LIMA->LAD, VG->D2,VG->OM, VG->PDA).   Diabetes mellitus without complication (HCC)    Diastolic dysfunction    a. 05/2016 Echo: EF 55%, Gr1 DD, mild TR/MR.   Hyperlipidemia  Hypertension    Hypokalemia    Personal history of tobacco use, presenting hazards to health 05/03/2016   Post-op Afib    a. 06/2017 following CABG->short course of amio.    Past Surgical History:  Procedure Laterality Date   BACK SURGERY     CARDIAC CATHETERIZATION     CORONARY ARTERY BYPASS GRAFT N/A 07/01/2017   Procedure: CORONARY ARTERY BYPASS GRAFTING (CABG) x4, using left mammary artery and right greater  saphenous vein harvested endoscopically;  Surgeon: Kerin Perna, MD;  Location: [facility] OR;  Service: Open Heart Surgery;  Laterality: N/A;   KNEE SURGERY     LEFT HEART CATH AND CORONARY ANGIOGRAPHY N/A 06/04/2017   Procedure: Left Heart Cath and Coronary Angiography;  Surgeon: Yvonne Kendall, MD;  Location: [facility] INVASIVE CV LAB;  Service: Cardiovascular;  Laterality: N/A;   TEE WITHOUT CARDIOVERSION N/A 07/01/2017   Procedure: TRANSESOPHAGEAL ECHOCARDIOGRAM (TEE);  Surgeon: Donata Clay, Theron Arista, MD;  Location: [facility] OR;  Service: Open Heart Surgery;  Laterality: N/A;    Current Medications: Current Meds  Medication Sig   acetaminophen (TYLENOL) 500 MG tablet Take 1 tablet (500 mg total) by mouth every 6 (six) hours as needed for mild pain or headache.   albuterol (VENTOLIN HFA) 108 (90 Base) MCG/ACT inhaler Inhale 2 puffs into the lungs every 6 (six) hours as needed for wheezing or shortness of breath.   amLODipine (NORVASC) 5 MG tablet Take 1 tablet (5 mg total) by mouth daily.   aspirin EC 81 MG tablet Take 1 tablet (81 mg total) by mouth daily.   atorvastatin (LIPITOR) 20 MG tablet Take 1 tablet (20 mg total) by mouth daily.   azelastine (ASTELIN) 0.1 % nasal spray Place 2 sprays into both nostrils at bedtime.   chlorthalidone (HYGROTON) 25 MG tablet Take 1 tablet (25 mg total) by mouth daily.   cyanocobalamin 1000 MCG tablet Take 1,000 mcg by mouth every other day.   dapagliflozin propanediol (FARXIGA) 10 MG TABS tablet Take 1 tablet (10 mg total) by mouth daily before breakfast.   gabapentin (NEURONTIN) 300 MG capsule Take one capsule at lunchtime and two capsules at bedtime   lidocaine (LIDODERM) 5 % Place 1 patch onto the skin daily. Remove & Discard patch within 12 hours or as directed by MD   loratadine (CLARITIN) 10 MG tablet Take 1 tablet (10 mg total) by mouth daily.   metFORMIN (GLUCOPHAGE) 1000 MG tablet Take 1 tablet (1,000 mg total) by mouth 2 (two) times daily with a meal.    metoprolol tartrate (LOPRESSOR) 25 MG tablet Take 0.5 tablets (12.5 mg total) by mouth 2 (two) times daily.   montelukast (SINGULAIR) 10 MG tablet Take 1 tablet (10 mg total) by mouth at bedtime.   potassium chloride SA (KLOR-CON M) 20 MEQ tablet Take 1 tablet (20 mEq total) by mouth daily.   sildenafil (REVATIO) 20 MG tablet TAKE 1 TO 5 TABLETS AS NEEDED.   tamsulosin (FLOMAX) 0.4 MG CAPS capsule Take 1 capsule (0.4 mg total) by mouth daily.    Allergies:   Lisinopril   Social History   Socioeconomic History   Marital status: Widowed    Spouse name: Not on file   Number of children: Not on file   Years of education: Not on file   Highest education level: 10th grade  Occupational History   Occupation: works part time as Copy   Tobacco Use   Smoking status: Former    Packs/day: 1.00    Years:  60.00    Additional pack years: 0.00    Total pack years: 60.00    Types: Cigarettes    Quit date: 06/29/2017    Years since quitting: 5.9   Smokeless tobacco: Never  Vaping Use   Vaping Use: Never used  Substance and Sexual Activity   Alcohol use: No    Comment: previously drank heavily but quit ~ 21 yrs ago.   Drug use: No   Sexual activity: Yes  Other Topics Concern   Not on file  Social History Narrative   Lives in [place] by himself.  Works as Copy.  Does not routinely exercise.   Social Determinants of Health   Financial Resource Strain: Low Risk  (04/25/2022)   Overall Financial Resource Strain (CARDIA)    Difficulty of Paying Living Expenses: Not hard at all  Food Insecurity: No Food Insecurity (04/25/2022)   Hunger Vital Sign    Worried About Running Out of Food in the Last Year: Never true    Ran Out of Food in the Last Year: Never true  Transportation Needs: No Transportation Needs (04/25/2022)   PRAPARE - Administrator, Civil Service (Medical): No    Lack of Transportation (Non-Medical): No  Physical Activity: Unknown (04/25/2022)   Exercise Vital  Sign    Days of Exercise per Week: 5 days    Minutes of Exercise per Session: Not on file  Stress: No Stress Concern Present (04/25/2022)   [employer] of Occupational Health - Occupational Stress Questionnaire    Feeling of Stress : Not at all  Social Connections: Moderately Isolated (04/25/2022)   Social Connection and Isolation Panel [NHANES]    Frequency of Communication with Friends and Family: Once a week    Frequency of Social Gatherings with Friends and Family: Three times a week    Attends Religious Services: 1 to 4 times per year    Active Member of Clubs or Organizations: No    Attends Banker Meetings: Never    Marital Status: Widowed     Family History:  The patient's family history includes Diabetes in his mother; Heart disease (age of onset: 48) in his father; Hypertension in his mother.  ROS:   12-point review of systems is negative unless otherwise noted in the HPI.   EKGs/Labs/Other Studies Reviewed:    Studies reviewed were summarized above. The additional studies were reviewed today:  LHC 05/2017: Conclusions: Severe, three vessel coronary artery disease with diffuse calcification, as detailed below. Basal and mid inferior hypokinesis with otherwise preserved left ventricular contraction (LVEF 50-55%). Normal left ventricular filling pressure.   Recommendations: Outpatient cardiac surgery evaluation given severe three-vessel, diabetes mellitus, and continued exertional angina despite aggressive medical therapy. Continue secondary prevention. __________   TEE 06/2017:  Left ventricle: Normal cavity size, wall thickness, left ventricular  diastolic function and left atrial pressure. LV systolic function is low  normal with an EF of 50-55%. No thrombus present. No mass present.   Mitral valve: Trace regurgitation.   Right ventricle: Normal cavity size, wall thickness and ejection  fraction.   Tricuspid valve: Trace  regurgitation. __________  2D echo was 04/12/2020: 1. Left ventricular ejection fraction, by estimation, is 60 to 65%. The  left ventricle has normal function. Left ventricular endocardial border  not optimally defined to evaluate regional wall motion. Left ventricular  diastolic parameters were normal.   2. Right ventricular systolic function is mildly reduced. The right  ventricular size is normal.  3. The mitral valve is grossly normal. No evidence of mitral valve  regurgitation. No evidence of mitral stenosis.   4. The aortic valve was not well visualized. Aortic valve regurgitation  is not visualized. No aortic stenosis is present.   5. Pulmonic valve regurgitation not well assessed.   6. Aortic dilatation noted. There is mild dilatation of the aortic root  measuring 41 mm.  __________  Eugenie Birks MPI 05/02/2020: T wave inversion was noted during stress in the V3, V4 and V5 leads. There was no ST segment deviation noted during stress. The study is normal. This is a low risk study. The left ventricular ejection fraction is normal (55-65%).   EKG:  EKG is ordered today.  The EKG ordered today demonstrates sinus rhythm with sinus arrhythmia and occasional PACs, 60 bpm, no acute ST-T changes  Recent Labs: 04/15/2023: ALT 29; TSH 1.790 05/28/2023: BUN 23; Creatinine, Ser 1.21; Hemoglobin 16.9; Platelets 179; Potassium 3.5; Sodium 139  Recent Lipid Panel    Component Value Date/Time   CHOL 103 04/15/2023 1406   CHOL 111 02/29/2016 1558   TRIG 86 04/15/2023 1406   TRIG 73 02/29/2016 1558   HDL 31 (L) 04/15/2023 1406   CHOLHDL 3.5 08/21/2018 1453   VLDL 15 02/29/2016 1558   LDLCALC 55 04/15/2023 1406    PHYSICAL EXAM:    VS:  BP (!) 134/96 (BP Location: Left Arm, Patient Position: Sitting, Cuff Size: Normal)   Pulse 68   Ht 6\' 3"  (1.905 m)   Wt 196 lb 6.4 oz (89.1 kg)   SpO2 94%   BMI 24.55 kg/m   BMI: Body mass index is 24.55 kg/m.  Physical Exam Vitals reviewed.   Constitutional:      Appearance: He is well-developed.  HENT:     Head: Normocephalic and atraumatic.  Eyes:     General:        Right eye: No discharge.        Left eye: No discharge.  Neck:     Vascular: No JVD.  Cardiovascular:     Rate and Rhythm: Normal rate and regular rhythm.     Heart sounds: Normal heart sounds, S1 normal and S2 normal. Heart sounds not distant. No midsystolic click and no opening snap. No murmur heard.    No friction rub.  Pulmonary:     Effort: Pulmonary effort is normal. No respiratory distress.     Breath sounds: Normal breath sounds. No decreased breath sounds, wheezing or rales.  Chest:     Chest wall: No tenderness.  Abdominal:     General: There is no distension.  Musculoskeletal:     Cervical back: Normal range of motion.     Right lower leg: No edema.     Left lower leg: No edema.  Skin:    General: Skin is warm and dry.     Nails: There is no clubbing.  Neurological:     Mental Status: He is alert and oriented to person, place, and time.  Psychiatric:        Speech: Speech normal.        Behavior: Behavior normal.        Thought Content: Thought content normal.        Judgment: Judgment normal.     Wt Readings from Last 3 Encounters:  06/17/23 196 lb 6.4 oz (89.1 kg)  06/04/23 195 lb 12.8 oz (88.8 kg)  05/24/23 197 lb 6.4 oz (89.5 kg)     Orthostatic  vital signs: Lying: 136/88, 58 bpm Sitting: 119/80, 80 bpm Standing: 147/97, 66 bpm Standing x 3 minutes: 124/84, 66 bpm  ASSESSMENT & PLAN:   CAD this post CABG with chest pain with moderate risk for cardiac etiology: Currently chest pain-free.  He notes intermittent chest tightness that typically occurs after he has been lifting something heavy at work that is described as a "pulled muscle."  Pain is not reproducible to his palpation.  Schedule Lexiscan MPI to evaluate for high risk ischemia.  Continue aggressive risk factor modification and secondary prevention including  aspirin, amlodipine, atorvastatin, metoprolol tartrate, and as needed SL NTG.  HFpEF: Euvolemic and well compensated.  He remains on Farxiga.  Dizziness: Longstanding history of orthostatic dizziness.  He is mildly orthostatic in the office today.  Recommend adequate hydration.  No symptoms of palpitations during episode of dizziness.  Obtain echo to evaluate for any new structural abnormalities or cardiomyopathy.  Update carotid artery ultrasound.  Carotid artery stenosis: Carotid artery ultrasound at time of CABG in 2018 showed 1 to 39% right ICA stenosis and 40 to 59% left ICA stenosis.  Schedule carotid artery ultrasound.  Remains on aspirin and statin.  HTN: Blood pressure is reasonably controlled in the office today.  HLD associated with DM2: LDL 55 in 04/2023 with normal AST/ALT at that time.  He remains on atorvastatin with ongoing management of diabetes per PCP.  Dilated aortic root: Update echo.   Informed Consent   Shared Decision Making/Informed Consent{  The risks [chest pain, shortness of breath, cardiac arrhythmias, dizziness, blood pressure fluctuations, myocardial infarction, stroke/transient ischemic attack, nausea, vomiting, allergic reaction, radiation exposure, metallic taste sensation and life-threatening complications (estimated to be 1 in 10,000)], benefits (risk stratification, diagnosing coronary artery disease, treatment guidance) and alternatives of a nuclear stress test were discussed in detail with Mr. Clemenson and he agrees to proceed.        Disposition: F/u with Dr. Okey Dupre or an APP in 2 months.   Medication Adjustments/Labs and Tests Ordered: Current medicines are reviewed at length with the patient today.  Concerns regarding medicines are outlined above. Medication changes, Labs and Tests ordered today are summarized above and listed in the Patient Instructions accessible in Encounters.   Signed, Eula Listen, PA-C 06/17/2023 5:08 PM     [facility]  - [place] [address] [place], [place] [address] [phone]

## 2023-06-17 ENCOUNTER — Ambulatory Visit: Payer: Medicare HMO | Attending: Internal Medicine | Admitting: Physician Assistant

## 2023-06-17 ENCOUNTER — Encounter: Payer: Self-pay | Admitting: Physician Assistant

## 2023-06-17 VITALS — BP 134/96 | HR 68 | Ht 75.0 in | Wt 196.4 lb

## 2023-06-17 DIAGNOSIS — Z7984 Long term (current) use of oral hypoglycemic drugs: Secondary | ICD-10-CM

## 2023-06-17 DIAGNOSIS — R072 Precordial pain: Secondary | ICD-10-CM | POA: Diagnosis not present

## 2023-06-17 DIAGNOSIS — I10 Essential (primary) hypertension: Secondary | ICD-10-CM | POA: Diagnosis not present

## 2023-06-17 DIAGNOSIS — E1169 Type 2 diabetes mellitus with other specified complication: Secondary | ICD-10-CM | POA: Diagnosis not present

## 2023-06-17 DIAGNOSIS — I6523 Occlusion and stenosis of bilateral carotid arteries: Secondary | ICD-10-CM

## 2023-06-17 DIAGNOSIS — R42 Dizziness and giddiness: Secondary | ICD-10-CM

## 2023-06-17 DIAGNOSIS — I77810 Thoracic aortic ectasia: Secondary | ICD-10-CM | POA: Diagnosis not present

## 2023-06-17 DIAGNOSIS — I25118 Atherosclerotic heart disease of native coronary artery with other forms of angina pectoris: Secondary | ICD-10-CM

## 2023-06-17 DIAGNOSIS — I5032 Chronic diastolic (congestive) heart failure: Secondary | ICD-10-CM

## 2023-06-17 DIAGNOSIS — E785 Hyperlipidemia, unspecified: Secondary | ICD-10-CM | POA: Diagnosis not present

## 2023-06-17 NOTE — Patient Instructions (Signed)
Medication Instructions:  Your physician recommends that you continue on your current medications as directed. Please refer to the Current Medication list given to you today.  *If you need a refill on your cardiac medications before your next appointment, please call your pharmacy*   Lab Work: none If you have labs (blood work) drawn today and your tests are completely normal, you will receive your results only by: MyChart Message (if you have MyChart) OR A paper copy in the mail If you have any lab test that is abnormal or we need to change your treatment, we will call you to review the results.   Testing/Procedures: Your physician has requested that you have an echocardiogram. Echocardiography is a painless test that uses sound waves to create images of your heart. It provides your doctor with information about the size and shape of your heart and how well your heart's chambers and valves are working.   You may receive an ultrasound enhancing agent through an IV if needed to better visualize your heart during the echo. This procedure takes approximately one hour.  There are no restrictions for this procedure.  This will take place in our office at [address] ([facility], [place] [address]   Your physician has requested that you have a carotid duplex. This test is an ultrasound of the carotid arteries in your neck. It looks at blood flow through these arteries that supply the brain with blood. Allow one hour for this exam. There are no restrictions or special instructions. This will take place in our office at [address] ([facility], [place] [address]   [facility] MYOVIEW  Your Provider has ordered a Stress Test with nuclear imaging. The purpose of this test is to evaluate the blood supply to your heart muscle. This procedure is referred to as a "Non-Invasive Stress Test." This is because other than having an IV started in your vein, nothing is  inserted or "invades" your body. Cardiac stress tests are done to find areas of poor blood flow to the heart by determining the extent of coronary artery disease (CAD). Some patients exercise on a treadmill, which naturally increases the blood flow to your heart, while others who are unable to walk on a treadmill due to physical limitations have a pharmacologic/chemical stress agent called Lexiscan. This medicine will mimic walking on a treadmill by temporarily increasing your coronary blood flow.     REPORT TO [facility] ENTRANCE  **Proceed to the 1st desk on the right, REGISTRATION, to check in**  Please note: this test may take anywhere between 2-4 hours to complete    Instructions regarding medication:   _X__:   You may take all of your regular morning medications the day of your test unless listed below.    __X__:  Hold other medications as follows:  Hold metformin the day of test in the morning   How to prepare for your Myoview test:  Do not eat or drink for 6 hours prior to the test No caffeine for 24 hours prior to the test No smoking 24 hours prior to the test. Ladies, please do not wear dresses.  Skirts or pants are appropriate. Please wear a short sleeve shirt. No perfume, cologne or lotion. Wear comfortable walking shoes. No heels!   PLEASE NOTIFY THE OFFICE AT LEAST 24 HOURS IN ADVANCE IF YOU ARE UNABLE TO KEEP YOUR APPOINTMENT.  [phone] AND  PLEASE NOTIFY NUCLEAR MEDICINE AT [facility] AT  LEAST 24 HOURS IN ADVANCE IF YOU ARE UNABLE TO KEEP YOUR APPOINTMENT. [phone]         Follow-Up: At [facility], you and your health needs are our priority.  As part of our continuing mission to provide you with exceptional heart care, we have created designated Provider Care Teams.  These Care Teams include your primary Cardiologist (physician) and Advanced Practice Providers (APPs -  Physician Assistants and Nurse Practitioners) who all work together to  provide you with the care you need, when you need it.  We recommend signing up for the patient portal called "MyChart".  Sign up information is provided on this After Visit Summary.  MyChart is used to connect with patients for Virtual Visits (Telemedicine).  Patients are able to view lab/test results, encounter notes, upcoming appointments, etc.  Non-urgent messages can be sent to your provider as well.   To learn more about what you can do with MyChart, go to [email or web address].    Your next appointment:   2 month(s)  Provider:   Yvonne Kendall, MD or Eula Listen, PA-C

## 2023-06-21 ENCOUNTER — Encounter
Admission: RE | Admit: 2023-06-21 | Discharge: 2023-06-21 | Disposition: A | Discharge status: Home / Self Care | Payer: Medicare HMO | Source: Ambulatory Visit | Attending: Physician Assistant | Admitting: Physician Assistant

## 2023-06-21 DIAGNOSIS — R072 Precordial pain: Secondary | ICD-10-CM | POA: Diagnosis not present

## 2023-06-21 LAB — NM MYOCAR MULTI W/SPECT W/WALL MOTION / EF
LV dias vol: 66 mL (ref 62–150)
LV sys vol: 29 mL
Nuc Stress EF: 56 %
Peak HR: 93 {beats}/min
Percent HR: 62 %
Rest HR: 60 {beats}/min
Rest Nuclear Isotope Dose: 10.8 mCi
SDS: 0
SRS: 3
SSS: 0
ST Depression (mm): 0 mm
Stress Nuclear Isotope Dose: 30.4 mCi
TID: 1.11

## 2023-06-21 MED ORDER — TECHNETIUM TC 99M TETROFOSMIN IV KIT
10.0000 | PACK | Freq: Once | INTRAVENOUS | Status: AC
  Administered 2023-06-21: 10.75 via INTRAVENOUS

## 2023-06-21 MED ORDER — TECHNETIUM TC 99M TETROFOSMIN IV KIT
30.0000 | PACK | Freq: Once | INTRAVENOUS | Status: AC
  Administered 2023-06-21: 30.41 via INTRAVENOUS

## 2023-06-21 MED ORDER — REGADENOSON 0.4 MG/5ML IV SOLN
0.4000 mg | Freq: Once | INTRAVENOUS | Status: AC
  Administered 2023-06-21: 0.4 mg via INTRAVENOUS
  Filled 2023-06-21: qty 5

## 2023-06-24 ENCOUNTER — Telehealth: Payer: Self-pay | Admitting: Physician Assistant

## 2023-06-24 NOTE — Telephone Encounter (Signed)
Patient made aware of results and verbalized understanding.  

## 2023-06-24 NOTE — Telephone Encounter (Signed)
Patient returning call for stress test results. 

## 2023-07-10 ENCOUNTER — Telehealth: Payer: Self-pay | Admitting: Nurse Practitioner

## 2023-07-10 NOTE — Telephone Encounter (Signed)
Copied from CRM [id number]. Topic: Medicare AWV >> Jul 10, 2023  9:52 AM Payton Doughty wrote: Reason for CRM: N/A 07/10/2023 FOR AWV - NO VOICEMAIL  Verlee Rossetti; Care Guide Ambulatory Clinical Support Alligator l [facility] Direct Dial: [phone]

## 2023-07-14 NOTE — Patient Instructions (Addendum)
Check fasting in morning goal <130 Check 2 hours after a meal goal <180  Be Involved in Caring For Your Health:  Taking Medications When medications are taken as directed, they can greatly improve your health. But if they are not taken as prescribed, they may not work. In some cases, not taking them correctly can be harmful. To help ensure your treatment remains effective and safe, understand your medications and how to take them. Bring your medications to each visit for review by your provider.  Your lab results, notes, and after visit summary will be available on My Chart. We strongly encourage you to use this feature. If lab results are abnormal the clinic will contact you with the appropriate steps. If the clinic does not contact you assume the results are satisfactory. You can always view your results on My Chart. If you have questions regarding your health or results, please contact the clinic during office hours. You can also ask questions on My Chart.  We at [facility] are grateful that you chose [place] to provide your care. We strive to provide evidence-based and compassionate care and are always looking for feedback. If you get a survey from the clinic please complete this so we can hear your opinions.  Diabetes Mellitus Basics  Diabetes mellitus, or diabetes, is a long-term (chronic) disease. It occurs when the body does not properly use sugar (glucose) that is released from food after you eat. Diabetes mellitus may be caused by one or both of these problems: Your pancreas does not make enough of a hormone called insulin. Your body does not react in a normal way to the insulin that it makes. Insulin lets glucose enter cells in your body. This gives you energy. If you have diabetes, glucose cannot get into cells. This causes high blood glucose (hyperglycemia). How to treat and manage diabetes You may need to take insulin or other diabetes medicines daily to keep your glucose  in balance. If you are prescribed insulin, you will learn how to give yourself insulin by injection. You may need to adjust the amount of insulin you take based on the foods that you eat. You will need to check your blood glucose levels using a glucose monitor as told by your health care provider. The readings can help determine if you have low or high blood glucose. Generally, you should have these blood glucose levels: Before meals (preprandial): 80-130 mg/dL (1.6-1.0 mmol/L). After meals (postprandial): below 180 mg/dL (10 mmol/L). Hemoglobin A1c (HbA1c) level: less than 7%. Your health care provider will set treatment goals for you. Keep all follow-up visits. This is important. Follow these instructions at home: Diabetes medicines Take your diabetes medicines every day as told by your health care provider. List your diabetes medicines here: Name of medicine: ______________________________ Amount (dose): _______________ Time (a.m./p.m.): _______________ Notes: ___________________________________ Name of medicine: ______________________________ Amount (dose): _______________ Time (a.m./p.m.): _______________ Notes: ___________________________________ Name of medicine: ______________________________ Amount (dose): _______________ Time (a.m./p.m.): _______________ Notes: ___________________________________ Insulin If you use insulin, list the types of insulin you use here: Insulin type: ______________________________ Amount (dose): _______________ Time (a.m./p.m.): _______________Notes: ___________________________________ Insulin type: ______________________________ Amount (dose): _______________ Time (a.m./p.m.): _______________ Notes: ___________________________________ Insulin type: ______________________________ Amount (dose): _______________ Time (a.m./p.m.): _______________ Notes: ___________________________________ Insulin type: ______________________________ Amount (dose):  _______________ Time (a.m./p.m.): _______________ Notes: ___________________________________ Insulin type: ______________________________ Amount (dose): _______________ Time (a.m./p.m.): _______________ Notes: ___________________________________ Managing blood glucose  Check your blood glucose levels using a glucose monitor as told by your health care provider.  Write down the times that you check your glucose levels here: Time: _______________ Notes: ___________________________________ Time: _______________ Notes: ___________________________________ Time: _______________ Notes: ___________________________________ Time: _______________ Notes: ___________________________________ Time: _______________ Notes: ___________________________________ Time: _______________ Notes: ___________________________________  Low blood glucose Low blood glucose (hypoglycemia) is when glucose is at or below 70 mg/dL (3.9 mmol/L). Symptoms may include: Feeling: Hungry. Sweaty and clammy. Irritable or easily upset. Dizzy. Sleepy. Having: A fast heartbeat. A headache. A change in your vision. Numbness around the mouth, lips, or tongue. Having trouble with: Moving (coordination). Sleeping. Treating low blood glucose To treat low blood glucose, eat or drink something containing sugar right away. If you can think clearly and swallow safely, follow the 15:15 rule: Take 15 grams of a fast-acting carb (carbohydrate), as told by your health care provider. Some fast-acting carbs are: Glucose tablets: take 3-4 tablets. Hard candy: eat 3-5 pieces. Fruit juice: drink 4 oz (120 mL). Regular (not diet) soda: drink 4-6 oz (120-180 mL). Honey or sugar: eat 1 Tbsp (15 mL). Check your blood glucose levels 15 minutes after you take the carb. If your glucose is still at or below 70 mg/dL (3.9 mmol/L), take 15 grams of a carb again. If your glucose does not go above 70 mg/dL (3.9 mmol/L) after 3 tries, get help right  away. After your glucose goes back to normal, eat a meal or a snack within 1 hour. Treating very low blood glucose If your glucose is at or below 54 mg/dL (3 mmol/L), you have very low blood glucose (severe hypoglycemia). This is an emergency. Do not wait to see if the symptoms will go away. Get medical help right away. Call your local emergency services (911 in the [place] Do not drive yourself to the hospital. Questions to ask your health care provider Should I talk with a diabetes educator? What equipment will I need to care for myself at home? What diabetes medicines do I need? When should I take them? How often do I need to check my blood glucose levels? What number can I call if I have questions? When is my follow-up visit? Where can I find a support group for people with diabetes? Where to find more information American Diabetes Association: www.diabetes.org Association of Diabetes Care and Education Specialists: www.diabeteseducator.org Contact a health care provider if: Your blood glucose is at or above 240 mg/dL (65.7 mmol/L) for 2 days in a row. You have been sick or have had a fever for 2 days or more, and you are not getting better. You have any of these problems for more than 6 hours: You cannot eat or drink. You feel nauseous. You vomit. You have diarrhea. Get help right away if: Your blood glucose is lower than 54 mg/dL (3 mmol/L). You get confused. You have trouble thinking clearly. You have trouble breathing. These symptoms may represent a serious problem that is an emergency. Do not wait to see if the symptoms will go away. Get medical help right away. Call your local emergency services (911 in the [place] Do not drive yourself to the hospital. Summary Diabetes mellitus is a chronic disease that occurs when the body does not properly use sugar (glucose) that is released from food after you eat. Take insulin and diabetes medicines as told. Check your blood glucose  every day, as often as told. Keep all follow-up visits. This is important. This information is not intended to replace advice given to you by your health care provider. Make sure you discuss any questions you  have with your health care provider. Document Revised: 04/19/2020 Document Reviewed: 04/19/2020 Elsevier Patient Education  2024 [employer].

## 2023-07-17 ENCOUNTER — Ambulatory Visit (INDEPENDENT_AMBULATORY_CARE_PROVIDER_SITE_OTHER): Payer: Medicare HMO | Admitting: Nurse Practitioner

## 2023-07-17 ENCOUNTER — Encounter: Payer: Self-pay | Admitting: Nurse Practitioner

## 2023-07-17 VITALS — BP 130/83 | HR 65 | Temp 98.1°F | Ht 75.0 in | Wt 194.6 lb

## 2023-07-17 DIAGNOSIS — E1140 Type 2 diabetes mellitus with diabetic neuropathy, unspecified: Secondary | ICD-10-CM | POA: Diagnosis not present

## 2023-07-17 DIAGNOSIS — I2510 Atherosclerotic heart disease of native coronary artery without angina pectoris: Secondary | ICD-10-CM | POA: Diagnosis not present

## 2023-07-17 DIAGNOSIS — Z0000 Encounter for general adult medical examination without abnormal findings: Secondary | ICD-10-CM

## 2023-07-17 DIAGNOSIS — E785 Hyperlipidemia, unspecified: Secondary | ICD-10-CM | POA: Diagnosis not present

## 2023-07-17 DIAGNOSIS — E1169 Type 2 diabetes mellitus with other specified complication: Secondary | ICD-10-CM | POA: Diagnosis not present

## 2023-07-17 DIAGNOSIS — E1159 Type 2 diabetes mellitus with other circulatory complications: Secondary | ICD-10-CM

## 2023-07-17 DIAGNOSIS — I152 Hypertension secondary to endocrine disorders: Secondary | ICD-10-CM | POA: Diagnosis not present

## 2023-07-17 DIAGNOSIS — I77810 Thoracic aortic ectasia: Secondary | ICD-10-CM

## 2023-07-17 DIAGNOSIS — I272 Pulmonary hypertension, unspecified: Secondary | ICD-10-CM | POA: Diagnosis not present

## 2023-07-17 DIAGNOSIS — J432 Centrilobular emphysema: Secondary | ICD-10-CM | POA: Diagnosis not present

## 2023-07-17 DIAGNOSIS — I739 Peripheral vascular disease, unspecified: Secondary | ICD-10-CM | POA: Diagnosis not present

## 2023-07-17 DIAGNOSIS — I5032 Chronic diastolic (congestive) heart failure: Secondary | ICD-10-CM

## 2023-07-17 LAB — BAYER DCA HB A1C WAIVED: HB A1C (BAYER DCA - WAIVED): 8.5 % — ABNORMAL HIGH (ref 4.8–5.6)

## 2023-07-17 MED ORDER — SITAGLIPTIN PHOSPHATE 100 MG PO TABS: 100.0000 mg | ORAL_TABLET | Freq: Every day | ORAL | 4 refills | Status: DC

## 2023-07-17 NOTE — Assessment & Plan Note (Addendum)
Pulmonary artery enlargement noted on CT lung screening, ?pulmonary arterial hypertension.  At this time continue collaboration with cardiology and current medication regimen.  Consider involvement of pulmonary in future, he does not want referral at this time. Repeat lung screening upcoming will determine if ongoing present on screening and continue to recommend pulmonary if so.

## 2023-07-17 NOTE — Assessment & Plan Note (Signed)
Chronic, stable with minimal use of Albuterol.  PFT April 2024 - FEV1 54% and FEV1/FVC 59%, change from in 2022 was FEV1 64% and FEV1/FVC 73%, however had a cough at time of testing -- would benefit repeat in future.  Continue to monitor and adjust regimen as needed.  Continue Albuterol and recommend he use this if wheezing present or SOB.  Continue cessation of smoking and continue yearly lung screening.  Spirometry annually.  Have recommend a daily inhaler for maintenance and to help lung function, he refuses.

## 2023-07-17 NOTE — Progress Notes (Signed)
BP 130/83   Pulse 65   Temp 98.1 F (36.7 C) (Oral)   Ht 6\' 3"  (1.905 m)   Wt 194 lb 9.6 oz (88.3 kg)   SpO2 94%   BMI 24.32 kg/m    Subjective:    Patient ID: Brendan [name], male    DOB: 05/05/1951, 72 y.o.   MRN: [id number]  HPI: Brendan [name] is a 72 y.o. male  Chief Complaint  Patient presents with   Diabetes   Hypertension   Hyperlipidemia   COPD   DIABETES A1c 7.6% April.  Continues on Metformin 1000 MG BID and Farxiga 10 MG daily.  We decided not to change anything at time and work on diet.    Taking Gabapentin 300 MG in morning and 600 MG at bedtime.  Taking B12 daily for neuropathy and low levels in past.   Hypoglycemic episodes:no Polydipsia/polyuria: no Visual disturbance: no Chest pain: no Paresthesias: no Glucose Monitoring: yes             Accucheck frequency: Daily             Fasting glucose: 110-120 range             Post prandial:              Evening:             Before meals: Taking Insulin?: no             Long acting insulin:             Short acting insulin: Blood Pressure Monitoring: not checking Retinal Examination: Up To Date -- Dr. Janett Billow Exam: Up to Date Pneumovax: Up to Date Influenza: Up to Date Aspirin: yes    HYPERTENSION / HYPERLIPIDEMIA Continues on Metoprolol, [place], Amlodipine, and Lipitor. Has not had to use any NTG.  Allergic to Lisinopril.   Followed by cardiology ([facility]) with last visit 06/17/23, he went for stress test and Myo -- EF 66%.  Overall testing was stable. Satisfied with current treatment? yes Duration of hypertension: chronic BP monitoring frequency: a few days a week BP range: 120/70-80 range BP medication side effects: no Duration of hyperlipidemia: chronic Cholesterol medication side effects: no Cholesterol supplements: none Medication compliance: good compliance Aspirin: yes Recent stressors: no Recurrent headaches: no Visual changes: no Palpitations: no Dyspnea: no Chest pain:  no Lower extremity edema: no Dizzy/lightheaded: no   COPD Continues on Singulair and Albuterol. Lung CT screening 08/07/22-- noted aortic atherosclerosis + emphysema.  Noted hepatic steatosis + left adrenal nodule which is stable at 1.8 cm.  Noted to have some pulmonary artery enlargement + possible chronic pancreatitis -- scheduled for recheck 08/09/23.    Has not smoked in >5 years since bypass surgery. COPD status: stable Satisfied with current treatment?: yes Oxygen use: no Dyspnea frequency: no Cough frequency: no Rescue inhaler frequency: has not used in 3 months Limitation of activity: no Productive cough: yes Last Spirometry: April 2024 - FEV1 54% and FEV1/FVC 59% Pneumovax: Up to Date Influenza: Up to Date      07/17/2023    2:44 PM 06/04/2023    4:08 PM 05/24/2023    3:48 PM 04/15/2023    2:08 PM 12/19/2022    2:17 PM  Depression screen PHQ 2/9  Decreased Interest 0 0 0 0 0  Down, Depressed, Hopeless 0 0 0 0 0  PHQ - 2 Score 0 0 0 0 0  Altered sleeping 0 0 0 0  0  Tired, decreased energy 0 1 1 1 1   Change in appetite 0 0 0 0 0  Feeling bad or failure about yourself  0 0 0 0 0  Trouble concentrating 0 0 0 0 0  Moving slowly or fidgety/restless 0 0 0 0 0  Suicidal thoughts 0 0 0 0 0  PHQ-9 Score 0 1 1 1 1   Difficult doing work/chores Not difficult at all Not difficult at all Not difficult at all Not difficult at all Not difficult at all       07/17/2023    2:44 PM 06/04/2023    4:08 PM 05/24/2023    3:48 PM 04/15/2023    2:08 PM  GAD 7 : Generalized Anxiety Score  Nervous, Anxious, on Edge 0 0 0 0  Control/stop worrying 0 1 0 0  Worry too much - different things 0 1 1 0  Trouble relaxing 0 0 0 0  Restless 0 0 0 0  Easily annoyed or irritable 0 0 0 0  Afraid - awful might happen 0 0 0 0  Total GAD 7 Score 0 2 1 0  Anxiety Difficulty Not difficult at all Not difficult at all Not difficult at all Not difficult at all    Relevant past medical, surgical, family and  social history reviewed and updated as indicated. Interim medical history since our last visit reviewed. Allergies and medications reviewed and updated.  Review of Systems  Constitutional:  Negative for activity change, diaphoresis, fatigue and fever.  Respiratory:  Negative for cough, chest tightness, shortness of breath and wheezing.   Cardiovascular:  Negative for chest pain, palpitations and leg swelling.  Gastrointestinal: Negative.   Endocrine: Negative for cold intolerance, heat intolerance, polydipsia, polyphagia and polyuria.  Neurological: Negative.   Psychiatric/Behavioral: Negative.     Per HPI unless specifically indicated above     Objective:    BP 130/83   Pulse 65   Temp 98.1 F (36.7 C) (Oral)   Ht 6\' 3"  (1.905 m)   Wt 194 lb 9.6 oz (88.3 kg)   SpO2 94%   BMI 24.32 kg/m   Wt Readings from Last 3 Encounters:  07/17/23 194 lb 9.6 oz (88.3 kg)  06/17/23 196 lb 6.4 oz (89.1 kg)  06/04/23 195 lb 12.8 oz (88.8 kg)    Physical Exam Vitals and nursing note reviewed.  Constitutional:      General: He is awake. He is not in acute distress.    Appearance: He is well-developed and overweight. He is not ill-appearing.  HENT:     Head: Normocephalic and atraumatic.     Right Ear: Hearing normal. No drainage.     Left Ear: Hearing normal. No drainage.  Eyes:     General: Lids are normal.        Right eye: No discharge.        Left eye: No discharge.     Conjunctiva/sclera: Conjunctivae normal.     Pupils: Pupils are equal, round, and reactive to light.  Neck:     Vascular: No carotid bruit.  Cardiovascular:     Rate and Rhythm: Normal rate and regular rhythm.     Heart sounds: Normal heart sounds, S1 normal and S2 normal. No murmur heard.    No gallop.  Pulmonary:     Effort: Pulmonary effort is normal. No accessory muscle usage or respiratory distress.     Breath sounds: Normal breath sounds. No decreased breath sounds, wheezing or rhonchi.  Abdominal:      General: Bowel sounds are normal. There is no distension.     Palpations: Abdomen is soft.     Tenderness: There is no abdominal tenderness.  Musculoskeletal:     Cervical back: Normal range of motion and neck supple.     Right lower leg: No edema.     Left lower leg: No edema.  Skin:    General: Skin is warm and dry.  Neurological:     Mental Status: He is alert and oriented to person, place, and time.  Psychiatric:        Attention and Perception: Attention normal.        Mood and Affect: Mood normal.        Speech: Speech normal.        Behavior: Behavior normal. Behavior is cooperative.    Results for orders placed or performed during the hospital encounter of 06/21/23  NM Myocar Multi W/Spect W/Wall Motion / EF  Result Value Ref Range   Rest BP 145/91 mmHg   Rest HR 60.0 bpm   Peak HR 93 bpm   Peak BP 150/91 mmHg   Percent HR 62.0 %   ST Depression (mm) 0 mm   Rest Nuclear Isotope Dose 10.8 mCi   Stress Nuclear Isotope Dose 30.4 mCi   SSS 0.0    SRS 3.0    SDS 0.0    TID 1.11    LV sys vol 29.0 mL   LV dias vol 66.0 62 - 150 mL   Nuc Stress EF 56 %      Assessment & Plan:   Problem List Items Addressed This Visit       Cardiovascular and Mediastinum   Chronic heart failure with preserved ejection fraction (HFpEF) (HCC) (Chronic)    Chronic, stable.  Euvolemic on exam today.  Continue collaboration with cardiology and current medication regimen as ordered by them, recent notes and testing reviewed.  On Farxiga for prevention.  Recommend: - Reminded to call for an overnight weight gain of >2 pounds or a weekly weight weight of >5 pounds - not adding salt to his food and has been reading food labels. Reviewed the importance of keeping daily sodium intake to 2000mg  daily  - Avoid Ibuprofen medications      Relevant Orders   Comprehensive metabolic panel   Lipid Panel w/o Chol/HDL Ratio   Coronary artery disease (Chronic)    Chronic.  Continue collaboration  with cardiology and medication regimen as ordered.  No recent NTG use.      Relevant Orders   Comprehensive metabolic panel   Lipid Panel w/o Chol/HDL Ratio   Dilated aortic root (HCC) (Chronic)    Followed by cardiology, continue this collaboration.  Recent notes and testing reviewed.      Relevant Orders   Comprehensive metabolic panel   Lipid Panel w/o Chol/HDL Ratio   Hypertension associated with diabetes (HCC) (Chronic)    Chronic, stable.  BP at goal in office and on home readings.  Continue current medication regimen and adjust as needed.  Continue collaboration with cardiology, recent notes reviewed.  Recommend he monitor BP at least a few times a week at home and document.  DASH diet focus.  LABS: CMP.  Urine ALB 30 April 2023.        Relevant Medications   sitaGLIPtin (JANUVIA) 100 MG tablet   Other Relevant Orders   Bayer DCA Hb A1c Waived   Comprehensive metabolic panel  Peripheral vascular disease (HCC) (Chronic)    Chronic, stable.  Continue statin and ASA daily at home for prevention + collaboration with podiatry.      Relevant Orders   Comprehensive metabolic panel   Lipid Panel w/o Chol/HDL Ratio   Pulmonary hypertension (HCC) (Chronic)    Pulmonary artery enlargement noted on CT lung screening, ?pulmonary arterial hypertension.  At this time continue collaboration with cardiology and current medication regimen.  Consider involvement of pulmonary in future, he does not want referral at this time. Repeat lung screening upcoming will determine if ongoing present on screening and continue to recommend pulmonary if so.        Respiratory   Centrilobular emphysema (HCC) (Chronic)    Chronic, stable with minimal use of Albuterol.  PFT April 2024 - FEV1 54% and FEV1/FVC 59%, change from in 2022 was FEV1 64% and FEV1/FVC 73%, however had a cough at time of testing -- would benefit repeat in future.  Continue to monitor and adjust regimen as needed.  Continue Albuterol  and recommend he use this if wheezing present or SOB.  Continue cessation of smoking and continue yearly lung screening.  Spirometry annually.  Have recommend a daily inhaler for maintenance and to help lung function, he refuses.        Endocrine   Controlled type 2 diabetes with neuropathy (HCC) - Primary (Chronic)    Chronic, ongoing.  A1c 8.5%, trend up from previous 7.6%. Urine ALB 30 April 2023. Continue Metformin and Farxiga as ordered, but discussed with him at length need to add on another medication to help lower levels.  We discussed GLP1 and DPP4.  At this time will trial Januvia 100 MG daily, prefers this over GLP1 due to concern for weight loss and he is already lower BMI.  Educated on [place], recent kidney function stable.  Recommend heavy focus on diet with less pasta and potatoes.  Continue Gabapentin as prescribed by podiatry and adjust as needed based on renal function.  Check BS BID and document for next visit.  Return in 4 weeks, A1c repeat 3 months. - Eye and foot exams up to date - Vaccinations up to date - Statin on board, allergic to ACE      Relevant Medications   sitaGLIPtin (JANUVIA) 100 MG tablet   Other Relevant Orders   Bayer DCA Hb A1c Waived   Comprehensive metabolic panel   Hyperlipidemia associated with type 2 diabetes mellitus (HCC) (Chronic)    Chronic, ongoing.  Continue current medication regimen and adjust as needed.  Lipid panel today.  Recent LDL <70.      Relevant Medications   sitaGLIPtin (JANUVIA) 100 MG tablet   Other Relevant Orders   Bayer DCA Hb A1c Waived   Comprehensive metabolic panel   Lipid Panel w/o Chol/HDL Ratio     Follow up plan: Return in about 4 weeks (around 08/14/2023) for T2DM -- added on Januvia  + 3 month for T2DM, HTN/HLD, COPD.

## 2023-07-17 NOTE — Assessment & Plan Note (Signed)
 Chronic, ongoing.  Continue current medication regimen and adjust as needed.  Lipid panel today.  Recent LDL <70.

## 2023-07-17 NOTE — Assessment & Plan Note (Signed)
Chronic.  Continue collaboration with cardiology and medication regimen as ordered.  No recent NTG use. 

## 2023-07-17 NOTE — Assessment & Plan Note (Addendum)
Chronic, stable.  Euvolemic on exam today.  Continue collaboration with cardiology and current medication regimen as ordered by them, recent notes and testing reviewed.  On Farxiga for prevention.  Recommend: - Reminded to call for an overnight weight gain of >2 pounds or a weekly weight weight of >5 pounds - not adding salt to his food and has been reading food labels. Reviewed the importance of keeping daily sodium intake to 2000mg  daily  - Avoid Ibuprofen medications

## 2023-07-17 NOTE — Assessment & Plan Note (Signed)
Chronic, stable.  BP at goal in office and on home readings.  Continue current medication regimen and adjust as needed.  Continue collaboration with cardiology, recent notes reviewed.  Recommend he monitor BP at least a few times a week at home and document.  DASH diet focus.  LABS: CMP.  Urine ALB 30 April 2023.

## 2023-07-17 NOTE — Assessment & Plan Note (Signed)
Chronic, ongoing.  A1c 8.5%, trend up from previous 7.6%. Urine ALB 30 April 2023. Continue Metformin and Farxiga as ordered, but discussed with him at length need to add on another medication to help lower levels.  We discussed GLP1 and DPP4.  At this time will trial Januvia 100 MG daily, prefers this over GLP1 due to concern for weight loss and he is already lower BMI.  Educated on [place], recent kidney function stable.  Recommend heavy focus on diet with less pasta and potatoes.  Continue Gabapentin as prescribed by podiatry and adjust as needed based on renal function.  Check BS BID and document for next visit.  Return in 4 weeks, A1c repeat 3 months. - Eye and foot exams up to date - Vaccinations up to date - Statin on board, allergic to ACE

## 2023-07-17 NOTE — Assessment & Plan Note (Addendum)
Followed by cardiology, continue this collaboration.  Recent notes and testing reviewed.

## 2023-07-17 NOTE — Assessment & Plan Note (Signed)
Chronic, stable.  Continue statin and ASA daily at home for prevention + collaboration with podiatry.

## 2023-07-18 LAB — COMPREHENSIVE METABOLIC PANEL
ALT: 22 IU/L (ref 0–44)
AST: 19 IU/L (ref 0–40)
Albumin: 4.9 g/dL — ABNORMAL HIGH (ref 3.8–4.8)
Alkaline Phosphatase: 60 IU/L (ref 44–121)
BUN/Creatinine Ratio: 19 (ref 10–24)
BUN: 23 mg/dL (ref 8–27)
Bilirubin Total: 0.4 mg/dL (ref 0.0–1.2)
CO2: 22 mmol/L (ref 20–29)
Calcium: 9.7 mg/dL (ref 8.6–10.2)
Chloride: 105 mmol/L (ref 96–106)
Creatinine, Ser: 1.2 mg/dL (ref 0.76–1.27)
Globulin, Total: 1.8 g/dL (ref 1.5–4.5)
Glucose: 108 mg/dL — ABNORMAL HIGH (ref 70–99)
Potassium: 4.1 mmol/L (ref 3.5–5.2)
Sodium: 145 mmol/L — ABNORMAL HIGH (ref 134–144)
Total Protein: 6.7 g/dL (ref 6.0–8.5)
eGFR: 64 mL/min/{1.73_m2} (ref >59)

## 2023-07-18 LAB — LIPID PANEL W/O CHOL/HDL RATIO
Cholesterol, Total: 108 mg/dL (ref 100–199)
HDL: 30 mg/dL — ABNORMAL LOW (ref >39)
LDL Chol Calc (NIH): 59 mg/dL (ref 0–99)
Triglycerides: 100 mg/dL (ref 0–149)
VLDL Cholesterol Cal: 19 mg/dL (ref 5–40)

## 2023-07-18 NOTE — Progress Notes (Signed)
Good morning, please let Davius know his labs have returned and these remain overall stable with exception of salt (sodium) level which is a little elevated.  Recommend he reduce salt in diet and take in a little more water daily, although not too much with his heart failure.  Continue all current medications.  No changes needed.  Any questions? Keep being stellar!!  Thank you for allowing me to participate in your care.  I appreciate you. Kindest regards, Azelie Noguera

## 2023-07-23 ENCOUNTER — Telehealth: Payer: Self-pay | Admitting: *Deleted

## 2023-07-23 NOTE — Telephone Encounter (Signed)
Patient notified:  Good morning, please let Brendan [name] know his labs have returned and these remain overall stable with exception of salt (sodium) level which is a little elevated.  Recommend he reduce salt in diet and take in a little more water daily, although not too much with his heart failure.  Continue all current medications.  No changes needed.  Any questions? Keep being stellar!!  Thank you for allowing me to participate in your care.  I appreciate you. Kindest regards, Jolene

## 2023-08-06 ENCOUNTER — Telehealth: Payer: Self-pay | Admitting: Nurse Practitioner

## 2023-08-06 NOTE — Telephone Encounter (Signed)
Patient called about his paperwork for ordering diabetic shoes. Patient stated that he called his foot doctor (Dr. Al Corpus) and they stated that they have not received any paperwork yet and per patient, Dr. Laural Benes was supposed to get this sent for him. Patient stated he has been waiting a month now and needs this done.  Patient wants a call back regarding this, please update patient on the status of this and document.   Patient's callback # [phone]

## 2023-08-06 NOTE — Telephone Encounter (Signed)
Reviewed the patient's chart. Patient was seen by Dr. Laural Benes for diabetic shoes 06/04/23. I do not see any paperwork regarding diabetic shoes scanned into the patient's chart.   Call placed to [facility] in [place]. Explained the situation to them. They are sending the paperwork over for [place] to complete for the patient. Will complete the forms and have Dr. Laural Benes sign when forms are received.

## 2023-08-07 ENCOUNTER — Ambulatory Visit: Payer: Medicare HMO | Attending: Physician Assistant

## 2023-08-07 ENCOUNTER — Ambulatory Visit: Payer: Medicare HMO

## 2023-08-07 DIAGNOSIS — I77810 Thoracic aortic ectasia: Secondary | ICD-10-CM | POA: Diagnosis not present

## 2023-08-07 DIAGNOSIS — I6523 Occlusion and stenosis of bilateral carotid arteries: Secondary | ICD-10-CM

## 2023-08-07 DIAGNOSIS — I5030 Unspecified diastolic (congestive) heart failure: Secondary | ICD-10-CM | POA: Diagnosis not present

## 2023-08-07 DIAGNOSIS — I517 Cardiomegaly: Secondary | ICD-10-CM | POA: Diagnosis not present

## 2023-08-07 NOTE — Telephone Encounter (Signed)
The patient called back and was very appreciative that his order was signed and sent in to [facility] by his provider for diabetic shoes

## 2023-08-07 NOTE — Telephone Encounter (Signed)
Forms received and signed by Dr. Laural Benes. Forms faxed back along with office visit note to [facility].   Tried calling patient to let him know that this was done, no answer and VM not set up. Will try to call again.   OK for [facility] to notify patient of the above if he calls back.

## 2023-08-08 NOTE — Telephone Encounter (Signed)
Noted  

## 2023-08-09 ENCOUNTER — Ambulatory Visit
Admission: RE | Admit: 2023-08-09 | Discharge: 2023-08-09 | Disposition: A | Discharge status: Home / Self Care | Payer: Medicare HMO | Source: Ambulatory Visit | Attending: Acute Care | Admitting: Acute Care

## 2023-08-09 DIAGNOSIS — Z122 Encounter for screening for malignant neoplasm of respiratory organs: Secondary | ICD-10-CM | POA: Insufficient documentation

## 2023-08-09 DIAGNOSIS — Z87891 Personal history of nicotine dependence: Secondary | ICD-10-CM | POA: Diagnosis not present

## 2023-08-09 DIAGNOSIS — F17210 Nicotine dependence, cigarettes, uncomplicated: Secondary | ICD-10-CM | POA: Diagnosis not present

## 2023-08-10 NOTE — Patient Instructions (Signed)
Diabetes Mellitus Basics  Diabetes mellitus, or diabetes, is a long-term (chronic) disease. It occurs when the body does not properly use sugar (glucose) that is released from food after you eat. Diabetes mellitus may be caused by one or both of these problems: Your pancreas does not make enough of a hormone called insulin. Your body does not react in a normal way to the insulin that it makes. Insulin lets glucose enter cells in your body. This gives you energy. If you have diabetes, glucose cannot get into cells. This causes high blood glucose (hyperglycemia). How to treat and manage diabetes You may need to take insulin or other diabetes medicines daily to keep your glucose in balance. If you are prescribed insulin, you will learn how to give yourself insulin by injection. You may need to adjust the amount of insulin you take based on the foods that you eat. You will need to check your blood glucose levels using a glucose monitor as told by your health care provider. The readings can help determine if you have low or high blood glucose. Generally, you should have these blood glucose levels: Before meals (preprandial): 80-130 mg/dL (4.4-7.2 mmol/L). After meals (postprandial): below 180 mg/dL (10 mmol/L). Hemoglobin A1c (HbA1c) level: less than 7%. Your health care provider will set treatment goals for you. Keep all follow-up visits. This is important. Follow these instructions at home: Diabetes medicines Take your diabetes medicines every day as told by your health care provider. List your diabetes medicines here: Name of medicine: ______________________________ Amount (dose): _______________ Time (a.m./p.m.): _______________ Notes: ___________________________________ Name of medicine: ______________________________ Amount (dose): _______________ Time (a.m./p.m.): _______________ Notes: ___________________________________ Name of medicine: ______________________________ Amount (dose):  _______________ Time (a.m./p.m.): _______________ Notes: ___________________________________ Insulin If you use insulin, list the types of insulin you use here: Insulin type: ______________________________ Amount (dose): _______________ Time (a.m./p.m.): _______________Notes: ___________________________________ Insulin type: ______________________________ Amount (dose): _______________ Time (a.m./p.m.): _______________ Notes: ___________________________________ Insulin type: ______________________________ Amount (dose): _______________ Time (a.m./p.m.): _______________ Notes: ___________________________________ Insulin type: ______________________________ Amount (dose): _______________ Time (a.m./p.m.): _______________ Notes: ___________________________________ Insulin type: ______________________________ Amount (dose): _______________ Time (a.m./p.m.): _______________ Notes: ___________________________________ Managing blood glucose  Check your blood glucose levels using a glucose monitor as told by your health care provider. Write down the times that you check your glucose levels here: Time: _______________ Notes: ___________________________________ Time: _______________ Notes: ___________________________________ Time: _______________ Notes: ___________________________________ Time: _______________ Notes: ___________________________________ Time: _______________ Notes: ___________________________________ Time: _______________ Notes: ___________________________________  Low blood glucose Low blood glucose (hypoglycemia) is when glucose is at or below 70 mg/dL (3.9 mmol/L). Symptoms may include: Feeling: Hungry. Sweaty and clammy. Irritable or easily upset. Dizzy. Sleepy. Having: A fast heartbeat. A headache. A change in your vision. Numbness around the mouth, lips, or tongue. Having trouble with: Moving (coordination). Sleeping. Treating low blood glucose To treat low blood  glucose, eat or drink something containing sugar right away. If you can think clearly and swallow safely, follow the 15:15 rule: Take 15 grams of a fast-acting carb (carbohydrate), as told by your health care provider. Some fast-acting carbs are: Glucose tablets: take 3-4 tablets. Hard candy: eat 3-5 pieces. Fruit juice: drink 4 oz (120 mL). Regular (not diet) soda: drink 4-6 oz (120-180 mL). Honey or sugar: eat 1 Tbsp (15 mL). Check your blood glucose levels 15 minutes after you take the carb. If your glucose is still at or below 70 mg/dL (3.9 mmol/L), take 15 grams of a carb again. If your glucose does not go above 70 mg/dL (3.9 mmol/L) after   3 tries, get help right away. After your glucose goes back to normal, eat a meal or a snack within 1 hour. Treating very low blood glucose If your glucose is at or below 54 mg/dL (3 mmol/L), you have very low blood glucose (severe hypoglycemia). This is an emergency. Do not wait to see if the symptoms will go away. Get medical help right away. Call your local emergency services (911 in the [place] Do not drive yourself to the hospital. Questions to ask your health care provider Should I talk with a diabetes educator? What equipment will I need to care for myself at home? What diabetes medicines do I need? When should I take them? How often do I need to check my blood glucose levels? What number can I call if I have questions? When is my follow-up visit? Where can I find a support group for people with diabetes? Where to find more information American Diabetes Association: www.diabetes.org Association of Diabetes Care and Education Specialists: www.diabeteseducator.org Contact a health care provider if: Your blood glucose is at or above 240 mg/dL (13.3 mmol/L) for 2 days in a row. You have been sick or have had a fever for 2 days or more, and you are not getting better. You have any of these problems for more than 6 hours: You cannot eat or  drink. You feel nauseous. You vomit. You have diarrhea. Get help right away if: Your blood glucose is lower than 54 mg/dL (3 mmol/L). You get confused. You have trouble thinking clearly. You have trouble breathing. These symptoms may represent a serious problem that is an emergency. Do not wait to see if the symptoms will go away. Get medical help right away. Call your local emergency services (911 in the [place] Do not drive yourself to the hospital. Summary Diabetes mellitus is a chronic disease that occurs when the body does not properly use sugar (glucose) that is released from food after you eat. Take insulin and diabetes medicines as told. Check your blood glucose every day, as often as told. Keep all follow-up visits. This is important. This information is not intended to replace advice given to you by your health care provider. Make sure you discuss any questions you have with your health care provider. Document Revised: 04/19/2020 Document Reviewed: 04/19/2020 Elsevier Patient Education  2024 [employer]  

## 2023-08-14 ENCOUNTER — Ambulatory Visit (INDEPENDENT_AMBULATORY_CARE_PROVIDER_SITE_OTHER): Payer: Medicare HMO | Admitting: Nurse Practitioner

## 2023-08-14 ENCOUNTER — Encounter: Payer: Self-pay | Admitting: Nurse Practitioner

## 2023-08-14 VITALS — BP 125/68 | HR 64 | Temp 97.9°F | Wt 195.4 lb

## 2023-08-14 DIAGNOSIS — M542 Cervicalgia: Secondary | ICD-10-CM

## 2023-08-14 DIAGNOSIS — Z7984 Long term (current) use of oral hypoglycemic drugs: Secondary | ICD-10-CM | POA: Diagnosis not present

## 2023-08-14 DIAGNOSIS — E1140 Type 2 diabetes mellitus with diabetic neuropathy, unspecified: Secondary | ICD-10-CM

## 2023-08-14 DIAGNOSIS — Z0000 Encounter for general adult medical examination without abnormal findings: Secondary | ICD-10-CM

## 2023-08-14 MED ORDER — TIZANIDINE HCL 4 MG PO TABS: 4.0000 mg | ORAL_TABLET | Freq: Four times a day (QID) | ORAL | 1 refills | Status: AC | PRN

## 2023-08-14 NOTE — Progress Notes (Signed)
I made the appointment for him and will let him know when he come to his next appt

## 2023-08-14 NOTE — Assessment & Plan Note (Signed)
Chronic, ongoing.  A1c 8.5% in July 2024, trend up from previous 7.6%. Urine ALB 30 April 2023. Continue Metformin, Farxiga, and Januvia (tolerating this well and noticing reduction in BS levels -- adjust based on kidney function as needed).  Recommend heavy focus on diet with less pasta and potatoes.  Continue Gabapentin as prescribed by podiatry and adjust as needed based on renal function.  Check BS BID and document for next visit.  Return in October for A1c check. - Eye and foot exams up to date - Vaccinations up to date - Statin on board, allergic to ACE

## 2023-08-14 NOTE — Assessment & Plan Note (Signed)
Acute since this morning.  No rigidity and able to move well, but he does have discomfort with this.  No red flags.  Will send in Tizanidine 4 MG Q6H to use as needed + recommend he use Lidocaine patches he has at home.  May use Voltaren gel or Tylenol as needed.  Avoid Ibuprofen oral medication.  Utilize heat pad.  Return if worsening or ongoing.

## 2023-08-14 NOTE — Progress Notes (Signed)
BP 125/68   Pulse 64   Temp 97.9 F (36.6 C) (Oral)   Wt 195 lb 6.4 oz (88.6 kg)   SpO2 93%   BMI 24.42 kg/m    Subjective:    Patient ID: Brendan [name], male    DOB: 1951/02/13, 72 y.o.   MRN: [id number]  HPI: Brendan [name] is a 72 y.o. male  Chief Complaint  Patient presents with   Diabetes    4 week f/up   Neck Pain    Pt states he has been having pain in the R side of his neck and into his shoulder since yesterday. States he thinks he may have pulled a muscle.    DIABETES A1c recently in July was 8.5%.  We decided to maintain Metformin and Farxiga as ordered, but added on Januvia 100 MG daily.  This was patient preference as he prefers to avoid injections and weight loss.  Has been lightening up on potato chips.  Tolerating Januvia without ADR and feels sugars are better. Hypoglycemic episodes:no Polydipsia/polyuria: no Visual disturbance: no Chest pain: no Paresthesias: no Glucose Monitoring: yes  Accucheck frequency: every other day  Fasting glucose: 80 to 118 -- improving  Post prandial:  Evening:  Before meals: Taking Insulin?: no  Long acting insulin:  Short acting insulin: Blood Pressure Monitoring: a few times a week Retinal Examination: Up to Date Foot Exam: Up to Date Pneumovax: Up to Date Influenza: Up to Date Aspirin: yes   NECK PAIN  Has been hurting to right side since this morning, thinks he strained muscle.  To same side where he has shoulder issues.  Mopping a lot at work.  Status: stable Treatments attempted: nothing  Relief with NSAIDs?:  No NSAIDs Taken Location:Right Duration:days Severity: 8/10 Quality: dull, aching, and throbbing Frequency: intermittent Radiation: none Aggravating factors: lifting and movement Alleviating factors: nothing Weakness:  no Paresthesias / decreased sensation:  no  Fevers:  no   Relevant past medical, surgical, family and social history reviewed and updated as indicated. Interim medical history since  our last visit reviewed. Allergies and medications reviewed and updated.  Review of Systems  Constitutional:  Negative for activity change, diaphoresis, fatigue and fever.  Respiratory:  Negative for cough, chest tightness, shortness of breath and wheezing.   Cardiovascular:  Negative for chest pain, palpitations and leg swelling.  Gastrointestinal: Negative.   Endocrine: Negative for cold intolerance, heat intolerance, polydipsia, polyphagia and polyuria.  Musculoskeletal:  Positive for neck pain.  Neurological: Negative.   Psychiatric/Behavioral: Negative.      Per HPI unless specifically indicated above     Objective:    BP 125/68   Pulse 64   Temp 97.9 F (36.6 C) (Oral)   Wt 195 lb 6.4 oz (88.6 kg)   SpO2 93%   BMI 24.42 kg/m   Wt Readings from Last 3 Encounters:  08/14/23 195 lb 6.4 oz (88.6 kg)  07/17/23 194 lb 9.6 oz (88.3 kg)  06/17/23 196 lb 6.4 oz (89.1 kg)    Physical Exam Vitals and nursing note reviewed.  Constitutional:      General: He is awake. He is not in acute distress.    Appearance: He is well-developed and overweight. He is not ill-appearing.  HENT:     Head: Normocephalic and atraumatic.     Right Ear: Hearing normal. No drainage.     Left Ear: Hearing normal. No drainage.  Eyes:     General: Lids are normal.  Right eye: No discharge.        Left eye: No discharge.     Conjunctiva/sclera: Conjunctivae normal.     Pupils: Pupils are equal, round, and reactive to light.  Neck:     Thyroid: No thyromegaly.     Vascular: No carotid bruit.     Comments: No rashes noted Cardiovascular:     Rate and Rhythm: Normal rate and regular rhythm.     Heart sounds: Normal heart sounds, S1 normal and S2 normal. No murmur heard.    No gallop.  Pulmonary:     Effort: Pulmonary effort is normal. No accessory muscle usage or respiratory distress.     Breath sounds: Normal breath sounds. No decreased breath sounds, wheezing or rhonchi.  Abdominal:      General: Bowel sounds are normal. There is no distension.     Palpations: Abdomen is soft.     Tenderness: There is no abdominal tenderness.  Musculoskeletal:     Cervical back: Normal range of motion and neck supple. No edema, erythema, rigidity, torticollis or crepitus. Pain with movement (with lateral movement and downward) and muscular tenderness (lower neck) present. No spinous process tenderness. Normal range of motion.     Right lower leg: No edema.     Left lower leg: No edema.  Skin:    General: Skin is warm and dry.  Neurological:     Mental Status: He is alert and oriented to person, place, and time.  Psychiatric:        Attention and Perception: Attention normal.        Mood and Affect: Mood normal.        Speech: Speech normal.        Behavior: Behavior normal. Behavior is cooperative.    Results for orders placed or performed in visit on 08/07/23  ECHOCARDIOGRAM COMPLETE  Result Value Ref Range   AR max vel 3.43 cm2   AV Peak grad 3.9 mmHg   Ao pk vel 0.99 m/s   S' Lateral 3.60 cm   Area-P 1/2 3.03 cm2   AV Area VTI 3.41 cm2   AV Mean grad 1.8 mmHg   Single Plane A4C EF 49.3 %   Single Plane A2C EF 47.5 %   Calc EF 48.6 %   AV Area mean vel 3.29 cm2   Est EF 50 - 55%       Assessment & Plan:   Problem List Items Addressed This Visit       Endocrine   Controlled type 2 diabetes with neuropathy (HCC) - Primary (Chronic)    Chronic, ongoing.  A1c 8.5% in July 2024, trend up from previous 7.6%. Urine ALB 30 April 2023. Continue Metformin, Farxiga, and Januvia (tolerating this well and noticing reduction in BS levels -- adjust based on kidney function as needed).  Recommend heavy focus on diet with less pasta and potatoes.  Continue Gabapentin as prescribed by podiatry and adjust as needed based on renal function.  Check BS BID and document for next visit.  Return in October for A1c check. - Eye and foot exams up to date - Vaccinations up to date - Statin on  board, allergic to ACE        Other   Neck pain    Acute since this morning.  No rigidity and able to move well, but he does have discomfort with this.  No red flags.  Will send in Tizanidine 4 MG Q6H to use as needed +  recommend he use Lidocaine patches he has at home.  May use Voltaren gel or Tylenol as needed.  Avoid Ibuprofen oral medication.  Utilize heat pad.  Return if worsening or ongoing.        Follow up plan: Return in about 2 months (around 10/18/2023) for T2DM, HTN/HLD, HF, COPD.

## 2023-08-14 NOTE — Progress Notes (Signed)
Appointment has been scheduled.

## 2023-08-15 ENCOUNTER — Other Ambulatory Visit: Payer: Self-pay | Admitting: Acute Care

## 2023-08-15 DIAGNOSIS — Z122 Encounter for screening for malignant neoplasm of respiratory organs: Secondary | ICD-10-CM

## 2023-08-15 DIAGNOSIS — Z87891 Personal history of nicotine dependence: Secondary | ICD-10-CM

## 2023-08-15 NOTE — Progress Notes (Unsigned)
Cardiology Office Note    Date:  08/19/2023   ID:  Brendan [name], DOB 18-Jan-1951, MRN [id number]  PCP:  Marjie Skiff, NP  Cardiologist:  Yvonne Kendall, MD  Electrophysiologist:  None   Chief Complaint: Follow-up  History of Present Illness:   Brendan [name] is a 72 y.o. male with history of CAD status post four-vessel CABG in 06/2017 with LIMA to LAD, SVG to D2, SVG to OM, SVG to PDA, postoperative A. fib, carotid artery stenosis, DM2, orthostatic dizziness, HTN, HLD, COPD secondary to tobacco use who presents for follow-up of Lexiscan MPI, carotid artery ultrasound, and echo.  He underwent diagnostic LHC in 05/2017 for exertional angina which showed three-vessel CAD. This was followed by four-vessel CABG on 07/02/2017 with postoperative course being complicated by pneumothorax requiring chest tube placement along with postoperative A. fib treated with a short course of amiodarone. There has been no documented evidence of recurrence of A. fib.  Preoperative carotid artery ultrasound in 2018 showed 1 to 39% right ICA and 40 to 59% left ICA stenosis.  Echo in 03/2020 demonstrated an EF of 60 to 65%, normal LV diastolic function parameters, mildly reduced RV systolic function with normal ventricular cavity size, no significant valvular abnormalities, and a mildly dilated aortic root measuring 41 mm.  Lexiscan MPI in 04/2020 showed no evidence of ischemia with an EF of 55 to 65%, and was low risk.  Previously, orthostatic dizziness did not improve with de-escalation of diuretic therapy.   He was evaluated in the ED on 05/28/2023 with dizziness that occurred when bending down and improved after standing up for 2 to 3 minutes and was associated with chest tightness high-sensitivity troponin negative x 2.  Chest x-ray with hyperinflation with chronic lung changes without consolidation or effusion.  EKG showed sinus rhythm with sinus arrhythmia with nonspecific ST-T changes.  Blood pressure was elevated in the  150s to 160s systolic in the ED.  ER queried possible inner ear issue.  He followed up with his PCP on 06/04/2023 and reported feeling much better since his ED evaluation.  He was last seen in our office in 06/2023 noting intermittent bilateral chest tightness that felt like a "pulled muscle."  He also continue to note longstanding positional dizziness, though had not had any further dizziness since his episode leading to his ED visit in 05/2023.  He was mildly orthostatic at that visit.  Lexiscan MPI in 05/2020 showed no evidence of ischemia with an EF of 66% and was overall low risk.  CT attenuated corrected images showed severe three-vessel coronary calcification and mild aortic atherosclerosis.  Carotid artery ultrasound in 08/2023 showed 1 to 39% bilateral ICA stenosis.  Echo in 08/2023 showed an EF of 50 to 55%, grade 2 diastolic dysfunction, normal RV systolic function with mildly enlarged ventricular cavity size, mildly dilated right atrium, and no significant valvular abnormalities.  He comes in doing very well from a cardiac perspective and is without symptoms of angina or cardiac decompensation.  No further dizziness, including orthostatic dizziness.  No lower extremity swelling, progressive orthopnea, presyncope, or syncope.  He has not needed any as needed SL NTG.  His weight is stable when compared to our scale.  He does not have any acute cardiac concerns at this time.   Labs independently reviewed: 07/2023 - TC 108, TG 100, HDL 30, LDL 59, BUN 23, serum creatinine 1.2, potassium 4.1, albumin 4.9, AST/ALT normal, A1c 8.5 05/2023 - Hgb 16.9, PLT 179  04/2023 -  TSH normal   Past Medical History:  Diagnosis Date   CAD (coronary artery disease)    a. 05/2016 Lexi MV: EF 58%, no ischemia/infarct; b. 05/2017 Cath: LM 20-30, LAD 50p, 48m, D1 50p, D2 80ost, LCX 90/70ost, RCA 80/90p/m, RPL1 50p, EF 50-55%; c. 06/2017 CABG x 4 (LIMA->LAD, VG->D2,VG->OM, VG->PDA).   Diabetes mellitus without complication  (HCC)    Diastolic dysfunction    a. 05/2016 Echo: EF 55%, Gr1 DD, mild TR/MR.   Hyperlipidemia    Hypertension    Hypokalemia    Personal history of tobacco use, presenting hazards to health 05/03/2016   Post-op Afib    a. 06/2017 following CABG->short course of amio.    Past Surgical History:  Procedure Laterality Date   BACK SURGERY     CARDIAC CATHETERIZATION     CORONARY ARTERY BYPASS GRAFT N/A 07/01/2017   Procedure: CORONARY ARTERY BYPASS GRAFTING (CABG) x4, using left mammary artery and right greater saphenous vein harvested endoscopically;  Surgeon: Kerin Perna, MD;  Location: [facility] OR;  Service: Open Heart Surgery;  Laterality: N/A;   KNEE SURGERY     LEFT HEART CATH AND CORONARY ANGIOGRAPHY N/A 06/04/2017   Procedure: Left Heart Cath and Coronary Angiography;  Surgeon: Yvonne Kendall, MD;  Location: [facility] INVASIVE CV LAB;  Service: Cardiovascular;  Laterality: N/A;   TEE WITHOUT CARDIOVERSION N/A 07/01/2017   Procedure: TRANSESOPHAGEAL ECHOCARDIOGRAM (TEE);  Surgeon: Donata Clay, Theron Arista, MD;  Location: [facility] OR;  Service: Open Heart Surgery;  Laterality: N/A;    Current Medications: Current Meds  Medication Sig   acetaminophen (TYLENOL) 500 MG tablet Take 1 tablet (500 mg total) by mouth every 6 (six) hours as needed for mild pain or headache.   albuterol (VENTOLIN HFA) 108 (90 Base) MCG/ACT inhaler Inhale 2 puffs into the lungs every 6 (six) hours as needed for wheezing or shortness of breath.   amLODipine (NORVASC) 5 MG tablet Take 1 tablet (5 mg total) by mouth daily.   aspirin EC 81 MG tablet Take 1 tablet (81 mg total) by mouth daily.   atorvastatin (LIPITOR) 20 MG tablet Take 1 tablet (20 mg total) by mouth daily.   azelastine (ASTELIN) 0.1 % nasal spray Place 2 sprays into both nostrils at bedtime.   chlorthalidone (HYGROTON) 25 MG tablet Take 1 tablet (25 mg total) by mouth daily.   cyanocobalamin 1000 MCG tablet Take 1,000 mcg by mouth every other day.   dapagliflozin  propanediol (FARXIGA) 10 MG TABS tablet Take 1 tablet (10 mg total) by mouth daily before breakfast.   gabapentin (NEURONTIN) 300 MG capsule Take one capsule at lunchtime and two capsules at bedtime   lidocaine (LIDODERM) 5 % Place 1 patch onto the skin daily. Remove & Discard patch within 12 hours or as directed by MD   loratadine (CLARITIN) 10 MG tablet Take 1 tablet (10 mg total) by mouth daily.   metFORMIN (GLUCOPHAGE) 1000 MG tablet Take 1 tablet (1,000 mg total) by mouth 2 (two) times daily with a meal.   metoprolol tartrate (LOPRESSOR) 25 MG tablet Take 0.5 tablets (12.5 mg total) by mouth 2 (two) times daily.   montelukast (SINGULAIR) 10 MG tablet Take 1 tablet (10 mg total) by mouth at bedtime.   nitroGLYCERIN (NITROSTAT) 0.4 MG SL tablet Place 1 tablet (0.4 mg total) under the tongue every 5 (five) minutes as needed for chest pain.   potassium chloride SA (KLOR-CON M) 20 MEQ tablet Take 1 tablet (20 mEq total) by mouth daily.  sildenafil (REVATIO) 20 MG tablet TAKE 1 TO 5 TABLETS AS NEEDED.   sitaGLIPtin (JANUVIA) 100 MG tablet Take 1 tablet (100 mg total) by mouth daily.   tamsulosin (FLOMAX) 0.4 MG CAPS capsule Take 1 capsule (0.4 mg total) by mouth daily.   tiZANidine (ZANAFLEX) 4 MG tablet Take 1 tablet (4 mg total) by mouth every 6 (six) hours as needed for muscle spasms.    Allergies:   Lisinopril   Social History   Socioeconomic History   Marital status: Widowed    Spouse name: Not on file   Number of children: Not on file   Years of education: Not on file   Highest education level: 10th grade  Occupational History   Occupation: works part time as Copy   Tobacco Use   Smoking status: Former    Current packs/day: 0.00    Average packs/day: 1 pack/day for 60.0 years (60.0 ttl pk-yrs)    Types: Cigarettes    Start date: 06/29/1957    Quit date: 06/29/2017    Years since quitting: 6.1   Smokeless tobacco: Never  Vaping Use   Vaping status: Never Used  Substance  and Sexual Activity   Alcohol use: No    Comment: previously drank heavily but quit ~ 21 yrs ago.   Drug use: No   Sexual activity: Yes  Other Topics Concern   Not on file  Social History Narrative   Lives in [place] by himself.  Works as Copy.  Does not routinely exercise.   Social Determinants of Health   Financial Resource Strain: Low Risk  (04/25/2022)   Overall Financial Resource Strain (CARDIA)    Difficulty of Paying Living Expenses: Not hard at all  Food Insecurity: No Food Insecurity (04/25/2022)   Hunger Vital Sign    Worried About Running Out of Food in the Last Year: Never true    Ran Out of Food in the Last Year: Never true  Transportation Needs: No Transportation Needs (04/25/2022)   PRAPARE - Administrator, Civil Service (Medical): No    Lack of Transportation (Non-Medical): No  Physical Activity: Unknown (04/25/2022)   Exercise Vital Sign    Days of Exercise per Week: 5 days    Minutes of Exercise per Session: Not on file  Stress: No Stress Concern Present (04/25/2022)   [employer] of Occupational Health - Occupational Stress Questionnaire    Feeling of Stress : Not at all  Social Connections: Moderately Isolated (04/25/2022)   Social Connection and Isolation Panel [NHANES]    Frequency of Communication with Friends and Family: Once a week    Frequency of Social Gatherings with Friends and Family: Three times a week    Attends Religious Services: 1 to 4 times per year    Active Member of Clubs or Organizations: No    Attends Banker Meetings: Never    Marital Status: Widowed     Family History:  The patient's family history includes Diabetes in his mother; Heart disease (age of onset: 59) in his father; Hypertension in his mother.  ROS:   12-point review of systems is negative unless otherwise noted in the HPI.   EKGs/Labs/Other Studies Reviewed:    Studies reviewed were summarized above. The additional studies were reviewed  today:  LHC 05/2017: Conclusions: Severe, three vessel coronary artery disease with diffuse calcification, as detailed below. Basal and mid inferior hypokinesis with otherwise preserved left ventricular contraction (LVEF 50-55%). Normal left ventricular filling pressure.  Recommendations: Outpatient cardiac surgery evaluation given severe three-vessel, diabetes mellitus, and continued exertional angina despite aggressive medical therapy. Continue secondary prevention. __________   TEE 06/2017:  Left ventricle: Normal cavity size, wall thickness, left ventricular  diastolic function and left atrial pressure. LV systolic function is low  normal with an EF of 50-55%. No thrombus present. No mass present.   Mitral valve: Trace regurgitation.   Right ventricle: Normal cavity size, wall thickness and ejection  fraction.   Tricuspid valve: Trace regurgitation. __________   2D echo was 04/12/2020: 1. Left ventricular ejection fraction, by estimation, is 60 to 65%. The  left ventricle has normal function. Left ventricular endocardial border  not optimally defined to evaluate regional wall motion. Left ventricular  diastolic parameters were normal.   2. Right ventricular systolic function is mildly reduced. The right  ventricular size is normal.   3. The mitral valve is grossly normal. No evidence of mitral valve  regurgitation. No evidence of mitral stenosis.   4. The aortic valve was not well visualized. Aortic valve regurgitation  is not visualized. No aortic stenosis is present.   5. Pulmonic valve regurgitation not well assessed.   6. Aortic dilatation noted. There is mild dilatation of the aortic root  measuring 41 mm.  __________   Eugenie Birks MPI 05/02/2020: T wave inversion was noted during stress in the V3, V4 and V5 leads. There was no ST segment deviation noted during stress. The study is normal. This is a low risk study. The left ventricular ejection fraction is normal  (55-65%). __________  Eugenie Birks MPI 06/21/2023: Pharmacological myocardial perfusion imaging study with no significant  ischemia Normal wall motion, EF estimated at 66% No EKG changes concerning for ischemia at peak stress or in recovery. CT attenuation correction images with mild aortic atherosclerosis, severe three-vessel coronary calcification Low risk scan __________  Carotid artery ultrasound 08/07/2023: Summary:  Right Carotid: Velocities in the right ICA are consistent with a 1-39% stenosis.   Left Carotid: Velocities in the left ICA are consistent with a 1-39% stenosis.   Vertebrals: Bilateral vertebral arteries demonstrate antegrade flow.  Subclavians: Normal flow hemodynamics were seen in bilateral subclavian arteries.  __________  2D echo 08/07/2023: 1. Left ventricular ejection fraction, by estimation, is 50 to 55%. The  left ventricle has low normal function. Left ventricular endocardial  border not optimally defined to evaluate regional wall motion. There is  mild left ventricular hypertrophy. Left  ventricular diastolic parameters are consistent with Grade II diastolic  dysfunction (pseudonormalization).   2. Right ventricular systolic function is normal. The right ventricular  size is mildly enlarged.   3. Right atrial size was mildly dilated.   4. The mitral valve is grossly normal. No evidence of mitral valve  regurgitation.   5. The aortic valve was not well visualized. Aortic valve regurgitation  is not visualized. No aortic stenosis is present.   6. Pulmonic valve regurgitation not well-assessed.    EKG:  EKG is not ordered today.    Recent Labs: 04/15/2023: TSH 1.790 05/28/2023: Hemoglobin 16.9; Platelets 179 07/17/2023: ALT 22; BUN 23; Creatinine, Ser 1.20; Potassium 4.1; Sodium 145  Recent Lipid Panel    Component Value Date/Time   CHOL 108 07/17/2023 1427   CHOL 111 02/29/2016 1558   TRIG 100 07/17/2023 1427   TRIG 73 02/29/2016 1558   HDL 30 (L)  07/17/2023 1427   CHOLHDL 3.5 08/21/2018 1453   VLDL 15 02/29/2016 1558   LDLCALC 59 07/17/2023 1427  PHYSICAL EXAM:    VS:  BP 120/72 (BP Location: Left Arm, Patient Position: Sitting, Cuff Size: Normal)   Pulse (!) 59   Ht 6\' 3"  (1.905 m)   Wt 196 lb 3.2 oz (89 kg)   SpO2 97%   BMI 24.52 kg/m   BMI: Body mass index is 24.52 kg/m.  Physical Exam Vitals reviewed.  Constitutional:      Appearance: He is well-developed.  HENT:     Head: Normocephalic and atraumatic.  Eyes:     General:        Right eye: No discharge.        Left eye: No discharge.  Neck:     Vascular: No JVD.  Cardiovascular:     Rate and Rhythm: Normal rate and regular rhythm.     Heart sounds: Normal heart sounds, S1 normal and S2 normal. Heart sounds not distant. No midsystolic click and no opening snap. No murmur heard.    No friction rub.  Pulmonary:     Effort: Pulmonary effort is normal. No respiratory distress.     Breath sounds: Normal breath sounds. No decreased breath sounds, wheezing or rales.  Chest:     Chest wall: No tenderness.  Abdominal:     General: There is no distension.  Musculoskeletal:     Cervical back: Normal range of motion.  Skin:    General: Skin is warm and dry.     Nails: There is no clubbing.  Neurological:     Mental Status: He is alert and oriented to person, place, and time.  Psychiatric:        Speech: Speech normal.        Behavior: Behavior normal.        Thought Content: Thought content normal.        Judgment: Judgment normal.     Wt Readings from Last 3 Encounters:  08/19/23 196 lb 3.2 oz (89 kg)  08/14/23 195 lb 6.4 oz (88.6 kg)  07/17/23 194 lb 9.6 oz (88.3 kg)     ASSESSMENT & PLAN:   CAD status post CABG without angina: He is without symptoms of angina or cardiac decompensation.  Lexiscan MPI in 06/2023 showed no evidence of ischemia and was overall low risk.  Continue aggressive risk factor modification and secondary prevention including  aspirin, amlodipine, atorvastatin, and SL NTG.  Precautions discussed regarding avoiding concomitant nitro and sildenafil use (has never needed SL NTG).  HFpEF: Euvolemic and well compensated.  Weight stable.  Continued optimal blood pressure control.  Remains on Farxiga.  Not requiring a standing loop diuretic.  Carotid artery stenosis: Stable to improved 1 to 39% bilateral ICA stenosis on carotid artery ultrasound earlier this month.  HTN: Blood pressure is well-controlled in the office today.  He remains on amlodipine, chlorthalidone, and metoprolol tartrate.  HLD with associated DM2: LDL 59 in 07/2023 with normal AST/ALT at that time.  He remains on atorvastatin 20 mg.  Positional dizziness: Longstanding history of orthostatic dizziness that is improved as of late.  Recent carotid artery ultrasound showed improved ICA stenosis with echo showing no significant structural abnormality.  Recommend adequate hydration and slow positional changes.  No indication for further cardiac testing.   Disposition: F/u with Dr. Okey Dupre or an APP in 6 months.   Medication Adjustments/Labs and Tests Ordered: Current medicines are reviewed at length with the patient today.  Concerns regarding medicines are outlined above. Medication changes, Labs and Tests ordered today are summarized above and  listed in the Patient Instructions accessible in Encounters.   Signed, Eula Listen, PA-C 08/19/2023 2:37 PM     [facility] [place] [address] [place], [place] [address] [phone]

## 2023-08-19 ENCOUNTER — Ambulatory Visit: Payer: Medicare HMO | Attending: Physician Assistant | Admitting: Physician Assistant

## 2023-08-19 ENCOUNTER — Encounter: Payer: Self-pay | Admitting: Physician Assistant

## 2023-08-19 VITALS — BP 120/72 | HR 59 | Ht 75.0 in | Wt 196.2 lb

## 2023-08-19 DIAGNOSIS — I5032 Chronic diastolic (congestive) heart failure: Secondary | ICD-10-CM

## 2023-08-19 DIAGNOSIS — I10 Essential (primary) hypertension: Secondary | ICD-10-CM | POA: Diagnosis not present

## 2023-08-19 DIAGNOSIS — E785 Hyperlipidemia, unspecified: Secondary | ICD-10-CM

## 2023-08-19 DIAGNOSIS — R42 Dizziness and giddiness: Secondary | ICD-10-CM

## 2023-08-19 DIAGNOSIS — I2510 Atherosclerotic heart disease of native coronary artery without angina pectoris: Secondary | ICD-10-CM

## 2023-08-19 DIAGNOSIS — E1169 Type 2 diabetes mellitus with other specified complication: Secondary | ICD-10-CM

## 2023-08-19 DIAGNOSIS — I6523 Occlusion and stenosis of bilateral carotid arteries: Secondary | ICD-10-CM

## 2023-08-19 NOTE — Patient Instructions (Signed)
Medication Instructions:  Your Physician recommend you continue on your current medication as directed.     *If you need a refill on your cardiac medications before your next appointment, please call your pharmacy*  Lab Work: None  Testing/Procedures: None  Follow-Up: At [facility], you and your health needs are our priority.  As part of our continuing mission to provide you with exceptional heart care, we have created designated Provider Care Teams.  These Care Teams include your primary Cardiologist (physician) and Advanced Practice Providers (APPs -  Physician Assistants and Nurse Practitioners) who all work together to provide you with the care you need, when you need it.  We recommend signing up for the patient portal called "MyChart".  Sign up information is provided on this After Visit Summary.  MyChart is used to connect with patients for Virtual Visits (Telemedicine).  Patients are able to view lab/test results, encounter notes, upcoming appointments, etc.  Non-urgent messages can be sent to your provider as well.   To learn more about what you can do with MyChart, go to [email or web address].    Your next appointment:   6 month(s)  Provider:   You may see Yvonne Kendall, MD or one of the following Advanced Practice Providers on your designated Care Team:    Eula Listen, [place]

## 2023-08-23 ENCOUNTER — Ambulatory Visit: Payer: Medicare HMO

## 2023-08-23 DIAGNOSIS — E1142 Type 2 diabetes mellitus with diabetic polyneuropathy: Secondary | ICD-10-CM | POA: Diagnosis not present

## 2023-08-23 DIAGNOSIS — I739 Peripheral vascular disease, unspecified: Secondary | ICD-10-CM

## 2023-08-23 DIAGNOSIS — M2042 Other hammer toe(s) (acquired), left foot: Secondary | ICD-10-CM

## 2023-08-23 DIAGNOSIS — M2041 Other hammer toe(s) (acquired), right foot: Secondary | ICD-10-CM

## 2023-08-23 DIAGNOSIS — B351 Tinea unguium: Secondary | ICD-10-CM

## 2023-08-23 DIAGNOSIS — M79676 Pain in unspecified toe(s): Secondary | ICD-10-CM

## 2023-08-23 NOTE — Progress Notes (Signed)

## 2023-08-25 DIAGNOSIS — M542 Cervicalgia: Secondary | ICD-10-CM | POA: Diagnosis not present

## 2023-08-28 ENCOUNTER — Other Ambulatory Visit: Payer: Self-pay | Admitting: Nurse Practitioner

## 2023-08-29 NOTE — Telephone Encounter (Signed)
Requested Prescriptions  Pending Prescriptions Disp Refills   metoprolol tartrate (LOPRESSOR) 25 MG tablet [Pharmacy Med Name: METOPROLOL TARTRATE 25 MG TAB] 180 tablet 0    Sig: Take 0.5 tablets (12.5 mg total) by mouth 2 (two) times daily.     Cardiovascular:  Beta Blockers Passed - 08/28/2023  3:49 PM      Passed - Last BP in normal range    BP Readings from Last 1 Encounters:  08/19/23 120/72         Passed - Last Heart Rate in normal range    Pulse Readings from Last 1 Encounters:  08/19/23 (!) 59         Passed - Valid encounter within last 6 months    Recent Outpatient Visits           2 weeks ago Controlled type 2 diabetes with neuropathy (HCC)   [place] [facility] [place], Glyndon T, NP   1 month ago Controlled type 2 diabetes with neuropathy (HCC)   [place] [facility] [place], Corrie Dandy T, NP   2 months ago Dizziness   [place] [facility] [place], Megan P, DO   3 months ago Centrilobular emphysema (HCC)   [place] [facility] [place], [place] T, NP   4 months ago Controlled type 2 diabetes with neuropathy [facility])   [place] [facility] [place], Dorie Rank, NP       Future Appointments             In 1 month [name], Dorie Rank, NP [place] [facility]

## 2023-09-04 ENCOUNTER — Encounter: Payer: Self-pay | Admitting: Nurse Practitioner

## 2023-09-04 ENCOUNTER — Ambulatory Visit (INDEPENDENT_AMBULATORY_CARE_PROVIDER_SITE_OTHER): Payer: Medicare HMO | Admitting: Nurse Practitioner

## 2023-09-04 VITALS — BP 125/75 | HR 61 | Temp 97.4°F | Resp 18 | Ht 75.0 in | Wt 194.6 lb

## 2023-09-04 DIAGNOSIS — M542 Cervicalgia: Secondary | ICD-10-CM

## 2023-09-04 DIAGNOSIS — Z23 Encounter for immunization: Secondary | ICD-10-CM

## 2023-09-04 MED ORDER — HYDROCODONE-ACETAMINOPHEN 5-325 MG PO TABS: 1.0000 | ORAL_TABLET | Freq: Four times a day (QID) | ORAL | 0 refills | Status: AC | PRN

## 2023-09-04 MED ORDER — METHYLPREDNISOLONE 4 MG PO TBPK: ORAL_TABLET | ORAL | 0 refills | Status: DC

## 2023-09-04 NOTE — Progress Notes (Signed)
BP 125/75 (BP Location: Left Arm, Cuff Size: Normal)   Pulse 61   Temp (!) 97.4 F (36.3 C) (Oral)   Resp 18   Ht 6\' 3"  (1.905 m)   Wt 194 lb 9.6 oz (88.3 kg)   SpO2 97%   BMI 24.32 kg/m    Subjective:    Patient ID: Brendan [name], male    DOB: 1951/06/29, 72 y.o.   MRN: [id number]  HPI: Brendan [name] is a 72 y.o. male  Chief Complaint  Patient presents with   Neck Pain    Pt states he has still been having pain in his neck since last visit. States the muscle relaxer did not help at all.    NECK PAIN FOLLOW UP Follow-up today for neck pain, this is ongoing since 08/14/23 with no benefit from muscle relaxer provided last visit.  Last imaging was 2018 with mild degenerative changes to C3-4.  Has missed work days due to pain.    He is diabetic, A1c in July was 8.5% -- we added medication and his sugars at home have been 80 to 100 range.   Status: uncontrolled Treatments attempted: rest, ice, heat, APAP, and muscle relaxer  Compliant with recommended treatment: yes Relief with NSAIDs?:  No NSAIDs Taken Location:Right Duration:weeks Severity: 10/10 Quality: sharp, dull, aching, and throbbing -- if points head down gets a sharp pain Frequency: constant -- worse dependent on how he moves Radiation: none Aggravating factors: lifting and movement Alleviating factors: rest -- if he sits still Weakness:  no Paresthesias / decreased sensation:  no  Fevers:  no   Relevant past medical, surgical, family and social history reviewed and updated as indicated. Interim medical history since our last visit reviewed. Allergies and medications reviewed and updated.  Review of Systems  Constitutional:  Negative for activity change, diaphoresis, fatigue and fever.  Respiratory:  Negative for cough, chest tightness, shortness of breath and wheezing.   Cardiovascular:  Negative for chest pain, palpitations and leg swelling.  Gastrointestinal: Negative.   Endocrine: Negative for cold  intolerance, heat intolerance, polydipsia, polyphagia and polyuria.  Musculoskeletal:  Positive for neck pain.  Neurological: Negative.   Psychiatric/Behavioral: Negative.      Per HPI unless specifically indicated above     Objective:    BP 125/75 (BP Location: Left Arm, Cuff Size: Normal)   Pulse 61   Temp (!) 97.4 F (36.3 C) (Oral)   Resp 18   Ht 6\' 3"  (1.905 m)   Wt 194 lb 9.6 oz (88.3 kg)   SpO2 97%   BMI 24.32 kg/m   Wt Readings from Last 3 Encounters:  09/04/23 194 lb 9.6 oz (88.3 kg)  08/19/23 196 lb 3.2 oz (89 kg)  08/14/23 195 lb 6.4 oz (88.6 kg)    Physical Exam Vitals and nursing note reviewed.  Constitutional:      General: He is awake. He is not in acute distress.    Appearance: He is well-developed and overweight. He is not ill-appearing.  HENT:     Head: Normocephalic and atraumatic.     Right Ear: Hearing normal. No drainage.     Left Ear: Hearing normal. No drainage.  Eyes:     General: Lids are normal.        Right eye: No discharge.        Left eye: No discharge.     Conjunctiva/sclera: Conjunctivae normal.     Pupils: Pupils are equal, round, and reactive to light.  Neck:  Thyroid: No thyromegaly.     Vascular: No carotid bruit.     Comments: No rashes noted Cardiovascular:     Rate and Rhythm: Normal rate and regular rhythm.     Heart sounds: Normal heart sounds, S1 normal and S2 normal. No murmur heard.    No gallop.  Pulmonary:     Effort: Pulmonary effort is normal. No accessory muscle usage or respiratory distress.     Breath sounds: Normal breath sounds. No decreased breath sounds, wheezing or rhonchi.  Abdominal:     General: Bowel sounds are normal. There is no distension.     Palpations: Abdomen is soft.     Tenderness: There is no abdominal tenderness.  Musculoskeletal:     Cervical back: Normal range of motion and neck supple. No edema, erythema, rigidity, torticollis or crepitus. Pain with movement (with flexion only) and  muscular tenderness (right side, lower neck) present. No spinous process tenderness. Normal range of motion.     Right lower leg: No edema.     Left lower leg: No edema.  Skin:    General: Skin is warm and dry.  Neurological:     Mental Status: He is alert and oriented to person, place, and time.  Psychiatric:        Attention and Perception: Attention normal.        Mood and Affect: Mood normal.        Speech: Speech normal.        Behavior: Behavior normal. Behavior is cooperative.     Results for orders placed or performed in visit on 08/07/23  ECHOCARDIOGRAM COMPLETE  Result Value Ref Range   AR max vel 3.43 cm2   AV Peak grad 3.9 mmHg   Ao pk vel 0.99 m/s   S' Lateral 3.60 cm   Area-P 1/2 3.03 cm2   AV Area VTI 3.41 cm2   AV Mean grad 1.8 mmHg   Single Plane A4C EF 49.3 %   Single Plane A2C EF 47.5 %   Calc EF 48.6 %   AV Area mean vel 3.29 cm2   Est EF 50 - 55%       Assessment & Plan:   Problem List Items Addressed This Visit       Other   Neck pain - Primary    Acute and ongoing for one month, no benefit from muscle relaxer and home treatment regimen.  No rigidity and able to move well, but he does have discomfort with flexion.  No red flags.  Sugars are improved at home.  Will send in steroid taper (he is aware to monitor sugars closely with this and stop taking if >300, alert provider as well) and short burst of Norco 5-325 MG to take as needed only, aware not to take while working or driving.  Order for imaging placed, as none since 2018, discussed with patient and he is aware to obtain this.  Educated on plan of care and medications.  May use Voltaren gel as needed.  Avoid Ibuprofen oral medication.  Utilize heat pad.  Return in October as scheduled, sooner if worsening.      Relevant Orders   DG Cervical Spine Complete   Other Visit Diagnoses     Need for influenza vaccination       Flu vaccine in office today, educated patient on this.   Relevant Orders    Flu Vaccine Trivalent High Dose (Fluad) (Completed)        Follow  up plan: Return for as scheduled October 14th.

## 2023-09-04 NOTE — Assessment & Plan Note (Signed)
Acute and ongoing for one month, no benefit from muscle relaxer and home treatment regimen.  No rigidity and able to move well, but he does have discomfort with flexion.  No red flags.  Sugars are improved at home.  Will send in steroid taper (he is aware to monitor sugars closely with this and stop taking if >300, alert provider as well) and short burst of Norco 5-325 MG to take as needed only, aware not to take while working or driving.  Order for imaging placed, as none since 2018, discussed with patient and he is aware to obtain this.  Educated on plan of care and medications.  May use Voltaren gel as needed.  Avoid Ibuprofen oral medication.  Utilize heat pad.  Return in October as scheduled, sooner if worsening.

## 2023-09-04 NOTE — Patient Instructions (Signed)
Cervical Radiculopathy  Cervical radiculopathy means that a nerve in the neck (a cervical nerve) is pinched or bruised. This can happen because of an injury to the cervical spine (vertebrae) in the neck, or as a normal part of getting older. This condition can cause pain or loss of feeling (numbness) that runs from your neck all the way down to your arm and fingers. Often, this condition gets better with rest. Treatment may be needed if the condition does not get better. What are the causes? A neck injury. A bulging disk in your spine. Sudden muscle tightening (muscle spasms). Tight muscles in your neck due to overuse. Arthritis. Breakdown in the bones and joints of the spine (spondylosis) due to getting older. Bone spurs that form near the nerves in the neck. What are the signs or symptoms? Pain. The pain may: Run from the neck to the arm and hand. Be very bad or irritating. Get worse when you move your neck. Loss of feeling or tingling in your arm or hand. Weakness in your arm or hand, in very bad cases. How is this treated? In many cases, treatment is not needed for this condition. With rest, the condition often gets better over time. If treatment is needed, options may include: Wearing a soft neck collar (cervical collar) for short periods of time. Doing exercises (physical therapy) to strengthen your neck muscles. Taking medicines. Having shots (injections) in your spine, in very bad cases. Having surgery. This may be needed if other treatments do not help. The type of surgery that is used will depend on the cause of your condition. Follow these instructions at home: If you have a soft neck collar: Wear it as told by your doctor. Take it off only as told by your doctor. Ask your doctor if you can take the collar off for cleaning and bathing. If you are allowed to take the collar off for cleaning or bathing: Follow instructions from your doctor about how to take off the collar  safely. Clean the collar by wiping it with mild soap and water and drying it completely. Take out any removable pads in the collar every 1-2 days. Wash them by hand with soap and water. Let them air-dry completely before you put them back in the collar. Check your skin under the collar for redness or sores. If you see any, tell your doctor. Managing pain     Take over-the-counter and prescription medicines only as told by your doctor. If told, put ice on the painful area. To do this: If you have a soft neck collar, take if off as told by your doctor. Put ice in a plastic bag. Place a towel between your skin and the bag. Leave the ice on for 20 minutes, 2-3 times a day. Take off the ice if your skin turns bright red. This is very important. If you cannot feel pain, heat, or cold, you have a greater risk of damage to the area. If using ice does not help, you can try using heat. Use the heat source that your doctor recommends, such as a moist heat pack or a heating pad. Place a towel between your skin and the heat source. Leave the heat on for 20-30 minutes. Take off the heat if your skin turns bright red. This is very important. If you cannot feel pain, heat, or cold, you have a greater risk of getting burned. You may try a gentle neck and shoulder rub (massage). Activity Rest as needed. Return  to your normal activities when your doctor says that it is safe. Do exercises as told by your doctor or physical therapist. You may have to avoid lifting. Ask your doctor how much you can safely lift. General instructions Use a flat pillow when you sleep. Do not drive while wearing a soft neck collar. If you do not have a soft neck collar, ask your doctor if it is safe to drive while your neck heals. Ask your doctor if you should avoid driving or using machines while you are taking your medicine. Do not smoke or use any products that contain nicotine or tobacco. If you need help quitting, ask your  doctor. Keep all follow-up visits. Contact a doctor if: Your condition does not get better with treatment. Get help right away if: Your pain gets worse and medicine does not help. You lose feeling or feel weak in your hand, arm, face, or leg. You have a high fever. Your neck is stiff. You cannot control when you poop or pee (have incontinence). You have trouble with walking, balance, or talking. Summary Cervical radiculopathy means that a nerve in the neck is pinched or bruised. A nerve can get pinched from a bulging disk, arthritis, an injury to the neck, or other causes. Symptoms include pain, tingling, or loss of feeling that goes from the neck to the arm or hand. Weakness in your arm or hand can happen in very bad cases. Treatment may include resting, wearing a soft neck collar, and doing exercises. You might need to take medicines for pain. In very bad cases, shots or surgery may be needed. This information is not intended to replace advice given to you by your health care provider. Make sure you discuss any questions you have with your health care provider. Document Revised: 06/22/2021 Document Reviewed: 06/22/2021 Elsevier Patient Education  2024 [employer].

## 2023-09-09 ENCOUNTER — Ambulatory Visit
Admission: RE | Admit: 2023-09-09 | Discharge: 2023-09-09 | Disposition: A | Discharge status: Home / Self Care | Payer: Medicare HMO | Source: Ambulatory Visit | Attending: Nurse Practitioner | Admitting: Nurse Practitioner

## 2023-09-09 ENCOUNTER — Ambulatory Visit
Admission: RE | Admit: 2023-09-09 | Discharge: 2023-09-09 | Disposition: A | Discharge status: Home / Self Care | Payer: Medicare HMO | Attending: Nurse Practitioner | Admitting: Nurse Practitioner

## 2023-09-09 DIAGNOSIS — M542 Cervicalgia: Secondary | ICD-10-CM | POA: Diagnosis not present

## 2023-09-09 DIAGNOSIS — M5021 Other cervical disc displacement,  high cervical region: Secondary | ICD-10-CM | POA: Diagnosis not present

## 2023-09-09 DIAGNOSIS — M4312 Spondylolisthesis, cervical region: Secondary | ICD-10-CM | POA: Diagnosis not present

## 2023-09-09 DIAGNOSIS — M47812 Spondylosis without myelopathy or radiculopathy, cervical region: Secondary | ICD-10-CM | POA: Diagnosis not present

## 2023-09-19 NOTE — Progress Notes (Signed)
Good morning, please let Brendan [name] know his imaging returned and there is some mild arthritis to his neck area.  If he continues to have pain may need to get him into physical therapy or orthopedics.  Hope he is feeling better.  Any questions? Keep being amazing!!  Thank you for allowing me to participate in your care.  I appreciate you. Kindest regards, Miciah Shealy

## 2023-09-23 ENCOUNTER — Telehealth: Payer: Self-pay | Admitting: Nurse Practitioner

## 2023-09-23 NOTE — Telephone Encounter (Signed)
See result notes. 

## 2023-09-23 NOTE — Telephone Encounter (Signed)
Pt called for x-ray results. Cb# [phone]

## 2023-09-30 ENCOUNTER — Other Ambulatory Visit: Payer: Self-pay | Admitting: Internal Medicine

## 2023-10-13 NOTE — Patient Instructions (Signed)
 Be Involved in Caring For Your Health:  Taking Medications When medications are taken as directed, they can greatly improve your health. But if they are not taken as prescribed, they may not work. In some cases, not taking them correctly can be harmful. To help ensure your treatment remains effective and safe, understand your medications and how to take them. Bring your medications to each visit for review by your provider.  Your lab results, notes, and after visit summary will be available on My Chart. We strongly encourage you to use this feature. If lab results are abnormal the clinic will contact you with the appropriate steps. If the clinic does not contact you assume the results are satisfactory. You can always view your results on My Chart. If you have questions regarding your health or results, please contact the clinic during office hours. You can also ask questions on My Chart.  We at [facility] are grateful that you chose [place] to provide your care. We strive to provide evidence-based and compassionate care and are always looking for feedback. If you get a survey from the clinic please complete this so we can hear your opinions.  Diabetes Mellitus and Skin Care Diabetes, also called diabetes mellitus, can lead to skin problems. If blood sugar (glucose) is not well controlled, it can cause problems over time. These problems include: Damage to nerves. This can affect your ability to feel wounds. This means you may not notice small skin injuries that could lead to bigger problems. This can also decrease the amount that you sweat, causing dry skin. Damage to blood vessels. The lack of blood flow can cause skin to break down. It can also slow healing time, which can lead to infections. Areas of skin that become thick or discolored. Common skin conditions There are certain skin conditions that often affect people with diabetes. These include: Dry skin. Thin skin. The skin on the feet  may get thinner, break more easily, and heal more slowly than normal. Skin infections from bacteria. These include: Styes. These are infections near the eyelid. Boils. These are bumps filled with pus. Infected hair follicles. Infections of the skin around the nails. Fungal skin infections. These are most common in areas where skin rubs together, such as in the armpits or under the breasts. Common skin changes Diabetes can also cause the skin to change. You may develop: Dark, velvety markings on your skin. These may appear on your face, neck, armpits, inner thighs, and groin. Red, raised, scar-like tissue that may itch, feel painful, or become a wound. Blisters on your feet, toes, hands, or fingers. Thick, wax-like areas of skin. In most cases, these occur on the hands, forehead, or toes. Brown or red, ring-shaped or half-ring-shaped patches of skin on the ears or fingers. Pea-shaped, yellow bumps that may be itchy and have a red ring around them. This may affect your arms, feet, buttocks, and the top of your hands. Round, discolored patches of tan skin that do not hurt or itch. These may look like age spots. Supplies needed: Mild soap or gentle skin cleanser. Lotion. How to care for dry, itchy skin Frequent high glucose levels can cause skin to become itchy. Poor blood circulation and skin infections can make dry skin worse. If you have dry, itchy skin: Avoid very hot showers and baths. Use mild soap and gentle skin cleansers. Do not use soap that is perfumed, harsh, or that dries your skin. Moisturizing soaps may help. Put on moisturizing  lotion as soon as you finish bathing. Do not scratch dry skin. Scratching can expose skin to infection. If you have a rash or if your skin is very itchy, contact your health care provider. Skin that is red or covered in a rash may be a sign of an allergic reaction. Very itchy skin may mean that you need help to manage your diabetes better. You may also  need treatment for an infection. General tips Most skin problems can be prevented or treated easily if caught early. Talk with your health care provider if you have any concerns. General tips include: Check your skin every day for cuts, bruises, redness, blisters, or sores, especially on your feet. If you cannot see the bottom of your feet, use a mirror or ask someone for help. Tell your health care provider if you have any of these injuries and if they are healing slowly. Keep your skin clean and dry. Do not use hot water. Moisturize your skin to prevent chapping. Keep your blood glucose levels within target range. Follow these instructions at home:  Take over-the-counter and prescription medicines only as told by your health care provider. This includes all diabetes medicines you are taking. Schedule a foot exam with your health care provider once a year. During the exam, the structure and skin of your feet will be checked for problems. Make sure that your health care provider does a visual foot exam at every visit. If you get a skin injury, such as a cut, blister, or sore, check the area every day for signs of infection. Check for: Redness, swelling, or pain. Fluid or blood. Warmth. Pus or a bad smell. Do not use any products that contain nicotine or tobacco. These products include cigarettes, chewing tobacco, and vaping devices, such as e-cigarettes. If you need help quitting, ask your health care provider. Where to find more information American Diabetes Association: diabetes.org Association of Diabetes Care & Education Specialists: diabeteseducator.org Contact a health care provider if: You get a cut or sore, especially on your feet. You have signs of infection after a skin injury. You have itchy skin that turns red or develops a rash. You have discolored areas of skin. You have places on your skin that change. They may thicken or appear shiny. This information is not intended to  replace advice given to you by your health care provider. Make sure you discuss any questions you have with your health care provider. Document Revised: 06/20/2022 Document Reviewed: 06/20/2022 Elsevier Patient Education  2024 [employer].

## 2023-10-14 ENCOUNTER — Encounter: Payer: Self-pay | Admitting: Nurse Practitioner

## 2023-10-14 ENCOUNTER — Ambulatory Visit (INDEPENDENT_AMBULATORY_CARE_PROVIDER_SITE_OTHER): Payer: Medicare HMO | Admitting: Nurse Practitioner

## 2023-10-14 VITALS — BP 131/78 | HR 68 | Temp 97.6°F | Ht 75.0 in | Wt 194.1 lb

## 2023-10-14 DIAGNOSIS — I5032 Chronic diastolic (congestive) heart failure: Secondary | ICD-10-CM | POA: Diagnosis not present

## 2023-10-14 DIAGNOSIS — K760 Fatty (change of) liver, not elsewhere classified: Secondary | ICD-10-CM

## 2023-10-14 DIAGNOSIS — I77810 Thoracic aortic ectasia: Secondary | ICD-10-CM | POA: Diagnosis not present

## 2023-10-14 DIAGNOSIS — E1140 Type 2 diabetes mellitus with diabetic neuropathy, unspecified: Secondary | ICD-10-CM | POA: Diagnosis not present

## 2023-10-14 DIAGNOSIS — J432 Centrilobular emphysema: Secondary | ICD-10-CM | POA: Diagnosis not present

## 2023-10-14 DIAGNOSIS — E1169 Type 2 diabetes mellitus with other specified complication: Secondary | ICD-10-CM | POA: Diagnosis not present

## 2023-10-14 DIAGNOSIS — E785 Hyperlipidemia, unspecified: Secondary | ICD-10-CM

## 2023-10-14 DIAGNOSIS — I152 Hypertension secondary to endocrine disorders: Secondary | ICD-10-CM | POA: Diagnosis not present

## 2023-10-14 DIAGNOSIS — E1159 Type 2 diabetes mellitus with other circulatory complications: Secondary | ICD-10-CM

## 2023-10-14 DIAGNOSIS — M542 Cervicalgia: Secondary | ICD-10-CM

## 2023-10-14 DIAGNOSIS — I739 Peripheral vascular disease, unspecified: Secondary | ICD-10-CM | POA: Diagnosis not present

## 2023-10-14 DIAGNOSIS — Z0000 Encounter for general adult medical examination without abnormal findings: Secondary | ICD-10-CM

## 2023-10-14 DIAGNOSIS — I2510 Atherosclerotic heart disease of native coronary artery without angina pectoris: Secondary | ICD-10-CM

## 2023-10-14 DIAGNOSIS — I272 Pulmonary hypertension, unspecified: Secondary | ICD-10-CM

## 2023-10-14 NOTE — Assessment & Plan Note (Signed)
Acute and improving at this time.  Educated on plan of care and medications.  May use Voltaren gel as needed.  Avoid Ibuprofen oral medication.  Utilize heat pad.  Tylenol as needed.  Will send to PT if ongoing.

## 2023-10-14 NOTE — Assessment & Plan Note (Signed)
Chronic, stable.  Continue statin and ASA daily at home for prevention + collaboration with podiatry.

## 2023-10-14 NOTE — Assessment & Plan Note (Signed)
Followed by cardiology, continue this collaboration.  Recent notes and testing reviewed.

## 2023-10-14 NOTE — Assessment & Plan Note (Signed)
Chronic, ongoing.  A1c 8.5% in July 2024, trend up from previous 7.6%. Urine ALB 30 April 2023. Continue Metformin, Farxiga, and Januvia (tolerating this well and noticing reduction in BS levels -- adjust based on kidney function as needed).  Recommend heavy focus on diet with less pasta and potatoes.  Continue Gabapentin as prescribed by podiatry and adjust as needed based on renal function.  Check BS BID and document for next visit.  Recheck A1c today. - Eye and foot exams up to date - Vaccinations up to date - Statin on board, allergic to ACE

## 2023-10-14 NOTE — Assessment & Plan Note (Signed)
Chronic, stable with minimal use of Albuterol.  April 2024 - FEV1 54% and FEV1/FVC 59%, change from in 2022 was FEV1 64% and FEV1/FVC 73%, however had cold at the time -- will repeat in future.  Continue to monitor and adjust regimen as needed.  Continue Albuterol and recommend he use this if wheezing present or SOB.  Continue cessation of smoking and continue yearly lung screening.  Spirometry annually.  Have recommend a daily inhaler for maintenance and to help lung function, he refuses.

## 2023-10-14 NOTE — Assessment & Plan Note (Signed)
Chronic.  Continue collaboration with cardiology and medication regimen as ordered.  No recent NTG use.

## 2023-10-14 NOTE — Assessment & Plan Note (Signed)
Chronic, ongoing.  Continue current medication regimen and adjust as needed.  Lipid panel today.  Recent LDL <70.

## 2023-10-14 NOTE — Progress Notes (Signed)
BP 131/78 (BP Location: Left Arm, Patient Position: Sitting, Cuff Size: Normal)   Pulse 68   Temp 97.6 F (36.4 C) (Oral)   Ht 6\' 3"  (1.905 m)   Wt 194 lb 1.6 oz (88 kg)   SpO2 97%   BMI 24.26 kg/m    Subjective:    Patient ID: Brendan [name], male    DOB: 1951/05/22, 72 y.o.   MRN: [id number]  HPI: Brendan [name] is a 72 y.o. male  Chief Complaint  Patient presents with   Medical Management of Chronic Issues    2 month follow up on HTN, Mood, T2DM, HF, and COPD. No new concerns today    Continues to have some neck pain, but this is improving from what it was.    DIABETES A1c 8.5%, added Januvia 07/17/23.  Continues on Metformin 1000 MG BID, Januvia 100 MG daily, and Farxiga 10 MG daily.  Continues Gabapentin 300 MG in morning and 600 MG at bedtime. Taking B12 daily for neuropathy and low levels in past.   Hypoglycemic episodes:no Polydipsia/polyuria: no Visual disturbance: no Chest pain: no Paresthesias: no Glucose Monitoring: yes             Accucheck frequency: Daily             Fasting glucose: 100 to 120             Post prandial:              Evening:             Before meals: Taking Insulin?: no             Long acting insulin:             Short acting insulin: Blood Pressure Monitoring: not checking Retinal Examination: Up To Date -- Dr. Janett Billow Exam: Up to Date Pneumovax: Up to Date Influenza: Up to Date Aspirin: yes    HYPERTENSION / HYPERLIPIDEMIA Taking Metoprolol, Hygroton, Amlodipine, and Lipitor. Since having NTG has never used.  Allergic to Lisinopril.   Followed by cardiology ([facility]) with last visit 06/17/23, he went for stress test and Myo -- EF 66% -- stable testing. Satisfied with current treatment? yes Duration of hypertension: chronic BP monitoring frequency: a few days a week BP range: 120-130/70-80 range BP medication side effects: no Duration of hyperlipidemia: chronic Cholesterol medication side effects: no Cholesterol supplements:  none Medication compliance: good compliance Aspirin: yes Recent stressors: no Recurrent headaches: no Visual changes: no Palpitations: no Dyspnea: no Chest pain: no Lower extremity edema: no Dizzy/lightheaded: no   COPD Continues on Singulair and Albuterol. Lung CT screening 08/09/23 -- noted aortic atherosclerosis + emphysema.  Noted in past hepatic steatosis + left adrenal nodule which is stable at 1.8 cm -- not noted this scan.    Has not smoked in > 7 years since bypass surgery. COPD status: stable Satisfied with current treatment?: yes Oxygen use: no Dyspnea frequency: no Cough frequency: no Rescue inhaler frequency: only if sick Limitation of activity: no Productive cough: no Last Spirometry: April 2024 - FEV1 54% and FEV1/FVC 59% Pneumovax: Up to Date Influenza: Up to Date      10/14/2023    1:55 PM 09/04/2023    8:53 AM 07/17/2023    2:44 PM 06/04/2023    4:08 PM 05/24/2023    3:48 PM  Depression screen PHQ 2/9  Decreased Interest 0 0 0 0 0  Down, Depressed, Hopeless 0 0 0 0 0  PHQ - 2 Score 0 0 0 0 0  Altered sleeping 0 0 0 0 0  Tired, decreased energy 0 0 0 1 1  Change in appetite 0 0 0 0 0  Feeling bad or failure about yourself  0 0 0 0 0  Trouble concentrating 0 0 0 0 0  Moving slowly or fidgety/restless 0 0 0 0 0  Suicidal thoughts 0 0 0 0 0  PHQ-9 Score 0 0 0 1 1  Difficult doing work/chores  Not difficult at all Not difficult at all Not difficult at all Not difficult at all       10/14/2023    1:55 PM 09/04/2023    8:54 AM 07/17/2023    2:44 PM 06/04/2023    4:08 PM  GAD 7 : Generalized Anxiety Score  Nervous, Anxious, on Edge 0 0 0 0  Control/stop worrying 0 0 0 1  Worry too much - different things 0 0 0 1  Trouble relaxing 0 0 0 0  Restless 0 0 0 0  Easily annoyed or irritable 0 0 0 0  Afraid - awful might happen 0 0 0 0  Total GAD 7 Score 0 0 0 2  Anxiety Difficulty  Not difficult at all Not difficult at all Not difficult at all   Relevant past  medical, surgical, family and social history reviewed and updated as indicated. Interim medical history since our last visit reviewed. Allergies and medications reviewed and updated.  Review of Systems  Constitutional:  Negative for activity change, diaphoresis, fatigue and fever.  Respiratory:  Negative for cough, chest tightness, shortness of breath and wheezing.   Cardiovascular:  Negative for chest pain, palpitations and leg swelling.  Gastrointestinal: Negative.   Endocrine: Negative for cold intolerance, heat intolerance, polydipsia, polyphagia and polyuria.  Neurological: Negative.   Psychiatric/Behavioral: Negative.     Per HPI unless specifically indicated above     Objective:    BP 131/78 (BP Location: Left Arm, Patient Position: Sitting, Cuff Size: Normal)   Pulse 68   Temp 97.6 F (36.4 C) (Oral)   Ht 6\' 3"  (1.905 m)   Wt 194 lb 1.6 oz (88 kg)   SpO2 97%   BMI 24.26 kg/m   Wt Readings from Last 3 Encounters:  10/14/23 194 lb 1.6 oz (88 kg)  09/04/23 194 lb 9.6 oz (88.3 kg)  08/19/23 196 lb 3.2 oz (89 kg)    Physical Exam Vitals and nursing note reviewed.  Constitutional:      General: He is awake. He is not in acute distress.    Appearance: He is well-developed and overweight. He is not ill-appearing.  HENT:     Head: Normocephalic and atraumatic.     Right Ear: Hearing normal. No drainage.     Left Ear: Hearing normal. No drainage.  Eyes:     General: Lids are normal.        Right eye: No discharge.        Left eye: No discharge.     Conjunctiva/sclera: Conjunctivae normal.     Pupils: Pupils are equal, round, and reactive to light.  Neck:     Vascular: No carotid bruit.  Cardiovascular:     Rate and Rhythm: Normal rate and regular rhythm.     Heart sounds: Normal heart sounds, S1 normal and S2 normal. No murmur heard.    No gallop.  Pulmonary:     Effort: Pulmonary effort is normal. No accessory muscle usage or  respiratory distress.     Breath  sounds: Normal breath sounds. No decreased breath sounds, wheezing or rhonchi.  Abdominal:     General: Bowel sounds are normal. There is no distension.     Palpations: Abdomen is soft.     Tenderness: There is no abdominal tenderness.  Musculoskeletal:     Cervical back: Normal range of motion and neck supple.     Right lower leg: No edema.     Left lower leg: No edema.  Skin:    General: Skin is warm and dry.  Neurological:     Mental Status: He is alert and oriented to person, place, and time.  Psychiatric:        Attention and Perception: Attention normal.        Mood and Affect: Mood normal.        Speech: Speech normal.        Behavior: Behavior normal. Behavior is cooperative.    Results for orders placed or performed in visit on 08/07/23  ECHOCARDIOGRAM COMPLETE  Result Value Ref Range   AR max vel 3.43 cm2   AV Peak grad 3.9 mmHg   Ao pk vel 0.99 m/s   S' Lateral 3.60 cm   Area-P 1/2 3.03 cm2   AV Area VTI 3.41 cm2   AV Mean grad 1.8 mmHg   Single Plane A4C EF 49.3 %   Single Plane A2C EF 47.5 %   Calc EF 48.6 %   AV Area mean vel 3.29 cm2   Est EF 50 - 55%       Assessment & Plan:   Problem List Items Addressed This Visit       Cardiovascular and Mediastinum   Chronic heart failure with preserved ejection fraction (HFpEF) (HCC) (Chronic)    Chronic, stable.  Euvolemic on exam today.  Continue collaboration with cardiology and current medication regimen as ordered by them, recent notes and testing reviewed.  On Farxiga for prevention.  Recommend: - Reminded to call for an overnight weight gain of >2 pounds or a weekly weight weight of >5 pounds - not adding salt to his food and has been reading food labels. Reviewed the importance of keeping daily sodium intake to 2000mg  daily  - Avoid Ibuprofen medications      Relevant Orders   Comprehensive metabolic panel   Lipid Panel w/o Chol/HDL Ratio   Coronary artery disease (Chronic)    Chronic.  Continue  collaboration with cardiology and medication regimen as ordered.  No recent NTG use.      Relevant Orders   Comprehensive metabolic panel   Lipid Panel w/o Chol/HDL Ratio   Dilated aortic root (HCC) (Chronic)    Followed by cardiology, continue this collaboration.  Recent notes and testing reviewed.      Relevant Orders   Comprehensive metabolic panel   Hypertension associated with diabetes (HCC) (Chronic)    Chronic, stable.  BP at goal in office and on home readings.  Continue current medication regimen and adjust as needed.  Continue collaboration with cardiology, recent notes reviewed.  Recommend he monitor BP at least a few times a week at home and document.  DASH diet focus.  LABS: CMP.  Urine ALB 30 April 2023.        Relevant Orders   HgB A1c   Comprehensive metabolic panel   Peripheral vascular disease (HCC) (Chronic)    Chronic, stable.  Continue statin and ASA daily at home for prevention + collaboration with  podiatry.      Relevant Orders   Comprehensive metabolic panel   Lipid Panel w/o Chol/HDL Ratio     Respiratory   Centrilobular emphysema (HCC) (Chronic)    Chronic, stable with minimal use of Albuterol.  April 2024 - FEV1 54% and FEV1/FVC 59%, change from in 2022 was FEV1 64% and FEV1/FVC 73%, however had cold at the time -- will repeat in future.  Continue to monitor and adjust regimen as needed.  Continue Albuterol and recommend he use this if wheezing present or SOB.  Continue cessation of smoking and continue yearly lung screening.  Spirometry annually.  Have recommend a daily inhaler for maintenance and to help lung function, he refuses.        Digestive   Hepatic steatosis    Chronic.  Noted on lung screening since July 2021 -- recommend focus on healthy diet and will continue to monitor.  CMP today.      Relevant Orders   Comprehensive metabolic panel     Endocrine   Controlled type 2 diabetes with neuropathy (HCC) - Primary (Chronic)    Chronic,  ongoing.  A1c 8.5% in July 2024, trend up from previous 7.6%. Urine ALB 30 April 2023. Continue Metformin, Farxiga, and Januvia (tolerating this well and noticing reduction in BS levels -- adjust based on kidney function as needed).  Recommend heavy focus on diet with less pasta and potatoes.  Continue Gabapentin as prescribed by podiatry and adjust as needed based on renal function.  Check BS BID and document for next visit.  Recheck A1c today. - Eye and foot exams up to date - Vaccinations up to date - Statin on board, allergic to ACE      Relevant Orders   HgB A1c   Hyperlipidemia associated with type 2 diabetes mellitus (HCC) (Chronic)    Chronic, ongoing.  Continue current medication regimen and adjust as needed.  Lipid panel today.  Recent LDL <70.      Relevant Orders   HgB A1c   Comprehensive metabolic panel   Lipid Panel w/o Chol/HDL Ratio     Other   Neck pain    Acute and improving at this time.  Educated on plan of care and medications.  May use Voltaren gel as needed.  Avoid Ibuprofen oral medication.  Utilize heat pad.  Tylenol as needed.  Will send to PT if ongoing.        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

## 2023-10-14 NOTE — Assessment & Plan Note (Signed)
Chronic, stable.  BP at goal in office and on home readings.  Continue current medication regimen and adjust as needed.  Continue collaboration with cardiology, recent notes reviewed.  Recommend he monitor BP at least a few times a week at home and document.  DASH diet focus.  LABS: CMP.  Urine ALB 30 April 2023.

## 2023-10-14 NOTE — Assessment & Plan Note (Signed)
Chronic.  Noted on lung screening since July 2021 -- recommend focus on healthy diet and will continue to monitor.  CMP today.

## 2023-10-14 NOTE — Assessment & Plan Note (Signed)
Chronic, stable.  Euvolemic on exam today.  Continue collaboration with cardiology and current medication regimen as ordered by them, recent notes and testing reviewed.  On Farxiga for prevention.  Recommend: - Reminded to call for an overnight weight gain of >2 pounds or a weekly weight weight of >5 pounds - not adding salt to his food and has been reading food labels. Reviewed the importance of keeping daily sodium intake to 2000mg  daily  - Avoid Ibuprofen medications

## 2023-10-15 ENCOUNTER — Other Ambulatory Visit: Payer: Self-pay | Admitting: Nurse Practitioner

## 2023-10-15 ENCOUNTER — Other Ambulatory Visit: Payer: Self-pay | Admitting: Podiatry

## 2023-10-15 LAB — COMPREHENSIVE METABOLIC PANEL
ALT: 17 [IU]/L (ref 0–44)
AST: 14 [IU]/L (ref 0–40)
Albumin: 4.5 g/dL (ref 3.8–4.8)
Alkaline Phosphatase: 56 [IU]/L (ref 44–121)
BUN/Creatinine Ratio: 19 (ref 10–24)
BUN: 28 mg/dL — ABNORMAL HIGH (ref 8–27)
Bilirubin Total: 0.3 mg/dL (ref 0.0–1.2)
CO2: 23 mmol/L (ref 20–29)
Calcium: 9.6 mg/dL (ref 8.6–10.2)
Chloride: 104 mmol/L (ref 96–106)
Creatinine, Ser: 1.45 mg/dL — ABNORMAL HIGH (ref 0.76–1.27)
Globulin, Total: 1.8 g/dL (ref 1.5–4.5)
Glucose: 88 mg/dL (ref 70–99)
Potassium: 4 mmol/L (ref 3.5–5.2)
Sodium: 143 mmol/L (ref 134–144)
Total Protein: 6.3 g/dL (ref 6.0–8.5)
eGFR: 51 mL/min/{1.73_m2} — ABNORMAL LOW (ref >59)

## 2023-10-15 LAB — HEMOGLOBIN A1C
Est. average glucose Bld gHb Est-mCnc: 154 mg/dL
Hgb A1c MFr Bld: 7 % — ABNORMAL HIGH (ref 4.8–5.6)

## 2023-10-15 LAB — LIPID PANEL W/O CHOL/HDL RATIO
Cholesterol, Total: 101 mg/dL (ref 100–199)
HDL: 30 mg/dL — ABNORMAL LOW (ref >39)
LDL Chol Calc (NIH): 50 mg/dL (ref 0–99)
Triglycerides: 112 mg/dL (ref 0–149)
VLDL Cholesterol Cal: 21 mg/dL (ref 5–40)

## 2023-10-15 NOTE — Progress Notes (Signed)
Good afternoon, please let Aven know his labs have returned: - A1c is trending down from 8.5% to 7%.  Woohoo!!  Continue all current medications. - Mild kidney disease remains present, ensure low salt intake at home and good water intake.   - Cholesterol levels remains stable.  Any questions? Keep being amazing!!  Thank you for allowing me to participate in your care.  I appreciate you. Kindest regards, Kanetra Ho

## 2023-10-15 NOTE — Progress Notes (Signed)
Good afternoon, please let Brendan [name] know his labs have returned: - A1c is trending down from 8.5% to 7%.  Woohoo!!  Continue all current medications. - Mild kidney disease remains present, ensure low salt intake at home and good water intake.   - Cholesterol levels remains stable.  Any questions? Keep being amazing!!  Thank you for allowing me to participate in your care.  I appreciate you. Kindest regards, Kanetra Ho

## 2023-10-16 NOTE — Telephone Encounter (Signed)
Requested medication (s) are due for refill today: Yes  Requested medication (s) are on the active medication list: Yes  Last refill:  10/12/22  Future visit scheduled: Yes  Notes to clinic:  Prescription has expired.    Requested Prescriptions  Pending Prescriptions Disp Refills   FARXIGA 10 MG TABS tablet [Pharmacy Med Name: FARXIGA 10 MG TABLET] 90 tablet 0    Sig: Take 1 tablet (10 mg total) by mouth daily before breakfast.     Endocrinology:  Diabetes - SGLT2 Inhibitors Failed - 10/15/2023 12:22 PM      Failed - Cr in normal range and within 360 days    Creatinine, Ser  Date Value Ref Range Status  10/14/2023 1.45 (H) 0.76 - 1.27 mg/dL Final         Failed - eGFR in normal range and within 360 days    GFR calc Af Amer  Date Value Ref Range Status  08/23/2020 71 >59 mL/min/1.73 Final    Comment:    **[facility] currently reports eGFR in compliance with the current**   recommendations of the [employer]. [facility] will   update reporting as new guidelines are published from the NKF-ASN   Task force.    GFR, Estimated  Date Value Ref Range Status  05/28/2023 >60 >60 mL/min Final    Comment:    (NOTE) Calculated using the CKD-EPI Creatinine Equation (2021)    eGFR  Date Value Ref Range Status  10/14/2023 51 (L) >59 mL/min/1.73 Final         Passed - HBA1C is between 0 and 7.9 and within 180 days    Hemoglobin A1C  Date Value Ref Range Status  09/17/2016 5.9%  Final   HB A1C (BAYER DCA - WAIVED)  Date Value Ref Range Status  07/17/2023 8.5 (H) 4.8 - 5.6 % Final    Comment:             Prediabetes: 5.7 - 6.4          Diabetes: >6.4          Glycemic control for adults with diabetes: <7.0    Hgb A1c MFr Bld  Date Value Ref Range Status  10/14/2023 7.0 (H) 4.8 - 5.6 % Final    Comment:             Prediabetes: 5.7 - 6.4          Diabetes: >6.4          Glycemic control for adults with diabetes: <7.0          Passed - Valid encounter  within last 6 months    Recent Outpatient Visits           2 days ago Controlled type 2 diabetes with neuropathy (HCC)   [place] [facility] [place], Dorie Rank, NP   1 month ago Neck pain   [place] [facility] [place], [place] T, NP   2 months ago Controlled type 2 diabetes with neuropathy (HCC)   [place] [facility] [place], Corrie Dandy T, NP   3 months ago Controlled type 2 diabetes with neuropathy [facility])   [place] [facility] [place], Corrie Dandy T, NP   4 months ago Dizziness   [place] [facility] [place], [place], DO       Future Appointments             In 3 months [name], Dorie Rank, NP [place] [facility]

## 2023-11-25 ENCOUNTER — Other Ambulatory Visit: Payer: Self-pay | Admitting: Nurse Practitioner

## 2023-11-26 ENCOUNTER — Ambulatory Visit: Payer: Medicare HMO | Admitting: Emergency Medicine

## 2023-11-26 VITALS — BP 120/72 | Ht 75.0 in | Wt 196.8 lb

## 2023-11-26 DIAGNOSIS — Z7409 Other reduced mobility: Secondary | ICD-10-CM

## 2023-11-26 DIAGNOSIS — Z0000 Encounter for general adult medical examination without abnormal findings: Secondary | ICD-10-CM | POA: Diagnosis not present

## 2023-11-26 NOTE — Progress Notes (Signed)
Subjective:   Brendan [name] is a 72 y.o. male who presents for Medicare Annual/Subsequent preventive examination.  Visit Complete: In person   Cardiac Risk Factors include: advanced age (>80men, >42 women);male gender;diabetes mellitus;hypertension;dyslipidemia     Objective:    Today's Vitals   11/26/23 1536  BP: 120/72  Weight: 196 lb 12.8 oz (89.3 kg)  Height: 6\' 3"  (1.905 m)   Body mass index is 24.6 kg/m.     11/26/2023    3:51 PM 05/28/2023    4:32 PM 04/25/2022    1:07 PM 04/17/2021    1:49 PM 03/28/2020    1:58 PM 03/26/2019    2:02 PM 03/20/2018    1:14 PM  Advanced Directives  Does Patient Have a Medical Advance Directive? No No No No No No No  Would patient like information on creating a medical advance directive? Yes (MAU/Ambulatory/Procedural Areas - Information given)  No - Patient declined  Yes (MAU/Ambulatory/Procedural Areas - Information given) Yes (MAU/Ambulatory/Procedural Areas - Information given) Yes (MAU/Ambulatory/Procedural Areas - Information given)    Current Medications (verified) Outpatient Encounter Medications as of 11/26/2023  Medication Sig   acetaminophen (TYLENOL) 500 MG tablet Take 1 tablet (500 mg total) by mouth every 6 (six) hours as needed for mild pain or headache.   albuterol (VENTOLIN HFA) 108 (90 Base) MCG/ACT inhaler Inhale 2 puffs into the lungs every 6 (six) hours as needed for wheezing or shortness of breath.   amLODipine (NORVASC) 5 MG tablet Take 1 tablet (5 mg total) by mouth daily.   aspirin EC 81 MG tablet Take 1 tablet (81 mg total) by mouth daily.   atorvastatin (LIPITOR) 20 MG tablet Take 1 tablet (20 mg total) by mouth daily.   azelastine (ASTELIN) 0.1 % nasal spray Place 2 sprays into both nostrils at bedtime.   chlorthalidone (HYGROTON) 25 MG tablet Take 1 tablet (25 mg total) by mouth daily.   cyanocobalamin 1000 MCG tablet Take 1,000 mcg by mouth every other day.   dapagliflozin propanediol (FARXIGA) 10 MG TABS  tablet Take 1 tablet (10 mg total) by mouth daily before breakfast.   gabapentin (NEURONTIN) 300 MG capsule Take one capsule at lunchtime and two capsules at bedtime   lidocaine (LIDODERM) 5 % Place 1 patch onto the skin daily. Remove & Discard patch within 12 hours or as directed by MD   loratadine (CLARITIN) 10 MG tablet Take 1 tablet (10 mg total) by mouth daily.   metFORMIN (GLUCOPHAGE) 1000 MG tablet Take 1 tablet (1,000 mg total) by mouth 2 (two) times daily with a meal.   metoprolol tartrate (LOPRESSOR) 25 MG tablet Take 0.5 tablets (12.5 mg total) by mouth 2 (two) times daily.   montelukast (SINGULAIR) 10 MG tablet Take 1 tablet (10 mg total) by mouth at bedtime.   nitroGLYCERIN (NITROSTAT) 0.4 MG SL tablet Place 1 tablet (0.4 mg total) under the tongue every 5 (five) minutes as needed for chest pain.   potassium chloride SA (KLOR-CON M) 20 MEQ tablet Take 1 tablet (20 mEq total) by mouth daily.   sildenafil (REVATIO) 20 MG tablet TAKE 1 TO 5 TABLETS AS NEEDED.   sitaGLIPtin (JANUVIA) 100 MG tablet Take 1 tablet (100 mg total) by mouth daily.   tamsulosin (FLOMAX) 0.4 MG CAPS capsule Take 1 capsule (0.4 mg total) by mouth daily.   tiZANidine (ZANAFLEX) 4 MG tablet Take 1 tablet (4 mg total) by mouth every 6 (six) hours as needed for muscle spasms.   No  facility-administered encounter medications on file as of 11/26/2023.    Allergies (verified) Lisinopril   History: Past Medical History:  Diagnosis Date   CAD (coronary artery disease)    a. 05/2016 Lexi MV: EF 58%, no ischemia/infarct; b. 05/2017 Cath: LM 20-30, LAD 50p, 48m, D1 50p, D2 80ost, LCX 90/70ost, RCA 80/90p/m, RPL1 50p, EF 50-55%; c. 06/2017 CABG x 4 (LIMA->LAD, VG->D2,VG->OM, VG->PDA).   Diabetes mellitus without complication (HCC)    Diastolic dysfunction    a. 05/2016 Echo: EF 55%, Gr1 DD, mild TR/MR.   Hyperlipidemia    Hypertension    Hypokalemia    Personal history of tobacco use, presenting hazards to health  05/03/2016   Post-op Afib    a. 06/2017 following CABG->short course of amio.   Past Surgical History:  Procedure Laterality Date   BACK SURGERY     CARDIAC CATHETERIZATION     CORONARY ARTERY BYPASS GRAFT N/A 07/01/2017   Procedure: CORONARY ARTERY BYPASS GRAFTING (CABG) x4, using left mammary artery and right greater saphenous vein harvested endoscopically;  Surgeon: Kerin Perna, MD;  Location: [facility] OR;  Service: Open Heart Surgery;  Laterality: N/A;   KNEE SURGERY     LEFT HEART CATH AND CORONARY ANGIOGRAPHY N/A 06/04/2017   Procedure: Left Heart Cath and Coronary Angiography;  Surgeon: Yvonne Kendall, MD;  Location: [facility] INVASIVE CV LAB;  Service: Cardiovascular;  Laterality: N/A;   TEE WITHOUT CARDIOVERSION N/A 07/01/2017   Procedure: TRANSESOPHAGEAL ECHOCARDIOGRAM (TEE);  Surgeon: Donata Clay, Theron Arista, MD;  Location: [facility] OR;  Service: Open Heart Surgery;  Laterality: N/A;   Family History  Problem Relation Age of Onset   Hypertension Mother    Diabetes Mother    Heart disease Father 42       multiple MI's   Social History   Socioeconomic History   Marital status: Widowed    Spouse name: Not on file   Number of children: 3   Years of education: Not on file   Highest education level: 10th grade  Occupational History   Occupation: works part time as Copy   Tobacco Use   Smoking status: Former    Current packs/day: 0.00    Average packs/day: 1 pack/day for 60.0 years (60.0 ttl pk-yrs)    Types: Cigarettes    Start date: 06/29/1957    Quit date: 06/29/2017    Years since quitting: 6.4   Smokeless tobacco: Never  Vaping Use   Vaping status: Never Used  Substance and Sexual Activity   Alcohol use: No    Comment: previously drank heavily but quit ~ 21 yrs ago.   Drug use: No   Sexual activity: Yes  Other Topics Concern   Not on file  Social History Narrative   Lives in [place] by himself.  Works as Copy 5 hours per day, 5 days per week. Does not routinely exercise.    Social Determinants of Health   Financial Resource Strain: Low Risk  (11/26/2023)   Overall Financial Resource Strain (CARDIA)    Difficulty of Paying Living Expenses: Not hard at all  Food Insecurity: No Food Insecurity (11/26/2023)   Hunger Vital Sign    Worried About Running Out of Food in the Last Year: Never true    Ran Out of Food in the Last Year: Never true  Transportation Needs: No Transportation Needs (11/26/2023)   PRAPARE - Administrator, Civil Service (Medical): No    Lack of Transportation (Non-Medical): No  Physical Activity: Sufficiently  Active (11/26/2023)   Exercise Vital Sign    Days of Exercise per Week: 5 days    Minutes of Exercise per Session: 30 min  Stress: No Stress Concern Present (11/26/2023)   [employer] of Occupational Health - Occupational Stress Questionnaire    Feeling of Stress : Not at all  Social Connections: Socially Isolated (11/26/2023)   Social Connection and Isolation Panel [NHANES]    Frequency of Communication with Friends and Family: Three times a week    Frequency of Social Gatherings with Friends and Family: More than three times a week    Attends Religious Services: Never    Database administrator or Organizations: No    Attends Banker Meetings: Never    Marital Status: Widowed    Tobacco Counseling Counseling given: Not Answered   Clinical Intake:  Pre-visit preparation completed: Yes  Pain : No/denies pain     BMI - recorded: 24.6 Nutritional Status: BMI of 19-24  Normal Nutritional Risks: None Diabetes: Yes CBG done?: No Did pt. bring in CBG monitor from home?: No  How often do you need to have someone help you when you read instructions, pamphlets, or other written materials from your doctor or pharmacy?: 1 - Never What is the last grade level you completed in school?: 10th  Interpreter Needed?: No  Information entered by :: Tora Kindred, CMA   Activities of Daily  Living    11/26/2023    3:39 PM  In your present state of health, do you have any difficulty performing the following activities:  Hearing? 0  Vision? 0  Difficulty concentrating or making decisions? 0  Walking or climbing stairs? 1  Dressing or bathing? 0  Doing errands, shopping? 0  Preparing Food and eating ? N  Using the Toilet? N  In the past six months, have you accidently leaked urine? N  Do you have problems with loss of bowel control? N  Managing your Medications? N  Managing your Finances? N  Housekeeping or managing your Housekeeping? N    Patient Care Team: Marjie Skiff, NP as PCP - General (Nurse Practitioner) End, Cristal Deer, MD as PCP - Cardiology (Cardiology) End, Cristal Deer, MD as Consulting Physician (Cardiology) Lovett Sox, MD as Consulting Physician (Cardiothoracic Surgery)  Indicate any recent Medical Services you may have received from other than [facility] providers in the past year (date may be approximate).     Assessment:   This is a routine wellness examination for Brendan [name].  Hearing/Vision screen Hearing Screening - Comments:: Slight hearing loss Vision Screening - Comments:: Gets eye exams   Goals Addressed               This Visit's Progress     Patient Stated (pt-stated)        Maintain current health      Depression Screen    11/26/2023    3:45 PM 10/14/2023    1:55 PM 09/04/2023    8:53 AM 07/17/2023    2:44 PM 06/04/2023    4:08 PM 05/24/2023    3:48 PM 04/15/2023    2:08 PM  PHQ 2/9 Scores  PHQ - 2 Score 0 0 0 0 0 0 0  PHQ- 9 Score 0 0 0 0 1 1 1     Fall Risk    11/26/2023    3:52 PM 09/04/2023    8:53 AM 07/17/2023    2:43 PM 05/24/2023    3:48 PM 04/15/2023    2:07 PM  Fall Risk   Falls in the past year? 0 0 0 0 0  Number falls in past yr: 0 0 0 0 0  Injury with Fall? 0 0 0 0 0  Risk for fall due to : No Fall Risks No Fall Risks No Fall Risks No Fall Risks No Fall Risks  Follow up Falls prevention discussed Falls  evaluation completed Falls evaluation completed Falls evaluation completed Falls evaluation completed    MEDICARE RISK AT HOME: Medicare Risk at Home Any stairs in or around the home?: Yes If so, are there any without handrails?: No Home free of loose throw rugs in walkways, pet beds, electrical cords, etc?: Yes Adequate lighting in your home to reduce risk of falls?: Yes Life alert?: No Use of a cane, walker or w/c?: No Grab bars in the bathroom?: No Shower chair or bench in shower?: No Elevated toilet seat or a handicapped toilet?: Yes  TIMED UP AND GO:  Was the test performed?  Yes  Length of time to ambulate 10 feet: 10 sec Gait slow and steady without use of assistive device    Cognitive Function:        11/26/2023    3:54 PM 04/25/2022    1:11 PM 04/17/2021    1:53 PM 03/26/2019    2:02 PM 03/20/2018    1:16 PM  6CIT Screen  What Year? 0 points 0 points 0 points 0 points 0 points  What month? 0 points 0 points 0 points 0 points 0 points  What time? 0 points 0 points 0 points 0 points 0 points  Count back from 20 0 points 0 points 0 points 0 points 0 points  Months in reverse 0 points 0 points 0 points 0 points 0 points  Repeat phrase 0 points 0 points 6 points 2 points 2 points  Total Score 0 points 0 points 6 points 2 points 2 points    Immunizations Immunization History  Administered Date(s) Administered   Fluad Quad(high Dose 65+) 11/23/2019, 12/06/2020, 09/18/2021, 10/12/2022   Fluad Trivalent(High Dose 65+) 09/04/2023   Influenza, High Dose Seasonal PF 09/17/2016, 11/20/2017, 11/24/2018   Influenza,inj,Quad PF,6+ Mos 11/30/2015   Influenza-Unspecified 11/11/2013, 11/30/2015   Moderna Covid-19 Fall Seasonal Vaccine 28yrs & older 01/10/2023   Moderna SARS-COV2 Booster Vaccination 10/02/2021   Moderna Sars-Covid-2 Vaccination 02/27/2020, 03/26/2020, 11/10/2020, 04/24/2021   Pneumococcal Conjugate-13 05/10/2014   Pneumococcal Polysaccharide-23 09/15/2007,  03/19/2017   Td 08/16/2005, 03/30/2022   Zoster, Live 07/31/2011    TDAP status: Up to date  Flu Vaccine status: Up to date  Pneumococcal vaccine status: Up to date  Covid-19 vaccine status: Information provided on how to obtain vaccines.   Qualifies for Shingles Vaccine? Yes   Zostavax completed Yes   Shingrix Completed?: No.    Education has been provided regarding the importance of this vaccine. Patient has been advised to call insurance company to determine out of pocket expense if they have not yet received this vaccine. Advised may also receive vaccine at local pharmacy or Health Dept. Verbalized acceptance and understanding.  Screening Tests Health Maintenance  Topic Date Due   Zoster Vaccines- Shingrix (1 of 2) 05/19/1970   COVID-19 Vaccine (6 - 2023-24 season) 09/01/2023   OPHTHALMOLOGY EXAM  01/05/2024   HEMOGLOBIN A1C  04/13/2024   Diabetic kidney evaluation - Urine ACR  04/14/2024   FOOT EXAM  04/14/2024   Colonoscopy  04/15/2024   Lung Cancer Screening  08/08/2024   Diabetic kidney evaluation - eGFR  measurement  10/13/2024   Medicare Annual Wellness (AWV)  11/25/2024   DTaP/Tdap/Td (3 - Tdap) 03/30/2032   Pneumonia Vaccine 46+ Years old  Completed   INFLUENZA VACCINE  Completed   Hepatitis C Screening  Completed   HPV VACCINES  Aged Out    Health Maintenance  Health Maintenance Due  Topic Date Due   Zoster Vaccines- Shingrix (1 of 2) 05/19/1970   COVID-19 Vaccine (6 - 2023-24 season) 09/01/2023    Colorectal cancer screening: Type of screening: Colonoscopy. Completed 04/15/14. Repeat every 10 years  Lung Cancer Screening: (Low Dose CT Chest recommended if Age 60-80 years, 20 pack-year currently smoking OR have quit w/in 15years.) does qualify. Done 08/09/23  Lung Cancer Screening Referral: order placed 08/15/23  Additional Screening:  Hepatitis C Screening: does not qualify; Completed 11/30/15  Vision Screening: Recommended annual ophthalmology exams  for early detection of glaucoma and other disorders of the eye. Is the patient up to date with their annual eye exam?  Yes  Who is the provider or what is the name of the office in which the patient attends annual eye exams? [facility] If pt is not established with a provider, would they like to be referred to a provider to establish care? No .   Dental Screening: Recommended annual dental exams for proper oral hygiene  Diabetic Foot Exam: Diabetic Foot Exam: Completed 04/15/23  Community Resource Referral / Chronic Care Management: CRR required this visit?  Yes   CCM required this visit?  No     Plan:     I have personally reviewed and noted the following in the patient's chart:   Medical and social history Use of alcohol, tobacco or illicit drugs  Current medications and supplements including opioid prescriptions. Patient is not currently taking opioid prescriptions. Functional ability and status Nutritional status Physical activity Advanced directives List of other physicians Hospitalizations, surgeries, and ER visits in previous 12 months Vitals Screenings to include cognitive, depression, and falls Referrals and appointments  In addition, I have reviewed and discussed with patient certain preventive protocols, quality metrics, and best practice recommendations. A written personalized care plan for preventive services as well as general preventive health recommendations were provided to patient.     Tora Kindred, CMA   11/26/2023   After Visit Summary: (In Person-Printed) AVS printed and given to the patient  Nurse Notes:  Declined DM & Nutrition education Patient requested help with getting handrails put in his shower. I placed a community resources referral.

## 2023-11-26 NOTE — Patient Instructions (Addendum)
Mr. Yust , Thank you for taking time to come for your Medicare Wellness Visit. I appreciate your ongoing commitment to your health goals. Please review the following plan we discussed and let me know if I can assist you in the future.   Referrals/Orders/Follow-Ups/Clinician Recommendations: I have placed a referral to community resources to see if they can get you help with getting handrails for you shower.  This is a list of the screening recommended for you and due dates:  Health Maintenance  Topic Date Due   Zoster (Shingles) Vaccine (1 of 2) 05/19/1970   COVID-19 Vaccine (6 - 2023-24 season) 09/01/2023   Eye exam for diabetics  01/05/2024   Hemoglobin A1C  04/13/2024   Yearly kidney health urinalysis for diabetes  04/14/2024   Complete foot exam   04/14/2024   Colon Cancer Screening  04/15/2024   Screening for Lung Cancer  08/08/2024   Yearly kidney function blood test for diabetes  10/13/2024   Medicare Annual Wellness Visit  11/25/2024   DTaP/Tdap/Td vaccine (3 - Tdap) 03/30/2032   Pneumonia Vaccine  Completed   Flu Shot  Completed   Hepatitis C Screening  Completed   HPV Vaccine  Aged Out    Advanced directives: (Provided) Advance directive discussed with you today. I have provided a copy for you to complete at home and have notarized. Once this is complete, please bring a copy in to our office so we can scan it into your chart.   Next Medicare Annual Wellness Visit scheduled for next year: Yes, 12/01/24 @ 1:50pm

## 2023-11-26 NOTE — Telephone Encounter (Signed)
Requested Prescriptions  Pending Prescriptions Disp Refills   tamsulosin (FLOMAX) 0.4 MG CAPS capsule [Pharmacy Med Name: TAMSULOSIN HCL 0.4 MG CAPSULE] 90 capsule 0    Sig: Take 1 capsule (0.4 mg total) by mouth daily.     Urology: Alpha-Adrenergic Blocker Passed - 11/25/2023  2:50 PM      Passed - PSA in normal range and within 360 days    PSA  Date Value Ref Range Status  05/17/2015 from [facility]  Final   Prostate Specific Ag, Serum  Date Value Ref Range Status  04/15/2023 0.4 0.0 - 4.0 ng/mL Final    Comment:    Roche ECLIA methodology. According to the American Urological Association, Serum PSA should decrease and remain at undetectable levels after radical prostatectomy. The AUA defines biochemical recurrence as an initial PSA value 0.2 ng/mL or greater followed by a subsequent confirmatory PSA value 0.2 ng/mL or greater. Values obtained with different assay methods or kits cannot be used interchangeably. Results cannot be interpreted as absolute evidence of the presence or absence of malignant disease.          Passed - Last BP in normal range    BP Readings from Last 1 Encounters:  10/14/23 131/78         Passed - Valid encounter within last 12 months    Recent Outpatient Visits           1 month ago Controlled type 2 diabetes with neuropathy (HCC)   [facility] [place], Corrie Dandy T, NP   2 months ago Neck pain   [facility] [place], Marlin T, NP   3 months ago Controlled type 2 diabetes with neuropathy (HCC)   [place] [facility] [place], Corrie Dandy T, NP   4 months ago Controlled type 2 diabetes with neuropathy [facility])   [facility] [place], Corrie Dandy T, NP   5 months ago Dizziness   [place] [facility] [place], [place], DO       Future Appointments             In 1 month [name], Dorie Rank, NP [facility] [place], [facility]

## 2023-11-27 ENCOUNTER — Telehealth: Payer: Self-pay | Admitting: *Deleted

## 2023-11-27 NOTE — Progress Notes (Signed)
  Care Coordination  Outreach Note  11/27/2023 Name: Brendan [name] MRN: [id number] DOB: [birth year]   Care Coordination Outreach Attempts: An unsuccessful telephone outreach was attempted today to offer the patient information about available care coordination services.  Follow Up Plan:  Additional outreach attempts will be made to offer the patient care coordination information and services.   Encounter Outcome:  No Answer  Burman Nieves, CCMA Care Coordination Care Guide Direct Dial: [phone]

## 2023-12-02 NOTE — Progress Notes (Unsigned)
  Care Coordination  Outreach Note  12/02/2023 Name: Brendan [name] MRN: [id number] DOB: 1951/03/20   Care Coordination Outreach Attempts: A second unsuccessful outreach was attempted today to offer the patient with information about available care coordination services.  Follow Up Plan:  Additional outreach attempts will be made to offer the patient care coordination information and services.   Encounter Outcome:  No Answer  Burman Nieves, CCMA Care Coordination Care Guide Direct Dial: [phone]

## 2023-12-03 NOTE — Progress Notes (Signed)
  Care Coordination  Outreach Note  12/03/2023 Name: Lamel Hayenga MRN: [id number] DOB: [birth year]   Care Coordination Outreach Attempts: A third unsuccessful outreach was attempted today to offer the patient with information about available care coordination services.  Follow Up Plan:  No further outreach attempts will be made at this time. We have been unable to contact the patient to offer or enroll patient in care coordination services  Encounter Outcome:  No Answer  Burman Nieves, [facility] Care Coordination Care Guide Direct Dial: [phone]

## 2023-12-11 ENCOUNTER — Other Ambulatory Visit: Payer: Self-pay | Admitting: Nurse Practitioner

## 2023-12-12 NOTE — Telephone Encounter (Signed)
Requested Prescriptions  Pending Prescriptions Disp Refills   atorvastatin (LIPITOR) 20 MG tablet [Pharmacy Med Name: ATORVASTATIN 20 MG TABLET] 90 tablet 2    Sig: Take 1 tablet (20 mg total) by mouth daily.     Cardiovascular:  Antilipid - Statins Failed - 12/12/2023  9:59 AM      Failed - Lipid Panel in normal range within the last 12 months    Cholesterol, Total  Date Value Ref Range Status  10/14/2023 101 100 - 199 mg/dL Final   Cholesterol Piccolo, Waived  Date Value Ref Range Status  02/29/2016 111 <200 mg/dL Final    Comment:                            Desirable                <200                         Borderline High      200- 239                         High                     >239    LDL Chol Calc (NIH)  Date Value Ref Range Status  10/14/2023 50 0 - 99 mg/dL Final   HDL  Date Value Ref Range Status  10/14/2023 30 (L) >39 mg/dL Final   Triglycerides  Date Value Ref Range Status  10/14/2023 112 0 - 149 mg/dL Final   Triglycerides Piccolo,Waived  Date Value Ref Range Status  02/29/2016 73 <150 mg/dL Final    Comment:                            Normal                   <150                         Borderline High     150 - 199                         High                200 - 499                         Very High                >499          Passed - Patient is not pregnant      Passed - Valid encounter within last 12 months    Recent Outpatient Visits           1 month ago Controlled type 2 diabetes with neuropathy (HCC)   [facility] [place] [facility] [place], Dorie Rank, NP   3 months ago Neck pain   [facility] [place], [place] T, NP   4 months ago Controlled type 2 diabetes with neuropathy (HCC)   [facility] [place], [name], NP   4 months ago Controlled type 2 diabetes with neuropathy [facility])    [facility] [place], Dorie Rank, NP  6 months ago Dizziness   [facility] [place], [place], DO       Future Appointments             In 1 month [name], Dorie Rank, NP [place] [facility], [facility]

## 2023-12-26 ENCOUNTER — Other Ambulatory Visit: Payer: Self-pay | Admitting: Internal Medicine

## 2024-01-09 ENCOUNTER — Ambulatory Visit: Payer: Medicare HMO | Admitting: Podiatry

## 2024-01-11 NOTE — Patient Instructions (Signed)
 Be Involved in Caring For Your Health:  Taking Medications When medications are taken as directed, they can greatly improve your health. But if they are not taken as prescribed, they may not work. In some cases, not taking them correctly can be harmful. To help ensure your treatment remains effective and safe, understand your medications and how to take them. Bring your medications to each visit for review by your provider.  Your lab results, notes, and after visit summary will be available on My Chart. We strongly encourage you to use this feature. If lab results are abnormal the clinic will contact you with the appropriate steps. If the clinic does not contact you assume the results are satisfactory. You can always view your results on My Chart. If you have questions regarding your health or results, please contact the clinic during office hours. You can also ask questions on My Chart.  We at [facility] are grateful that you chose [place] to provide your care. We strive to provide evidence-based and compassionate care and are always looking for feedback. If you get a survey from the clinic please complete this so we can hear your opinions.  Diabetes Mellitus and Foot Care Diabetes, also called diabetes mellitus, may cause problems with your feet and legs because of poor blood flow (circulation). Poor circulation may make your skin: Become thinner and drier. Break more easily. Heal more slowly. Peel and crack. You may also have nerve damage (neuropathy). This can cause decreased feeling in your legs and feet. This means that you may not notice minor injuries to your feet that could lead to more serious problems. Finding and treating problems early is the best way to prevent future foot problems. How to care for your feet Foot hygiene  Wash your feet daily with warm water and mild soap. Do not use hot water. Then, pat your feet and the areas between your toes until they are fully dry. Do  not soak your feet. This can dry your skin. Trim your toenails straight across. Do not dig under them or around the cuticle. File the edges of your nails with an emery board or nail file. Apply a moisturizing lotion or petroleum jelly to the skin on your feet and to dry, brittle toenails. Use lotion that does not contain alcohol and is unscented. Do not apply lotion between your toes. Shoes and socks Wear clean socks or stockings every day. Make sure they are not too tight. Do not wear knee-high stockings. These may decrease blood flow to your legs. Wear shoes that fit well and have enough cushioning. Always look in your shoes before you put them on to be sure there are no objects inside. To break in new shoes, wear them for just a few hours a day. This prevents injuries on your feet. Wounds, scrapes, corns, and calluses  Check your feet daily for blisters, cuts, bruises, sores, and redness. If you cannot see the bottom of your feet, use a mirror or ask someone for help. Do not cut off corns or calluses or try to remove them with medicine. If you find a minor scrape, cut, or break in the skin on your feet, keep it and the skin around it clean and dry. You may clean these areas with mild soap and water. Do not clean the area with peroxide, alcohol, or iodine. If you have a wound, scrape, corn, or callus on your foot, look at it several times a day to make sure it  is healing and not infected. Check for: Redness, swelling, or pain. Fluid or blood. Warmth. Pus or a bad smell. General tips Do not cross your legs. This may decrease blood flow to your feet. Do not use heating pads or hot water bottles on your feet. They may burn your skin. If you have lost feeling in your feet or legs, you may not know this is happening until it is too late. Protect your feet from hot and cold by wearing shoes, such as at the beach or on hot pavement. Schedule a complete foot exam at least once a year or more often if  you have foot problems. Report any cuts, sores, or bruises to your health care provider right away. Where to find more information American Diabetes Association: diabetes.org Association of Diabetes Care & Education Specialists: diabeteseducator.org Contact a health care provider if: You have a condition that increases your risk of infection, and you have any cuts, sores, or bruises on your feet. You have an injury that is not healing. You have redness on your legs or feet. You feel burning or tingling in your legs or feet. You have pain or cramps in your legs and feet. Your legs or feet are numb. Your feet always feel cold. You have pain around any toenails. Get help right away if: You have a wound, scrape, corn, or callus on your foot and: You have signs of infection. You have a fever. You have a red line going up your leg. This information is not intended to replace advice given to you by your health care provider. Make sure you discuss any questions you have with your health care provider. Document Revised: 06/20/2022 Document Reviewed: 06/20/2022 Elsevier Patient Education  2024 [employer].

## 2024-01-13 DIAGNOSIS — H1045 Other chronic allergic conjunctivitis: Secondary | ICD-10-CM | POA: Diagnosis not present

## 2024-01-13 DIAGNOSIS — H35033 Hypertensive retinopathy, bilateral: Secondary | ICD-10-CM | POA: Diagnosis not present

## 2024-01-13 DIAGNOSIS — H26493 Other secondary cataract, bilateral: Secondary | ICD-10-CM | POA: Diagnosis not present

## 2024-01-13 DIAGNOSIS — E119 Type 2 diabetes mellitus without complications: Secondary | ICD-10-CM | POA: Diagnosis not present

## 2024-01-13 LAB — HM DIABETES EYE EXAM

## 2024-01-14 ENCOUNTER — Encounter: Payer: Self-pay | Admitting: Nurse Practitioner

## 2024-01-14 ENCOUNTER — Ambulatory Visit (INDEPENDENT_AMBULATORY_CARE_PROVIDER_SITE_OTHER): Payer: Medicare HMO | Admitting: Nurse Practitioner

## 2024-01-14 VITALS — BP 137/68 | HR 56 | Temp 97.8°F | Ht 75.0 in | Wt 194.8 lb

## 2024-01-14 DIAGNOSIS — E119 Type 2 diabetes mellitus without complications: Secondary | ICD-10-CM | POA: Diagnosis not present

## 2024-01-14 DIAGNOSIS — E1140 Type 2 diabetes mellitus with diabetic neuropathy, unspecified: Secondary | ICD-10-CM

## 2024-01-14 DIAGNOSIS — I700 Atherosclerosis of aorta: Secondary | ICD-10-CM

## 2024-01-14 DIAGNOSIS — E1159 Type 2 diabetes mellitus with other circulatory complications: Secondary | ICD-10-CM

## 2024-01-14 DIAGNOSIS — I5032 Chronic diastolic (congestive) heart failure: Secondary | ICD-10-CM

## 2024-01-14 DIAGNOSIS — I77810 Thoracic aortic ectasia: Secondary | ICD-10-CM

## 2024-01-14 DIAGNOSIS — E538 Deficiency of other specified B group vitamins: Secondary | ICD-10-CM | POA: Diagnosis not present

## 2024-01-14 DIAGNOSIS — Z1211 Encounter for screening for malignant neoplasm of colon: Secondary | ICD-10-CM

## 2024-01-14 DIAGNOSIS — I152 Hypertension secondary to endocrine disorders: Secondary | ICD-10-CM | POA: Diagnosis not present

## 2024-01-14 DIAGNOSIS — I739 Peripheral vascular disease, unspecified: Secondary | ICD-10-CM

## 2024-01-14 DIAGNOSIS — E279 Disorder of adrenal gland, unspecified: Secondary | ICD-10-CM

## 2024-01-14 DIAGNOSIS — E785 Hyperlipidemia, unspecified: Secondary | ICD-10-CM | POA: Diagnosis not present

## 2024-01-14 DIAGNOSIS — E1169 Type 2 diabetes mellitus with other specified complication: Secondary | ICD-10-CM | POA: Diagnosis not present

## 2024-01-14 DIAGNOSIS — J432 Centrilobular emphysema: Secondary | ICD-10-CM | POA: Diagnosis not present

## 2024-01-14 LAB — MICROALBUMIN, URINE WAIVED
Creatinine, Urine Waived: 100 mg/dL (ref 10–300)
Microalb, Ur Waived: 10 mg/L (ref 0–19)
Microalb/Creat Ratio: <30 mg/g (ref <30)

## 2024-01-14 LAB — BAYER DCA HB A1C WAIVED: HB A1C (BAYER DCA - WAIVED): 6.1 % — ABNORMAL HIGH (ref 4.8–5.6)

## 2024-01-14 MED ORDER — SITAGLIPTIN PHOSPHATE 100 MG PO TABS: 100.0000 mg | ORAL_TABLET | Freq: Every day | ORAL | 4 refills | Status: DC

## 2024-01-14 NOTE — Assessment & Plan Note (Signed)
 Chronic, stable.  BP at goal in office and on home readings.  Continue current medication regimen and adjust as needed.  Continue collaboration with cardiology, recent notes reviewed.  Recommend he monitor BP at least a few times a week at home and document.  DASH diet focus.  LABS: CMP, CBC, TSH.  Urine ALB 10 January 2024

## 2024-01-14 NOTE — Assessment & Plan Note (Signed)
Chronic, ongoing.  Continue current medication regimen and adjust as needed.  Lipid panel today.  Recent LDL <70.

## 2024-01-14 NOTE — Assessment & Plan Note (Signed)
 Refer to diabetes with neuropathy plan of care

## 2024-01-14 NOTE — Assessment & Plan Note (Signed)
Followed by cardiology, continue this collaboration.  Recent notes and testing reviewed.

## 2024-01-14 NOTE — Assessment & Plan Note (Signed)
Chronic, stable.  Continue statin and ASA daily at home for prevention + collaboration with podiatry.

## 2024-01-14 NOTE — Assessment & Plan Note (Signed)
Chronic, stable.  Euvolemic on exam today.  Continue collaboration with cardiology and current medication regimen as ordered by them, recent notes and testing reviewed.  On Farxiga for prevention.  Recommend: - Reminded to call for an overnight weight gain of >2 pounds or a weekly weight weight of >5 pounds - not adding salt to his food and has been reading food labels. Reviewed the importance of keeping daily sodium intake to 2000mg  daily  - Avoid Ibuprofen medications

## 2024-01-14 NOTE — Progress Notes (Signed)
 BP 137/68   Pulse (!) 56   Temp 97.8 F (36.6 C) (Oral)   Ht 6' 3 (1.905 m)   Wt 194 lb 12.8 oz (88.4 kg)   SpO2 92%   BMI 24.35 kg/m    Subjective:    Patient ID: Brendan [name], male    DOB: Feb 22, 1951, 73 y.o.   MRN: [id number]  HPI: Brendan [name] is a 73 y.o. male  Chief Complaint  Patient presents with   Coronary Artery Disease   Diabetes    Eye exam requested from Dr. Mevelyn. States he had his exam yesterday   Hyperlipidemia   Hypertension   DIABETES A1c 7% October.  Continues on Metformin  1000 MG BID, Januvia  100 MG daily, and Farxiga  10 MG daily.  Takes Gabapentin  300 MG in morning and 600 MG at bedtime + B12 for neuropathy. Hypoglycemic episodes: rare 60 Polydipsia/polyuria: no Visual disturbance: no Chest pain: no Paresthesias: no Glucose Monitoring: yes             Accucheck frequency: Daily             Fasting glucose: 101 this morning and 99 yesterday             Post prandial:              Evening:             Before meals: Taking Insulin ?: no             Long acting insulin :             Short acting insulin : Blood Pressure Monitoring: not checking Retinal Examination: Up To Date -- Dr. Mevelyn New Exam: Up to Date Pneumovax: Up to Date Influenza: Up to Date Aspirin : yes    HYPERTENSION / HYPERLIPIDEMIA Continues Metoprolol , Hygroton , Amlodipine , and Lipitor. Has never used NTG.  Allergic to Lisinopril .   Followed by cardiology, [facility], with last visit 06/17/23, had stress test and Myo -- EF 66%. Satisfied with current treatment? yes Duration of hypertension: chronic BP monitoring frequency: every 2-3 days BP range: 120-130/70-80 range on average BP medication side effects: no Duration of hyperlipidemia: chronic Cholesterol medication side effects: no Cholesterol supplements: none Medication compliance: good compliance Aspirin : yes Recent stressors: no Recurrent headaches: no Visual changes: no Palpitations: no Dyspnea: no Chest pain:  no Lower extremity edema: no Dizzy/lightheaded: no   COPD Continues on Singulair  and Albuterol . Last lung cancer screening 08/09/23 -- noted aortic atherosclerosis + emphysema.  Noted in past hepatic steatosis + left adrenal nodule which is stable at 1.8 cm -- not noted on recent scan.    Has not smoked in > 7 years since bypass surgery. COPD status: stable Satisfied with current treatment?: yes Oxygen  use: no Dyspnea frequency: no Cough frequency: no Rescue inhaler frequency: no Limitation of activity: no Productive cough: no Last Spirometry: April 2024 - FEV1 54% and FEV1/FVC 59% Pneumovax: Up to Date Influenza: Up to Date      01/14/2024    2:27 PM 11/26/2023    3:45 PM 10/14/2023    1:55 PM 09/04/2023    8:53 AM 07/17/2023    2:44 PM  Depression screen PHQ 2/9  Decreased Interest 0 0 0 0 0  Down, Depressed, Hopeless 0 0 0 0 0  PHQ - 2 Score 0 0 0 0 0  Altered sleeping 0 0 0 0 0  Tired, decreased energy 0 0 0 0 0  Change in appetite 0 0 0 0  0  Feeling bad or failure about yourself  0 0 0 0 0  Trouble concentrating 0 0 0 0 0  Moving slowly or fidgety/restless 0 0 0 0 0  Suicidal thoughts 0 0 0 0 0  PHQ-9 Score 0 0 0 0 0  Difficult doing work/chores Not difficult at all Not difficult at all  Not difficult at all Not difficult at all       01/14/2024    2:28 PM 10/14/2023    1:55 PM 09/04/2023    8:54 AM 07/17/2023    2:44 PM  GAD 7 : Generalized Anxiety Score  Nervous, Anxious, on Edge 0 0 0 0  Control/stop worrying 0 0 0 0  Worry too much - different things 0 0 0 0  Trouble relaxing 0 0 0 0  Restless 0 0 0 0  Easily annoyed or irritable 0 0 0 0  Afraid - awful might happen 0 0 0 0  Total GAD 7 Score 0 0 0 0  Anxiety Difficulty Not difficult at all  Not difficult at all Not difficult at all   Relevant past medical, surgical, family and social history reviewed and updated as indicated. Interim medical history since our last visit reviewed. Allergies and medications  reviewed and updated.  Review of Systems  Constitutional:  Negative for activity change, diaphoresis, fatigue and fever.  Respiratory:  Negative for cough, chest tightness, shortness of breath and wheezing.   Cardiovascular:  Negative for chest pain, palpitations and leg swelling.  Gastrointestinal: Negative.   Endocrine: Negative for cold intolerance, heat intolerance, polydipsia, polyphagia and polyuria.  Neurological: Negative.   Psychiatric/Behavioral: Negative.     Per HPI unless specifically indicated above     Objective:    BP 137/68   Pulse (!) 56   Temp 97.8 F (36.6 C) (Oral)   Ht 6' 3 (1.905 m)   Wt 194 lb 12.8 oz (88.4 kg)   SpO2 92%   BMI 24.35 kg/m   Wt Readings from Last 3 Encounters:  01/14/24 194 lb 12.8 oz (88.4 kg)  11/26/23 196 lb 12.8 oz (89.3 kg)  10/14/23 194 lb 1.6 oz (88 kg)    Physical Exam Vitals and nursing note reviewed.  Constitutional:      General: He is awake. He is not in acute distress.    Appearance: He is well-developed and overweight. He is not ill-appearing.  HENT:     Head: Normocephalic and atraumatic.     Right Ear: Hearing normal. No drainage.     Left Ear: Hearing normal. No drainage.  Eyes:     General: Lids are normal.        Right eye: No discharge.        Left eye: No discharge.     Conjunctiva/sclera: Conjunctivae normal.     Pupils: Pupils are equal, round, and reactive to light.  Neck:     Vascular: No carotid bruit.  Cardiovascular:     Rate and Rhythm: Regular rhythm. Bradycardia present.     Heart sounds: Normal heart sounds, S1 normal and S2 normal. No murmur heard.    No gallop.  Pulmonary:     Effort: Pulmonary effort is normal. No accessory muscle usage or respiratory distress.     Breath sounds: Normal breath sounds. No decreased breath sounds, wheezing or rhonchi.  Abdominal:     General: Bowel sounds are normal. There is no distension.     Palpations: Abdomen is soft.  Tenderness: There is no  abdominal tenderness.  Musculoskeletal:     Cervical back: Normal range of motion and neck supple.     Right lower leg: No edema.     Left lower leg: No edema.  Skin:    General: Skin is warm and dry.  Neurological:     Mental Status: He is alert and oriented to person, place, and time.  Psychiatric:        Attention and Perception: Attention normal.        Mood and Affect: Mood normal.        Speech: Speech normal.        Behavior: Behavior normal. Behavior is cooperative.    Results for orders placed or performed in visit on 10/14/23  HgB A1c   Collection Time: 10/14/23  2:31 PM  Result Value Ref Range   Hgb A1c MFr Bld 7.0 (H) 4.8 - 5.6 %   Est. average glucose Bld gHb Est-mCnc 154 mg/dL  Comprehensive metabolic panel   Collection Time: 10/14/23  2:31 PM  Result Value Ref Range   Glucose 88 70 - 99 mg/dL   BUN 28 (H) 8 - 27 mg/dL   Creatinine, Ser 8.54 (H) 0.76 - 1.27 mg/dL   eGFR 51 (L) >40 fO/fpw/8.26   BUN/Creatinine Ratio 19 10 - 24   Sodium 143 134 - 144 mmol/L   Potassium 4.0 3.5 - 5.2 mmol/L   Chloride 104 96 - 106 mmol/L   CO2 23 20 - 29 mmol/L   Calcium  9.6 8.6 - 10.2 mg/dL   Total Protein 6.3 6.0 - 8.5 g/dL   Albumin  4.5 3.8 - 4.8 g/dL   Globulin, Total 1.8 1.5 - 4.5 g/dL   Bilirubin Total 0.3 0.0 - 1.2 mg/dL   Alkaline Phosphatase 56 44 - 121 IU/L   AST 14 0 - 40 IU/L   ALT 17 0 - 44 IU/L  Lipid Panel w/o Chol/HDL Ratio   Collection Time: 10/14/23  2:31 PM  Result Value Ref Range   Cholesterol, Total 101 100 - 199 mg/dL   Triglycerides 887 0 - 149 mg/dL   HDL 30 (L) >60 mg/dL   VLDL Cholesterol Cal 21 5 - 40 mg/dL   LDL Chol Calc (NIH) 50 0 - 99 mg/dL      Assessment & Plan:   Problem List Items Addressed This Visit       Cardiovascular and Mediastinum   Aortic atherosclerosis (HCC) (Chronic)   Chronic.  Noted on CT imaging.  Continue ASA and Lipitor daily for prevention.  Quit smoking >5 years ago.        Chronic heart failure with  preserved ejection fraction (HFpEF) (HCC) (Chronic)   Chronic, stable.  Euvolemic on exam today.  Continue collaboration with cardiology and current medication regimen as ordered by them, recent notes and testing reviewed.  On Farxiga  for prevention.  Recommend: - Reminded to call for an overnight weight gain of >2 pounds or a weekly weight weight of >5 pounds - not adding salt to his food and has been reading food labels. Reviewed the importance of keeping daily sodium intake to 2000mg  daily  - Avoid Ibuprofen medications      Dilated aortic root (HCC) (Chronic)   Followed by cardiology, continue this collaboration.  Recent notes and testing reviewed.      Hypertension associated with diabetes (HCC) (Chronic)   Chronic, stable.  BP at goal in office and on home readings.  Continue current medication regimen  and adjust as needed.  Continue collaboration with cardiology, recent notes reviewed.  Recommend he monitor BP at least a few times a week at home and document.  DASH diet focus.  LABS: CMP, CBC, TSH.  Urine ALB 10 January 2024      Relevant Medications   sitaGLIPtin  (JANUVIA ) 100 MG tablet   Other Relevant Orders   Bayer DCA Hb A1c Waived   Microalbumin, Urine Waived   CBC with Differential/Platelet   Comprehensive metabolic panel   TSH   Peripheral vascular disease (HCC) (Chronic)   Chronic, stable.  Continue statin and ASA daily at home for prevention + collaboration with podiatry.        Respiratory   Centrilobular emphysema (HCC) (Chronic)   Chronic, stable.  April 2024 - FEV1 54% and FEV1/FVC 59%, change from in 2022 was FEV1 64% and FEV1/FVC 73%.  Continue Albuterol  and recommend he use this if wheezing present or SOB.  Continue cessation of smoking and continue yearly lung screening.  Spirometry annually.  Have recommend a daily inhaler for maintenance and to help lung function, he refuses.        Endocrine   Controlled type 2 diabetes with neuropathy (HCC) - Primary  (Chronic)   Chronic, ongoing.  A1c 6.1% today, trend down from 6.6%. Urine ALB 10 January 2024. Continue Metformin , Farxiga , and Januvia .  Adjust Metformin  or Januvia  based on kidney function.  Current CrCl 57 and eGFR 51.  Recommend heavy focus on diet with less pasta and potatoes.  Continue Gabapentin  as prescribed by podiatry and adjust as needed based on renal function.  Check BS BID and document for next visit.   - Eye and foot exams up to date - Vaccinations up to date - Statin on board, allergic to ACE      Relevant Medications   sitaGLIPtin  (JANUVIA ) 100 MG tablet   Other Relevant Orders   Bayer DCA Hb A1c Waived   Microalbumin, Urine Waived   Hyperlipidemia associated with type 2 diabetes mellitus (HCC) (Chronic)   Chronic, ongoing.  Continue current medication regimen and adjust as needed.  Lipid panel today.  Recent LDL <70.      Relevant Medications   sitaGLIPtin  (JANUVIA ) 100 MG tablet   Other Relevant Orders   Bayer DCA Hb A1c Waived   Comprehensive metabolic panel   Lipid Panel w/o Chol/HDL Ratio   Diabetes mellitus treated with oral medication (HCC)   Refer to diabetes with neuropathy plan of care.      Relevant Medications   sitaGLIPtin  (JANUVIA ) 100 MG tablet     Other   Adrenal nodule (HCC) (Chronic)   Left side, noted on CT lung screening, stable on recent check = possible adenoma reported.  Continue to monitor on screenings and send to nephrology as needed.      B12 deficiency (Chronic)   Ongoing, continue supplement.  Recheck level today.      Relevant Orders   Vitamin B12   Other Visit Diagnoses       Screen for colon cancer       GI referral placed   Relevant Orders   Ambulatory referral to Gastroenterology        Follow up plan: Return in about 3 months (around 04/13/2024) for T2DM, HTN/HLD, COPD.

## 2024-01-14 NOTE — Assessment & Plan Note (Signed)
 Chronic, stable.  April 2024 - FEV1 54% and FEV1/FVC 59%, change from in 2022 was FEV1 64% and FEV1/FVC 73%.  Continue Albuterol  and recommend he use this if wheezing present or SOB.  Continue cessation of smoking and continue yearly lung screening.  Spirometry annually.  Have recommend a daily inhaler for maintenance and to help lung function, he refuses.

## 2024-01-14 NOTE — Assessment & Plan Note (Signed)
Chronic.  Noted on CT imaging.  Continue ASA and Lipitor daily for prevention.  Quit smoking >5 years ago.

## 2024-01-14 NOTE — Assessment & Plan Note (Signed)
 Chronic, ongoing.  A1c 6.1% today, trend down from 6.6%. Urine ALB 10 January 2024. Continue Metformin , Farxiga , and Januvia .  Adjust Metformin  or Januvia  based on kidney function.  Current CrCl 57 and eGFR 51.  Recommend heavy focus on diet with less pasta and potatoes.  Continue Gabapentin  as prescribed by podiatry and adjust as needed based on renal function.  Check BS BID and document for next visit.   - Eye and foot exams up to date - Vaccinations up to date - Statin on board, allergic to ACE

## 2024-01-14 NOTE — Assessment & Plan Note (Signed)
Ongoing, continue supplement.  Recheck level today.

## 2024-01-14 NOTE — Assessment & Plan Note (Signed)
Left side, noted on CT lung screening, stable on recent check = possible adenoma reported.  Continue to monitor on screenings and send to nephrology as needed. °

## 2024-01-15 LAB — COMPREHENSIVE METABOLIC PANEL
ALT: 15 [IU]/L (ref 0–44)
AST: 13 [IU]/L (ref 0–40)
Albumin: 4.7 g/dL (ref 3.8–4.8)
Alkaline Phosphatase: 66 [IU]/L (ref 44–121)
BUN/Creatinine Ratio: 20 (ref 10–24)
BUN: 29 mg/dL — ABNORMAL HIGH (ref 8–27)
Bilirubin Total: 0.3 mg/dL (ref 0.0–1.2)
CO2: 22 mmol/L (ref 20–29)
Calcium: 9.6 mg/dL (ref 8.6–10.2)
Chloride: 105 mmol/L (ref 96–106)
Creatinine, Ser: 1.42 mg/dL — ABNORMAL HIGH (ref 0.76–1.27)
Globulin, Total: 1.9 g/dL (ref 1.5–4.5)
Glucose: 87 mg/dL (ref 70–99)
Potassium: 3.9 mmol/L (ref 3.5–5.2)
Sodium: 144 mmol/L (ref 134–144)
Total Protein: 6.6 g/dL (ref 6.0–8.5)
eGFR: 53 mL/min/{1.73_m2} — ABNORMAL LOW (ref >59)

## 2024-01-15 LAB — CBC WITH DIFFERENTIAL/PLATELET
Basophils Absolute: 0.1 10*3/uL (ref 0.0–0.2)
Basos: 1 %
EOS (ABSOLUTE): 0.1 10*3/uL (ref 0.0–0.4)
Eos: 2 %
Hematocrit: 49.3 % (ref 37.5–51.0)
Hemoglobin: 16.6 g/dL (ref 13.0–17.7)
Immature Grans (Abs): 0 10*3/uL (ref 0.0–0.1)
Immature Granulocytes: 0 %
Lymphocytes Absolute: 2 10*3/uL (ref 0.7–3.1)
Lymphs: 24 %
MCH: 30.5 pg (ref 26.6–33.0)
MCHC: 33.7 g/dL (ref 31.5–35.7)
MCV: 91 fL (ref 79–97)
Monocytes Absolute: 0.7 10*3/uL (ref 0.1–0.9)
Monocytes: 9 %
Neutrophils Absolute: 5.4 10*3/uL (ref 1.4–7.0)
Neutrophils: 64 %
Platelets: 180 10*3/uL (ref 150–450)
RBC: 5.45 x10E6/uL (ref 4.14–5.80)
RDW: 13.7 % (ref 11.6–15.4)
WBC: 8.3 10*3/uL (ref 3.4–10.8)

## 2024-01-15 LAB — LIPID PANEL W/O CHOL/HDL RATIO
Cholesterol, Total: 103 mg/dL (ref 100–199)
HDL: 32 mg/dL — ABNORMAL LOW (ref >39)
LDL Chol Calc (NIH): 56 mg/dL (ref 0–99)
Triglycerides: 70 mg/dL (ref 0–149)
VLDL Cholesterol Cal: 15 mg/dL (ref 5–40)

## 2024-01-15 LAB — TSH: TSH: 1.61 u[IU]/mL (ref 0.450–4.500)

## 2024-01-15 LAB — VITAMIN B12: Vitamin B-12: 790 pg/mL (ref 232–1245)

## 2024-01-15 NOTE — Progress Notes (Signed)
 Good morning, please let Brendan [name] know his labs have returned and overall they remain stable with no medication changes needed.  Kidney function continues to show Stage 3a kidney disease with no worsening.  Continue to focus healthy diet choices and good water intake daily.  Any questions? Keep being awesome!!  Thank you for allowing me to participate in your care.  I appreciate you. Kindest regards, [name]

## 2024-01-16 ENCOUNTER — Telehealth: Payer: Self-pay

## 2024-01-16 NOTE — Telephone Encounter (Signed)
Called x1.  Pt voicemail not set up.  Since colonoscopy is due in April referral will be closed.  A letter will be mailed for him to contact the office in March to schedule colonoscopy.  Created a personal reminder to call patient as well.  Thanks,  [place], [place]

## 2024-01-21 ENCOUNTER — Other Ambulatory Visit: Payer: Self-pay | Admitting: Podiatry

## 2024-01-29 ENCOUNTER — Encounter: Payer: Self-pay | Admitting: Podiatry

## 2024-01-29 ENCOUNTER — Ambulatory Visit (INDEPENDENT_AMBULATORY_CARE_PROVIDER_SITE_OTHER): Payer: Medicare HMO | Admitting: Podiatry

## 2024-01-29 DIAGNOSIS — I739 Peripheral vascular disease, unspecified: Secondary | ICD-10-CM

## 2024-01-29 DIAGNOSIS — M2041 Other hammer toe(s) (acquired), right foot: Secondary | ICD-10-CM | POA: Diagnosis not present

## 2024-01-29 DIAGNOSIS — M2042 Other hammer toe(s) (acquired), left foot: Secondary | ICD-10-CM

## 2024-01-29 DIAGNOSIS — E1142 Type 2 diabetes mellitus with diabetic polyneuropathy: Secondary | ICD-10-CM | POA: Diagnosis not present

## 2024-01-29 NOTE — Progress Notes (Signed)
He presents today for diabetic checkup.  States that he is got his diabetic shoes about 3 months ago.  His last hemoglobin A1c was at 6.1% still has trouble with the hammertoes and an uncomfortable sensation in the right midfoot to forefoot.  Otherwise he states he has no problems.  Objective: Vital signs are stable he is alert and oriented x 3 pulses are palpable.  Neurologic sensorium is intact per Semmes Weinstein monofilament deep pain sensation light touch proprioceptive sensation and deep tendon reflexes are all normal.  Muscle strength is normal and symmetrical bilateral.  He has mild cavus foot deformity with hammertoe deformities he does have osteoarthritic change at the tarsometatarsal joints with dorsal spurring and soreness in that forefoot from the TMT's distally.  No open lesions or wounds though he has thickening of the toenails probably a nail dystrophy.  Assessment: Diabetes mellitus without complications due to diabetes and other than some mild diabetic polyneuropathy.  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  And hammertoe deformity.  Plan: I recommended an injection across the dorsal aspect of the right foot however he would like to try something a little more simple than a painful injection he wants to try Voltaren gel which I did suggest 2-4 times a day if he could do that and I did also demonstrated to him how to lace his shoes in a manner that would not put pressure on that midfoot.  He understands this and is amenable to it I will follow-up with him on 6 months basis for his diabetes should he have questions or concerns or worsening of his situation he will notify us.

## 2024-02-24 ENCOUNTER — Other Ambulatory Visit: Payer: Self-pay | Admitting: Nurse Practitioner

## 2024-02-25 NOTE — Telephone Encounter (Signed)
 Requested Prescriptions  Pending Prescriptions Disp Refills   tamsulosin (FLOMAX) 0.4 MG CAPS capsule [Pharmacy Med Name: TAMSULOSIN HCL 0.4 MG CAPSULE] 90 capsule 0    Sig: Take 1 capsule (0.4 mg total) by mouth daily.     Urology: Alpha-Adrenergic Blocker Passed - 02/25/2024  2:59 PM      Passed - PSA in normal range and within 360 days    PSA  Date Value Ref Range Status  05/17/2015 from [facility]  Final   Prostate Specific Ag, Serum  Date Value Ref Range Status  04/15/2023 0.4 0.0 - 4.0 ng/mL Final    Comment:    Roche ECLIA methodology. According to the American Urological Association, Serum PSA should decrease and remain at undetectable levels after radical prostatectomy. The AUA defines biochemical recurrence as an initial PSA value 0.2 ng/mL or greater followed by a subsequent confirmatory PSA value 0.2 ng/mL or greater. Values obtained with different assay methods or kits cannot be used interchangeably. Results cannot be interpreted as absolute evidence of the presence or absence of malignant disease.          Passed - Last BP in normal range    BP Readings from Last 1 Encounters:  01/14/24 137/68         Passed - Valid encounter within last 12 months    Recent Outpatient Visits           1 month ago Controlled type 2 diabetes with neuropathy (HCC)   [facility] [place], [place] T, NP   4 months ago Controlled type 2 diabetes with neuropathy (HCC)   [place] [facility] [place], Dorie Rank, NP   5 months ago Neck pain   [place] [facility] [place], [place] T, NP   6 months ago Controlled type 2 diabetes with neuropathy (HCC)   [place] [facility] [place], Corrie Dandy T, NP   7 months ago Controlled type 2 diabetes with neuropathy (HCC)   [place] [facility] [place], Dorie Rank, NP       Future Appointments             In 1 month [name], Dorie Rank, NP [place] [facility]

## 2024-03-02 ENCOUNTER — Other Ambulatory Visit: Payer: Self-pay | Admitting: Nurse Practitioner

## 2024-03-02 NOTE — Telephone Encounter (Signed)
 Unable to contact patient.  Voicemail has not been activated.  Letter will be mailed.  Thanks,  [place], [place]

## 2024-03-03 NOTE — Telephone Encounter (Signed)
 Requested Prescriptions  Pending Prescriptions Disp Refills   metFORMIN (GLUCOPHAGE) 1000 MG tablet [Pharmacy Med Name: METFORMIN HCL 1,000 MG TABLET] 180 tablet 0    Sig: Take 1 tablet (1,000 mg total) by mouth 2 (two) times daily with a meal.     Endocrinology:  Diabetes - Biguanides Failed - 03/03/2024  5:14 PM      Failed - Cr in normal range and within 360 days    Creatinine, Ser  Date Value Ref Range Status  01/14/2024 1.42 (H) 0.76 - 1.27 mg/dL Final         Failed - eGFR in normal range and within 360 days    GFR calc Af Amer  Date Value Ref Range Status  08/23/2020 71 >59 mL/min/1.73 Final    Comment:    **[facility] currently reports eGFR in compliance with the current**   recommendations of the [employer]. [facility] will   update reporting as new guidelines are published from the NKF-ASN   Task force.    GFR, Estimated  Date Value Ref Range Status  05/28/2023 >60 >60 mL/min Final    Comment:    (NOTE) Calculated using the CKD-EPI Creatinine Equation (2021)    eGFR  Date Value Ref Range Status  01/14/2024 53 (L) >59 mL/min/1.73 Final         Passed - HBA1C is between 0 and 7.9 and within 180 days    Hemoglobin A1C  Date Value Ref Range Status  09/17/2016 5.9%  Final   HB A1C (BAYER DCA - WAIVED)  Date Value Ref Range Status  01/14/2024 6.1 (H) 4.8 - 5.6 % Final    Comment:             Prediabetes: 5.7 - 6.4          Diabetes: >6.4          Glycemic control for adults with diabetes: <7.0          Passed - B12 Level in normal range and within 720 days    Vitamin B-12  Date Value Ref Range Status  01/14/2024 790 232 - 1,245 pg/mL Final         Passed - Valid encounter within last 6 months    Recent Outpatient Visits           1 month ago Controlled type 2 diabetes with neuropathy (HCC)   [facility] [place], [place] T, NP   4 months ago Controlled type 2 diabetes with neuropathy (HCC)   [place] [facility] [place], Dorie Rank, NP   6 months ago Neck pain   [place] [facility] [place], [place] T, NP   6 months ago Controlled type 2 diabetes with neuropathy (HCC)   [facility] [place], [name], NP   7 months ago Controlled type 2 diabetes with neuropathy (HCC)   [place] [facility] [place], Dorie Rank, NP       Future Appointments             In 1 month [place], Cement T, NP [facility] [facility]            Passed - CBC within normal limits and completed in the last 12 months    WBC  Date Value Ref Range Status  01/14/2024 8.3 3.4 - 10.8 x10E3/uL Final  05/28/2023 7.3 4.0 - 10.5 K/uL Final   RBC  Date Value Ref Range Status  01/14/2024 5.45 4.14 - 5.80 x10E6/uL Final  05/28/2023 5.65 4.22 - 5.81 MIL/uL Final   Hemoglobin  Date Value Ref Range Status  01/14/2024 16.6 13.0 - 17.7 g/dL Final   Total hemoglobin  Date Value Ref Range Status  07/02/2017 9.7 (L) 12.0 - 16.0 g/dL Final   Hematocrit  Date Value Ref Range Status  01/14/2024 49.3 37.5 - 51.0 % Final   MCHC  Date Value Ref Range Status  01/14/2024 33.7 31.5 - 35.7 g/dL Final  62/13/0865 78.4 30.0 - 36.0 g/dL Final   [facility]  Date Value Ref Range Status  01/14/2024 30.5 26.6 - 33.0 pg Final  05/28/2023 29.9 26.0 - 34.0 pg Final   MCV  Date Value Ref Range Status  01/14/2024 91 79 - 97 fL Final   No results found for: "PLTCOUNTKUC", "LABPLAT", "POCPLA" RDW  Date Value Ref Range Status  01/14/2024 13.7 11.6 - 15.4 % Final

## 2024-03-05 ENCOUNTER — Encounter: Payer: Self-pay | Admitting: *Deleted

## 2024-03-09 ENCOUNTER — Other Ambulatory Visit: Payer: Self-pay | Admitting: Nurse Practitioner

## 2024-03-09 ENCOUNTER — Telehealth: Payer: Self-pay

## 2024-03-09 ENCOUNTER — Telehealth: Payer: Self-pay | Admitting: *Deleted

## 2024-03-09 ENCOUNTER — Other Ambulatory Visit: Payer: Self-pay | Admitting: *Deleted

## 2024-03-09 DIAGNOSIS — Z1211 Encounter for screening for malignant neoplasm of colon: Secondary | ICD-10-CM

## 2024-03-09 MED ORDER — NA SULFATE-K SULFATE-MG SULF 17.5-3.13-1.6 GM/177ML PO SOLN
1.0000 | Freq: Once | ORAL | 0 refills | Status: AC
Start: 2024-03-09 — End: 2024-03-09

## 2024-03-09 NOTE — Telephone Encounter (Signed)
 Colonoscopy schedule with Dr Servando Snare on 04/21/2024

## 2024-03-09 NOTE — Telephone Encounter (Signed)
 The patient called in to schedule an colonoscopy.

## 2024-03-09 NOTE — Telephone Encounter (Signed)
 Gastroenterology Pre-Procedure Review  Request Date: 04/21/2024 Requesting Physician: Dr. Servando Snare  PATIENT REVIEW QUESTIONS: The patient responded to the following health history questions as indicated:    1. Are you having any GI issues? no 2. Do you have a personal history of Polyps? no 3. Do you have a family history of Colon Cancer or Polyps? no 4. Diabetes Mellitus? yes (taking metformin, Farxiga) 5. Joint replacements in the past 12 months?no 6. Major health problems in the past 3 months?no 7. Any artificial heart valves, MVP, or defibrillator? Chronic heart failure    MEDICATIONS & ALLERGIES:    Patient reports the following regarding taking any anticoagulation/antiplatelet therapy:   Plavix, Coumadin, Eliquis, Xarelto, Lovenox, Pradaxa, Brilinta, or Effient? no Aspirin? yes (81 mg)  Patient confirms/reports the following medications:  Current Outpatient Medications  Medication Sig Dispense Refill   Na Sulfate-K Sulfate-Mg Sulfate concentrate (SUPREP) 17.5-3.13-1.6 GM/177ML SOLN Take 1 kit (354 mLs total) by mouth once for 1 dose. 354 mL 0   acetaminophen (TYLENOL) 500 MG tablet Take 1 tablet (500 mg total) by mouth every 6 (six) hours as needed for mild pain or headache. 30 tablet 0   albuterol (VENTOLIN HFA) 108 (90 Base) MCG/ACT inhaler Inhale 2 puffs into the lungs every 6 (six) hours as needed for wheezing or shortness of breath. 18 g 4   amLODipine (NORVASC) 5 MG tablet Take 1 tablet (5 mg total) by mouth daily. 90 tablet 4   aspirin EC 81 MG tablet Take 1 tablet (81 mg total) by mouth daily.     atorvastatin (LIPITOR) 20 MG tablet Take 1 tablet (20 mg total) by mouth daily. 90 tablet 2   azelastine (ASTELIN) 0.1 % nasal spray Place 2 sprays into both nostrils at bedtime.     chlorthalidone (HYGROTON) 25 MG tablet Take 1 tablet (25 mg total) by mouth daily. 90 tablet 4   cyanocobalamin 1000 MCG tablet Take 1,000 mcg by mouth every other day.     dapagliflozin propanediol  (FARXIGA) 10 MG TABS tablet Take 1 tablet (10 mg total) by mouth daily before breakfast. 90 tablet 4   gabapentin (NEURONTIN) 300 MG capsule Take one capsule at lunchtime and two capsules at bedtime 270 capsule 0   lidocaine (LIDODERM) 5 % Place 1 patch onto the skin daily. Remove & Discard patch within 12 hours or as directed by MD 30 patch 0   loratadine (CLARITIN) 10 MG tablet Take 1 tablet (10 mg total) by mouth daily. 30 tablet 2   metFORMIN (GLUCOPHAGE) 1000 MG tablet Take 1 tablet (1,000 mg total) by mouth 2 (two) times daily with a meal. 180 tablet 0   metoprolol tartrate (LOPRESSOR) 25 MG tablet Take 0.5 tablets (12.5 mg total) by mouth 2 (two) times daily. 180 tablet 0   montelukast (SINGULAIR) 10 MG tablet Take 1 tablet (10 mg total) by mouth at bedtime. 90 tablet 4   nitroGLYCERIN (NITROSTAT) 0.4 MG SL tablet Place 1 tablet (0.4 mg total) under the tongue every 5 (five) minutes as needed for chest pain. 25 tablet 3   potassium chloride SA (KLOR-CON M) 20 MEQ tablet Take 2 tablets (40 meq) by mouth once daily 180 tablet 2   sildenafil (REVATIO) 20 MG tablet TAKE 1 TO 5 TABLETS AS NEEDED. 30 tablet 0   sitaGLIPtin (JANUVIA) 100 MG tablet Take 1 tablet (100 mg total) by mouth daily. 90 tablet 4   tamsulosin (FLOMAX) 0.4 MG CAPS capsule Take 1 capsule (0.4 mg total) by  mouth daily. 90 capsule 0   tiZANidine (ZANAFLEX) 4 MG tablet Take 1 tablet (4 mg total) by mouth every 6 (six) hours as needed for muscle spasms. 45 tablet 1   No current facility-administered medications for this visit.    Patient confirms/reports the following allergies:  Allergies  Allergen Reactions   Lisinopril Other (See Comments)    Fatigue and ED    No orders of the defined types were placed in this encounter.   AUTHORIZATION INFORMATION Primary Insurance: 1D#: Group #:  Secondary Insurance: 1D#: Group #:  SCHEDULE INFORMATION: Date: 04/21/2024 Time: Location: [facility]

## 2024-03-10 ENCOUNTER — Telehealth: Payer: Self-pay | Admitting: Internal Medicine

## 2024-03-10 NOTE — Telephone Encounter (Signed)
 Requested Prescriptions  Pending Prescriptions Disp Refills   chlorthalidone (HYGROTON) 25 MG tablet [Pharmacy Med Name: CHLORTHALIDONE 25 MG TABLET] 90 tablet 0    Sig: Take 1 tablet (25 mg total) by mouth daily.     Cardiovascular: Diuretics - Thiazide Failed - 03/10/2024 10:11 AM      Failed - Cr in normal range and within 180 days    Creatinine, Ser  Date Value Ref Range Status  01/14/2024 1.42 (H) 0.76 - 1.27 mg/dL Final         Passed - K in normal range and within 180 days    Potassium  Date Value Ref Range Status  01/14/2024 3.9 3.5 - 5.2 mmol/L Final         Passed - Na in normal range and within 180 days    Sodium  Date Value Ref Range Status  01/14/2024 144 134 - 144 mmol/L Final         Passed - Last BP in normal range    BP Readings from Last 1 Encounters:  01/14/24 137/68         Passed - Valid encounter within last 6 months    Recent Outpatient Visits           1 month ago Controlled type 2 diabetes with neuropathy (HCC)   [facility] [place], Corrie Dandy T, NP   4 months ago Controlled type 2 diabetes with neuropathy (HCC)   [place] [facility] [place], Dorie Rank, NP   6 months ago Neck pain   [place] [facility] [place], [place] T, NP   6 months ago Controlled type 2 diabetes with neuropathy (HCC)   [place] [facility] [place], [name], NP   7 months ago Controlled type 2 diabetes with neuropathy (HCC)   [place] [facility] [place], Dorie Rank, NP       Future Appointments             In 1 month [name], Dorie Rank, NP [facility]

## 2024-03-10 NOTE — Telephone Encounter (Signed)
   Pre-operative Risk Assessment    Patient Name: Brendan [name]  DOB: 10-Jul-1951 MRN: [id number]   Date of last office visit: 08/19/2023 Date of next office visit: n/a   Request for Surgical Clearance    Procedure:   colonoscopy  Date of Surgery:  Clearance 04/21/24                                Surgeon:  n/a Surgeon's Group or Practice Name:  [facility] Phone number:  [phone] Fax number:  [phone]   Type of Clearance Requested:   - Medical    Type of Anesthesia:  General    Additional requests/questions:    SignedMaceo Pro Schools   03/10/2024, 11:03 AM

## 2024-03-11 ENCOUNTER — Encounter: Payer: Self-pay | Admitting: Podiatry

## 2024-03-11 ENCOUNTER — Ambulatory Visit (INDEPENDENT_AMBULATORY_CARE_PROVIDER_SITE_OTHER): Payer: Medicare HMO | Admitting: Podiatry

## 2024-03-11 DIAGNOSIS — I739 Peripheral vascular disease, unspecified: Secondary | ICD-10-CM

## 2024-03-11 DIAGNOSIS — E1142 Type 2 diabetes mellitus with diabetic polyneuropathy: Secondary | ICD-10-CM

## 2024-03-11 DIAGNOSIS — M79676 Pain in unspecified toe(s): Secondary | ICD-10-CM | POA: Diagnosis not present

## 2024-03-11 DIAGNOSIS — B351 Tinea unguium: Secondary | ICD-10-CM

## 2024-03-11 NOTE — Progress Notes (Signed)
 He presents today chief complaint of painfully elongated toenails.  Objective: Toenails are long thick yellow dystrophic like mycotic pulses are palpable.  Decreased sensorium to some Weinstein monofilament.  No open lesions or wounds.  Assessment: Pain in limb secondary to onychomycosis.  Diabetic peripheral neuropathy.  Plan: Debridement of toenails 1 through 5 bilateral.

## 2024-03-11 NOTE — Telephone Encounter (Signed)
   Name: [name]  DOB: November 05, 1951  MRN: [id number]  Primary Cardiologist: Yvonne Kendall, MD  Chart reviewed as part of pre-operative protocol coverage. Because of Brendan [name] past medical history and time since last visit, he will require a follow-up in-office visit in order to better assess preoperative cardiovascular risk.  Pre-op covering staff: - Please schedule appointment and call patient to inform them.  Patient was last seen in office on 08/19/2023 and at that time it was recommended for 21-month follow-up. - Please contact requesting surgeon's office via preferred method (i.e, phone, fax) to inform them of need for appointment prior to surgery.  Regarding ASA therapy, we recommend continuation of ASA throughout the perioperative period.  However, if the surgeon feels that cessation of ASA is required in the perioperative period, it may be stopped 5-7 days prior to surgery with a plan to resume it as soon as felt to be feasible from a surgical standpoint in the post-operative period.   Denyce Robert, NP  03/11/2024, 10:25 AM

## 2024-03-11 NOTE — Telephone Encounter (Signed)
 Tried to call the pt, though recording says vm not set up, could note leave message to call back. I will send a message to the requesting office if they s/w the pt to let him know he needs to call the office to schedule in office appt for preop clearance.

## 2024-03-16 NOTE — Telephone Encounter (Signed)
 2nd attempt: Vm not activated, cannot leave message to call back to schedule in office appt for preop clearance.

## 2024-03-19 NOTE — Telephone Encounter (Signed)
 3rd attempt to reach the pt to schedule in office appt for preop clearance.   I will fax notes to surgeon's office as FYI. Will remove from the preop call back pool.

## 2024-03-23 ENCOUNTER — Telehealth: Payer: Self-pay | Admitting: *Deleted

## 2024-03-23 ENCOUNTER — Encounter: Payer: Self-pay | Admitting: *Deleted

## 2024-03-23 NOTE — Telephone Encounter (Signed)
 Received message from Dr Serita Kyle office that they have been trying to contact patient but no success.  I have been trying calling patient on 03/19/2024 and 2 times on 03/23/2024.  Sending letter to patient.

## 2024-03-25 NOTE — Telephone Encounter (Signed)
 Patient called office back but left a voicemail with Marcelino Duster. Tried to call patient 2 times but VM is not set up.

## 2024-03-30 NOTE — Progress Notes (Unsigned)
 Cardiology Office Note    Date:  04/02/2024   ID:  Brendan [name], DOB [birth year], MRN [id number]  PCP:  Marjie Skiff, NP  Cardiologist:  Yvonne Kendall, MD  Electrophysiologist:  None   Chief Complaint: Follow-up  History of Present Illness:   Brendan [name] is a 73 y.o. 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   Brendan [name] underwent diagnostic cardiac catheterization 05/2017 for exertional angina which showed three-vessel CAD. This was followed by four-vessel CABG on 06/2017 with postoperative course complicated by pneumothorax requiring chest tube placement along with postoperative atrial fibrillation treated with a short course of amiodarone. There has been no documented evidence of recurrence of atrial fibrillation. Preoperative carotid artery ultrasound in 2018 showed 1 to 39% right ICA and 40 to 59% left ICA stenosis. Echo 03/2020 showed EF of 60 to 65%, mildly reduced RV systolic function, no valvular, no significant valvular abnormalities, and mildly dilated aortic root measuring 41 mm. Lexiscan MPI in 04/2020 showed no evidence of ischemia with EF 55 to 65%, and was low risk. History of orthostatic dizziness without improvement with escalation of diuretic therapy. Brendan [name] was evaluated in the ED 05/2023 with positional dizziness with associated chest tightness. Overall cardiac workup was reassuring. Blood pressure was elevated 150s to 160s systolic. These symptoms were ultimately thought to be due to an inner ear issue. Seen in follow-up 06/2023 with intermittent bilateral chest tightness that felt like a pulled muscle and longstanding positional  dizziness. Lexiscan MPI 06/2023 showed no evidence of ischemia with EF of 66% and was overall low risk. Carotid artery ultrasound 08/2023 showed 1 to 39% bilateral ICA stenosis. Echo 08/2023 showed EF 50 to 55%, G2 DD, mildly enlarged right ventricular cavity size, mildly dilated right atrium, no significant valvular abnormalities. Brendan [name] was seen again in follow-up 08/2023 and at that time was doing well from a cardiac perspective. No further testing or medication changes were indicated at that time.  Today, patient reports doing well from a cardiac standpoint without symptoms of angina or cardiac decompensation. Brendan [name] has not needed to take sublingual nitroglycerin. Brendan [name] endorses some shortness of breath with heavier exertion, which Brendan [name] tells me is baseline for him. 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 Brendan [name] is able to climb a flight of stairs without problem. Brendan [name] takes his blood pressure at home and reports readings of 120s/70s. Brendan [name] denies chest pain, palpitations, lightheadedness, dizziness, syncope, bleeding, orthopnea, and PND.   Labs independently reviewed: 01/2024 - TSH normal, TC 103, TG 70, HDL 32, LDL 56, BUN 29, serum creatinine 1.42, potassium 3.9, albumin 4.7, AST/ALT normal, Hgb 16.6, PLT 180, A1c 6.1  Past Medical History:  Diagnosis Date   CAD (coronary artery disease)    a. 05/2016 Lexi MV: EF 58%, no ischemia/infarct; b. 05/2017 Cath: LM 20-30, LAD 50p, 54m, D1 50p, D2 80ost, LCX 90/70ost, RCA 80/90p/m, RPL1 50p, EF 50-55%; c. 06/2017 CABG x 4 (LIMA->LAD, VG->D2,VG->OM, VG->PDA).   Diabetes mellitus without complication (HCC)    Diastolic dysfunction    a. 05/2016 Echo: EF 55%, Gr1 DD, mild TR/MR.   Hyperlipidemia    Hypertension    Hypokalemia    Personal history  of tobacco use, presenting hazards to health 05/03/2016   Post-op Afib    a. 06/2017 following CABG->short course of amio.    Past Surgical History:  Procedure Laterality Date   BACK SURGERY     CARDIAC CATHETERIZATION      CORONARY ARTERY BYPASS GRAFT N/A 07/01/2017   Procedure: CORONARY ARTERY BYPASS GRAFTING (CABG) x4, using left mammary artery and right greater saphenous vein harvested endoscopically;  Surgeon: Kerin Perna, MD;  Location: [facility] OR;  Service: Open Heart Surgery;  Laterality: N/A;   KNEE SURGERY     LEFT HEART CATH AND CORONARY ANGIOGRAPHY N/A 06/04/2017   Procedure: Left Heart Cath and Coronary Angiography;  Surgeon: Yvonne Kendall, MD;  Location: [facility] INVASIVE CV LAB;  Service: Cardiovascular;  Laterality: N/A;   TEE WITHOUT CARDIOVERSION N/A 07/01/2017   Procedure: TRANSESOPHAGEAL ECHOCARDIOGRAM (TEE);  Surgeon: Donata Clay, Theron Arista, MD;  Location: [facility] OR;  Service: Open Heart Surgery;  Laterality: N/A;    Current Medications: Current Meds  Medication Sig   acetaminophen (TYLENOL) 500 MG tablet Take 1 tablet (500 mg total) by mouth every 6 (six) hours as needed for mild pain or headache.   albuterol (VENTOLIN HFA) 108 (90 Base) MCG/ACT inhaler Inhale 2 puffs into the lungs every 6 (six) hours as needed for wheezing or shortness of breath.   amLODipine (NORVASC) 5 MG tablet Take 1 tablet (5 mg total) by mouth daily.   aspirin EC 81 MG tablet Take 1 tablet (81 mg total) by mouth daily.   atorvastatin (LIPITOR) 20 MG tablet Take 1 tablet (20 mg total) by mouth daily.   azelastine (ASTELIN) 0.1 % nasal spray Place 2 sprays into both nostrils at bedtime.   chlorthalidone (HYGROTON) 25 MG tablet Take 1 tablet (25 mg total) by mouth daily.   cyanocobalamin 1000 MCG tablet Take 1,000 mcg by mouth every other day.   dapagliflozin propanediol (FARXIGA) 10 MG TABS tablet Take 1 tablet (10 mg total) by mouth daily before breakfast.   gabapentin (NEURONTIN) 300 MG capsule Take one capsule at lunchtime and two capsules at bedtime   lidocaine (LIDODERM) 5 % Place 1 patch onto the skin daily. Remove & Discard patch within 12 hours or as directed by MD   loratadine (CLARITIN) 10 MG tablet Take 1 tablet (10 mg  total) by mouth daily.   metFORMIN (GLUCOPHAGE) 1000 MG tablet Take 1 tablet (1,000 mg total) by mouth 2 (two) times daily with a meal.   metoprolol tartrate (LOPRESSOR) 25 MG tablet Take 0.5 tablets (12.5 mg total) by mouth 2 (two) times daily.   montelukast (SINGULAIR) 10 MG tablet Take 1 tablet (10 mg total) by mouth at bedtime.   nitroGLYCERIN (NITROSTAT) 0.4 MG SL tablet Place 1 tablet (0.4 mg total) under the tongue every 5 (five) minutes as needed for chest pain.   potassium chloride SA (KLOR-CON M) 20 MEQ tablet Take 2 tablets (40 meq) by mouth once daily   sildenafil (REVATIO) 20 MG tablet TAKE 1 TO 5 TABLETS AS NEEDED.   sitaGLIPtin (JANUVIA) 100 MG tablet Take 1 tablet (100 mg total) by mouth daily.   tamsulosin (FLOMAX) 0.4 MG CAPS capsule Take 1 capsule (0.4 mg total) by mouth daily.   tiZANidine (ZANAFLEX) 4 MG tablet Take 1 tablet (4 mg total) by mouth every 6 (six) hours as needed for muscle spasms.    Allergies:   Lisinopril   Social History   Socioeconomic History   Marital status: Widowed    Spouse  name: Not on file   Number of children: 3   Years of education: Not on file   Highest education level: 10th grade  Occupational History   Occupation: works part time as Copy   Tobacco Use   Smoking status: Former    Current packs/day: 0.00    Average packs/day: 1 pack/day for 60.0 years (60.0 ttl pk-yrs)    Types: Cigarettes    Start date: 06/29/1957    Quit date: 06/29/2017    Years since quitting: 6.7   Smokeless tobacco: Never  Vaping Use   Vaping status: Never Used  Substance and Sexual Activity   Alcohol use: No    Comment: previously drank heavily but quit ~ 21 yrs ago.   Drug use: No   Sexual activity: Yes  Other Topics Concern   Not on file  Social History Narrative   Lives in [place] by himself.  Works as Copy 5 hours per day, 5 days per week. Does not routinely exercise.   Social Drivers of Corporate investment banker Strain: Low Risk   (11/26/2023)   Overall Financial Resource Strain (CARDIA)    Difficulty of Paying Living Expenses: Not hard at all  Food Insecurity: No Food Insecurity (11/26/2023)   Hunger Vital Sign    Worried About Running Out of Food in the Last Year: Never true    Ran Out of Food in the Last Year: Never true  Transportation Needs: No Transportation Needs (11/26/2023)   PRAPARE - Administrator, Civil Service (Medical): No    Lack of Transportation (Non-Medical): No  Physical Activity: Sufficiently Active (11/26/2023)   Exercise Vital Sign    Days of Exercise per Week: 5 days    Minutes of Exercise per Session: 30 min  Stress: No Stress Concern Present (11/26/2023)   [employer] of Occupational Health - Occupational Stress Questionnaire    Feeling of Stress : Not at all  Social Connections: Socially Isolated (11/26/2023)   Social Connection and Isolation Panel [NHANES]    Frequency of Communication with Friends and Family: Three times a week    Frequency of Social Gatherings with Friends and Family: More than three times a week    Attends Religious Services: Never    Database administrator or Organizations: No    Attends Banker Meetings: Never    Marital Status: Widowed     Family History:  The patient's family history includes Diabetes in his mother; Heart disease (age of onset: 2) in his father; Hypertension in his mother.  ROS:   12-point review of systems is negative unless otherwise noted in the HPI.   EKGs/Labs/Other Studies Reviewed:    Studies reviewed were summarized above. The additional studies were reviewed today:  LHC 05/2017: Conclusions: Severe, three vessel coronary artery disease with diffuse calcification, as detailed below. Basal and mid inferior hypokinesis with otherwise preserved left ventricular contraction (LVEF 50-55%). Normal left ventricular filling pressure.   Recommendations: Outpatient cardiac surgery evaluation given  severe three-vessel, diabetes mellitus, and continued exertional angina despite aggressive medical therapy. Continue secondary prevention. __________   TEE 06/2017:  Left ventricle: Normal cavity size, wall thickness, left ventricular  diastolic function and left atrial pressure. LV systolic function is low  normal with an EF of 50-55%. No thrombus present. No mass present.   Mitral valve: Trace regurgitation.   Right ventricle: Normal cavity size, wall thickness and ejection  fraction.   Tricuspid valve: Trace regurgitation. __________  2D echo was 04/12/2020: 1. Left ventricular ejection fraction, by estimation, is 60 to 65%. The  left ventricle has normal function. Left ventricular endocardial border  not optimally defined to evaluate regional wall motion. Left ventricular  diastolic parameters were normal.   2. Right ventricular systolic function is mildly reduced. The right  ventricular size is normal.   3. The mitral valve is grossly normal. No evidence of mitral valve  regurgitation. No evidence of mitral stenosis.   4. The aortic valve was not well visualized. Aortic valve regurgitation  is not visualized. No aortic stenosis is present.   5. Pulmonic valve regurgitation not well assessed.   6. Aortic dilatation noted. There is mild dilatation of the aortic root  measuring 41 mm.  __________   Eugenie Birks MPI 05/02/2020: T wave inversion was noted during stress in the V3, V4 and V5 leads. There was no ST segment deviation noted during stress. The study is normal. This is a low risk study. The left ventricular ejection fraction is normal (55-65%). __________   Eugenie Birks MPI 06/21/2023: Pharmacological myocardial perfusion imaging study with no significant  ischemia Normal wall motion, EF estimated at 66% No EKG changes concerning for ischemia at peak stress or in recovery. CT attenuation correction images with mild aortic atherosclerosis, severe three-vessel coronary  calcification Low risk scan __________   Carotid artery ultrasound 08/07/2023: Summary:  Right Carotid: Velocities in the right ICA are consistent with a 1-39% stenosis.   Left Carotid: Velocities in the left ICA are consistent with a 1-39% stenosis.   Vertebrals: Bilateral vertebral arteries demonstrate antegrade flow.  Subclavians: Normal flow hemodynamics were seen in bilateral subclavian arteries.  __________   2D echo 08/07/2023: 1. Left ventricular ejection fraction, by estimation, is 50 to 55%. The  left ventricle has low normal function. Left ventricular endocardial  border not optimally defined to evaluate regional wall motion. There is  mild left ventricular hypertrophy. Left  ventricular diastolic parameters are consistent with Grade II diastolic  dysfunction (pseudonormalization).   2. Right ventricular systolic function is normal. The right ventricular  size is mildly enlarged.   3. Right atrial size was mildly dilated.   4. The mitral valve is grossly normal. No evidence of mitral valve  regurgitation.   5. The aortic valve was not well visualized. Aortic valve regurgitation  is not visualized. No aortic stenosis is present.   6. Pulmonic valve regurgitation not well-assessed.    EKG:  EKG is ordered today.  The EKG ordered today demonstrates sinus arrhythmia, rate 73 bpm  Recent Labs: 01/14/2024: ALT 15; BUN 29; Creatinine, Ser 1.42; Hemoglobin 16.6; Platelets 180; Potassium 3.9; Sodium 144; TSH 1.610  Recent Lipid Panel    Component Value Date/Time   CHOL 103 01/14/2024 1434   CHOL 111 02/29/2016 1558   TRIG 70 01/14/2024 1434   TRIG 73 02/29/2016 1558   HDL 32 (L) 01/14/2024 1434   CHOLHDL 3.5 08/21/2018 1453   VLDL 15 02/29/2016 1558   LDLCALC 56 01/14/2024 1434    PHYSICAL EXAM:    VS:  BP 124/68 (BP Location: Left Arm, Patient Position: Sitting, Cuff Size: Normal)   Pulse (!) 54   Resp 17   Ht 6\' 3"  (1.905 m)   Wt 197 lb (89.4 kg)   SpO2 92%    BMI 24.62 kg/m   BMI: Body mass index is 24.62 kg/m.  Physical Exam Constitutional:      General: Brendan [name] is not in acute distress.  Appearance: Normal appearance.  Neck:     Vascular: No carotid bruit.  Cardiovascular:     Rate and Rhythm: Normal rate and regular rhythm.     Pulses: Normal pulses.     Heart sounds: Normal heart sounds. No murmur heard. Pulmonary:     Effort: Pulmonary effort is normal.     Breath sounds: Normal breath sounds. No wheezing or rales.  Musculoskeletal:        General: No deformity.     Right lower leg: No edema.     Left lower leg: No edema.  Skin:    General: Skin is warm and dry.  Neurological:     General: No focal deficit present.     Mental Status: Brendan [name] is alert and oriented to person, place, and time. Mental status is at baseline.  Psychiatric:        Mood and Affect: Mood normal.        Behavior: Behavior normal.    Wt Readings from Last 3 Encounters:  04/02/24 197 lb (89.4 kg)  01/14/24 194 lb 12.8 oz (88.4 kg)  11/26/23 196 lb 12.8 oz (89.3 kg)     ASSESSMENT & PLAN:   Preoperative cardiac risk stratification - Patient is scheduled for colonoscopy 04/21/2024 to screen for colon cancer. According to the RCRI, patient has a 10.1% risk of MACE. Patient reports activity equivalent to 4.0 METS (mopping, climbing stairs). Based on ACC/AHA guidelines, Brendan [name] would be at acceptable risk for the planned procedure without further cardiovascular testing. Regarding aspirin therapy, we recommend continuation of aspirin throughout the perioperative period.   CAD s/p CABG without angina - Patient is without symptoms of angina and cardiac decompensation. Continue risk factor modification and secondary prevention with aspirin, amlodipine, atorvastatin, and sublingual nitroglycerin as needed.   Hypertension - BP well controlled per office and reported at home readings. Continue amlodipine, chlorthalidone, and metoprolol tartrate.   HFpEF - Appears  euvolemic and well compensated on exam. Continue antihypertensives as above. Continue Farxiga. Not requiring a standing loop diuretic.   Carotid artery stenosis - Stable to improved on most recent US 08/2023.  Hyperlipidemia - Most recent lipid panel with LDL 56. Continue atorvastatin.   Positional dizziness - No symptoms since last visit. No further testing indicated.     Disposition: F/u with Dr. Okey Dupre or an APP in 6 months.   Medication Adjustments/Labs and Tests Ordered: Current medicines are reviewed at length with the patient today.  Concerns regarding medicines are outlined above. Medication changes, Labs and Tests ordered today are summarized above and listed in the Patient Instructions accessible in Encounters.   Velora Mediate, PA-C 04/02/2024 4:35 PM     [facility] [place] [address] [place], [place] [address] [phone]

## 2024-04-02 ENCOUNTER — Encounter: Payer: Self-pay | Admitting: Physician Assistant

## 2024-04-02 ENCOUNTER — Ambulatory Visit: Attending: Physician Assistant | Admitting: Physician Assistant

## 2024-04-02 VITALS — BP 124/68 | HR 54 | Resp 17 | Ht 75.0 in | Wt 197.0 lb

## 2024-04-02 DIAGNOSIS — E1169 Type 2 diabetes mellitus with other specified complication: Secondary | ICD-10-CM

## 2024-04-02 DIAGNOSIS — Z01810 Encounter for preprocedural cardiovascular examination: Secondary | ICD-10-CM

## 2024-04-02 DIAGNOSIS — I5032 Chronic diastolic (congestive) heart failure: Secondary | ICD-10-CM | POA: Diagnosis not present

## 2024-04-02 DIAGNOSIS — I6523 Occlusion and stenosis of bilateral carotid arteries: Secondary | ICD-10-CM

## 2024-04-02 DIAGNOSIS — E785 Hyperlipidemia, unspecified: Secondary | ICD-10-CM

## 2024-04-02 DIAGNOSIS — I2510 Atherosclerotic heart disease of native coronary artery without angina pectoris: Secondary | ICD-10-CM

## 2024-04-02 DIAGNOSIS — R42 Dizziness and giddiness: Secondary | ICD-10-CM

## 2024-04-02 DIAGNOSIS — I10 Essential (primary) hypertension: Secondary | ICD-10-CM

## 2024-04-02 NOTE — Patient Instructions (Signed)
 Medication Instructions:   Your Physician recommend you continue on your current medication as directed.     *If you need a refill on your cardiac medications before your next appointment, please call your pharmacy*  Lab Work:  No labs needed today.  Testing/Procedures:  No tests needed today.  Follow-Up: At [facility], you and your health needs are our priority.  As part of our continuing mission to provide you with exceptional heart care, our providers are all part of one team.  This team includes your primary Cardiologist (physician) and Advanced Practice Providers or APPs (Physician Assistants and Nurse Practitioners) who all work together to provide you with the care you need, when you need it.  Your next appointment:   6 month(s)  Provider:   You may see Yvonne Kendall, MD or one of the following Advanced Practice Providers on your designated Care Team:   Nicolasa Ducking, NP Ames Dura, PA-C Eula Listen, PA-C Cadence [place], PA-C Charlsie Quest, NP Carlos Levering, NP    We recommend signing up for the patient portal called "MyChart".  Sign up information is provided on this After Visit Summary.  MyChart is used to connect with patients for Virtual Visits (Telemedicine).  Patients are able to view lab/test results, encounter notes, upcoming appointments, etc.  Non-urgent messages can be sent to your provider as well.   To learn more about what you can do with MyChart, go to [email or web address].

## 2024-04-09 ENCOUNTER — Telehealth: Payer: Self-pay | Admitting: *Deleted

## 2024-04-09 NOTE — Telephone Encounter (Signed)
 Per Cardiology on 04/02/2024  Cardiology Office Note     Date:  04/02/2024    ID:  Aria Pickrell, DOB [birth year], MRN [id number]   PCP:  Marjie Skiff, NP             Cardiologist:  Yvonne Kendall, MD  Electrophysiologist:  None   Eros Montour is a 73 y.o. 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   ASSESSMENT & PLAN:    Preoperative cardiac risk stratification - Patient is scheduled for colonoscopy 04/21/2024 to screen for colon cancer. According to the RCRI, patient has a 10.1% risk of MACE. Patient reports activity equivalent to 4.0 METS (mopping, climbing stairs). Based on ACC/AHA guidelines, Mr. Rosenow would be at acceptable risk for the planned procedure without further cardiovascular testing. Regarding aspirin therapy, we recommend continuation of aspirin throughout the perioperative period.

## 2024-04-11 NOTE — Patient Instructions (Signed)
Be Involved in Caring For Your Health:  Taking Medications When medications are taken as directed, they can greatly improve your health. But if they are not taken as prescribed, they may not work. In some cases, not taking them correctly can be harmful. To help ensure your treatment remains effective and safe, understand your medications and how to take them. Bring your medications to each visit for review by your provider.  Your lab results, notes, and after visit summary will be available on My Chart. We strongly encourage you to use this feature. If lab results are abnormal the clinic will contact you with the appropriate steps. If the clinic does not contact you assume the results are satisfactory. You can always view your results on My Chart. If you have questions regarding your health or results, please contact the clinic during office hours. You can also ask questions on My Chart.  We at [facility] are grateful that you chose [place] to provide your care. We strive to provide evidence-based and compassionate care and are always looking for feedback. If you get a survey from the clinic please complete this so we can hear your opinions.  Diabetes Mellitus and Exercise Regular exercise is important for your health, especially if you have diabetes mellitus. Exercise is not just about losing weight. It can also help you increase muscle strength and bone density and reduce body fat and stress. This can help your level of endurance and make you more fit and flexible. Why should I exercise if I have diabetes? Exercise has many benefits for people with diabetes. It can: Help lower and control your blood sugar (glucose). Help your body respond better and become more sensitive to the hormone insulin. Reduce how much insulin your body needs. Lower your risk for heart disease by: Lowering how much "bad" cholesterol and triglycerides you have in your body. Increasing how much "good" cholesterol  you have in your body. Lowering your blood pressure. Lowering your blood glucose levels. What is my activity plan? Your health care provider or an expert trained in diabetes care (certified diabetes educator) can help you make an activity plan. This plan can help you find the type of exercise that works for you. It may also tell you how often to exercise and for how long. Be sure to: Get at least 150 minutes of medium-intensity or high-intensity exercise each week. This may involve brisk walking, biking, or water aerobics. Do stretching and strengthening exercises at least 2 times a week. This may involve yoga or weight lifting. Spread out your activity over at least 3 days of the week. Get some form of physical activity each day. Do not go more than 2 days in a row without some kind of activity. Avoid being inactive for more than 30 minutes at a time. Take frequent breaks to walk or stretch. Choose activities that you enjoy. Set goals that you know you can accomplish. Start slowly and increase the intensity of your exercise over time. How do I manage my diabetes during exercise?  Monitor your blood glucose Check your blood glucose before and after you exercise. If your blood glucose is 240 mg/dL (40.9 mmol/L) or higher before you exercise, check your urine for ketones. These are chemicals created by the liver. If you have ketones in your urine, do not exercise until your blood glucose returns to normal. If your blood glucose is 100 mg/dL (5.6 mmol/L) or lower, eat a snack that has 15-20 grams of carbohydrate in  it. Check your blood glucose 15 minutes after the snack to make sure that your level is above 100 mg/dL (5.6 mmol/L) before you start to exercise. Your risk for low blood glucose (hypoglycemia) goes up during and after exercise. Know the symptoms of this condition and how to treat it. Follow these instructions at home: Keep a carbohydrate snack on hand for use before, during, and after  exercise. This can help prevent or treat hypoglycemia. Avoid injecting insulin into parts of your body that are going to be used during exercise. This may include: Your arms, when you are going to play tennis. Your legs, when you are about to go jogging. Keep track of your exercise habits. This can help you and your health care provider watch and adjust your activity plan. Write down: What you eat before and after you exercise. Blood glucose levels before and after you exercise. The type and amount of exercise you do. Talk to your health care provider before you start a new activity. They may need to: Make sure that the activity is safe for you. Adjust your insulin, other medicines, and food that you eat. Drink water while you exercise. This can stop you from losing too much water (dehydration). It can also prevent problems caused by having a lot of heat in your body (heat stroke). Where to find more information American Diabetes Association: diabetes.org Association of Diabetes Care & Education Specialists: diabeteseducator.org This information is not intended to replace advice given to you by your health care provider. Make sure you discuss any questions you have with your health care provider. Document Revised: 06/06/2022 Document Reviewed: 06/06/2022 Elsevier Patient Education  2024 [employer].

## 2024-04-14 ENCOUNTER — Ambulatory Visit: Payer: Self-pay | Admitting: Nurse Practitioner

## 2024-04-14 ENCOUNTER — Encounter: Payer: Self-pay | Admitting: Nurse Practitioner

## 2024-04-14 VITALS — BP 131/71 | HR 58 | Temp 98.4°F | Ht 75.0 in | Wt 196.6 lb

## 2024-04-14 DIAGNOSIS — I739 Peripheral vascular disease, unspecified: Secondary | ICD-10-CM

## 2024-04-14 DIAGNOSIS — E119 Type 2 diabetes mellitus without complications: Secondary | ICD-10-CM

## 2024-04-14 DIAGNOSIS — E1159 Type 2 diabetes mellitus with other circulatory complications: Secondary | ICD-10-CM | POA: Diagnosis not present

## 2024-04-14 DIAGNOSIS — I5032 Chronic diastolic (congestive) heart failure: Secondary | ICD-10-CM

## 2024-04-14 DIAGNOSIS — Z87891 Personal history of nicotine dependence: Secondary | ICD-10-CM

## 2024-04-14 DIAGNOSIS — I2510 Atherosclerotic heart disease of native coronary artery without angina pectoris: Secondary | ICD-10-CM

## 2024-04-14 DIAGNOSIS — K760 Fatty (change of) liver, not elsewhere classified: Secondary | ICD-10-CM

## 2024-04-14 DIAGNOSIS — I77810 Thoracic aortic ectasia: Secondary | ICD-10-CM

## 2024-04-14 DIAGNOSIS — I700 Atherosclerosis of aorta: Secondary | ICD-10-CM

## 2024-04-14 DIAGNOSIS — Z7984 Long term (current) use of oral hypoglycemic drugs: Secondary | ICD-10-CM

## 2024-04-14 DIAGNOSIS — E1169 Type 2 diabetes mellitus with other specified complication: Secondary | ICD-10-CM

## 2024-04-14 DIAGNOSIS — E1140 Type 2 diabetes mellitus with diabetic neuropathy, unspecified: Secondary | ICD-10-CM | POA: Diagnosis not present

## 2024-04-14 DIAGNOSIS — J432 Centrilobular emphysema: Secondary | ICD-10-CM

## 2024-04-14 LAB — BAYER DCA HB A1C WAIVED: HB A1C (BAYER DCA - WAIVED): 6.7 % — ABNORMAL HIGH (ref 4.8–5.6)

## 2024-04-14 NOTE — Assessment & Plan Note (Signed)
 Chronic, stable. Continue current medication regimen Albuterol and recommend he use if wheezing or SOB. Continue complete cessation of tobacco and continue yearly lung screening. Return in 3 months.

## 2024-04-14 NOTE — Assessment & Plan Note (Signed)
 Chronic, ongoing. Continue current medication regimen. Will adjust as needed. Will continue collaboration with cardiology. Return in 3 months.

## 2024-04-14 NOTE — Assessment & Plan Note (Signed)
 Chronic, ongoing. Continue current medication regimen. Will adjust as needed. Labs: CMP, Lipid panel. Return in 3 months.

## 2024-04-14 NOTE — Assessment & Plan Note (Signed)
 Chronic, ongoing. BP at goal today in office at home. Continue current medication regimen. Will adjust as needed. Will continue collaboration with cardiology. Continue to monitor BP every other day and document for provider. Continue DASH diet. Labs: CMP, Lipid panel. Return in 3 months.

## 2024-04-14 NOTE — Assessment & Plan Note (Signed)
 Chronic, ongoing. Continue current medication regimen ASA and Lipitor. Return in 3 months.

## 2024-04-14 NOTE — Progress Notes (Signed)
 BP 131/71   Pulse (!) 58   Temp 98.4 F (36.9 C) (Oral)   Ht 6\' 3"  (1.905 m)   Wt 196 lb 9.6 oz (89.2 kg)   SpO2 94%   BMI 24.57 kg/m    Subjective:    Patient ID: Brendan [name], male    DOB: 1951/05/19, 73 y.o.   MRN: [id number]  HPI: Brendan [name] is a 73 y.o. male   Chief Complaint  Patient presents with   COPD   Diabetes   Hyperlipidemia   Hypertension   NOTE WRITTEN BY DNP STUDENT.  ASSESSMENT AND PLAN OF CARE REVIEWED WITH STUDENT, AGREE WITH ABOVE FINDINGS AND PLAN.  DIABETES A1c 6.1% last visit.  Taking Metformin 1000 MG BID, Januvia 100 MG daily, and Farxiga 10 MG daily.  Continues Gabapentin 300 MG in morning and 600 MG at bedtime + B12 for neuropathy.  Hypoglycemic episodes:no Polydipsia/polyuria: no Visual disturbance: no Chest pain: no Paresthesias: no Glucose Monitoring: yes  Accucheck frequency:  every other day  Fasting glucose: 80-100  Taking Insulin?: no Blood Pressure:  every other day Retinal Examination: Up to Date Foot Exam: Up to Date Diabetic Education: Completed Pneumovax: Up to Date Influenza: Up to Date Aspirin: yes   HYPERTENSION / HYPERLIPIDEMIA Takes Metoprolol, Hygroton, Amlodipine, and Lipitor. Has never used NTG.  Allergic to Lisinopril.    Followed by cardiology, [facility], with last visit 06/17/23, had stress test and Myo -- EF 66%. Satisfied with current treatment? yes Duration of hypertension: years BP monitoring frequency:  every other day BP medication side effects: no Duration of hyperlipidemia: years Cholesterol medication side effects: no Cholesterol supplements: none Medication compliance: good compliance Aspirin: yes Recent stressors: no Recurrent headaches: no Visual changes: no Palpitations: no Dyspnea: no Chest pain: no Lower extremity edema: no Dizzy/lightheaded: no   COPD Taking Singulair and Albuterol. Lung cancer screening 08/09/23 -- noted aortic atherosclerosis + emphysema.  Past imaging showed hepatic  steatosis + left adrenal nodule which is stable at 1.8 cm -- not noted on recent scan.     Has not smoked in > 7 years since bypass surgery. COPD status: stable Satisfied with current treatment?: yes Oxygen use: no Limitation of activity: no Pneumovax: Up to Date Influenza: Up to Date      04/14/2024    2:02 PM 01/14/2024    2:27 PM 11/26/2023    3:45 PM 10/14/2023    1:55 PM 09/04/2023    8:53 AM  Depression screen PHQ 2/9  Decreased Interest 0 0 0 0 0  Down, Depressed, Hopeless 0 0 0 0 0  PHQ - 2 Score 0 0 0 0 0  Altered sleeping 0 0 0 0 0  Tired, decreased energy 0 0 0 0 0  Change in appetite 0 0 0 0 0  Feeling bad or failure about yourself  0 0 0 0 0  Trouble concentrating 0 0 0 0 0  Moving slowly or fidgety/restless 0 0 0 0 0  Suicidal thoughts 0 0 0 0 0  PHQ-9 Score 0 0 0 0 0  Difficult doing work/chores Not difficult at all Not difficult at all Not difficult at all  Not difficult at all       04/14/2024    2:02 PM 01/14/2024    2:28 PM 10/14/2023    1:55 PM 09/04/2023    8:54 AM  GAD 7 : Generalized Anxiety Score  Nervous, Anxious, on Edge 0 0 0 0  Control/stop worrying 0  0 0 0  Worry too much - different things 0 0 0 0  Trouble relaxing 0 0 0 0  Restless 0 0 0 0  Easily annoyed or irritable 0 0 0 0  Afraid - awful might happen 0 0 0 0  Total GAD 7 Score 0 0 0 0  Anxiety Difficulty Not difficult at all Not difficult at all  Not difficult at all   Relevant past medical, surgical, family and social history reviewed and updated as indicated. Interim medical history since our last visit reviewed. Allergies and medications reviewed and updated.  Review of Systems  Constitutional:  Negative for activity change, diaphoresis, fatigue and fever.  Respiratory:  Negative for cough, chest tightness, shortness of breath and wheezing.   Cardiovascular:  Negative for chest pain, palpitations and leg swelling.  Gastrointestinal: Negative.   Endocrine: Negative for cold  intolerance, heat intolerance, polydipsia, polyphagia and polyuria.  Neurological: Negative.   Psychiatric/Behavioral: Negative.     Per HPI unless specifically indicated above     Objective:    BP 131/71   Pulse (!) 58   Temp 98.4 F (36.9 C) (Oral)   Ht 6\' 3"  (1.905 m)   Wt 196 lb 9.6 oz (89.2 kg)   SpO2 94%   BMI 24.57 kg/m   Wt Readings from Last 3 Encounters:  04/14/24 196 lb 9.6 oz (89.2 kg)  04/02/24 197 lb (89.4 kg)  01/14/24 194 lb 12.8 oz (88.4 kg)    Physical Exam Vitals and nursing note reviewed.  Constitutional:      General: He is awake. He is not in acute distress.    Appearance: He is well-developed and overweight. He is not ill-appearing.  HENT:     Head: Normocephalic and atraumatic.     Right Ear: Hearing normal. No drainage.     Left Ear: Hearing normal. No drainage.  Eyes:     General: Lids are normal.        Right eye: No discharge.        Left eye: No discharge.     Conjunctiva/sclera: Conjunctivae normal.     Pupils: Pupils are equal, round, and reactive to light.  Neck:     Vascular: No carotid bruit.  Cardiovascular:     Rate and Rhythm: Regular rhythm. Bradycardia present.     Heart sounds: Normal heart sounds, S1 normal and S2 normal. No murmur heard.    No gallop.  Pulmonary:     Effort: Pulmonary effort is normal. No accessory muscle usage or respiratory distress.     Breath sounds: Normal breath sounds. No decreased breath sounds, wheezing or rhonchi.  Abdominal:     General: Bowel sounds are normal. There is no distension.     Palpations: Abdomen is soft.     Tenderness: There is no abdominal tenderness.  Musculoskeletal:     Cervical back: Normal range of motion and neck supple.     Right lower leg: No edema.     Left lower leg: No edema.  Skin:    General: Skin is warm and dry.  Neurological:     Mental Status: He is alert and oriented to person, place, and time.  Psychiatric:        Attention and Perception: Attention  normal.        Mood and Affect: Mood normal.        Speech: Speech normal.        Behavior: Behavior normal. Behavior is cooperative.  Results for orders placed or performed in visit on 01/15/24  HM DIABETES EYE EXAM   Collection Time: 01/13/24 12:30 PM  Result Value Ref Range   HM Diabetic Eye Exam No Retinopathy No Retinopathy      Assessment & Plan:   Problem List Items Addressed This Visit       Cardiovascular and Mediastinum   Peripheral vascular disease (HCC) (Chronic)   Chronic, stable. Continue current medication regimen. Continue collaboration with podiatry. Return in 3 months.      Hypertension associated with diabetes (HCC) (Chronic)   Chronic, ongoing. BP at goal today in office at home. Continue current medication regimen. Will adjust as needed. Will continue collaboration with cardiology. Continue to monitor BP every other day and document for provider. Continue DASH diet. Labs: CMP, Lipid panel. Return in 3 months.      Relevant Orders   Bayer DCA Hb A1c Waived   Comprehensive metabolic panel with GFR   Coronary artery disease (Chronic)   Chronic, ongoing. Continue current medication regimen. Will adjust as needed. Will continue collaboration with cardiology. Return in 3 months.      Aortic atherosclerosis (HCC) (Chronic)   Chronic, ongoing. Continue current medication regimen ASA and Lipitor. Return in 3 months.      Chronic heart failure with preserved ejection fraction (HFpEF) (HCC) (Chronic)   Chronic, ongoing. Continue current medication regimen as established by cardiology. Will continue collaboration with cardiology. Continue Farxiga for prevention.  - Reminded to call when he has an overnight weight gain of >2 pounds or a weekly weight weight gain of >5 pounds - Reminded not adding salt to his food and continue reading food labels. Reminded of the importance of keeping his daily sodium intake to 2000mg  daily  - Avoid NSAID medications       Dilated aortic root (HCC) (Chronic)   Followed by cardiology. Continue collaboration with cardiology.        Respiratory   Centrilobular emphysema (HCC) (Chronic)   Chronic, stable. Continue current medication regimen Albuterol and recommend he use if wheezing or SOB. Continue complete cessation of tobacco and continue yearly lung screening. Return in 3 months.        Digestive   Hepatic steatosis   Chronic. Recommend to continue focus on healthy diet. Will continue to monitor. CMP today. Return in 3 months.        Endocrine   Controlled type 2 diabetes with neuropathy (HCC) - Primary (Chronic)   Chronic, ongoing. A1c 6.7% today. Continue current medication regimen Metformin, Farxiga, and Januvia. Will adjust Metformin or Januvia based on kidney function. Recommend continued focus on health diet. Recommend eating smaller high protein, low fat meals more frequently. Continue to check BS every other day and document for provider. Return in 3 months.      Relevant Orders   Bayer DCA Hb A1c Waived   Comprehensive metabolic panel with GFR   Hyperlipidemia associated with type 2 diabetes mellitus (HCC) (Chronic)   Chronic, ongoing. Continue current medication regimen. Will adjust as needed. Labs: CMP, Lipid panel. Return in 3 months.      Relevant Orders   Bayer DCA Hb A1c Waived   Comprehensive metabolic panel with GFR   Lipid Panel w/o Chol/HDL Ratio   Diabetes mellitus treated with oral medication (HCC)   Continue current medication regimen Metformin, Farxiga, and Januvia. Continue to check BS every other day and document for provider. Return in 3 months.      Relevant Orders  Bayer DCA Hb A1c Waived   Comprehensive metabolic panel with GFR     Other   Personal history of tobacco use, presenting hazards to health   Continue complete cessation of tobacco. Continue annual Lung Screening.        Follow up plan: Return in about 3 months (around 07/14/2024) for T2DM, HTN/HLD,  COPD.

## 2024-04-14 NOTE — Assessment & Plan Note (Signed)
 Chronic. Recommend to continue focus on healthy diet. Will continue to monitor. CMP today. Return in 3 months.

## 2024-04-14 NOTE — Assessment & Plan Note (Signed)
 Chronic, ongoing. A1c 6.7% today. Continue current medication regimen Metformin, Farxiga, and Januvia. Will adjust Metformin or Januvia based on kidney function. Recommend continued focus on health diet. Recommend eating smaller high protein, low fat meals more frequently. Continue to check BS every other day and document for provider. Return in 3 months.

## 2024-04-14 NOTE — Assessment & Plan Note (Signed)
 Chronic, stable. Continue current medication regimen. Continue collaboration with podiatry. Return in 3 months.

## 2024-04-14 NOTE — Assessment & Plan Note (Signed)
 Continue complete cessation of tobacco. Continue annual Lung Screening.

## 2024-04-14 NOTE — Assessment & Plan Note (Signed)
 Continue current medication regimen Metformin, Farxiga, and Januvia. Continue to check BS every other day and document for provider. Return in 3 months.

## 2024-04-14 NOTE — Assessment & Plan Note (Signed)
 Chronic, ongoing. Continue current medication regimen as established by cardiology. Will continue collaboration with cardiology. Continue Farxiga for prevention.  - Reminded to call when he has an overnight weight gain of >2 pounds or a weekly weight weight gain of >5 pounds - Reminded not adding salt to his food and continue reading food labels. Reminded of the importance of keeping his daily sodium intake to 2000mg  daily  - Avoid NSAID medications

## 2024-04-14 NOTE — Assessment & Plan Note (Signed)
 Followed by cardiology. Continue collaboration with cardiology.

## 2024-04-14 NOTE — Progress Notes (Deleted)
 There were no vitals taken for this visit.   Subjective:    Patient ID: Brendan [name], male    DOB: 07/30/1951, 73 y.o.   MRN: [id number]  HPI: Brendan [name] is a 73 y.o. male  Chief Complaint  Patient presents with   COPD   Diabetes   Hyperlipidemia   Hypertension   DIABETES A1c 6.1% last visit.  Taking Metformin 1000 MG BID, Januvia 100 MG daily, and Farxiga 10 MG daily.  Continues Gabapentin 300 MG in morning and 600 MG at bedtime + B12 for neuropathy.  Hypoglycemic episodes:{Blank single:19197::"yes","no"} Polydipsia/polyuria: {Blank single:19197::"yes","no"} Visual disturbance: {Blank single:19197::"yes","no"} Chest pain: {Blank single:19197::"yes","no"} Paresthesias: {Blank single:19197::"yes","no"} Glucose Monitoring: {Blank single:19197::"yes","no"}  Accucheck frequency: {Blank single:19197::"Not Checking","Daily","BID","TID"}  Fasting glucose:  Post prandial:  Evening:  Before meals: Taking Insulin?: {Blank single:19197::"yes","no"}  Long acting insulin:  Short acting insulin: Blood Pressure Monitoring: {Blank single:19197::"not checking","rarely","daily","weekly","monthly","a few times a day","a few times a week","a few times a month"} Retinal Examination: {Blank single:19197::"Up to Date","Not up to Date"} Foot Exam: {Blank single:19197::"Up to Date","Not up to Date"} Diabetic Education: {Blank single:19197::"Completed","Not Completed"} Pneumovax: {Blank single:19197::"Up to Date","Not up to Date","unknown"} Influenza: {Blank single:19197::"Up to Date","Not up to Date","unknown"} Aspirin: {Blank single:19197::"yes","no"}   HYPERTENSION / HYPERLIPIDEMIA Takes Metoprolol, Hygroton, Amlodipine, and Lipitor. Has never used NTG.  Allergic to Lisinopril.    Followed by cardiology, PA Dunn, with last visit 06/17/23, had stress test and Myo -- EF 66%. Satisfied with current treatment? {Blank single:19197::"yes","no"} Duration of hypertension: {Blank  single:19197::"chronic","months","years"} BP monitoring frequency: {Blank single:19197::"not checking","rarely","daily","weekly","monthly","a few times a day","a few times a week","a few times a month"} BP range:  BP medication side effects: {Blank single:19197::"yes","no"} Duration of hyperlipidemia: {Blank single:19197::"chronic","months","years"} Cholesterol medication side effects: {Blank single:19197::"yes","no"} Cholesterol supplements: {Blank multiple:19196::"none","fish oil","niacin","red yeast rice"} Medication compliance: {Blank single:19197::"excellent compliance","good compliance","fair compliance","poor compliance"} Aspirin: {Blank single:19197::"yes","no"} Recent stressors: {Blank single:19197::"yes","no"} Recurrent headaches: {Blank single:19197::"yes","no"} Visual changes: {Blank single:19197::"yes","no"} Palpitations: {Blank single:19197::"yes","no"} Dyspnea: {Blank single:19197::"yes","no"} Chest pain: {Blank single:19197::"yes","no"} Lower extremity edema: {Blank single:19197::"yes","no"} Dizzy/lightheaded: {Blank single:19197::"yes","no"}   COPD Taking Singulair and Albuterol. Lung cancer screening 08/09/23 -- noted aortic atherosclerosis + emphysema.  Past imaging showed hepatic steatosis + left adrenal nodule which is stable at 1.8 cm -- not noted on recent scan.     Has not smoked in > 7 years since bypass surgery. COPD status: {Blank single:19197::"controlled","uncontrolled","better","worse","exacerbated","stable"} Satisfied with current treatment?: {Blank single:19197::"yes","no"} Oxygen use: {Blank single:19197::"yes","no"} Dyspnea frequency:  Cough frequency:  Rescue inhaler frequency:   Limitation of activity: {Blank single:19197::"yes","no"} Productive cough:  Last Spirometry:  Pneumovax: {Blank single:19197::"Up to Date","Not up to Date","unknown"} Influenza: {Blank single:19197::"Up to Date","Not up to Date","unknown"}     01/14/2024    2:27 PM  11/26/2023    3:45 PM 10/14/2023    1:55 PM 09/04/2023    8:53 AM 07/17/2023    2:44 PM  Depression screen PHQ 2/9  Decreased Interest 0 0 0 0 0  Down, Depressed, Hopeless 0 0 0 0 0  PHQ - 2 Score 0 0 0 0 0  Altered sleeping 0 0 0 0 0  Tired, decreased energy 0 0 0 0 0  Change in appetite 0 0 0 0 0  Feeling bad or failure about yourself  0 0 0 0 0  Trouble concentrating 0 0 0 0 0  Moving slowly or fidgety/restless 0 0 0 0 0  Suicidal thoughts 0 0 0 0 0  PHQ-9 Score 0 0 0 0 0  Difficult doing work/chores Not difficult at all Not difficult  at all  Not difficult at all Not difficult at all       01/14/2024    2:28 PM 10/14/2023    1:55 PM 09/04/2023    8:54 AM 07/17/2023    2:44 PM  GAD 7 : Generalized Anxiety Score  Nervous, Anxious, on Edge 0 0 0 0  Control/stop worrying 0 0 0 0  Worry too much - different things 0 0 0 0  Trouble relaxing 0 0 0 0  Restless 0 0 0 0  Easily annoyed or irritable 0 0 0 0  Afraid - awful might happen 0 0 0 0  Total GAD 7 Score 0 0 0 0  Anxiety Difficulty Not difficult at all  Not difficult at all Not difficult at all   Relevant past medical, surgical, family and social history reviewed and updated as indicated. Interim medical history since our last visit reviewed. Allergies and medications reviewed and updated.  Review of Systems  Per HPI unless specifically indicated above     Objective:    There were no vitals taken for this visit.  Wt Readings from Last 3 Encounters:  04/02/24 197 lb (89.4 kg)  01/14/24 194 lb 12.8 oz (88.4 kg)  11/26/23 196 lb 12.8 oz (89.3 kg)    Physical Exam  Results for orders placed or performed in visit on 01/15/24  HM DIABETES EYE EXAM   Collection Time: 01/13/24 12:30 PM  Result Value Ref Range   HM Diabetic Eye Exam No Retinopathy No Retinopathy      Assessment & Plan:   Problem List Items Addressed This Visit       Cardiovascular and Mediastinum   Chronic heart failure with preserved ejection  fraction (HFpEF) (HCC) (Chronic)   Coronary artery disease (Chronic)   Dilated aortic root (HCC) (Chronic)   Hypertension associated with diabetes (HCC) (Chronic)   Relevant Orders   Bayer DCA Hb A1c Waived   Comprehensive metabolic panel with GFR   Peripheral vascular disease (HCC) (Chronic)     Respiratory   Centrilobular emphysema (HCC) (Chronic)     Digestive   Hepatic steatosis     Endocrine   Controlled type 2 diabetes with neuropathy (HCC) - Primary (Chronic)   Relevant Orders   Bayer DCA Hb A1c Waived   Comprehensive metabolic panel with GFR   Hyperlipidemia associated with type 2 diabetes mellitus (HCC) (Chronic)   Relevant Orders   Bayer DCA Hb A1c Waived   Comprehensive metabolic panel with GFR   Lipid Panel w/o Chol/HDL Ratio   Diabetes mellitus treated with oral medication (HCC)   Relevant Orders   Bayer DCA Hb A1c Waived   Comprehensive metabolic panel with GFR     Other   Personal history of tobacco use, presenting hazards to health     Follow up plan: No follow-ups on file.

## 2024-04-15 LAB — COMPREHENSIVE METABOLIC PANEL WITH GFR
ALT: 20 IU/L (ref 0–44)
AST: 16 IU/L (ref 0–40)
Albumin: 4.7 g/dL (ref 3.8–4.8)
Alkaline Phosphatase: 68 IU/L (ref 44–121)
BUN/Creatinine Ratio: 19 (ref 10–24)
BUN: 26 mg/dL (ref 8–27)
Bilirubin Total: 0.4 mg/dL (ref 0.0–1.2)
CO2: 23 mmol/L (ref 20–29)
Calcium: 9.8 mg/dL (ref 8.6–10.2)
Chloride: 105 mmol/L (ref 96–106)
Creatinine, Ser: 1.4 mg/dL — ABNORMAL HIGH (ref 0.76–1.27)
Globulin, Total: 1.8 g/dL (ref 1.5–4.5)
Glucose: 102 mg/dL — ABNORMAL HIGH (ref 70–99)
Potassium: 4.4 mmol/L (ref 3.5–5.2)
Sodium: 145 mmol/L — ABNORMAL HIGH (ref 134–144)
Total Protein: 6.5 g/dL (ref 6.0–8.5)
eGFR: 53 mL/min/{1.73_m2} — ABNORMAL LOW (ref >59)

## 2024-04-15 LAB — LIPID PANEL W/O CHOL/HDL RATIO
Cholesterol, Total: 97 mg/dL — ABNORMAL LOW (ref 100–199)
HDL: 31 mg/dL — ABNORMAL LOW (ref >39)
LDL Chol Calc (NIH): 50 mg/dL (ref 0–99)
Triglycerides: 80 mg/dL (ref 0–149)
VLDL Cholesterol Cal: 16 mg/dL (ref 5–40)

## 2024-04-15 NOTE — Progress Notes (Signed)
 Please let Loron know his labs have returned.  Kidneys continue to show Stage 3a kidney disease with no worsening.  Sodium level jsut very mildly elevated.  Cut back on salt intake at home. Lipid panel shows at goal levels.  No medication changes needed.  Any questions? Keep being stellar!!  Thank you for allowing me to participate in your care.  I appreciate you. Kindest regards, Reonna Finlayson

## 2024-04-20 ENCOUNTER — Encounter: Payer: Self-pay | Admitting: Gastroenterology

## 2024-04-20 ENCOUNTER — Other Ambulatory Visit: Payer: Self-pay | Admitting: Podiatry

## 2024-04-21 ENCOUNTER — Other Ambulatory Visit: Payer: Self-pay

## 2024-04-21 ENCOUNTER — Ambulatory Visit: Admitting: Anesthesiology

## 2024-04-21 ENCOUNTER — Encounter: Payer: Self-pay | Admitting: Gastroenterology

## 2024-04-21 ENCOUNTER — Ambulatory Visit
Admission: RE | Admit: 2024-04-21 | Discharge: 2024-04-21 | Disposition: A | Discharge status: Home / Self Care | Attending: Gastroenterology | Admitting: Gastroenterology

## 2024-04-21 ENCOUNTER — Encounter
Admission: RE | Disposition: A | Discharge status: Home / Self Care | Payer: Self-pay | Source: Home / Self Care | Attending: Gastroenterology

## 2024-04-21 DIAGNOSIS — Z79899 Other long term (current) drug therapy: Secondary | ICD-10-CM | POA: Insufficient documentation

## 2024-04-21 DIAGNOSIS — Z951 Presence of aortocoronary bypass graft: Secondary | ICD-10-CM | POA: Diagnosis not present

## 2024-04-21 DIAGNOSIS — Z7951 Long term (current) use of inhaled steroids: Secondary | ICD-10-CM | POA: Diagnosis not present

## 2024-04-21 DIAGNOSIS — E1122 Type 2 diabetes mellitus with diabetic chronic kidney disease: Secondary | ICD-10-CM | POA: Diagnosis not present

## 2024-04-21 DIAGNOSIS — N183 Chronic kidney disease, stage 3 unspecified: Secondary | ICD-10-CM | POA: Diagnosis not present

## 2024-04-21 DIAGNOSIS — I498 Other specified cardiac arrhythmias: Secondary | ICD-10-CM | POA: Diagnosis not present

## 2024-04-21 DIAGNOSIS — J449 Chronic obstructive pulmonary disease, unspecified: Secondary | ICD-10-CM | POA: Insufficient documentation

## 2024-04-21 DIAGNOSIS — I509 Heart failure, unspecified: Secondary | ICD-10-CM | POA: Insufficient documentation

## 2024-04-21 DIAGNOSIS — Z7984 Long term (current) use of oral hypoglycemic drugs: Secondary | ICD-10-CM | POA: Diagnosis not present

## 2024-04-21 DIAGNOSIS — K5730 Diverticulosis of large intestine without perforation or abscess without bleeding: Secondary | ICD-10-CM | POA: Insufficient documentation

## 2024-04-21 DIAGNOSIS — I2510 Atherosclerotic heart disease of native coronary artery without angina pectoris: Secondary | ICD-10-CM | POA: Insufficient documentation

## 2024-04-21 DIAGNOSIS — Z1211 Encounter for screening for malignant neoplasm of colon: Secondary | ICD-10-CM

## 2024-04-21 DIAGNOSIS — K635 Polyp of colon: Secondary | ICD-10-CM

## 2024-04-21 DIAGNOSIS — Z87891 Personal history of nicotine dependence: Secondary | ICD-10-CM | POA: Diagnosis not present

## 2024-04-21 DIAGNOSIS — D125 Benign neoplasm of sigmoid colon: Secondary | ICD-10-CM | POA: Insufficient documentation

## 2024-04-21 DIAGNOSIS — I700 Atherosclerosis of aorta: Secondary | ICD-10-CM | POA: Insufficient documentation

## 2024-04-21 DIAGNOSIS — K640 First degree hemorrhoids: Secondary | ICD-10-CM | POA: Insufficient documentation

## 2024-04-21 DIAGNOSIS — I130 Hypertensive heart and chronic kidney disease with heart failure and stage 1 through stage 4 chronic kidney disease, or unspecified chronic kidney disease: Secondary | ICD-10-CM | POA: Insufficient documentation

## 2024-04-21 DIAGNOSIS — D120 Benign neoplasm of cecum: Secondary | ICD-10-CM | POA: Diagnosis not present

## 2024-04-21 DIAGNOSIS — E1151 Type 2 diabetes mellitus with diabetic peripheral angiopathy without gangrene: Secondary | ICD-10-CM | POA: Insufficient documentation

## 2024-04-21 HISTORY — PX: COLONOSCOPY: SHX5424

## 2024-04-21 LAB — GLUCOSE, CAPILLARY: Glucose-Capillary: 143 mg/dL — ABNORMAL HIGH (ref 70–99)

## 2024-04-21 SURGERY — COLONOSCOPY
Anesthesia: General

## 2024-04-21 MED ORDER — SODIUM CHLORIDE 0.9 % IV SOLN: INTRAVENOUS | Status: DC

## 2024-04-21 MED ORDER — LIDOCAINE HCL (CARDIAC) PF 100 MG/5ML IV SOSY
PREFILLED_SYRINGE | INTRAVENOUS | Status: DC | PRN
  Administered 2024-04-21: 40 mg via INTRAVENOUS

## 2024-04-21 MED ORDER — PROPOFOL 500 MG/50ML IV EMUL
INTRAVENOUS | Status: DC | PRN
  Administered 2024-04-21: 125 ug/kg/min via INTRAVENOUS

## 2024-04-21 MED ORDER — PROPOFOL 10 MG/ML IV BOLUS
INTRAVENOUS | Status: DC | PRN
  Administered 2024-04-21 (×2): 40 mg via INTRAVENOUS

## 2024-04-21 MED ORDER — GLYCOPYRROLATE 0.2 MG/ML IJ SOLN
INTRAMUSCULAR | Status: DC | PRN
  Administered 2024-04-21: .1 mg via INTRAVENOUS

## 2024-04-21 NOTE — H&P (Signed)
 Marnee Sink, MD [facility] [address] [place], [place] [address] Phone: [phone] Fax : [phone]  Primary Care Physician:  Lemar Pyles, NP Primary Gastroenterologist:  Dr. Ole Berkeley  Pre-Procedure History & Physical: HPI:  Brendan [name] is a 73 y.o. male is here for a screening colonoscopy.   Past Medical History:  Diagnosis Date   CAD (coronary artery disease)    a. 05/2016 Lexi MV: EF 58%, no ischemia/infarct; b. 05/2017 Cath: LM 20-30, LAD 50p, 51m, D1 50p, D2 80ost, LCX 90/70ost, RCA 80/90p/m, RPL1 50p, EF 50-55%; c. 06/2017 CABG x 4 (LIMA->LAD, VG->D2,VG->OM, VG->PDA).   Diabetes mellitus without complication (HCC)    Diastolic dysfunction    a. 05/2016 Echo: EF 55%, Gr1 DD, mild TR/MR.   Hyperlipidemia    Hypertension    Hypokalemia    Personal history of tobacco use, presenting hazards to health 05/03/2016   Post-op Afib    a. 06/2017 following CABG->short course of amio.    Past Surgical History:  Procedure Laterality Date   BACK SURGERY     CARDIAC CATHETERIZATION     COLONOSCOPY     CORONARY ARTERY BYPASS GRAFT N/A 07/01/2017   Procedure: CORONARY ARTERY BYPASS GRAFTING (CABG) x4, using left mammary artery and right greater saphenous vein harvested endoscopically;  Surgeon: Heriberto London, MD;  Location: [facility] OR;  Service: Open Heart Surgery;  Laterality: N/A;   KNEE SURGERY     LEFT HEART CATH AND CORONARY ANGIOGRAPHY N/A 06/04/2017   Procedure: Left Heart Cath and Coronary Angiography;  Surgeon: Sammy Crisp, MD;  Location: [facility] INVASIVE CV LAB;  Service: Cardiovascular;  Laterality: N/A;   TEE WITHOUT CARDIOVERSION N/A 07/01/2017   Procedure: TRANSESOPHAGEAL ECHOCARDIOGRAM (TEE);  Surgeon: Matt Song, Donata Fryer, MD;  Location: [facility] OR;  Service: Open Heart Surgery;  Laterality: N/A;    Prior to Admission medications   Medication Sig Start Date End Date Taking? Authorizing Provider  acetaminophen  (TYLENOL ) 500 MG tablet Take 1 tablet (500 mg total) by mouth  every 6 (six) hours as needed for mild pain or headache. 07/09/17  Yes Zimmerman, Donielle M, PA-C  amLODipine  (NORVASC ) 5 MG tablet Take 1 tablet (5 mg total) by mouth daily. 04/15/23  Yes [name], [name], NP  aspirin  EC 81 MG tablet Take 1 tablet (81 mg total) by mouth daily. 12/19/17  Yes End, Veryl Gottron, MD  atorvastatin  (LIPITOR) 20 MG tablet Take 1 tablet (20 mg total) by mouth daily. 12/12/23  Yes [name], [name], NP  cyanocobalamin  1000 MCG tablet Take 1,000 mcg by mouth every other day.   Yes [provider]  dapagliflozin  propanediol (FARXIGA ) 10 MG TABS tablet Take 1 tablet (10 mg total) by mouth daily before breakfast. 10/16/23  Yes [name], [name], NP  gabapentin  (NEURONTIN ) 300 MG capsule Take one capsule at lunchtime and two capsules at bedtime 04/20/24  Yes Hyatt, Max T, DPM  loratadine  (CLARITIN ) 10 MG tablet Take 1 tablet (10 mg total) by mouth daily. 12/15/18  Yes [name], [name], NP  metFORMIN  (GLUCOPHAGE ) 1000 MG tablet Take 1 tablet (1,000 mg total) by mouth 2 (two) times daily with a meal. 03/03/24  Yes [name], [name], NP  metoprolol  tartrate (LOPRESSOR ) 25 MG tablet Take 0.5 tablets (12.5 mg total) by mouth 2 (two) times daily. 08/29/23  Yes [name], [name], NP  montelukast  (SINGULAIR ) 10 MG tablet Take 1 tablet (10 mg total) by mouth at bedtime. 04/15/23  Yes [name], [name], NP  potassium chloride  SA (  KLOR-CON  M) 20 MEQ tablet Take 2 tablets (40 meq) by mouth once daily 12/26/23  Yes End, Veryl Gottron, MD  sitaGLIPtin  (JANUVIA ) 100 MG tablet Take 1 tablet (100 mg total) by mouth daily. 01/14/24  Yes [name], [name], NP  tamsulosin  (FLOMAX ) 0.4 MG CAPS capsule Take 1 capsule (0.4 mg total) by mouth daily. 02/25/24  Yes [name], [name], NP  albuterol  (VENTOLIN  HFA) 108 (90 Base) MCG/ACT inhaler Inhale 2 puffs into the lungs every 6 (six) hours as needed for wheezing or shortness of breath. 03/30/22   [name], [name], NP  azelastine  (ASTELIN ) 0.1 % nasal  spray Place 2 sprays into both nostrils at bedtime. 02/15/17   [provider]  chlorthalidone  (HYGROTON ) 25 MG tablet Take 1 tablet (25 mg total) by mouth daily. 03/10/24   [name], [name], NP  lidocaine  (LIDODERM ) 5 % Place 1 patch onto the skin daily. Remove & Discard patch within 12 hours or as directed by MD 11/07/22   [name], [name], NP  nitroGLYCERIN  (NITROSTAT ) 0.4 MG SL tablet Place 1 tablet (0.4 mg total) under the tongue every 5 (five) minutes as needed for chest pain. 04/13/21   End, Veryl Gottron, MD  sildenafil  (REVATIO ) 20 MG tablet TAKE 1 TO 5 TABLETS AS NEEDED. 06/16/20   [name], [name], NP  tiZANidine  (ZANAFLEX ) 4 MG tablet Take 1 tablet (4 mg total) by mouth every 6 (six) hours as needed for muscle spasms. 08/14/23   [name], [name], NP    Allergies as of 03/09/2024 - Review Complete 01/29/2024  Allergen Reaction Noted   Lisinopril  Other (See Comments) 12/17/2017    Family History  Problem Relation Age of Onset   Hypertension Mother    Diabetes Mother    Heart disease Father 68       multiple MI's    Social History   Socioeconomic History   Marital status: Widowed    Spouse name: Not on file   Number of children: 3   Years of education: Not on file   Highest education level: 10th grade  Occupational History   Occupation: works part time as Copy   Tobacco Use   Smoking status: Former    Current packs/day: 0.00    Average packs/day: 1 pack/day for 60.0 years (60.0 ttl pk-yrs)    Types: Cigarettes    Start date: 06/29/1957    Quit date: 06/29/2017    Years since quitting: 6.8   Smokeless tobacco: Never  Vaping Use   Vaping status: Never Used  Substance and Sexual Activity   Alcohol use: No    Comment: previously drank heavily but quit ~ 21 yrs ago.   Drug use: No   Sexual activity: Yes  Other Topics Concern   Not on file  Social History Narrative   Lives in [place] by himself.  Works as Janitor 5 hours per day, 5 days per week. Does not  routinely exercise.   Social Drivers of Corporate investment banker Strain: Low Risk  (11/26/2023)   Overall Financial Resource Strain (CARDIA)    Difficulty of Paying Living Expenses: Not hard at all  Food Insecurity: No Food Insecurity (11/26/2023)   Hunger Vital Sign    Worried About Running Out of Food in the Last Year: Never true    Ran Out of Food in the Last Year: Never true  Transportation Needs: No Transportation Needs (11/26/2023)   PRAPARE - Administrator, Civil Service (Medical): No    Lack  of Transportation (Non-Medical): No  Physical Activity: Sufficiently Active (11/26/2023)   Exercise Vital Sign    Days of Exercise per Week: 5 days    Minutes of Exercise per Session: 30 min  Stress: No Stress Concern Present (11/26/2023)   [employer] of Occupational Health - Occupational Stress Questionnaire    Feeling of Stress : Not at all  Social Connections: Socially Isolated (11/26/2023)   Social Connection and Isolation Panel [NHANES]    Frequency of Communication with Friends and Family: Three times a week    Frequency of Social Gatherings with Friends and Family: More than three times a week    Attends Religious Services: Never    Database administrator or Organizations: No    Attends Banker Meetings: Never    Marital Status: Widowed  Intimate Partner Violence: Not At Risk (11/26/2023)   Humiliation, Afraid, Rape, and Kick questionnaire    Fear of Current or Ex-Partner: No    Emotionally Abused: No    Physically Abused: No    Sexually Abused: No    Review of Systems: See HPI, otherwise negative ROS  Physical Exam: BP (!) 130/99   Pulse 73   Temp (!) 96.8 F (36 C) (Temporal)   Resp 16   Ht 6\' 3"  (1.905 m)   Wt 86.5 kg   SpO2 99%   BMI 23.82 kg/m  General:   Alert,  pleasant and cooperative in NAD Head:  Normocephalic and atraumatic. Neck:  Supple; no masses or thyromegaly. Lungs:  Clear throughout to auscultation.     Heart:  Regular rate and rhythm. Abdomen:  Soft, nontender and nondistended. Normal bowel sounds, without guarding, and without rebound.   Neurologic:  Alert and  oriented x4;  grossly normal neurologically.  Impression/Plan: Brendan [name] is now here to undergo a screening colonoscopy.  Risks, benefits, and alternatives regarding colonoscopy have been reviewed with the patient.  Questions have been answered.  All parties agreeable.

## 2024-04-21 NOTE — Op Note (Signed)
 [facility] Gastroenterology Patient Name: Britten Seyfried Procedure Date: 04/21/2024 10:51 AM MRN: [id number] Account #: [id number] Date of Birth: April 04, 1951 Admit Type: Outpatient Age: 73 Room: [facility] ENDO ROOM 4 Gender: Male Note Status: Finalized Instrument Name: Hyman Main [id number] Procedure:             Colonoscopy Indications:           Screening for colorectal malignant neoplasm Providers:             Marnee Sink MD, MD Referring MD:          Lavelle Posey. [name] (Referring MD) Medicines:             Propofol  per Anesthesia Complications:         No immediate complications. Procedure:             Pre-Anesthesia Assessment:                        - Prior to the procedure, a History and Physical was                         performed, and patient medications and allergies were                         reviewed. The patient's tolerance of previous                         anesthesia was also reviewed. The risks and benefits                         of the procedure and the sedation options and risks                         were discussed with the patient. All questions were                         answered, and informed consent was obtained. Prior                         Anticoagulants: The patient has taken no anticoagulant                         or antiplatelet agents. ASA Grade Assessment: II - A                         patient with mild systemic disease. After reviewing                         the risks and benefits, the patient was deemed in                         satisfactory condition to undergo the procedure.                        After obtaining informed consent, the colonoscope was                         passed under direct vision. Throughout the procedure,  the patient's blood pressure, pulse, and oxygen                          saturations were monitored continuously. The                         Colonoscope was introduced through the  anus and                         advanced to the the cecum, identified by appendiceal                         orifice and ileocecal valve. The colonoscopy was                         performed without difficulty. The patient tolerated                         the procedure well. The quality of the bowel                         preparation was excellent. Findings:      The perianal and digital rectal examinations were normal.      A 5 mm polyp was found in the cecum. The polyp was sessile. The polyp       was removed with a cold snare. Resection and retrieval were complete.      Two sessile polyps were found in the sigmoid colon. The polyps were 3 to       5 mm in size. These polyps were removed with a cold snare. Resection and       retrieval were complete.      Non-bleeding internal hemorrhoids were found during retroflexion. The       hemorrhoids were Grade I (internal hemorrhoids that do not prolapse).      Multiple small-mouthed diverticula were found in the entire colon. Impression:            - One 5 mm polyp in the cecum, removed with a cold                         snare. Resected and retrieved.                        - Two 3 to 5 mm polyps in the sigmoid colon, removed                         with a cold snare. Resected and retrieved.                        - Non-bleeding internal hemorrhoids.                        - Diverticulosis in the entire examined colon. Recommendation:        - Discharge patient to home.                        - Resume previous diet.                        -  Continue present medications.                        - Await pathology results.                        - Repeat colonoscopy is not recommended for                         surveillance. Procedure Code(s):     --- Professional ---                        [phone], Colonoscopy, flexible; with removal of                         tumor(s), polyp(s), or other lesion(s) by snare                          technique Diagnosis Code(s):     --- Professional ---                        Z12.11, Encounter for screening for malignant neoplasm                         of colon                        D12.5, Benign neoplasm of sigmoid colon CPT copyright 2022 American Medical Association. All rights reserved. The codes documented in this report are preliminary and upon coder review may  be revised to meet current compliance requirements. Marnee Sink MD, MD 04/21/2024 11:12:30 AM This report has been signed electronically. Number of Addenda: 0 Note Initiated On: 04/21/2024 10:51 AM Scope Withdrawal Time: 0 hours 8 minutes 53 seconds  Total Procedure Duration: 0 hours 11 minutes 53 seconds  Estimated Blood Loss:  Estimated blood loss: none.      [facility]

## 2024-04-21 NOTE — Anesthesia Preprocedure Evaluation (Addendum)
 Anesthesia Evaluation  Patient identified by MRN, date of birth, ID band Patient awake    Reviewed: Allergy & Precautions, NPO status , Patient's Chart, lab work & pertinent test results  History of Anesthesia Complications Negative for: history of anesthetic complications  Airway Mallampati: IV   Neck ROM: Full    Dental  (+) Edentulous Upper, Edentulous Lower   Pulmonary COPD, former smoker (quit 2018)   Pulmonary exam normal breath sounds clear to auscultation       Cardiovascular hypertension, + CAD (s/p CABG), + Peripheral Vascular Disease and +CHF (preserved EF)  Normal cardiovascular exam Rhythm:Regular Rate:Normal  ECG 04/02/24: sinus arrhythmia, rate 73 bpm  Echo 08/07/23: 1. Left ventricular ejection fraction, by estimation, is 50 to 55%. The left ventricle has low normal function. Left ventricular endocardial border not optimally defined to evaluate regional wall motion. There is mild left ventricular hypertrophy. Left  ventricular diastolic parameters are consistent with Grade II diastolic dysfunction (pseudonormalization).   2. Right ventricular systolic function is normal. The right ventricular size is mildly enlarged.   3. Right atrial size was mildly dilated.   4. The mitral valve is grossly normal. No evidence of mitral valve regurgitation.   5. The aortic valve was not well visualized. Aortic valve regurgitation is not visualized. No aortic stenosis is present.   6. Pulmonic valve regurgitation not well-assessed.   Myocardial perfusion 06/21/23:  Pharmacological myocardial perfusion imaging study with no significant  ischemia Normal wall motion, EF estimated at 66% No EKG changes concerning for ischemia at peak stress or in recovery. CT attenuation correction images with mild aortic atherosclerosis, severe three-vessel coronary calcification Low risk scan   Neuro/Psych  Neuromuscular disease (neuropathy)     GI/Hepatic negative GI ROS,,,  Endo/Other  diabetes, Type 2    Renal/GU Renal disease (stage III CKD)     Musculoskeletal   Abdominal   Peds  Hematology negative hematology ROS (+)   Anesthesia Other Findings Cardiology note 04/02/24:  Preoperative cardiac risk stratification - Patient is scheduled for colonoscopy 04/21/2024 to screen for colon cancer. According to the RCRI, patient has a 10.1% risk of MACE. Patient reports activity equivalent to 4.0 METS (mopping, climbing stairs). Based on ACC/AHA guidelines, Mr. Guilmette would be at acceptable risk for the planned procedure without further cardiovascular testing. Regarding aspirin  therapy, we recommend continuation of aspirin  throughout the perioperative period.    CAD s/p CABG without angina - Patient is without symptoms of angina and cardiac decompensation. Continue risk factor modification and secondary prevention with aspirin , amlodipine , atorvastatin , and sublingual nitroglycerin  as needed.    Hypertension - BP well controlled per office and reported at home readings. Continue amlodipine , chlorthalidone , and metoprolol  tartrate.    HFpEF - Appears euvolemic and well compensated on exam. Continue antihypertensives as above. Continue Farxiga . Not requiring a standing loop diuretic.    Carotid artery stenosis - Stable to improved on most recent US  08/2023.   Hyperlipidemia - Most recent lipid panel with LDL 56. Continue atorvastatin .    Positional dizziness - No symptoms since last visit. No further testing indicated.    Disposition: F/u with Dr. Nolan Battle or an APP in 6 months.  Reproductive/Obstetrics                             Anesthesia Physical Anesthesia Plan  ASA: 3  Anesthesia Plan: General   Post-op Pain Management:    Induction: Intravenous  PONV Risk Score  and Plan: 2 and Propofol  infusion, TIVA and Treatment may vary due to age or medical condition  Airway Management Planned: Natural  Airway  Additional Equipment:   Intra-op Plan:   Post-operative Plan:   Informed Consent: I have reviewed the patients History and Physical, chart, labs and discussed the procedure including the risks, benefits and alternatives for the proposed anesthesia with the patient or authorized representative who has indicated his/her understanding and acceptance.       Plan Discussed with: CRNA  Anesthesia Plan Comments: (LMA/GETA backup discussed.  Patient consented for risks of anesthesia including but not limited to:  - adverse reactions to medications - damage to eyes, teeth, lips or other oral mucosa - nerve damage due to positioning  - sore throat or hoarseness - damage to heart, brain, nerves, lungs, other parts of body or loss of life  Informed patient about role of CRNA in peri- and intra-operative care.  Patient voiced understanding.)        Anesthesia Quick Evaluation

## 2024-04-21 NOTE — Transfer of Care (Signed)
 Immediate Anesthesia Transfer of Care Note  Patient: Brendan [name]  Procedure(s) Performed: COLONOSCOPY  Patient Location: PACU  Anesthesia Type:General  Level of Consciousness: awake, alert , oriented, and patient cooperative  Airway & Oxygen  Therapy: Patient Spontanous Breathing and Patient connected to nasal cannula oxygen   Post-op Assessment: Report given to RN, Post -op Vital signs reviewed and stable, and Patient moving all extremities X 4  Post vital signs: Reviewed and stable  Last Vitals:  Vitals Value Taken Time  BP 109/71 04/21/24 1117  Temp 35.9 C 04/21/24 1116  Pulse 97 04/21/24 1121  Resp 17 04/21/24 1121  SpO2 100 % 04/21/24 1121  Vitals shown include unfiled device data.  Last Pain:  Vitals:   04/21/24 1116  TempSrc: Temporal  PainSc: 0-No pain       Pt awake, responsive and comfortable. VSS.  Complications: No notable events documented.

## 2024-04-21 NOTE — Anesthesia Postprocedure Evaluation (Signed)
 Anesthesia Post Note  Patient: Brendan [name]  Procedure(s) Performed: COLONOSCOPY  Patient location during evaluation: PACU Anesthesia Type: General Level of consciousness: awake and alert, oriented and patient cooperative Pain management: pain level controlled Vital Signs Assessment: post-procedure vital signs reviewed and stable Respiratory status: spontaneous breathing, nonlabored ventilation and respiratory function stable Cardiovascular status: blood pressure returned to baseline and stable Postop Assessment: adequate PO intake Anesthetic complications: no   No notable events documented.   Last Vitals:  Vitals:   04/21/24 1116 04/21/24 1129  BP: 109/71 124/72  Pulse: (!) 102 95  Resp: 15 15  Temp: (!) 35.9 C   SpO2: 99% 96%    Last Pain:  Vitals:   04/21/24 1129  TempSrc:   PainSc: 0-No pain                 Dorothey Gate

## 2024-04-22 ENCOUNTER — Encounter: Payer: Self-pay | Admitting: Gastroenterology

## 2024-04-22 LAB — SURGICAL PATHOLOGY

## 2024-05-26 ENCOUNTER — Other Ambulatory Visit: Payer: Self-pay | Admitting: Nurse Practitioner

## 2024-05-29 NOTE — Telephone Encounter (Signed)
 Requested medications are due for refill today.  yes  Requested medications are on the active medications list.  yes  Last refill. 02/25/2024 #90 0 rf  Future visit scheduled.   yes  Notes to clinic.  Labs are expired.    Requested Prescriptions  Pending Prescriptions Disp Refills   tamsulosin  (FLOMAX ) 0.4 MG CAPS capsule [Pharmacy Med Name: TAMSULOSIN  HCL 0.4 MG CAPSULE] 90 capsule 0    Sig: Take 1 capsule (0.4 mg total) by mouth daily.     Urology: Alpha-Adrenergic Blocker Failed - 05/29/2024  9:32 AM      Failed - PSA in normal range and within 360 days    PSA  Date Value Ref Range Status  05/17/2015 from [facility]  Final   Prostate Specific Ag, Serum  Date Value Ref Range Status  04/15/2023 0.4 0.0 - 4.0 ng/mL Final    Comment:    Roche ECLIA methodology. According to the American Urological Association, Serum PSA should decrease and remain at undetectable levels after radical prostatectomy. The AUA defines biochemical recurrence as an initial PSA value 0.2 ng/mL or greater followed by a subsequent confirmatory PSA value 0.2 ng/mL or greater. Values obtained with different assay methods or kits cannot be used interchangeably. Results cannot be interpreted as absolute evidence of the presence or absence of malignant disease.          Passed - Last BP in normal range    BP Readings from Last 1 Encounters:  04/21/24 124/72         Passed - Valid encounter within last 12 months    Recent Outpatient Visits           1 month ago Controlled type 2 diabetes with neuropathy [facility])   [place] [facility] [place], Lavelle Posey, NP

## 2024-06-08 ENCOUNTER — Other Ambulatory Visit: Payer: Self-pay | Admitting: Podiatry

## 2024-06-08 ENCOUNTER — Other Ambulatory Visit: Payer: Self-pay | Admitting: Nurse Practitioner

## 2024-06-09 NOTE — Telephone Encounter (Signed)
 Requested Prescriptions  Pending Prescriptions Disp Refills   chlorthalidone  (HYGROTON ) 25 MG tablet [Pharmacy Med Name: CHLORTHALIDONE  25 MG TABLET] 90 tablet 0    Sig: Take 1 tablet (25 mg total) by mouth daily.     Cardiovascular: Diuretics - Thiazide Failed - 06/09/2024  1:57 PM      Failed - Cr in normal range and within 180 days    Creatinine, Ser  Date Value Ref Range Status  04/14/2024 1.40 (H) 0.76 - 1.27 mg/dL Final         Failed - Na in normal range and within 180 days    Sodium  Date Value Ref Range Status  04/14/2024 145 (H) 134 - 144 mmol/L Final         Passed - K in normal range and within 180 days    Potassium  Date Value Ref Range Status  04/14/2024 4.4 3.5 - 5.2 mmol/L Final         Passed - Last BP in normal range    BP Readings from Last 1 Encounters:  04/21/24 124/72         Passed - Valid encounter within last 6 months    Recent Outpatient Visits           1 month ago Controlled type 2 diabetes with neuropathy (HCC)   [facility] [place], [name], NP               metFORMIN  (GLUCOPHAGE ) 1000 MG tablet [Pharmacy Med Name: METFORMIN  HCL 1,000 MG TABLET] 180 tablet 0    Sig: Take 1 tablet (1,000 mg total) by mouth 2 (two) times daily with a meal.     Endocrinology:  Diabetes - Biguanides Failed - 06/09/2024  1:57 PM      Failed - Cr in normal range and within 360 days    Creatinine, Ser  Date Value Ref Range Status  04/14/2024 1.40 (H) 0.76 - 1.27 mg/dL Final         Failed - eGFR in normal range and within 360 days    GFR calc Af Amer  Date Value Ref Range Status  08/23/2020 71 >59 mL/min/1.73 Final    Comment:    **[facility] currently reports eGFR in compliance with the current**   recommendations of the [employer]. [facility] will   update reporting as new guidelines are published from the NKF-ASN   Task force.    GFR, Estimated  Date Value Ref Range Status  05/28/2023 >60 >60 mL/min Final     Comment:    (NOTE) Calculated using the CKD-EPI Creatinine Equation (2021)    eGFR  Date Value Ref Range Status  04/14/2024 53 (L) >59 mL/min/1.73 Final         Passed - HBA1C is between 0 and 7.9 and within 180 days    Hemoglobin A1C  Date Value Ref Range Status  09/17/2016 5.9%  Final   HB A1C (BAYER DCA - WAIVED)  Date Value Ref Range Status  04/14/2024 6.7 (H) 4.8 - 5.6 % Final    Comment:             Prediabetes: 5.7 - 6.4          Diabetes: >6.4          Glycemic control for adults with diabetes: <7.0          Passed - B12 Level in normal range and within 720 days    Vitamin B-12  Date  Value Ref Range Status  01/14/2024 790 232 - 1,245 pg/mL Final         Passed - Valid encounter within last 6 months    Recent Outpatient Visits           1 month ago Controlled type 2 diabetes with neuropathy (HCC)   [place] [facility] [place], [place]view T, NP              Passed - CBC within normal limits and completed in the last 12 months    WBC  Date Value Ref Range Status  01/14/2024 8.3 3.4 - 10.8 x10E3/uL Final  05/28/2023 7.3 4.0 - 10.5 K/uL Final   RBC  Date Value Ref Range Status  01/14/2024 5.45 4.14 - 5.80 x10E6/uL Final  05/28/2023 5.65 4.22 - 5.81 MIL/uL Final   Hemoglobin  Date Value Ref Range Status  01/14/2024 16.6 13.0 - 17.7 g/dL Final   Total hemoglobin  Date Value Ref Range Status  07/02/2017 9.7 (L) 12.0 - 16.0 g/dL Final   Hematocrit  Date Value Ref Range Status  01/14/2024 49.3 37.5 - 51.0 % Final   MCHC  Date Value Ref Range Status  01/14/2024 33.7 31.5 - 35.7 g/dL Final  81/19/1478 29.5 30.0 - 36.0 g/dL Final   [place]  Date Value Ref Range Status  01/14/2024 30.5 26.6 - 33.0 pg Final  05/28/2023 29.9 26.0 - 34.0 pg Final   MCV  Date Value Ref Range Status  01/14/2024 91 79 - 97 fL Final   No results found for: "PLTCOUNTKUC", "LABPLAT", "POCPLA" RDW  Date Value Ref Range Status  01/14/2024 13.7 11.6 - 15.4 %  Final

## 2024-06-15 ENCOUNTER — Encounter: Payer: Self-pay | Admitting: Podiatry

## 2024-06-15 ENCOUNTER — Ambulatory Visit (INDEPENDENT_AMBULATORY_CARE_PROVIDER_SITE_OTHER): Admitting: Podiatry

## 2024-06-15 DIAGNOSIS — B351 Tinea unguium: Secondary | ICD-10-CM

## 2024-06-15 DIAGNOSIS — E1142 Type 2 diabetes mellitus with diabetic polyneuropathy: Secondary | ICD-10-CM | POA: Diagnosis not present

## 2024-06-15 DIAGNOSIS — I739 Peripheral vascular disease, unspecified: Secondary | ICD-10-CM

## 2024-06-15 DIAGNOSIS — M79676 Pain in unspecified toe(s): Secondary | ICD-10-CM

## 2024-06-15 NOTE — Progress Notes (Signed)
 He presents today for routine nail debridement however he does go on to say that he has noticed that he has developed a lot of weakness in his right lower extremity.  Going as far to say that he has lost muscle mass in the right lower extremity because of his weakness.  He states that he feels very uncomfortable and very restless at all times.  He also complains of swelling bilaterally right greater than left.  Objective: Vital signs stable alert and oriented x 3 pulses are nonpalpable bilateral but his feet are warm to the touch.  There is minimal edema to the bilateral foot at this time.  Toenails are long thick yellow dystrophic clinically mycotic.  Assessment: Peripheral vascular disease/peripheral arterial disease pain limb secondary to onychomycosis.  Plan: Debridement of toenails 1 through 5 bilateral covered service secondary pain referred to vascular for evaluation.

## 2024-07-12 NOTE — Patient Instructions (Signed)
Be Involved in Caring For Your Health:  Taking Medications When medications are taken as directed, they can greatly improve your health. But if they are not taken as prescribed, they may not work. In some cases, not taking them correctly can be harmful. To help ensure your treatment remains effective and safe, understand your medications and how to take them. Bring your medications to each visit for review by your provider.  Your lab results, notes, and after visit summary will be available on My Chart. We strongly encourage you to use this feature. If lab results are abnormal the clinic will contact you with the appropriate steps. If the clinic does not contact you assume the results are satisfactory. You can always view your results on My Chart. If you have questions regarding your health or results, please contact the clinic during office hours. You can also ask questions on My Chart.  We at [facility] are grateful that you chose [place] to provide your care. We strive to provide evidence-based and compassionate care and are always looking for feedback. If you get a survey from the clinic please complete this so we can hear your opinions.  Diabetes Mellitus and Exercise Regular exercise is important for your health, especially if you have diabetes mellitus. Exercise is not just about losing weight. It can also help you increase muscle strength and bone density and reduce body fat and stress. This can help your level of endurance and make you more fit and flexible. Why should I exercise if I have diabetes? Exercise has many benefits for people with diabetes. It can: Help lower and control your blood sugar (glucose). Help your body respond better and become more sensitive to the hormone insulin. Reduce how much insulin your body needs. Lower your risk for heart disease by: Lowering how much "bad" cholesterol and triglycerides you have in your body. Increasing how much "good" cholesterol  you have in your body. Lowering your blood pressure. Lowering your blood glucose levels. What is my activity plan? Your health care provider or an expert trained in diabetes care (certified diabetes educator) can help you make an activity plan. This plan can help you find the type of exercise that works for you. It may also tell you how often to exercise and for how long. Be sure to: Get at least 150 minutes of medium-intensity or high-intensity exercise each week. This may involve brisk walking, biking, or water aerobics. Do stretching and strengthening exercises at least 2 times a week. This may involve yoga or weight lifting. Spread out your activity over at least 3 days of the week. Get some form of physical activity each day. Do not go more than 2 days in a row without some kind of activity. Avoid being inactive for more than 30 minutes at a time. Take frequent breaks to walk or stretch. Choose activities that you enjoy. Set goals that you know you can accomplish. Start slowly and increase the intensity of your exercise over time. How do I manage my diabetes during exercise?  Monitor your blood glucose Check your blood glucose before and after you exercise. If your blood glucose is 240 mg/dL (40.9 mmol/L) or higher before you exercise, check your urine for ketones. These are chemicals created by the liver. If you have ketones in your urine, do not exercise until your blood glucose returns to normal. If your blood glucose is 100 mg/dL (5.6 mmol/L) or lower, eat a snack that has 15-20 grams of carbohydrate in  it. Check your blood glucose 15 minutes after the snack to make sure that your level is above 100 mg/dL (5.6 mmol/L) before you start to exercise. Your risk for low blood glucose (hypoglycemia) goes up during and after exercise. Know the symptoms of this condition and how to treat it. Follow these instructions at home: Keep a carbohydrate snack on hand for use before, during, and after  exercise. This can help prevent or treat hypoglycemia. Avoid injecting insulin into parts of your body that are going to be used during exercise. This may include: Your arms, when you are going to play tennis. Your legs, when you are about to go jogging. Keep track of your exercise habits. This can help you and your health care provider watch and adjust your activity plan. Write down: What you eat before and after you exercise. Blood glucose levels before and after you exercise. The type and amount of exercise you do. Talk to your health care provider before you start a new activity. They may need to: Make sure that the activity is safe for you. Adjust your insulin, other medicines, and food that you eat. Drink water while you exercise. This can stop you from losing too much water (dehydration). It can also prevent problems caused by having a lot of heat in your body (heat stroke). Where to find more information American Diabetes Association: diabetes.org Association of Diabetes Care & Education Specialists: diabeteseducator.org This information is not intended to replace advice given to you by your health care provider. Make sure you discuss any questions you have with your health care provider. Document Revised: 06/06/2022 Document Reviewed: 06/06/2022 Elsevier Patient Education  2024 [employer].

## 2024-07-15 ENCOUNTER — Encounter: Payer: Self-pay | Admitting: Nurse Practitioner

## 2024-07-15 ENCOUNTER — Ambulatory Visit (INDEPENDENT_AMBULATORY_CARE_PROVIDER_SITE_OTHER): Admitting: Nurse Practitioner

## 2024-07-15 VITALS — BP 130/74 | HR 76 | Temp 98.1°F | Ht 74.8 in | Wt 197.8 lb

## 2024-07-15 DIAGNOSIS — Z7984 Long term (current) use of oral hypoglycemic drugs: Secondary | ICD-10-CM

## 2024-07-15 DIAGNOSIS — E1159 Type 2 diabetes mellitus with other circulatory complications: Secondary | ICD-10-CM | POA: Diagnosis not present

## 2024-07-15 DIAGNOSIS — E1140 Type 2 diabetes mellitus with diabetic neuropathy, unspecified: Secondary | ICD-10-CM | POA: Diagnosis not present

## 2024-07-15 DIAGNOSIS — I77810 Thoracic aortic ectasia: Secondary | ICD-10-CM

## 2024-07-15 DIAGNOSIS — E119 Type 2 diabetes mellitus without complications: Secondary | ICD-10-CM

## 2024-07-15 DIAGNOSIS — K760 Fatty (change of) liver, not elsewhere classified: Secondary | ICD-10-CM

## 2024-07-15 DIAGNOSIS — I5032 Chronic diastolic (congestive) heart failure: Secondary | ICD-10-CM

## 2024-07-15 DIAGNOSIS — E279 Disorder of adrenal gland, unspecified: Secondary | ICD-10-CM

## 2024-07-15 DIAGNOSIS — E1169 Type 2 diabetes mellitus with other specified complication: Secondary | ICD-10-CM | POA: Diagnosis not present

## 2024-07-15 DIAGNOSIS — I700 Atherosclerosis of aorta: Secondary | ICD-10-CM

## 2024-07-15 DIAGNOSIS — J432 Centrilobular emphysema: Secondary | ICD-10-CM

## 2024-07-15 DIAGNOSIS — I739 Peripheral vascular disease, unspecified: Secondary | ICD-10-CM

## 2024-07-15 LAB — BAYER DCA HB A1C WAIVED: HB A1C (BAYER DCA - WAIVED): 6.7 % — ABNORMAL HIGH (ref 4.8–5.6)

## 2024-07-15 MED ORDER — METFORMIN HCL 1000 MG PO TABS: 1000.0000 mg | ORAL_TABLET | Freq: Two times a day (BID) | ORAL | 3 refills | Status: AC

## 2024-07-15 MED ORDER — METOPROLOL TARTRATE 25 MG PO TABS: 12.5000 mg | ORAL_TABLET | Freq: Two times a day (BID) | ORAL | 3 refills | Status: AC

## 2024-07-15 MED ORDER — GABAPENTIN 300 MG PO CAPS: ORAL_CAPSULE | ORAL | 3 refills | Status: DC

## 2024-07-15 NOTE — Assessment & Plan Note (Signed)
 Chronic, ongoing.  A1c 6.7% today, trend down from 6.8%. Urine ALB 10 January 2024. Continue Metformin , Farxiga , and Januvia .  Adjust Metformin  or Januvia  based on kidney function as needed.  Current CrCl 59 and eGFR 53.  Recommend heavy focus on diet with less pasta and potatoes.  Continue Gabapentin  as prescribed by podiatry and adjust as needed based on renal function.  Check BS BID and document for next visit.   - Eye and foot exams up to date - Vaccinations up to date - Statin on board, allergic to ACE

## 2024-07-15 NOTE — Assessment & Plan Note (Signed)
Followed by cardiology, continue this collaboration.  Recent notes and testing reviewed.

## 2024-07-15 NOTE — Assessment & Plan Note (Signed)
Chronic, stable.  Euvolemic on exam today.  Continue collaboration with cardiology and current medication regimen as ordered by them, recent notes and testing reviewed.  On Farxiga for prevention.  Recommend: - Reminded to call for an overnight weight gain of >2 pounds or a weekly weight weight of >5 pounds - not adding salt to his food and has been reading food labels. Reviewed the importance of keeping daily sodium intake to 2000mg  daily  - Avoid Ibuprofen medications

## 2024-07-15 NOTE — Assessment & Plan Note (Signed)
 Chronic, stable.  April 2024 - FEV1 54% and FEV1/FVC 59%, change from in 2022 was FEV1 64% and FEV1/FVC 73%.  Continue Albuterol  and recommend he use this if wheezing present or SOB.  Continue cessation of smoking and continue yearly lung screening.  Spirometry annually.  Have recommend a daily inhaler for maintenance and to help lung function, he refuses. Due for annual lung screening in August.

## 2024-07-15 NOTE — Assessment & Plan Note (Signed)
Chronic.  Noted on lung screening since July 2021 -- recommend focus on healthy diet and will continue to monitor.  CMP today.

## 2024-07-15 NOTE — Assessment & Plan Note (Signed)
Left side, noted on CT lung screening, stable on recent check = possible adenoma reported.  Continue to monitor on screenings and send to nephrology as needed. °

## 2024-07-15 NOTE — Assessment & Plan Note (Signed)
 Chronic, stable.  BP at goal in office and on home readings.  Continue current medication regimen and adjust as needed.  Continue collaboration with cardiology, recent notes reviewed.  Recommend he monitor BP at least a few times a week at home and document.  DASH diet focus.  LABS: CMP.  Urine ALB 10 January 2024

## 2024-07-15 NOTE — Assessment & Plan Note (Signed)
 Refer to diabetes with neuropathy plan of care

## 2024-07-15 NOTE — Assessment & Plan Note (Signed)
Chronic, ongoing.  Continue current medication regimen and adjust as needed.  Lipid panel today.  Recent LDL <70.

## 2024-07-15 NOTE — Assessment & Plan Note (Signed)
 Chronic, stable.  Continue statin and ASA daily at home for prevention + collaboration with podiatry. Scheduled to see vascular upcoming.

## 2024-07-15 NOTE — Progress Notes (Signed)
 BP 130/74 (BP Location: Left Arm, Patient Position: Sitting)   Pulse 76   Temp 98.1 F (36.7 C) (Oral)   Ht 6' 2.8 (1.9 m)   Wt 197 lb 12.8 oz (89.7 kg)   SpO2 96%   BMI 24.86 kg/m    Subjective:    Patient ID: Brendan [name], male    DOB: Sep 20, 1951, 73 y.o.   MRN: [id number]  HPI: Brendan [name] is a 73 y.o. male   Chief Complaint  Patient presents with   Diabetes   Hyperlipidemia   Hypertension   DIABETES April A1c 6.7%. Continues Metformin  1000 MG BID, Januvia  100 MG daily, and Farxiga  10 MG daily.  For neuropathy takes Gabapentin  300 MG in morning and 600 MG at bedtime + B12 for neuropathy. Sees vascular the 28th to check on PVD. Hypoglycemic episodes:no Polydipsia/polyuria: no Visual disturbance: no Chest pain: no Paresthesias: no Glucose Monitoring: yes  Accucheck frequency: Daily  Fasting glucose:  Post prandial: after lunch, 104 today -- varies 90 to 130  Evening:  Before meals: Taking Insulin ?: no  Long acting insulin :  Short acting insulin : Blood Pressure Monitoring: a few times a week Retinal Examination: Up to Date Foot Exam: Up to Date Diabetic Education: Completed Pneumovax: Up to Date Influenza: Up to Date Aspirin : yes   HYPERTENSION / HYPERLIPIDEMIA Continues Metoprolol , Hygroton , Amlodipine , and Lipitor. Has never used NTG.  Allergic to Lisinopril .    Followed by cardiology, PA Dunn, with last visit 06/17/23, had stress test and Myo -- EF 66%. Satisfied with current treatment? yes Duration of hypertension: chronic BP monitoring frequency: a few times a week BP range: 117-125/70-80 BP medication side effects: no Duration of hyperlipidemia: chronic Cholesterol medication side effects: no Cholesterol supplements: none Medication compliance: good compliance Aspirin : yes Recent stressors: no Recurrent headaches: no Visual changes: no Palpitations: no Dyspnea: no Chest pain: no Lower extremity edema: every once and awhile Dizzy/lightheaded:  no   COPD Continues Singulair  and Albuterol . Lung cancer screening 08/09/23 -- noted aortic atherosclerosis + emphysema.  Past imaging showed hepatic steatosis + left adrenal nodule which is stable at 1.8 cm -- not noted on recent scan.     Has not smoked in > 7 years since bypass surgery. COPD status: stable Satisfied with current treatment?: yes Oxygen  use: no Limitation of activity: no Pneumovax: Up to Date Influenza: Up to Date      07/15/2024    2:40 PM 04/14/2024    2:02 PM 01/14/2024    2:27 PM 11/26/2023    3:45 PM 10/14/2023    1:55 PM  Depression screen PHQ 2/9  Decreased Interest 0 0 0 0 0  Down, Depressed, Hopeless 0 0 0 0 0  PHQ - 2 Score 0 0 0 0 0  Altered sleeping 0 0 0 0 0  Tired, decreased energy 1 0 0 0 0  Change in appetite 0 0 0 0 0  Feeling bad or failure about yourself  0 0 0 0 0  Trouble concentrating 0 0 0 0 0  Moving slowly or fidgety/restless 0 0 0 0 0  Suicidal thoughts 0 0 0 0 0  PHQ-9 Score 1 0 0 0 0  Difficult doing work/chores Not difficult at all Not difficult at all Not difficult at all Not difficult at all        07/15/2024    2:41 PM 04/14/2024    2:02 PM 01/14/2024    2:28 PM 10/14/2023    1:55 PM  GAD  7 : Generalized Anxiety Score  Nervous, Anxious, on Edge 0 0 0 0  Control/stop worrying 0 0 0 0  Worry too much - different things 1 0 0 0  Trouble relaxing 0 0 0 0  Restless 0 0 0 0  Easily annoyed or irritable 0 0 0 0  Afraid - awful might happen 0 0 0 0  Total GAD 7 Score 1 0 0 0  Anxiety Difficulty Not difficult at all Not difficult at all Not difficult at all    Relevant past medical, surgical, family and social history reviewed and updated as indicated. Interim medical history since our last visit reviewed. Allergies and medications reviewed and updated.  Review of Systems  Constitutional:  Negative for activity change, diaphoresis, fatigue and fever.  Respiratory:  Negative for cough, chest tightness, shortness of breath and  wheezing.   Cardiovascular:  Negative for chest pain, palpitations and leg swelling.  Gastrointestinal: Negative.   Endocrine: Negative for cold intolerance, heat intolerance, polydipsia, polyphagia and polyuria.  Neurological: Negative.   Psychiatric/Behavioral: Negative.     Per HPI unless specifically indicated above     Objective:    BP 130/74 (BP Location: Left Arm, Patient Position: Sitting)   Pulse 76   Temp 98.1 F (36.7 C) (Oral)   Ht 6' 2.8 (1.9 m)   Wt 197 lb 12.8 oz (89.7 kg)   SpO2 96%   BMI 24.86 kg/m   Wt Readings from Last 3 Encounters:  07/15/24 197 lb 12.8 oz (89.7 kg)  04/21/24 190 lb 9.6 oz (86.5 kg)  04/14/24 196 lb 9.6 oz (89.2 kg)    Physical Exam Vitals and nursing note reviewed.  Constitutional:      General: He is awake. He is not in acute distress.    Appearance: Normal appearance. He is well-developed and well-groomed. He is not ill-appearing or toxic-appearing.  HENT:     Head: Normocephalic.     Right Ear: Hearing and external ear normal.     Left Ear: Hearing and external ear normal.  Eyes:     General: Lids are normal.     Extraocular Movements: Extraocular movements intact.     Conjunctiva/sclera: Conjunctivae normal.  Neck:     Thyroid : No thyromegaly.     Vascular: No carotid bruit.  Cardiovascular:     Rate and Rhythm: Normal rate and regular rhythm.     Heart sounds: Normal heart sounds. No murmur heard.    No gallop.  Pulmonary:     Effort: No accessory muscle usage or respiratory distress.     Breath sounds: Normal breath sounds.  Abdominal:     General: Bowel sounds are normal. There is no distension.     Palpations: Abdomen is soft.     Tenderness: There is no abdominal tenderness.  Musculoskeletal:     Cervical back: Full passive range of motion without pain.     Right lower leg: No edema.     Left lower leg: No edema.  Lymphadenopathy:     Cervical: No cervical adenopathy.  Skin:    General: Skin is warm.      Capillary Refill: Capillary refill takes less than 2 seconds.  Neurological:     Mental Status: He is alert and oriented to person, place, and time.     Deep Tendon Reflexes: Reflexes are normal and symmetric.     Reflex Scores:      Brachioradialis reflexes are 2+ on the right side and 2+ on  the left side.      Patellar reflexes are 2+ on the right side and 2+ on the left side. Psychiatric:        Attention and Perception: Attention normal.        Mood and Affect: Mood normal.        Speech: Speech normal.        Behavior: Behavior normal. Behavior is cooperative.        Thought Content: Thought content normal.    Results for orders placed or performed during the hospital encounter of 04/21/24  Surgical pathology   Collection Time: 04/21/24 12:00 AM  Result Value Ref Range   SURGICAL PATHOLOGY      SURGICAL PATHOLOGY [facility] [address] [place], [place] [address] Telephone [phone] or [phone] Fax [phone]  REPORT OF SURGICAL PATHOLOGY   Accession #: [id number] Patient Name: KHAIRI, GARMAN Visit # : [id number]  MRN: [id number] Physician: Jinny Carmine DOB/Age July 31, 1951 (Age: 89) Gender: M Collected Date: 04/21/2024 Received Date: 04/21/2024  FINAL DIAGNOSIS       1. Cecum Polyp, cold snare :       TUBULAR ADENOMA (MULTIPLE FRAGMENTS) WITHOUT HIGH GRADE DYSPLASIA.       2. Sigmoid  Colon Polyp, x2 cold snare :       TUBULAR ADENOMA (MULTIPLE FRAGMENTS) WITHOUT HIGH GRADE DYSPLASIA.       ELECTRONIC SIGNATURE : Belvie Come, John, Pathologist, Electronic Signature  MICROSCOPIC DESCRIPTION  CASE COMMENTS STAINS USED IN DIAGNOSIS: H&E H&E    CLINICAL HISTORY  SPECIMEN(S) OBTAINED 1. Cecum Polyp, Cold Snare 2. Sigmoid  Colon Polyp, X2 Cold Snare  SPECIMEN COMMENTS: SPECIMEN CLINICAL INFORMAT ION: 1. Screening for colon cancer, diverticulosis, colon polyps    Gross Description 1. Cold snare polyp  cecum, received in formalin is a 2.0 x 0.6 x 0.1 cm aggregate of multiple tan-gray tissue fragments.The specimen is submitted in toto in 1 block (1A). 2. Cold snare polyp x2 sigmoid colon, received in formalin is a 2.0 x 1.5 x 0.1 cm aggregate of multiple tan-gray tissue fragments.The specimen is submitted in toto in 1 block (2A).      [facility] 04/21/2024        Report signed out from the following location(s) [place].  [facility] [address] [place], [place] [address] CLIA #: [id number]  [facility] [address] [place], [place] [address] CLIA #: [id number]   Glucose, capillary   Collection Time: 04/21/24  9:49 AM  Result Value Ref Range   Glucose-Capillary 143 (H) 70 - 99 mg/dL      Assessment & Plan:   Problem List Items Addressed This Visit       Cardiovascular and Mediastinum   Peripheral vascular disease (HCC) (Chronic)   Chronic, stable.  Continue statin and ASA daily at home for prevention + collaboration with podiatry. Scheduled to see vascular upcoming.      Relevant Medications   metoprolol  tartrate (LOPRESSOR ) 25 MG tablet   Hypertension associated with diabetes (HCC) (Chronic)   Chronic, stable.  BP at goal in office and on home readings.  Continue current medication regimen and adjust as needed.  Continue collaboration with cardiology, recent notes reviewed.  Recommend he monitor BP at least a few times a week at home and document.  DASH diet focus.  LABS: CMP.  Urine ALB 10 January 2024      Relevant Medications   metFORMIN  (GLUCOPHAGE ) 1000 MG tablet  metoprolol  tartrate (LOPRESSOR ) 25 MG tablet   Other Relevant Orders   Bayer DCA Hb A1c Waived   Dilated aortic root (HCC) (Chronic)   Followed by cardiology, continue this collaboration.  Recent notes and testing reviewed.      Relevant Medications   metoprolol  tartrate (LOPRESSOR ) 25 MG tablet   Chronic heart failure with preserved ejection fraction (HFpEF) (HCC)  (Chronic)   Chronic, stable.  Euvolemic on exam today.  Continue collaboration with cardiology and current medication regimen as ordered by them, recent notes and testing reviewed.  On Farxiga  for prevention.  Recommend: - Reminded to call for an overnight weight gain of >2 pounds or a weekly weight weight of >5 pounds - not adding salt to his food and has been reading food labels. Reviewed the importance of keeping daily sodium intake to 2000mg  daily  - Avoid Ibuprofen medications      Relevant Medications   metoprolol  tartrate (LOPRESSOR ) 25 MG tablet   Aortic atherosclerosis (HCC) (Chronic)   Chronic.  Noted on CT imaging.  Continue ASA and Lipitor daily for prevention.  Quit smoking >5 years ago.        Relevant Medications   metoprolol  tartrate (LOPRESSOR ) 25 MG tablet     Respiratory   Centrilobular emphysema (HCC) (Chronic)   Chronic, stable.  April 2024 - FEV1 54% and FEV1/FVC 59%, change from in 2022 was FEV1 64% and FEV1/FVC 73%.  Continue Albuterol  and recommend he use this if wheezing present or SOB.  Continue cessation of smoking and continue yearly lung screening.  Spirometry annually.  Have recommend a daily inhaler for maintenance and to help lung function, he refuses. Due for annual lung screening in August.        Digestive   Hepatic steatosis   Chronic.  Noted on lung screening since July 2021 -- recommend focus on healthy diet and will continue to monitor.  CMP today.        Endocrine   Hyperlipidemia associated with type 2 diabetes mellitus (HCC) (Chronic)   Chronic, ongoing.  Continue current medication regimen and adjust as needed.  Lipid panel today.  Recent LDL <70.      Relevant Medications   metFORMIN  (GLUCOPHAGE ) 1000 MG tablet   metoprolol  tartrate (LOPRESSOR ) 25 MG tablet   Other Relevant Orders   Bayer DCA Hb A1c Waived   Comprehensive metabolic panel with GFR   Lipid Panel w/o Chol/HDL Ratio   Controlled type 2 diabetes with neuropathy (HCC) -  Primary (Chronic)   Chronic, ongoing.  A1c 6.7% today, trend down from 6.8%. Urine ALB 10 January 2024. Continue Metformin , Farxiga , and Januvia .  Adjust Metformin  or Januvia  based on kidney function as needed.  Current CrCl 59 and eGFR 53.  Recommend heavy focus on diet with less pasta and potatoes.  Continue Gabapentin  as prescribed by podiatry and adjust as needed based on renal function.  Check BS BID and document for next visit.   - Eye and foot exams up to date - Vaccinations up to date - Statin on board, allergic to ACE      Relevant Medications   metFORMIN  (GLUCOPHAGE ) 1000 MG tablet   Other Relevant Orders   Bayer DCA Hb A1c Waived   Diabetes mellitus treated with oral medication (HCC)   Refer to diabetes with neuropathy plan of care.      Relevant Medications   metFORMIN  (GLUCOPHAGE ) 1000 MG tablet   Other Relevant Orders   Bayer DCA Hb A1c Waived  Other   Adrenal nodule (HCC) (Chronic)   Left side, noted on CT lung screening, stable on recent check = possible adenoma reported.  Continue to monitor on screenings and send to nephrology as needed.        Follow up plan: Return in about 3 months (around 10/15/2024) for T2DM, HTN/HLD, HF, COPD.

## 2024-07-15 NOTE — Assessment & Plan Note (Signed)
Chronic.  Noted on CT imaging.  Continue ASA and Lipitor daily for prevention.  Quit smoking >5 years ago.

## 2024-07-16 ENCOUNTER — Ambulatory Visit: Payer: Self-pay | Admitting: Nurse Practitioner

## 2024-07-16 LAB — COMPREHENSIVE METABOLIC PANEL WITH GFR
ALT: 18 IU/L (ref 0–44)
AST: 15 IU/L (ref 0–40)
Albumin: 4.8 g/dL (ref 3.8–4.8)
Alkaline Phosphatase: 65 IU/L (ref 44–121)
BUN/Creatinine Ratio: 16 (ref 10–24)
BUN: 24 mg/dL (ref 8–27)
Bilirubin Total: 0.3 mg/dL (ref 0.0–1.2)
CO2: 20 mmol/L (ref 20–29)
Calcium: 9.7 mg/dL (ref 8.6–10.2)
Chloride: 104 mmol/L (ref 96–106)
Creatinine, Ser: 1.53 mg/dL — ABNORMAL HIGH (ref 0.76–1.27)
Globulin, Total: 1.7 g/dL (ref 1.5–4.5)
Glucose: 102 mg/dL — ABNORMAL HIGH (ref 70–99)
Potassium: 4.1 mmol/L (ref 3.5–5.2)
Sodium: 144 mmol/L (ref 134–144)
Total Protein: 6.5 g/dL (ref 6.0–8.5)
eGFR: 48 mL/min/1.73 — ABNORMAL LOW

## 2024-07-16 LAB — LIPID PANEL W/O CHOL/HDL RATIO
Cholesterol, Total: 98 mg/dL — ABNORMAL LOW (ref 100–199)
HDL: 29 mg/dL — ABNORMAL LOW (ref >39)
LDL Chol Calc (NIH): 50 mg/dL (ref 0–99)
Triglycerides: 98 mg/dL (ref 0–149)
VLDL Cholesterol Cal: 19 mg/dL (ref 5–40)

## 2024-07-16 NOTE — Progress Notes (Signed)
 Good morning, please let [name] know labs have returned and remain at baseline for him.  No changes needed.  Have a wonderful day!! Keep being amazing!!  Thank you for allowing me to participate in your care.  I appreciate you. Kindest regards, Lacosta Hargan

## 2024-07-27 ENCOUNTER — Ambulatory Visit: Attending: Podiatry

## 2024-07-27 DIAGNOSIS — I739 Peripheral vascular disease, unspecified: Secondary | ICD-10-CM | POA: Diagnosis not present

## 2024-07-27 DIAGNOSIS — E1142 Type 2 diabetes mellitus with diabetic polyneuropathy: Secondary | ICD-10-CM

## 2024-07-28 ENCOUNTER — Other Ambulatory Visit: Payer: Self-pay | Admitting: Nurse Practitioner

## 2024-07-28 ENCOUNTER — Ambulatory Visit: Payer: Self-pay | Admitting: Podiatry

## 2024-07-28 ENCOUNTER — Other Ambulatory Visit: Payer: Self-pay

## 2024-07-28 DIAGNOSIS — I739 Peripheral vascular disease, unspecified: Secondary | ICD-10-CM

## 2024-07-28 LAB — VAS US ABI WITH/WO TBI
Left ABI: 0.58
Right ABI: 0.84

## 2024-07-29 ENCOUNTER — Encounter: Payer: Self-pay | Admitting: Acute Care

## 2024-07-29 NOTE — Telephone Encounter (Signed)
 Requested Prescriptions  Pending Prescriptions Disp Refills   montelukast  (SINGULAIR ) 10 MG tablet [Pharmacy Med Name: MONTELUKAST  SOD 10 MG TABLET] 90 tablet 0    Sig: Take 1 tablet (10 mg total) by mouth at bedtime.     Pulmonology:  Leukotriene Inhibitors Passed - 07/29/2024 11:54 AM      Passed - Valid encounter within last 12 months    Recent Outpatient Visits           2 weeks ago Controlled type 2 diabetes with neuropathy (HCC)   [facility] [place], [place] T, NP   3 months ago Controlled type 2 diabetes with neuropathy [facility])    [facility] Melanie DASEN, NP       Future Appointments             In 2 months End, Lonni, MD [facility]

## 2024-07-30 NOTE — Progress Notes (Signed)
 Tempted to reach out to patient and no answer.

## 2024-07-31 ENCOUNTER — Telehealth: Payer: Self-pay | Admitting: Internal Medicine

## 2024-07-31 NOTE — Telephone Encounter (Signed)
Patient states he was returning call. Please advise ?

## 2024-07-31 NOTE — Progress Notes (Signed)
 Left message on daughters voicemail to contact our office unable to contact patient.

## 2024-08-03 NOTE — Telephone Encounter (Signed)
 Spoke with patient stated has see vascular and will be keeping appointments with them due to blockage will also be having further testing.

## 2024-08-03 NOTE — Telephone Encounter (Signed)
 Tried calling patient again no answer mailbox full.

## 2024-08-10 ENCOUNTER — Ambulatory Visit
Admission: RE | Admit: 2024-08-10 | Discharge: 2024-08-10 | Disposition: A | Discharge status: Home / Self Care | Source: Ambulatory Visit | Attending: Acute Care | Admitting: Acute Care

## 2024-08-10 DIAGNOSIS — Z87891 Personal history of nicotine dependence: Secondary | ICD-10-CM | POA: Diagnosis not present

## 2024-08-10 DIAGNOSIS — Z122 Encounter for screening for malignant neoplasm of respiratory organs: Secondary | ICD-10-CM | POA: Insufficient documentation

## 2024-08-24 ENCOUNTER — Other Ambulatory Visit: Payer: Self-pay | Admitting: Nurse Practitioner

## 2024-08-25 ENCOUNTER — Other Ambulatory Visit: Payer: Self-pay

## 2024-08-25 ENCOUNTER — Telehealth: Payer: Self-pay | Admitting: Internal Medicine

## 2024-08-25 ENCOUNTER — Encounter: Payer: Self-pay | Admitting: Emergency Medicine

## 2024-08-25 DIAGNOSIS — Z122 Encounter for screening for malignant neoplasm of respiratory organs: Secondary | ICD-10-CM

## 2024-08-25 DIAGNOSIS — Z87891 Personal history of nicotine dependence: Secondary | ICD-10-CM

## 2024-08-25 NOTE — Telephone Encounter (Signed)
Patient states he was returning a call. Please advise 

## 2024-08-25 NOTE — Telephone Encounter (Signed)
 Spoke with pt who reports he received a call from the office yesterday or today but doesn't know who from.  He reports he did not have a voicemail or know who called.  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  Advised I will forward this to [facility] for follow up.

## 2024-08-25 NOTE — Telephone Encounter (Signed)
 Results that were mention in previous note today were discussed and letter sent

## 2024-08-25 NOTE — Telephone Encounter (Signed)
 Requested Prescriptions  Pending Prescriptions Disp Refills   amLODipine  (NORVASC ) 5 MG tablet [Pharmacy Med Name: AMLODIPINE  BESYLATE 5 MG TAB] 90 tablet 1    Sig: Take 1 tablet (5 mg total) by mouth daily.     Cardiovascular: Calcium  Channel Blockers 2 Passed - 08/25/2024  5:38 PM      Passed - Last BP in normal range    BP Readings from Last 1 Encounters:  07/15/24 130/74         Passed - Last Heart Rate in normal range    Pulse Readings from Last 1 Encounters:  07/15/24 76         Passed - Valid encounter within last 6 months    Recent Outpatient Visits           1 month ago Controlled type 2 diabetes with neuropathy (HCC)   [facility] [place], [place] T, NP   4 months ago Controlled type 2 diabetes with neuropathy (HCC)   [facility] [facility] [place], Melanie DASEN, NP       Future Appointments             In 1 month End, Lonni, MD [facility]

## 2024-08-25 NOTE — Progress Notes (Signed)
 The patient has been called but no answer and no way to leave voicemail - not set up.  Letter sent to pt to inform of Ryan's CT interpretation:   CT scan forwarded to me by outside office for lung cancer screening.  Imaging shows a stable enlargement of the thoracic aorta measuring 4.1 cm as well as known coronary artery calcification and aortic atherosclerosis.  Patient will need follow-up imaging of the thoracic aorta in 12 months, this can be obtained with is a lung cancer screening CTs if he would like, or we can order a separate study.  Cholesterol is at goal and he is on good cardiac medications.  At his visit in October, based on symptoms, we could consider repeating an echo based off CT findings, this can be discussed at that time.

## 2024-09-07 ENCOUNTER — Other Ambulatory Visit: Payer: Self-pay | Admitting: Nurse Practitioner

## 2024-09-08 NOTE — Telephone Encounter (Signed)
 Requested Prescriptions  Pending Prescriptions Disp Refills   chlorthalidone  (HYGROTON ) 25 MG tablet [Pharmacy Med Name: CHLORTHALIDONE  25 MG TABLET] 90 tablet 0    Sig: Take 1 tablet (25 mg total) by mouth daily.     Cardiovascular: Diuretics - Thiazide Failed - 09/08/2024  5:33 PM      Failed - Cr in normal range and within 180 days    Creatinine, Ser  Date Value Ref Range Status  07/15/2024 1.53 (H) 0.76 - 1.27 mg/dL Final         Passed - K in normal range and within 180 days    Potassium  Date Value Ref Range Status  07/15/2024 4.1 3.5 - 5.2 mmol/L Final         Passed - Na in normal range and within 180 days    Sodium  Date Value Ref Range Status  07/15/2024 144 134 - 144 mmol/L Final         Passed - Last BP in normal range    BP Readings from Last 1 Encounters:  07/15/24 130/74         Passed - Valid encounter within last 6 months    Recent Outpatient Visits           1 month ago Controlled type 2 diabetes with neuropathy (HCC)   [facility] [place], Melanie T, NP   4 months ago Controlled type 2 diabetes with neuropathy (HCC)   [place] [facility] [place], Melanie DASEN, NP       Future Appointments             In 3 weeks End, Lonni, MD [facility]

## 2024-09-10 ENCOUNTER — Other Ambulatory Visit (INDEPENDENT_AMBULATORY_CARE_PROVIDER_SITE_OTHER): Payer: Self-pay | Admitting: Nurse Practitioner

## 2024-09-10 DIAGNOSIS — I739 Peripheral vascular disease, unspecified: Secondary | ICD-10-CM

## 2024-09-10 DIAGNOSIS — R6889 Other general symptoms and signs: Secondary | ICD-10-CM

## 2024-09-15 ENCOUNTER — Telehealth (INDEPENDENT_AMBULATORY_CARE_PROVIDER_SITE_OTHER): Payer: Self-pay | Admitting: Nurse Practitioner

## 2024-09-15 NOTE — Telephone Encounter (Signed)
 Patient left message on the nurse line, he is returning a call. I do see patient have an appointment on Wednesday, 09/16/2024.

## 2024-09-16 ENCOUNTER — Ambulatory Visit: Admitting: Podiatry

## 2024-09-16 ENCOUNTER — Ambulatory Visit (INDEPENDENT_AMBULATORY_CARE_PROVIDER_SITE_OTHER): Admitting: Nurse Practitioner

## 2024-09-16 ENCOUNTER — Encounter (INDEPENDENT_AMBULATORY_CARE_PROVIDER_SITE_OTHER): Payer: Self-pay | Admitting: Nurse Practitioner

## 2024-09-16 ENCOUNTER — Ambulatory Visit (INDEPENDENT_AMBULATORY_CARE_PROVIDER_SITE_OTHER)

## 2024-09-16 VITALS — BP 147/89 | HR 67 | Ht 74.0 in | Wt 202.0 lb

## 2024-09-16 DIAGNOSIS — E785 Hyperlipidemia, unspecified: Secondary | ICD-10-CM

## 2024-09-16 DIAGNOSIS — E1169 Type 2 diabetes mellitus with other specified complication: Secondary | ICD-10-CM

## 2024-09-16 DIAGNOSIS — I739 Peripheral vascular disease, unspecified: Secondary | ICD-10-CM

## 2024-09-16 DIAGNOSIS — I70223 Atherosclerosis of native arteries of extremities with rest pain, bilateral legs: Secondary | ICD-10-CM | POA: Diagnosis not present

## 2024-09-16 DIAGNOSIS — Z7984 Long term (current) use of oral hypoglycemic drugs: Secondary | ICD-10-CM

## 2024-09-16 DIAGNOSIS — R6889 Other general symptoms and signs: Secondary | ICD-10-CM | POA: Diagnosis not present

## 2024-09-16 DIAGNOSIS — E119 Type 2 diabetes mellitus without complications: Secondary | ICD-10-CM | POA: Diagnosis not present

## 2024-09-21 ENCOUNTER — Other Ambulatory Visit: Payer: Self-pay | Admitting: Internal Medicine

## 2024-09-21 ENCOUNTER — Ambulatory Visit: Admitting: Podiatry

## 2024-09-21 DIAGNOSIS — E1142 Type 2 diabetes mellitus with diabetic polyneuropathy: Secondary | ICD-10-CM | POA: Diagnosis not present

## 2024-09-21 DIAGNOSIS — B351 Tinea unguium: Secondary | ICD-10-CM | POA: Diagnosis not present

## 2024-09-21 DIAGNOSIS — M79676 Pain in unspecified toe(s): Secondary | ICD-10-CM | POA: Diagnosis not present

## 2024-09-21 DIAGNOSIS — I739 Peripheral vascular disease, unspecified: Secondary | ICD-10-CM | POA: Diagnosis not present

## 2024-09-21 LAB — VAS US ABI WITH/WO TBI
Left ABI: 0.48
Right ABI: 0.73

## 2024-09-21 NOTE — Progress Notes (Signed)
 Brendan [name] presents today for a follow-up of his painful elongated toenails.  States that he has cut them the other day so they are not too bad.  If you go to straighten them out for me.  He is also waiting for a call from vascular.  He had Dopplers done and stated that he had blockages in both legs that need to have stents placed.  Objective: Vital signs are stable alert oriented x 3.  Pulses are palpable but barely.  Capillary fill time is sluggish and his feet are warm to the touch.  Toenails are slightly elongated jagged.  No skin breakdown.  Assessment: Pain in limb secondary to onychomycosis and peripheral vascular disease.  Plan: Debridement of toenails 1 through 5 bilateral covered service.

## 2024-09-27 ENCOUNTER — Encounter (INDEPENDENT_AMBULATORY_CARE_PROVIDER_SITE_OTHER): Payer: Self-pay | Admitting: Nurse Practitioner

## 2024-09-27 NOTE — H&P (View-Only) (Signed)
 Subjective:    Patient ID: Brendan [name], male    DOB: 06-Jul-1951, 73 y.o.   MRN: [id number] Chief Complaint  Patient presents with   New Patient (Initial Visit)     np. ABI/LEA + consult. PAD  ref: Wyatt,Max       HPI  Review of Systems     Objective:   Physical Exam  BP (!) 147/89   Pulse 67   Ht 6' 2 (1.88 m)   Wt 202 lb (91.6 kg)   BMI 25.94 kg/m   Past Medical History:  Diagnosis Date   CAD (coronary artery disease)    a. 05/2016 Lexi MV: EF 58%, no ischemia/infarct; b. 05/2017 Cath: LM 20-30, LAD 50p, 25m, D1 50p, D2 80ost, LCX 90/70ost, RCA 80/90p/m, RPL1 50p, EF 50-55%; c. 06/2017 CABG x 4 (LIMA->LAD, VG->D2,VG->OM, VG->PDA).   Diabetes mellitus without complication (HCC)    Diastolic dysfunction    a. 05/2016 Echo: EF 55%, Gr1 DD, mild TR/MR.   Hyperlipidemia    Hypertension    Hypokalemia    Personal history of tobacco use, presenting hazards to health 05/03/2016   Post-op Afib    a. 06/2017 following CABG->short course of amio.    Social History   Socioeconomic History   Marital status: Widowed    Spouse name: Not on file   Number of children: 3   Years of education: Not on file   Highest education level: 10th grade  Occupational History   Occupation: works part time as Copy   Tobacco Use   Smoking status: Former    Current packs/day: 0.00    Average packs/day: 1 pack/day for 60.0 years (60.0 ttl pk-yrs)    Types: Cigarettes    Start date: 06/29/1957    Quit date: 06/29/2017    Years since quitting: 7.2   Smokeless tobacco: Never  Vaping Use   Vaping status: Never Used  Substance and Sexual Activity   Alcohol use: No    Comment: previously drank heavily but quit ~ 21 yrs ago.   Drug use: No   Sexual activity: Yes  Other Topics Concern   Not on file  Social History Narrative   Lives in [place] by himself.  Works as Janitor 5 hours per day, 5 days per week. Does not routinely exercise.   Social Drivers of Corporate investment banker  Strain: Low Risk  (11/26/2023)   Overall Financial Resource Strain (CARDIA)    Difficulty of Paying Living Expenses: Not hard at all  Food Insecurity: No Food Insecurity (11/26/2023)   Hunger Vital Sign    Worried About Running Out of Food in the Last Year: Never true    Ran Out of Food in the Last Year: Never true  Transportation Needs: No Transportation Needs (11/26/2023)   PRAPARE - Administrator, Civil Service (Medical): No    Lack of Transportation (Non-Medical): No  Physical Activity: Sufficiently Active (11/26/2023)   Exercise Vital Sign    Days of Exercise per Week: 5 days    Minutes of Exercise per Session: 30 min  Stress: No Stress Concern Present (11/26/2023)   [employer] of Occupational Health - Occupational Stress Questionnaire    Feeling of Stress : Not at all  Social Connections: Socially Isolated (11/26/2023)   Social Connection and Isolation Panel    Frequency of Communication with Friends and Family: Three times a week    Frequency of Social Gatherings with Friends and Family: More than three times  a week    Attends Religious Services: Never    Active Member of Clubs or Organizations: No    Attends Banker Meetings: Never    Marital Status: Widowed  Intimate Partner Violence: Not At Risk (11/26/2023)   Humiliation, Afraid, Rape, and Kick questionnaire    Fear of Current or Ex-Partner: No    Emotionally Abused: No    Physically Abused: No    Sexually Abused: No    Past Surgical History:  Procedure Laterality Date   BACK SURGERY     CARDIAC CATHETERIZATION     COLONOSCOPY     COLONOSCOPY N/A 04/21/2024   Procedure: COLONOSCOPY;  Surgeon: Jinny Carmine, MD;  Location: [facility] ENDOSCOPY;  Service: Endoscopy;  Laterality: N/A;   CORONARY ARTERY BYPASS GRAFT N/A 07/01/2017   Procedure: CORONARY ARTERY BYPASS GRAFTING (CABG) x4, using left mammary artery and right greater saphenous vein harvested endoscopically;  Surgeon: Fleeta Hanford Coy, MD;  Location: [facility] OR;  Service: Open Heart Surgery;  Laterality: N/A;   KNEE SURGERY     LEFT HEART CATH AND CORONARY ANGIOGRAPHY N/A 06/04/2017   Procedure: Left Heart Cath and Coronary Angiography;  Surgeon: Mady Bruckner, MD;  Location: [facility] INVASIVE CV LAB;  Service: Cardiovascular;  Laterality: N/A;   TEE WITHOUT CARDIOVERSION N/A 07/01/2017   Procedure: TRANSESOPHAGEAL ECHOCARDIOGRAM (TEE);  Surgeon: Fleeta Hanford, Coy, MD;  Location: [facility] OR;  Service: Open Heart Surgery;  Laterality: N/A;    Family History  Problem Relation Age of Onset   Hypertension Mother    Diabetes Mother    Heart disease Father 76       multiple MI's    Allergies  Allergen Reactions   Lisinopril  Other (See Comments)    Fatigue and ED       Latest Ref Rng & Units 01/14/2024    2:34 PM 05/28/2023    4:33 PM 04/15/2023    2:06 PM  CBC  WBC 3.4 - 10.8 x10E3/uL 8.3  7.3  5.7   Hemoglobin 13.0 - 17.7 g/dL 83.3  83.0  83.3   Hematocrit 37.5 - 51.0 % 49.3  51.0  49.7   Platelets 150 - 450 x10E3/uL 180  179  201       CMP     Component Value Date/Time   NA 144 07/15/2024 1439   K 4.1 07/15/2024 1439   CL 104 07/15/2024 1439   CO2 20 07/15/2024 1439   GLUCOSE 102 (H) 07/15/2024 1439   GLUCOSE 191 (H) 05/28/2023 1633   BUN 24 07/15/2024 1439   CREATININE 1.53 (H) 07/15/2024 1439   CALCIUM  9.7 07/15/2024 1439   PROT 6.5 07/15/2024 1439   ALBUMIN  4.8 07/15/2024 1439   AST 15 07/15/2024 1439   ALT 18 07/15/2024 1439   ALKPHOS 65 07/15/2024 1439   BILITOT 0.3 07/15/2024 1439   EGFR 48 (L) 07/15/2024 1439   GFRNONAA >60 05/28/2023 1633     VAS US  ABI WITH/WO TBI Result Date: 09/21/2024  LOWER EXTREMITY DOPPLER STUDY Patient Name:  Brendan [name]  Date of Exam:   09/16/2024 Medical Rec #: [id number]    Accession #:    [id number] Date of Birth: 12/17/1951    Patient Gender: M Patient Age:   77 years Exam Location:  [facility] Procedure:      VAS US  ABI WITH/WO TBI Referring Phys:  --------------------------------------------------------------------------------  Indications: Claudication, and peripheral artery disease. High Risk Factors: Hypertension, Diabetes, past history of smoking, coronary  artery disease.  Performing Technologist: Donnice Charnley RVT  Examination Guidelines: A complete evaluation includes at minimum, Doppler waveform signals and systolic blood pressure reading at the level of bilateral brachial, anterior tibial, and posterior tibial arteries, when vessel segments are accessible. Bilateral testing is considered an integral part of a complete examination. Photoelectric Plethysmograph (PPG) waveforms and toe systolic pressure readings are included as required and additional duplex testing as needed. Limited examinations for reoccurring indications may be performed as noted.  ABI Findings: +---------+------------------+-----+----------+--------+ Right    Rt Pressure (mmHg)IndexWaveform  Comment  +---------+------------------+-----+----------+--------+ Brachial 161                                       +---------+------------------+-----+----------+--------+ PTA      104               0.63 monophasic         +---------+------------------+-----+----------+--------+ DP       121               0.73 monophasic         +---------+------------------+-----+----------+--------+ Great Toe95                0.57                    +---------+------------------+-----+----------+--------+ +---------+------------------+-----+----------+-------+ Left     Lt Pressure (mmHg)IndexWaveform  Comment +---------+------------------+-----+----------+-------+ Brachial 166                                      +---------+------------------+-----+----------+-------+ PTA      65                0.39 monophasic        +---------+------------------+-----+----------+-------+ DP       79                0.48 monophasic         +---------+------------------+-----+----------+-------+ Great Toe52                0.31                   +---------+------------------+-----+----------+-------+ +-------+-----------+-----------+------------+------------+ ABI/TBIToday's ABIToday's TBIPrevious ABIPrevious TBI +-------+-----------+-----------+------------+------------+ Right  0.73       0.57       0.84        0.62         +-------+-----------+-----------+------------+------------+ Left   0.48       0.31       0.58        0.38         +-------+-----------+-----------+------------+------------+  Bilateral ABIs appear decreased compared to prior study on 07/27/2024.  Summary: Right: Resting right ankle-brachial index indicates moderate right lower extremity arterial disease. The right toe-brachial index is abnormal.  Left: Resting left ankle-brachial index indicates severe left lower extremity arterial disease. The left toe-brachial index is abnormal.  *See table(s) above for measurements and observations.  Electronically signed by Cordella Shawl MD on 09/21/2024 at 10:40:05 AM.    Final        Assessment & Plan:   1. Atherosclerosis of native artery of both lower extremities with rest pain (HCC) (Primary) Recommend:  The patient has evidence of severe atherosclerotic changes of both lower extremities with rest pain that is associated with preulcerative changes and impending tissue loss of  the bilateral foot.  This represents a limb threatening ischemia and places the patient at the risk for bilateral limb loss.  Patient should undergo angiography of the  lower extremity with the hope for intervention for limb salvage.  The risks and benefits as well as the alternative therapies was discussed in detail with the patient.  All questions were answered.  Patient agrees to proceed with left then right lower extremity angiography.  The patient will follow up with me in the office after the procedure.      2.  Hyperlipidemia associated with type 2 diabetes mellitus (HCC) Continue statin as ordered and reviewed, no changes at this time  3. Diabetes mellitus treated with oral medication (HCC) Continue hypoglycemic medications as already ordered, these medications have been reviewed and there are no changes at this time.  Hgb A1C to be monitored as already arranged by primary service   Current Outpatient Medications on File Prior to Visit  Medication Sig Dispense Refill   acetaminophen  (TYLENOL ) 500 MG tablet Take 1 tablet (500 mg total) by mouth every 6 (six) hours as needed for mild pain or headache. 30 tablet 0   albuterol  (VENTOLIN  HFA) 108 (90 Base) MCG/ACT inhaler Inhale 2 puffs into the lungs every 6 (six) hours as needed for wheezing or shortness of breath. 18 g 4   amLODipine  (NORVASC ) 5 MG tablet Take 1 tablet (5 mg total) by mouth daily. 90 tablet 1   aspirin  EC 81 MG tablet Take 1 tablet (81 mg total) by mouth daily.     atorvastatin  (LIPITOR) 20 MG tablet Take 1 tablet (20 mg total) by mouth daily. 90 tablet 2   azelastine  (ASTELIN ) 0.1 % nasal spray Place 2 sprays into both nostrils at bedtime.     chlorthalidone  (HYGROTON ) 25 MG tablet Take 1 tablet (25 mg total) by mouth daily. 90 tablet 0   cyanocobalamin  1000 MCG tablet Take 1,000 mcg by mouth every other day.     dapagliflozin  propanediol (FARXIGA ) 10 MG TABS tablet Take 1 tablet (10 mg total) by mouth daily before breakfast. 90 tablet 4   gabapentin  (NEURONTIN ) 300 MG capsule Take one capsule at lunchtime and two capsules at bedtime 270 capsule 3   lidocaine  (LIDODERM ) 5 % Place 1 patch onto the skin daily. Remove & Discard patch within 12 hours or as directed by MD 30 patch 0   loratadine  (CLARITIN ) 10 MG tablet Take 1 tablet (10 mg total) by mouth daily. 30 tablet 2   metFORMIN  (GLUCOPHAGE ) 1000 MG tablet Take 1 tablet (1,000 mg total) by mouth 2 (two) times daily with a meal. 180 tablet 3   metoprolol  tartrate (LOPRESSOR ) 25 MG  tablet Take 0.5 tablets (12.5 mg total) by mouth 2 (two) times daily. 180 tablet 3   montelukast  (SINGULAIR ) 10 MG tablet Take 1 tablet (10 mg total) by mouth at bedtime. 90 tablet 0   nitroGLYCERIN  (NITROSTAT ) 0.4 MG SL tablet Place 1 tablet (0.4 mg total) under the tongue every 5 (five) minutes as needed for chest pain. 25 tablet 3   sildenafil  (REVATIO ) 20 MG tablet TAKE 1 TO 5 TABLETS AS NEEDED. 30 tablet 0   sitaGLIPtin  (JANUVIA ) 100 MG tablet Take 1 tablet (100 mg total) by mouth daily. 90 tablet 4   tamsulosin  (FLOMAX ) 0.4 MG CAPS capsule Take 1 capsule (0.4 mg total) by mouth daily. 90 capsule 4   tiZANidine  (ZANAFLEX ) 4 MG tablet Take 1 tablet (4 mg total) by mouth every 6 (six)  hours as needed for muscle spasms. 45 tablet 1   No current facility-administered medications on file prior to visit.    There are no Patient Instructions on file for this visit. No follow-ups on file.   Toribio Seiber E Jadis Pitter, NP

## 2024-09-27 NOTE — Progress Notes (Signed)
 Subjective:    Patient ID: Brendan [name], male    DOB: 06-Jul-1951, 73 y.o.   MRN: [id number] Chief Complaint  Patient presents with   New Patient (Initial Visit)     np. ABI/LEA + consult. PAD  ref: Wyatt,Max       HPI  Review of Systems     Objective:   Physical Exam  BP (!) 147/89   Pulse 67   Ht 6' 2 (1.88 m)   Wt 202 lb (91.6 kg)   BMI 25.94 kg/m   Past Medical History:  Diagnosis Date   CAD (coronary artery disease)    a. 05/2016 Lexi MV: EF 58%, no ischemia/infarct; b. 05/2017 Cath: LM 20-30, LAD 50p, 25m, D1 50p, D2 80ost, LCX 90/70ost, RCA 80/90p/m, RPL1 50p, EF 50-55%; c. 06/2017 CABG x 4 (LIMA->LAD, VG->D2,VG->OM, VG->PDA).   Diabetes mellitus without complication (HCC)    Diastolic dysfunction    a. 05/2016 Echo: EF 55%, Gr1 DD, mild TR/MR.   Hyperlipidemia    Hypertension    Hypokalemia    Personal history of tobacco use, presenting hazards to health 05/03/2016   Post-op Afib    a. 06/2017 following CABG->short course of amio.    Social History   Socioeconomic History   Marital status: Widowed    Spouse name: Not on file   Number of children: 3   Years of education: Not on file   Highest education level: 10th grade  Occupational History   Occupation: works part time as Copy   Tobacco Use   Smoking status: Former    Current packs/day: 0.00    Average packs/day: 1 pack/day for 60.0 years (60.0 ttl pk-yrs)    Types: Cigarettes    Start date: 06/29/1957    Quit date: 06/29/2017    Years since quitting: 7.2   Smokeless tobacco: Never  Vaping Use   Vaping status: Never Used  Substance and Sexual Activity   Alcohol use: No    Comment: previously drank heavily but quit ~ 21 yrs ago.   Drug use: No   Sexual activity: Yes  Other Topics Concern   Not on file  Social History Narrative   Lives in [place] by himself.  Works as Janitor 5 hours per day, 5 days per week. Does not routinely exercise.   Social Drivers of Corporate investment banker  Strain: Low Risk  (11/26/2023)   Overall Financial Resource Strain (CARDIA)    Difficulty of Paying Living Expenses: Not hard at all  Food Insecurity: No Food Insecurity (11/26/2023)   Hunger Vital Sign    Worried About Running Out of Food in the Last Year: Never true    Ran Out of Food in the Last Year: Never true  Transportation Needs: No Transportation Needs (11/26/2023)   PRAPARE - Administrator, Civil Service (Medical): No    Lack of Transportation (Non-Medical): No  Physical Activity: Sufficiently Active (11/26/2023)   Exercise Vital Sign    Days of Exercise per Week: 5 days    Minutes of Exercise per Session: 30 min  Stress: No Stress Concern Present (11/26/2023)   [employer] of Occupational Health - Occupational Stress Questionnaire    Feeling of Stress : Not at all  Social Connections: Socially Isolated (11/26/2023)   Social Connection and Isolation Panel    Frequency of Communication with Friends and Family: Three times a week    Frequency of Social Gatherings with Friends and Family: More than three times  a week    Attends Religious Services: Never    Active Member of Clubs or Organizations: No    Attends Banker Meetings: Never    Marital Status: Widowed  Intimate Partner Violence: Not At Risk (11/26/2023)   Humiliation, Afraid, Rape, and Kick questionnaire    Fear of Current or Ex-Partner: No    Emotionally Abused: No    Physically Abused: No    Sexually Abused: No    Past Surgical History:  Procedure Laterality Date   BACK SURGERY     CARDIAC CATHETERIZATION     COLONOSCOPY     COLONOSCOPY N/A 04/21/2024   Procedure: COLONOSCOPY;  Surgeon: Jinny Carmine, MD;  Location: [facility] ENDOSCOPY;  Service: Endoscopy;  Laterality: N/A;   CORONARY ARTERY BYPASS GRAFT N/A 07/01/2017   Procedure: CORONARY ARTERY BYPASS GRAFTING (CABG) x4, using left mammary artery and right greater saphenous vein harvested endoscopically;  Surgeon: Fleeta Hanford Coy, MD;  Location: [facility] OR;  Service: Open Heart Surgery;  Laterality: N/A;   KNEE SURGERY     LEFT HEART CATH AND CORONARY ANGIOGRAPHY N/A 06/04/2017   Procedure: Left Heart Cath and Coronary Angiography;  Surgeon: Mady Bruckner, MD;  Location: [facility] INVASIVE CV LAB;  Service: Cardiovascular;  Laterality: N/A;   TEE WITHOUT CARDIOVERSION N/A 07/01/2017   Procedure: TRANSESOPHAGEAL ECHOCARDIOGRAM (TEE);  Surgeon: Fleeta Hanford, Coy, MD;  Location: [facility] OR;  Service: Open Heart Surgery;  Laterality: N/A;    Family History  Problem Relation Age of Onset   Hypertension Mother    Diabetes Mother    Heart disease Father 76       multiple MI's    Allergies  Allergen Reactions   Lisinopril  Other (See Comments)    Fatigue and ED       Latest Ref Rng & Units 01/14/2024    2:34 PM 05/28/2023    4:33 PM 04/15/2023    2:06 PM  CBC  WBC 3.4 - 10.8 x10E3/uL 8.3  7.3  5.7   Hemoglobin 13.0 - 17.7 g/dL 83.3  83.0  83.3   Hematocrit 37.5 - 51.0 % 49.3  51.0  49.7   Platelets 150 - 450 x10E3/uL 180  179  201       CMP     Component Value Date/Time   NA 144 07/15/2024 1439   K 4.1 07/15/2024 1439   CL 104 07/15/2024 1439   CO2 20 07/15/2024 1439   GLUCOSE 102 (H) 07/15/2024 1439   GLUCOSE 191 (H) 05/28/2023 1633   BUN 24 07/15/2024 1439   CREATININE 1.53 (H) 07/15/2024 1439   CALCIUM  9.7 07/15/2024 1439   PROT 6.5 07/15/2024 1439   ALBUMIN  4.8 07/15/2024 1439   AST 15 07/15/2024 1439   ALT 18 07/15/2024 1439   ALKPHOS 65 07/15/2024 1439   BILITOT 0.3 07/15/2024 1439   EGFR 48 (L) 07/15/2024 1439   GFRNONAA >60 05/28/2023 1633     VAS US  ABI WITH/WO TBI Result Date: 09/21/2024  LOWER EXTREMITY DOPPLER STUDY Patient Name:  Brendan [name]  Date of Exam:   09/16/2024 Medical Rec #: [id number]    Accession #:    [id number] Date of Birth: 12/17/1951    Patient Gender: M Patient Age:   77 years Exam Location:  [facility] Procedure:      VAS US  ABI WITH/WO TBI Referring Phys:  --------------------------------------------------------------------------------  Indications: Claudication, and peripheral artery disease. High Risk Factors: Hypertension, Diabetes, past history of smoking, coronary  artery disease.  Performing Technologist: Donnice Charnley RVT  Examination Guidelines: A complete evaluation includes at minimum, Doppler waveform signals and systolic blood pressure reading at the level of bilateral brachial, anterior tibial, and posterior tibial arteries, when vessel segments are accessible. Bilateral testing is considered an integral part of a complete examination. Photoelectric Plethysmograph (PPG) waveforms and toe systolic pressure readings are included as required and additional duplex testing as needed. Limited examinations for reoccurring indications may be performed as noted.  ABI Findings: +---------+------------------+-----+----------+--------+ Right    Rt Pressure (mmHg)IndexWaveform  Comment  +---------+------------------+-----+----------+--------+ Brachial 161                                       +---------+------------------+-----+----------+--------+ PTA      104               0.63 monophasic         +---------+------------------+-----+----------+--------+ DP       121               0.73 monophasic         +---------+------------------+-----+----------+--------+ Great Toe95                0.57                    +---------+------------------+-----+----------+--------+ +---------+------------------+-----+----------+-------+ Left     Lt Pressure (mmHg)IndexWaveform  Comment +---------+------------------+-----+----------+-------+ Brachial 166                                      +---------+------------------+-----+----------+-------+ PTA      65                0.39 monophasic        +---------+------------------+-----+----------+-------+ DP       79                0.48 monophasic         +---------+------------------+-----+----------+-------+ Great Toe52                0.31                   +---------+------------------+-----+----------+-------+ +-------+-----------+-----------+------------+------------+ ABI/TBIToday's ABIToday's TBIPrevious ABIPrevious TBI +-------+-----------+-----------+------------+------------+ Right  0.73       0.57       0.84        0.62         +-------+-----------+-----------+------------+------------+ Left   0.48       0.31       0.58        0.38         +-------+-----------+-----------+------------+------------+  Bilateral ABIs appear decreased compared to prior study on 07/27/2024.  Summary: Right: Resting right ankle-brachial index indicates moderate right lower extremity arterial disease. The right toe-brachial index is abnormal.  Left: Resting left ankle-brachial index indicates severe left lower extremity arterial disease. The left toe-brachial index is abnormal.  *See table(s) above for measurements and observations.  Electronically signed by Cordella Shawl MD on 09/21/2024 at 10:40:05 AM.    Final        Assessment & Plan:   1. Atherosclerosis of native artery of both lower extremities with rest pain (HCC) (Primary) Recommend:  The patient has evidence of severe atherosclerotic changes of both lower extremities with rest pain that is associated with preulcerative changes and impending tissue loss of  the bilateral foot.  This represents a limb threatening ischemia and places the patient at the risk for bilateral limb loss.  Patient should undergo angiography of the  lower extremity with the hope for intervention for limb salvage.  The risks and benefits as well as the alternative therapies was discussed in detail with the patient.  All questions were answered.  Patient agrees to proceed with left then right lower extremity angiography.  The patient will follow up with me in the office after the procedure.      2.  Hyperlipidemia associated with type 2 diabetes mellitus (HCC) Continue statin as ordered and reviewed, no changes at this time  3. Diabetes mellitus treated with oral medication (HCC) Continue hypoglycemic medications as already ordered, these medications have been reviewed and there are no changes at this time.  Hgb A1C to be monitored as already arranged by primary service   Current Outpatient Medications on File Prior to Visit  Medication Sig Dispense Refill   acetaminophen  (TYLENOL ) 500 MG tablet Take 1 tablet (500 mg total) by mouth every 6 (six) hours as needed for mild pain or headache. 30 tablet 0   albuterol  (VENTOLIN  HFA) 108 (90 Base) MCG/ACT inhaler Inhale 2 puffs into the lungs every 6 (six) hours as needed for wheezing or shortness of breath. 18 g 4   amLODipine  (NORVASC ) 5 MG tablet Take 1 tablet (5 mg total) by mouth daily. 90 tablet 1   aspirin  EC 81 MG tablet Take 1 tablet (81 mg total) by mouth daily.     atorvastatin  (LIPITOR) 20 MG tablet Take 1 tablet (20 mg total) by mouth daily. 90 tablet 2   azelastine  (ASTELIN ) 0.1 % nasal spray Place 2 sprays into both nostrils at bedtime.     chlorthalidone  (HYGROTON ) 25 MG tablet Take 1 tablet (25 mg total) by mouth daily. 90 tablet 0   cyanocobalamin  1000 MCG tablet Take 1,000 mcg by mouth every other day.     dapagliflozin  propanediol (FARXIGA ) 10 MG TABS tablet Take 1 tablet (10 mg total) by mouth daily before breakfast. 90 tablet 4   gabapentin  (NEURONTIN ) 300 MG capsule Take one capsule at lunchtime and two capsules at bedtime 270 capsule 3   lidocaine  (LIDODERM ) 5 % Place 1 patch onto the skin daily. Remove & Discard patch within 12 hours or as directed by MD 30 patch 0   loratadine  (CLARITIN ) 10 MG tablet Take 1 tablet (10 mg total) by mouth daily. 30 tablet 2   metFORMIN  (GLUCOPHAGE ) 1000 MG tablet Take 1 tablet (1,000 mg total) by mouth 2 (two) times daily with a meal. 180 tablet 3   metoprolol  tartrate (LOPRESSOR ) 25 MG  tablet Take 0.5 tablets (12.5 mg total) by mouth 2 (two) times daily. 180 tablet 3   montelukast  (SINGULAIR ) 10 MG tablet Take 1 tablet (10 mg total) by mouth at bedtime. 90 tablet 0   nitroGLYCERIN  (NITROSTAT ) 0.4 MG SL tablet Place 1 tablet (0.4 mg total) under the tongue every 5 (five) minutes as needed for chest pain. 25 tablet 3   sildenafil  (REVATIO ) 20 MG tablet TAKE 1 TO 5 TABLETS AS NEEDED. 30 tablet 0   sitaGLIPtin  (JANUVIA ) 100 MG tablet Take 1 tablet (100 mg total) by mouth daily. 90 tablet 4   tamsulosin  (FLOMAX ) 0.4 MG CAPS capsule Take 1 capsule (0.4 mg total) by mouth daily. 90 capsule 4   tiZANidine  (ZANAFLEX ) 4 MG tablet Take 1 tablet (4 mg total) by mouth every 6 (six)  hours as needed for muscle spasms. 45 tablet 1   No current facility-administered medications on file prior to visit.    There are no Patient Instructions on file for this visit. No follow-ups on file.   Toribio Seiber E Jadis Pitter, NP

## 2024-09-28 ENCOUNTER — Telehealth (INDEPENDENT_AMBULATORY_CARE_PROVIDER_SITE_OTHER): Payer: Self-pay

## 2024-09-28 NOTE — Telephone Encounter (Signed)
 Spoke to the patient and he is scheduled with Dr. Jama for a LLE (10/13/24 - 11:00 am) and RLE (10/20/24 - 6:45 am) at the [facility]. Pre-procedure instructions were discussed and will be mailed.

## 2024-09-30 ENCOUNTER — Encounter: Payer: Self-pay | Admitting: Internal Medicine

## 2024-09-30 ENCOUNTER — Ambulatory Visit: Attending: Internal Medicine | Admitting: Internal Medicine

## 2024-09-30 VITALS — BP 118/76 | HR 71 | Wt 197.0 lb

## 2024-09-30 DIAGNOSIS — E785 Hyperlipidemia, unspecified: Secondary | ICD-10-CM | POA: Diagnosis not present

## 2024-09-30 DIAGNOSIS — I2510 Atherosclerotic heart disease of native coronary artery without angina pectoris: Secondary | ICD-10-CM

## 2024-09-30 DIAGNOSIS — I10 Essential (primary) hypertension: Secondary | ICD-10-CM

## 2024-09-30 DIAGNOSIS — E1169 Type 2 diabetes mellitus with other specified complication: Secondary | ICD-10-CM

## 2024-09-30 DIAGNOSIS — I739 Peripheral vascular disease, unspecified: Secondary | ICD-10-CM | POA: Diagnosis not present

## 2024-09-30 NOTE — Progress Notes (Signed)
 Cardiology Office Note:  .   Date:  09/30/2024  ID:  Brendan [name], DOB Oct 31, 1951, MRN [id number] PCP: Valerio Melanie DASEN, NP  [facility] Providers Cardiologist:  Lonni Hanson, MD     History of Present Illness: .   Brendan [name] is a 73 y.o. male with history of CAD status post four-vessel CABG in 06/2017 with LIMA to LAD, SVG to D2, SVG to OM, SVG to PDA, postoperative A. 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  He was last seen for preoperative cardiovascular risk assessment in anticipation of colonoscopy and 03/2024 by Lesley Maffucci, PA, at which time he was feeling well other than shortness of breath with heavy exertion.  Today, [name] notes that his legs have been bothering him quite a bit since our last visit.  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  He was recently found to have significant bilateral lower extremity PAD and is scheduled for interventions with Dr. Jama later this month.  He does not have any rest pain or wounds/ulcers.  From a heart standpoint, he feels fairly well with chronic exertional dyspnea, unchanged from last year.  He denies chest pain, palpitations, and lightheadedness.  He notes some lower extremity edema that began around the time of his claudication.  ROS: See HPI  Studies Reviewed: SABRA   EKG Interpretation Date/Time:  Wednesday September 30 2024 13:29:32 EDT Ventricular Rate:  71 PR Interval:  154 QRS Duration:  92 QT Interval:  404 QTC Calculation: 439 R Axis:   77  Text Interpretation: Sinus rhythm with Premature atrial complexes Borderline ECG When compared with ECG of 02-Apr-2024 14:02, No significant change was found Confirmed by Zacari Radick, Lonni [phone]) on 09/30/2024 1:32:52 PM    Risk Assessment/Calculations:             Physical Exam:   VS:  BP 118/76 (BP  Location: Left Arm, Patient Position: Sitting, Cuff Size: Normal)   Pulse 71   Wt 197 lb (89.4 kg)   SpO2 95%   BMI 25.29 kg/m    Wt Readings from Last 3 Encounters:  09/30/24 197 lb (89.4 kg)  09/16/24 202 lb (91.6 kg)  07/15/24 197 lb 12.8 oz (89.7 kg)    General:  NAD. Neck: No JVD or HJR. Lungs: Clear to auscultation bilaterally without wheezes or crackles. Heart: Regular rate and rhythm without murmurs, rubs, or gallops. Abdomen: Soft, nontender, nondistended. Extremities: No lower extremity edema.  ASSESSMENT AND PLAN: .    Coronary artery disease: No angina reported.  Chronic exertional dyspnea is likely driven primarily by chronic lung disease.  Continue secondary prevention with aspirin  and atorvastatin ; lipids well-controlled with LDL of 50 on last check in July.  PAD: Lifestyle limiting claudication reported with minimal activity.  Continue ongoing management per vascular surgery.  Continue aggressive secondary prevention, including atorvastatin .  No further cardiac testing or intervention needed prior to moving with lower extremity revascularization, which is low risk from a perioperative cardiovascular standpoint.  Hypertension: Blood pressure well-controlled today.  Continue current doses of amlodipine , chlorthalidone , and metoprolol .  Hyperlipidemia associated with type 2 diabetes mellitus: Lipids well-controlled on last check in July.  Continue atorvastatin  20 mg daily.  Ongoing management of DM per Ms. [name].    Dispo: Return to clinic in 6 months.  Signed, Lonni Hanson, MD

## 2024-09-30 NOTE — Patient Instructions (Signed)

## 2024-10-11 NOTE — Patient Instructions (Signed)

## 2024-10-13 ENCOUNTER — Other Ambulatory Visit: Payer: Self-pay

## 2024-10-13 ENCOUNTER — Encounter: Payer: Self-pay | Admitting: Vascular Surgery

## 2024-10-13 ENCOUNTER — Encounter
Admission: RE | Disposition: A | Discharge status: Home / Self Care | Payer: Self-pay | Source: Home / Self Care | Attending: Vascular Surgery

## 2024-10-13 ENCOUNTER — Ambulatory Visit
Admission: RE | Admit: 2024-10-13 | Discharge: 2024-10-13 | Disposition: A | Discharge status: Home / Self Care | Attending: Vascular Surgery | Admitting: Vascular Surgery

## 2024-10-13 DIAGNOSIS — I70222 Atherosclerosis of native arteries of extremities with rest pain, left leg: Secondary | ICD-10-CM

## 2024-10-13 DIAGNOSIS — I700 Atherosclerosis of aorta: Secondary | ICD-10-CM

## 2024-10-13 DIAGNOSIS — I70223 Atherosclerosis of native arteries of extremities with rest pain, bilateral legs: Secondary | ICD-10-CM | POA: Diagnosis not present

## 2024-10-13 DIAGNOSIS — I70229 Atherosclerosis of native arteries of extremities with rest pain, unspecified extremity: Secondary | ICD-10-CM

## 2024-10-13 DIAGNOSIS — E1151 Type 2 diabetes mellitus with diabetic peripheral angiopathy without gangrene: Secondary | ICD-10-CM | POA: Diagnosis not present

## 2024-10-13 DIAGNOSIS — Z7984 Long term (current) use of oral hypoglycemic drugs: Secondary | ICD-10-CM | POA: Diagnosis not present

## 2024-10-13 DIAGNOSIS — Z87891 Personal history of nicotine dependence: Secondary | ICD-10-CM | POA: Insufficient documentation

## 2024-10-13 DIAGNOSIS — Z79899 Other long term (current) drug therapy: Secondary | ICD-10-CM | POA: Diagnosis not present

## 2024-10-13 DIAGNOSIS — E7849 Other hyperlipidemia: Secondary | ICD-10-CM | POA: Diagnosis not present

## 2024-10-13 DIAGNOSIS — E1169 Type 2 diabetes mellitus with other specified complication: Secondary | ICD-10-CM | POA: Diagnosis not present

## 2024-10-13 HISTORY — PX: LOWER EXTREMITY ANGIOGRAPHY: CATH118251

## 2024-10-13 LAB — CREATININE, SERUM
Creatinine, Ser: 1.43 mg/dL — ABNORMAL HIGH (ref 0.61–1.24)
GFR, Estimated: 52 mL/min — ABNORMAL LOW (ref >60)

## 2024-10-13 LAB — BUN: BUN: 24 mg/dL — ABNORMAL HIGH (ref 8–23)

## 2024-10-13 LAB — GLUCOSE, CAPILLARY
Glucose-Capillary: 142 mg/dL — ABNORMAL HIGH (ref 70–99)
Glucose-Capillary: 122 mg/dL — ABNORMAL HIGH (ref 70–99)

## 2024-10-13 SURGERY — LOWER EXTREMITY ANGIOGRAPHY
Anesthesia: Moderate Sedation | Site: Leg Lower | Laterality: Left

## 2024-10-13 MED ORDER — FAMOTIDINE 20 MG PO TABS: 40.0000 mg | ORAL_TABLET | Freq: Once | ORAL | Status: DC | PRN

## 2024-10-13 MED ORDER — SODIUM CHLORIDE 0.9 % IV SOLN: INTRAVENOUS | Status: DC

## 2024-10-13 MED ORDER — CLOPIDOGREL BISULFATE 75 MG PO TABS
ORAL_TABLET | ORAL | Status: AC
  Filled 2024-10-13: qty 4

## 2024-10-13 MED ORDER — MIDAZOLAM HCL 2 MG/ML PO SYRP: 8.0000 mg | ORAL_SOLUTION | Freq: Once | ORAL | Status: DC | PRN

## 2024-10-13 MED ORDER — METHYLPREDNISOLONE SODIUM SUCC 125 MG IJ SOLR: 125.0000 mg | Freq: Once | INTRAMUSCULAR | Status: DC | PRN

## 2024-10-13 MED ORDER — MIDAZOLAM HCL 2 MG/2ML IJ SOLN
INTRAMUSCULAR | Status: DC | PRN
  Administered 2024-10-13: 2 mg via INTRAVENOUS
  Administered 2024-10-13 (×2): 1 mg via INTRAVENOUS

## 2024-10-13 MED ORDER — FENTANYL CITRATE (PF) 100 MCG/2ML IJ SOLN
INTRAMUSCULAR | Status: DC | PRN
  Administered 2024-10-13: 50 ug via INTRAVENOUS
  Administered 2024-10-13: 25 ug via INTRAVENOUS
  Administered 2024-10-13: 50 ug via INTRAVENOUS
  Administered 2024-10-13: 25 ug via INTRAVENOUS

## 2024-10-13 MED ORDER — FENTANYL CITRATE (PF) 100 MCG/2ML IJ SOLN
INTRAMUSCULAR | Status: AC
  Filled 2024-10-13: qty 2

## 2024-10-13 MED ORDER — ONDANSETRON HCL 4 MG/2ML IJ SOLN: 4.0000 mg | Freq: Four times a day (QID) | INTRAMUSCULAR | Status: DC | PRN

## 2024-10-13 MED ORDER — MIDAZOLAM HCL 5 MG/5ML IJ SOLN
INTRAMUSCULAR | Status: AC
  Filled 2024-10-13: qty 5

## 2024-10-13 MED ORDER — HEPARIN SODIUM (PORCINE) 1000 UNIT/ML IJ SOLN
INTRAMUSCULAR | Status: DC | PRN
  Administered 2024-10-13: 6000 [IU] via INTRAVENOUS

## 2024-10-13 MED ORDER — DIPHENHYDRAMINE HCL 50 MG/ML IJ SOLN: 50.0000 mg | Freq: Once | INTRAMUSCULAR | Status: DC | PRN

## 2024-10-13 MED ORDER — HEPARIN (PORCINE) IN NACL 1000-0.9 UT/500ML-% IV SOLN
INTRAVENOUS | Status: DC | PRN
  Administered 2024-10-13: 1000 mL

## 2024-10-13 MED ORDER — HEPARIN SODIUM (PORCINE) 1000 UNIT/ML IJ SOLN
INTRAMUSCULAR | Status: AC
  Filled 2024-10-13: qty 10

## 2024-10-13 MED ORDER — MORPHINE SULFATE (PF) 2 MG/ML IV SOLN: 2.0000 mg | INTRAVENOUS | Status: DC | PRN

## 2024-10-13 MED ORDER — HYDROMORPHONE HCL 1 MG/ML IJ SOLN: 1.0000 mg | Freq: Once | INTRAMUSCULAR | Status: DC | PRN

## 2024-10-13 MED ORDER — CEFAZOLIN SODIUM-DEXTROSE 2-4 GM/100ML-% IV SOLN
2.0000 g | INTRAVENOUS | Status: AC
Start: 2024-10-13 — End: 2024-10-13
  Administered 2024-10-13: 2 g via INTRAVENOUS

## 2024-10-13 MED ORDER — CLOPIDOGREL BISULFATE 75 MG PO TABS: 75.0000 mg | ORAL_TABLET | Freq: Every day | ORAL | 5 refills | Status: AC

## 2024-10-13 MED ORDER — LIDOCAINE HCL (PF) 1 % IJ SOLN
INTRAMUSCULAR | Status: DC | PRN
  Administered 2024-10-13: 10 mL via INTRADERMAL

## 2024-10-13 MED ORDER — FENTANYL CITRATE (PF) 50 MCG/ML IJ SOSY
PREFILLED_SYRINGE | INTRAMUSCULAR | Status: AC
  Filled 2024-10-13: qty 1

## 2024-10-13 MED ORDER — CEFAZOLIN SODIUM-DEXTROSE 2-4 GM/100ML-% IV SOLN
INTRAVENOUS | Status: AC
  Filled 2024-10-13: qty 100

## 2024-10-13 MED ORDER — CLOPIDOGREL BISULFATE 300 MG PO TABS
300.0000 mg | ORAL_TABLET | ORAL | Status: AC
  Administered 2024-10-13: 300 mg via ORAL

## 2024-10-13 MED ORDER — IODIXANOL 320 MG/ML IV SOLN
INTRAVENOUS | Status: DC | PRN
  Administered 2024-10-13: 80 mL

## 2024-10-13 SURGICAL SUPPLY — 33 items
BALLOON JADE .014 3.0 X 40 (BALLOONS) IMPLANT
BALLOON JADE .014 4.0 X 20 (BALLOONS) IMPLANT
BALLOON LUTONIX 4X220X130 (BALLOONS) IMPLANT
BALLOON LUTONIX 6X220X130 (BALLOONS) IMPLANT
BALLOON LUTONIX DCB 6X100X130 (BALLOONS) IMPLANT
BALLOON LUTONIX DCB 6X60X130 (BALLOONS) IMPLANT
BALLOON ULTRSC 014 2.5X150X150 (BALLOONS) IMPLANT
BALLOON ULTRVRSE 2X150X150 (BALLOONS) IMPLANT
CATH ANGIO 5F PIGTAIL 65CM (CATHETERS) IMPLANT
CATH BEACON 5 .038 100 VERT TP (CATHETERS) IMPLANT
CATH CXI SUPP 2.6F 150 ANG (CATHETERS) IMPLANT
CATH SEEKER .018X150 (CATHETERS) IMPLANT
COVER PROBE ULTRASOUND 5X96 (MISCELLANEOUS) IMPLANT
DEVICE PRESTO INFLATION (MISCELLANEOUS) IMPLANT
DEVICE STARCLOSE SE CLOSURE (Vascular Products) IMPLANT
GLIDEWIRE ADV .014X300CM (WIRE) IMPLANT
GLIDEWIRE ADV .035X260CM (WIRE) IMPLANT
GOWN STRL REUS W/ TWL LRG LVL3 (GOWN DISPOSABLE) ×1 IMPLANT
LIFESTENT SOLO 7X200X135 (Permanent Stent) IMPLANT
NDL ENTRY 21GA 7CM ECHOTIP (NEEDLE) IMPLANT
NEEDLE ENTRY 21GA 7CM ECHOTIP (NEEDLE) ×1 IMPLANT
PACK ANGIOGRAPHY (CUSTOM PROCEDURE TRAY) ×1 IMPLANT
SET INTRO CAPELLA COAXIAL (SET/KITS/TRAYS/PACK) IMPLANT
SHEATH BRITE TIP 5FRX11 (SHEATH) IMPLANT
SHEATH RAABE 6FR (SHEATH) IMPLANT
STENT LIFESTENT 5F 7X60X135 (Permanent Stent) IMPLANT
STENT LIFESTENT 5F 7X80X135 (Permanent Stent) IMPLANT
STENT OTW ESPRIT BTK 2.5X38 (Permanent Stent) IMPLANT
STENT OTW ESPRIT BTK 3.0X38 (Permanent Stent) IMPLANT
SYR MEDRAD MARK 7 150ML (SYRINGE) IMPLANT
TUBING CONTRAST HIGH PRESS 72 (TUBING) IMPLANT
WIRE J 3MM .035X145CM (WIRE) IMPLANT
WIRE SUPRACORE 300CM (WIRE) IMPLANT

## 2024-10-13 NOTE — Progress Notes (Signed)
 Dr. Jama in at bedside, speaking with pt. And his 2 daughters re: PCI of left leg. All parties verbalized understanding of conversation with MD.

## 2024-10-13 NOTE — Progress Notes (Signed)
 Called into vascular lab: MD made aware of Cre 1.43.

## 2024-10-13 NOTE — Op Note (Signed)
 [facility]  Percutaneous Study/Intervention Procedural Note   Date of Surgery: 10/13/2024  Surgeon:  Cordella Shawl  Pre-operative Diagnosis: Atherosclerotic occlusive disease bilateral lower extremities with left lower extremity with rest pain.  Post-operative diagnosis:  Same  Procedure(s) Performed:             1.  Introduction catheter into left lower extremity 3rd order catheter placement               2.    Contrast injection left lower extremity for distal runoff             3.  Percutaneous transluminal angioplasty and stent placement left superficial femoral and popliteal arteries to 6 mm             4.  Percutaneous transluminal angioplasty and stent placement left anterior tibial in 2 locations             5.  Star close closure left common femoral arteriotomy  Anesthesia: Conscious sedation was administered under my direct supervision by the interventional radiology RN. IV Versed  plus fentanyl  were utilized. Continuous ECG, pulse oximetry and blood pressure was monitored throughout the entire procedure.  Conscious sedation was for a total of 102 minutes.  Sheath: 6 [place] Rabie right common femoral retrograde  Contrast: 80 cc  Fluoroscopy Time: 16.2 minutes  Indications:  Tanveer Brammer presents with increasing pain of the left lower extremity.  Noninvasive studies as well as physical examination show a marked deterioration in his arterial status.  This suggests the patient is having limb threatening ischemia. The risks and benefits are reviewed all questions answered patient agrees to proceed.  Procedure:   Farrell Pantaleo is a 73 y.o. y.o. male who was identified and appropriate procedural time out was performed.  The patient was then placed supine on the table and prepped and draped in the usual sterile fashion.    Ultrasound was placed in the sterile sleeve and the right groin was evaluated the right common femoral artery was echolucent and pulsatile  indicating patency.  Image was recorded for the permanent record and under real-time visualization a microneedle was inserted into the common femoral artery followed by the microwire and then the micro-sheath.  A J-wire was then advanced through the micro-sheath and a  5 [place] sheath was then inserted over a J-wire. J-wire was then advanced and a 5 French pigtail catheter was positioned at the level of T12.  AP projection of the aorta was then obtained. Pigtail catheter was repositioned to above the bifurcation and a RAO view of the pelvis was obtained.  Subsequently a pigtail catheter with an Advantage wire was used to cross the aortic bifurcation.  The catheter and wire were advanced down into the left distal external iliac artery. Oblique view of the femoral bifurcation was then obtained and subsequently the wire was reintroduced and the pigtail catheter negotiated into the SFA representing third order catheter placement. Distal runoff was then performed.  6000 units of heparin  was then given and allowed to circulate for several minutes.  A 6 French Rabie sheath was advanced up and over the bifurcation and positioned in the proximal superficial femoral artery  KMP catheter and advantage Glidewire were then negotiated down into the distal popliteal. Catheter was then advanced. Hand injection contrast demonstrated the tibial anatomy in further detail.   The detector is then repositioned to the proximal SFA where the first greater than 70% stenosis is noted.  A 7 mm  x 60 mm life stent is deployed across this lesion.  A 6 mm x 60 mm Lutonix drug-eluting balloon was used to angioplasty the proximal SFA.  The inflation was for 1 minute at 12 atm. Follow-up imaging demonstrated patency with less than 10% residual stenosis.  The detector is then repositioned more distally to image Hunter's canal and the above-knee popliteal.  Mrs. Performed by hand-injection through the sheath.  Initially a 4 mm x 220 mm Lutonix  drug-eluting balloon was used to angioplasty from the mid popliteal proximally.  Follow-up imaging demonstrates greater than 60% residual stenosis at multiple locations.  Again a 7 mm x 200 mm life stent is then deployed from the mid popliteal proximally.  A second 7 mm x 80 mm life stent is deployed overlapping the popliteal stent.  This is then postdilated with 6 mm Lutonix drug-eluting balloons inflated to 14 to 16 atm for approximately 1 minute.  Follow-up imaging demonstrates less than 10% residual stenosis throughout the mid SFA and popliteal artery.  The detector was then positioned distally.  Using a Kumpe catheter and a 0.014 advantage wire I was able to negotiate the wire catheter into the anterior tibial.  I then exchanged the Kumpe catheter for a 0.018 seeker catheter.  Hand-injection of contrast from the mid AT is then performed which demonstrates that it is patent down to the ankle filling the dorsalis pedis and there are no hemodynamically significant stenoses from the mid AT distally.  However the origin demonstrates a greater than 90% stenosis and there is subsequently diffuse greater than 70% disease over the next 10 to 12 cm.  Initially a 2 mm x 150 mm Ultraverse balloon was used to treat the anterior tibial.  Inflation is to 12 to 14 atm for 1 full minute.  Next a 2.5 mm x 150 mm ultra score balloon is used.  Inflation is to 12 atm for 1 minute.  Follow-up imaging demonstrates greater than 60% residual stenosis at the origin and a 3 mm x 38 mm Esprit stent is deployed extending slightly into the popliteal and around the first band of the anterior tibial.  This is then postdilated with a 3 mm balloon inflated to 14 atm distally and then a 4 mm balloon proximally to oppose the popliteal portion.  Inflations are to 16 atm.  Follow-up imaging demonstrates patency throughout the origin of the anterior tibial however approximately 4 to 5 cm more distally there is a residual focal greater than 60%  stenosis.  A 2.5 x 38 Esprit stent is then advanced across this lesion and deployed to 16 atm.  It is then postdilated with a 3 mm x 40 mm balloon inflated to 12 atm.  Follow-up imaging demonstrates wide patency of the anterior tibial from its origin down to the foot.    After review of these images the sheath is pulled into the right external iliac oblique of the common femoral is obtained and a Star close device deployed. There no immediate Complications.  Findings:  The abdominal aorta is opacified with a bolus injection contrast. Renal arteries are single and widely patent without evidence of hemodynamically significant stenosis.  The aorta itself has diffuse disease but no hemodynamically significant lesions. The common and external iliac arteries are widely patent bilaterally.  The left common femoral is widely patent as is the profunda femoris.  The SFA does indeed have a significant stenosis proximally there is a greater than 60% stenosis and then beginning in it the  midportion extending through Hunter's canal and into the above-knee popliteal there is diffuse greater than 70% stenosis with a focal popliteal lesion of greater than 95%.  The distal popliteal demonstrates nonhemodynamically significant disease and the trifurcation is heavily diseased with subtotal occlusion of the anterior tibial at its origin as well as the origin of the tibioperoneal trunk.  The anterior tibial is diseased in its proximal one third with several lesions greater than 80%.  The posterior tibial and peroneal are profoundly diseased and do not reach the ankle.    Following angioplasty and stent placement in 2 locations the anterior tibial now is in-line flow and looks quite nice with less than 10% residual stenosis. Angioplasty of the SFA and the popliteal artery at Hunter's canal yields an excellent result with less than 10% residual stenosis.  Summary: Successful recanalization left lower extremity for limb salvage                            Disposition: Patient was taken to the recovery room in stable condition having tolerated the procedure well.  Cordella Bethanie Bloxom 10/13/2024,2:41 PM

## 2024-10-13 NOTE — Interval H&P Note (Signed)
 History and Physical Interval Note:  10/13/2024 12:18 PM  [name]  has presented today for surgery, with the diagnosis of LLE Angio   ASO w rest pain.  The various methods of treatment have been discussed with the patient and family. After consideration of risks, benefits and other options for treatment, the patient has consented to  Procedure(s): Lower Extremity Angiography (Left) as a surgical intervention.  The patient's history has been reviewed, patient examined, no change in status, stable for surgery.  I have reviewed the patient's chart and labs.  Questions were answered to the patient's satisfaction.     Brendan [name]

## 2024-10-14 ENCOUNTER — Encounter: Payer: Self-pay | Admitting: Vascular Surgery

## 2024-10-14 ENCOUNTER — Telehealth (INDEPENDENT_AMBULATORY_CARE_PROVIDER_SITE_OTHER): Payer: Self-pay | Admitting: Vascular Surgery

## 2024-10-14 NOTE — Telephone Encounter (Signed)
 Called pt daughter 2x and LVM to schedule post-op appt with Jama Hacker. MD.   Follow up with Schnier, Hacker MATSU, MD (Vascular Surgery) in 3 weeks (11/03/2024); follow up after procedure with an ABIs

## 2024-10-16 ENCOUNTER — Encounter: Payer: Self-pay | Admitting: Nurse Practitioner

## 2024-10-16 ENCOUNTER — Ambulatory Visit (INDEPENDENT_AMBULATORY_CARE_PROVIDER_SITE_OTHER): Admitting: Nurse Practitioner

## 2024-10-16 VITALS — BP 117/72 | HR 74 | Temp 98.3°F | Ht 74.2 in | Wt 193.4 lb

## 2024-10-16 DIAGNOSIS — Z7984 Long term (current) use of oral hypoglycemic drugs: Secondary | ICD-10-CM

## 2024-10-16 DIAGNOSIS — E1140 Type 2 diabetes mellitus with diabetic neuropathy, unspecified: Secondary | ICD-10-CM | POA: Diagnosis not present

## 2024-10-16 DIAGNOSIS — I152 Hypertension secondary to endocrine disorders: Secondary | ICD-10-CM

## 2024-10-16 DIAGNOSIS — E1159 Type 2 diabetes mellitus with other circulatory complications: Secondary | ICD-10-CM | POA: Diagnosis not present

## 2024-10-16 DIAGNOSIS — I739 Peripheral vascular disease, unspecified: Secondary | ICD-10-CM

## 2024-10-16 DIAGNOSIS — E1169 Type 2 diabetes mellitus with other specified complication: Secondary | ICD-10-CM | POA: Diagnosis not present

## 2024-10-16 DIAGNOSIS — Z23 Encounter for immunization: Secondary | ICD-10-CM | POA: Diagnosis not present

## 2024-10-16 DIAGNOSIS — E119 Type 2 diabetes mellitus without complications: Secondary | ICD-10-CM

## 2024-10-16 DIAGNOSIS — I5032 Chronic diastolic (congestive) heart failure: Secondary | ICD-10-CM

## 2024-10-16 DIAGNOSIS — I288 Other diseases of pulmonary vessels: Secondary | ICD-10-CM | POA: Diagnosis not present

## 2024-10-16 DIAGNOSIS — J432 Centrilobular emphysema: Secondary | ICD-10-CM

## 2024-10-16 DIAGNOSIS — E785 Hyperlipidemia, unspecified: Secondary | ICD-10-CM

## 2024-10-16 LAB — BAYER DCA HB A1C WAIVED: HB A1C (BAYER DCA - WAIVED): 6.5 % — ABNORMAL HIGH (ref 4.8–5.6)

## 2024-10-16 MED ORDER — MONTELUKAST SODIUM 10 MG PO TABS: 10.0000 mg | ORAL_TABLET | Freq: Every day | ORAL | 3 refills | Status: AC

## 2024-10-16 MED ORDER — AMLODIPINE BESYLATE 5 MG PO TABS: 5.0000 mg | ORAL_TABLET | Freq: Every day | ORAL | 3 refills | Status: AC

## 2024-10-16 MED ORDER — DAPAGLIFLOZIN PROPANEDIOL 10 MG PO TABS: 10.0000 mg | ORAL_TABLET | Freq: Every day | ORAL | 4 refills | Status: AC

## 2024-10-16 MED ORDER — SITAGLIPTIN PHOSPHATE 100 MG PO TABS: 100.0000 mg | ORAL_TABLET | Freq: Every day | ORAL | 4 refills | Status: AC

## 2024-10-16 MED ORDER — GABAPENTIN 300 MG PO CAPS: ORAL_CAPSULE | ORAL | 3 refills | Status: AC

## 2024-10-16 MED ORDER — ATORVASTATIN CALCIUM 20 MG PO TABS: 20.0000 mg | ORAL_TABLET | Freq: Every day | ORAL | 3 refills | Status: AC

## 2024-10-16 NOTE — Assessment & Plan Note (Signed)
 Refer to diabetes with neuropathy plan of care

## 2024-10-16 NOTE — Assessment & Plan Note (Signed)
 Noted on CT lung screening. He is agreeable to pulmonary referral next visit for further assessment of possible pulmonary hypertension.

## 2024-10-16 NOTE — Assessment & Plan Note (Signed)
 Chronic, stable.  April 2024 - FEV1 54% and FEV1/FVC 59%, change from in 2022 was FEV1 64% and FEV1/FVC 73%.  Continue Albuterol  and recommend Brendan [name] use this if wheezing present or SOB.  Continue cessation of smoking and continue yearly lung screening.  Spirometry annually.  Have recommended a daily inhaler for maintenance and to help lung function, Brendan [name] refuses. Continue annual lung screening.  Brendan [name] is agreeable to pulmonary referral next visit for further assessment of possible pulmonary hypertension.

## 2024-10-16 NOTE — Progress Notes (Signed)
 BP 117/72   Pulse 74   Temp 98.3 F (36.8 C) (Oral)   Ht 6' 2.2 (1.885 m)   Wt 193 lb 6.4 oz (87.7 kg)   SpO2 97%   BMI 24.70 kg/m    Subjective:    Patient ID: Brendan [name], male    DOB: 01/11/1951, 73 y.o.   MRN: [id number]  HPI: Brendan [name] is a 73 y.o. male   Chief Complaint  Patient presents with   COPD   Congestive Heart Failure   Diabetes   Hyperlipidemia   Hypertension   DIABETES A1c in July was 6.7%. Takes Metformin  1000 MG BID, Januvia  100 MG daily, and Farxiga  10 MG daily + Gabapentin  300 MG in morning and 600 MG at bedtime and B12 for neuropathy. Last saw vascular on 09/16/24 for lower extremity pain and is going for stents on 10/20/24 (right leg), had initial surgery on 10/13/24 (left leg). Podiatry last saw him 09/21/24. Hypoglycemic episodes:no Polydipsia/polyuria: no Visual disturbance: no Chest pain: no Paresthesias: no Glucose Monitoring: yes  Accucheck frequency: daily 130 range, has had to stop Metformin  a few times due to recent surgeries  Fasting glucose:  Post prandial:   Evening:  Before meals: Taking Insulin ?: no  Long acting insulin :  Short acting insulin : Blood Pressure Monitoring: a few times a week Retinal Examination: Up to Date Foot Exam: Up to Date Diabetic Education: Completed Pneumovax: Up to Date Influenza: Up to Date Aspirin : yes   HYPERTENSION / HYPERLIPIDEMIA Takes Metoprolol , Hygroton , Amlodipine , and Lipitor. Has never used NTG.  Allergic to Lisinopril .    Saw cardiology last on 09/30/24 with no changes made. Stress test and Myo 06/21/23 -- EF 66%. Satisfied with current treatment? yes Duration of hypertension: chronic BP monitoring frequency: every few days BP range: <130/80 on average BP medication side effects: no Duration of hyperlipidemia: chronic Cholesterol medication side effects: no Cholesterol supplements: none Medication compliance: good compliance Aspirin : yes Recent stressors: no Recurrent headaches:  no Visual changes: no Palpitations: no Dyspnea: no Chest pain: no Lower extremity edema: a little at times Dizzy/lightheaded: no   COPD Uses Singulair  and Albuterol . Lung cancer screening 08/10/24 with aortic atherosclerosis, emphysema, left adrenal adenoma, gallstones, and enlarged pulmonic truck.  Adrenal adenoma chronically present on imaging, measures 2.2 cm and suspected to be lipid poor adenoma.    Has not smoked in > 8 years since bypass surgery. COPD status: stable Satisfied with current treatment?: yes Oxygen  use: no Limitation of activity: no Pneumovax: Up to Date Influenza: Up to Date      10/16/2024    1:23 PM 07/15/2024    2:40 PM 04/14/2024    2:02 PM 01/14/2024    2:27 PM 11/26/2023    3:45 PM  Depression screen PHQ 2/9  Decreased Interest 0 0 0 0 0  Down, Depressed, Hopeless 0 0 0 0 0  PHQ - 2 Score 0 0 0 0 0  Altered sleeping 0 0 0 0 0  Tired, decreased energy 0 1 0 0 0  Change in appetite 0 0 0 0 0  Feeling bad or failure about yourself  0 0 0 0 0  Trouble concentrating 0 0 0 0 0  Moving slowly or fidgety/restless 0 0 0 0 0  Suicidal thoughts 0 0 0 0 0  PHQ-9 Score 0 1 0 0 0  Difficult doing work/chores Not difficult at all Not difficult at all Not difficult at all Not difficult at all Not difficult at all  07/15/2024    2:41 PM 04/14/2024    2:02 PM 01/14/2024    2:28 PM 10/14/2023    1:55 PM  GAD 7 : Generalized Anxiety Score  Nervous, Anxious, on Edge 0 0 0 0  Control/stop worrying 0 0 0 0  Worry too much - different things 1 0 0 0  Trouble relaxing 0 0 0 0  Restless 0 0 0 0  Easily annoyed or irritable 0 0 0 0  Afraid - awful might happen 0 0 0 0  Total GAD 7 Score 1 0 0 0  Anxiety Difficulty Not difficult at all Not difficult at all Not difficult at all    Relevant past medical, surgical, family and social history reviewed and updated as indicated. Interim medical history since our last visit reviewed. Allergies and medications reviewed  and updated.  Review of Systems  Constitutional:  Negative for activity change, diaphoresis, fatigue and fever.  Respiratory:  Negative for cough, chest tightness, shortness of breath and wheezing.   Cardiovascular:  Negative for chest pain, palpitations and leg swelling.  Gastrointestinal: Negative.   Endocrine: Negative for cold intolerance, heat intolerance, polydipsia, polyphagia and polyuria.  Neurological: Negative.   Psychiatric/Behavioral: Negative.     Per HPI unless specifically indicated above     Objective:    BP 117/72   Pulse 74   Temp 98.3 F (36.8 C) (Oral)   Ht 6' 2.2 (1.885 m)   Wt 193 lb 6.4 oz (87.7 kg)   SpO2 97%   BMI 24.70 kg/m   Wt Readings from Last 3 Encounters:  10/16/24 193 lb 6.4 oz (87.7 kg)  10/13/24 196 lb (88.9 kg)  09/30/24 197 lb (89.4 kg)    Physical Exam Vitals and nursing note reviewed.  Constitutional:      General: He is awake. He is not in acute distress.    Appearance: Normal appearance. He is well-developed and well-groomed. He is not ill-appearing or toxic-appearing.  HENT:     Head: Normocephalic.     Right Ear: Hearing and external ear normal.     Left Ear: Hearing and external ear normal.  Eyes:     General: Lids are normal.     Extraocular Movements: Extraocular movements intact.     Conjunctiva/sclera: Conjunctivae normal.  Neck:     Thyroid : No thyromegaly.     Vascular: No carotid bruit.  Cardiovascular:     Rate and Rhythm: Normal rate and regular rhythm.     Heart sounds: Normal heart sounds. No murmur heard.    No gallop.  Pulmonary:     Effort: No accessory muscle usage or respiratory distress.     Breath sounds: Normal breath sounds.  Abdominal:     General: Bowel sounds are normal. There is no distension.     Palpations: Abdomen is soft.     Tenderness: There is no abdominal tenderness.  Musculoskeletal:     Cervical back: Full passive range of motion without pain.     Right lower leg: No edema.      Left lower leg: No edema.  Lymphadenopathy:     Cervical: No cervical adenopathy.  Skin:    General: Skin is warm.     Capillary Refill: Capillary refill takes less than 2 seconds.  Neurological:     Mental Status: He is alert and oriented to person, place, and time.     Deep Tendon Reflexes: Reflexes are normal and symmetric.     Reflex Scores:  Brachioradialis reflexes are 2+ on the right side and 2+ on the left side.      Patellar reflexes are 2+ on the right side and 2+ on the left side. Psychiatric:        Attention and Perception: Attention normal.        Mood and Affect: Mood normal.        Speech: Speech normal.        Behavior: Behavior normal. Behavior is cooperative.        Thought Content: Thought content normal.    Results for orders placed or performed during the hospital encounter of 10/13/24  BUN   Collection Time: 10/13/24 11:32 AM  Result Value Ref Range   BUN 24 (H) 8 - 23 mg/dL  Creatinine, serum   Collection Time: 10/13/24 11:32 AM  Result Value Ref Range   Creatinine, Ser 1.43 (H) 0.61 - 1.24 mg/dL   GFR, Estimated 52 (L) >60 mL/min  Glucose, capillary   Collection Time: 10/13/24 11:38 AM  Result Value Ref Range   Glucose-Capillary 142 (H) 70 - 99 mg/dL  Glucose, capillary   Collection Time: 10/13/24  2:43 PM  Result Value Ref Range   Glucose-Capillary 122 (H) 70 - 99 mg/dL      Assessment & Plan:   Problem List Items Addressed This Visit       Cardiovascular and Mediastinum   Chronic heart failure with preserved ejection fraction (HFpEF) (HCC) (Chronic)   Chronic, stable.  Euvolemic on exam today.  Continue collaboration with cardiology and current medication regimen as ordered by them, recent notes and testing reviewed.  On Farxiga  for prevention.  Recommend: - Reminded to call for an overnight weight gain of >2 pounds or a weekly weight weight of >5 pounds - not adding salt to his food and has been reading food labels. Reviewed the  importance of keeping daily sodium intake to 2000mg  daily  - Avoid Ibuprofen medications      Relevant Medications   amLODipine  (NORVASC ) 5 MG tablet   atorvastatin  (LIPITOR) 20 MG tablet   chlorthalidone  (HYGROTON ) 25 MG tablet   PAD (peripheral artery disease)   Chronic, stable.  Continue statin and ASA daily at home for prevention + collaboration with podiatry and vascular. Recent notes reviewed.      Relevant Medications   amLODipine  (NORVASC ) 5 MG tablet   atorvastatin  (LIPITOR) 20 MG tablet   chlorthalidone  (HYGROTON ) 25 MG tablet   Hypertension associated with diabetes (HCC)   Chronic, stable.  BP well below goal in office and at home.  Continue current medication regimen and adjust as needed.  Continue collaboration with cardiology, recent notes reviewed.  Recommend he monitor BP at least a few times a week at home and document.  DASH diet focus.  LABS: CMP.  Urine ALB 10 January 2024      Relevant Medications   amLODipine  (NORVASC ) 5 MG tablet   atorvastatin  (LIPITOR) 20 MG tablet   chlorthalidone  (HYGROTON ) 25 MG tablet   dapagliflozin  propanediol (FARXIGA ) 10 MG TABS tablet   sitaGLIPtin  (JANUVIA ) 100 MG tablet   Other Relevant Orders   Bayer DCA Hb A1c Waived   Enlarged pulmonary artery (HCC)   Noted on CT lung screening. He is agreeable to pulmonary referral next visit for further assessment of possible pulmonary hypertension.      Relevant Medications   amLODipine  (NORVASC ) 5 MG tablet   atorvastatin  (LIPITOR) 20 MG tablet   chlorthalidone  (HYGROTON ) 25 MG tablet  Respiratory   Centrilobular emphysema (HCC) (Chronic)   Chronic, stable.  April 2024 - FEV1 54% and FEV1/FVC 59%, change from in 2022 was FEV1 64% and FEV1/FVC 73%.  Continue Albuterol  and recommend he use this if wheezing present or SOB.  Continue cessation of smoking and continue yearly lung screening.  Spirometry annually.  Have recommended a daily inhaler for maintenance and to help lung  function, he refuses. Continue annual lung screening.  He is agreeable to pulmonary referral next visit for further assessment of possible pulmonary hypertension.      Relevant Medications   montelukast  (SINGULAIR ) 10 MG tablet     Endocrine   Hyperlipidemia associated with type 2 diabetes mellitus (HCC) (Chronic)   Chronic, ongoing.  Continue current medication regimen and adjust as needed.  Lipid panel today.  Recent LDL <70.      Relevant Medications   amLODipine  (NORVASC ) 5 MG tablet   atorvastatin  (LIPITOR) 20 MG tablet   chlorthalidone  (HYGROTON ) 25 MG tablet   dapagliflozin  propanediol (FARXIGA ) 10 MG TABS tablet   sitaGLIPtin  (JANUVIA ) 100 MG tablet   Other Relevant Orders   Bayer DCA Hb A1c Waived   Comprehensive metabolic panel with GFR   Lipid Panel w/o Chol/HDL Ratio   Controlled type 2 diabetes with neuropathy (HCC) - Primary (Chronic)   Chronic, ongoing.  A1c 6.5% today, trend down from 6.7%. Urine ALB 10 January 2024. Continue Metformin , Farxiga , and Januvia .  Adjust Metformin  or Januvia  based on kidney function as needed.  Recommend heavy focus on diet with less pasta and potatoes. Continue Gabapentin  as prescribed by podiatry and adjust as needed based on renal function.  Check BS BID and document for next visit.   - Eye and foot exams up to date - Vaccinations up to date - Statin on board, allergic to ACE      Relevant Medications   atorvastatin  (LIPITOR) 20 MG tablet   dapagliflozin  propanediol (FARXIGA ) 10 MG TABS tablet   sitaGLIPtin  (JANUVIA ) 100 MG tablet   Other Relevant Orders   Bayer DCA Hb A1c Waived   Diabetes mellitus treated with oral medication (HCC)   Refer to diabetes with neuropathy plan of care.      Relevant Medications   atorvastatin  (LIPITOR) 20 MG tablet   dapagliflozin  propanediol (FARXIGA ) 10 MG TABS tablet   sitaGLIPtin  (JANUVIA ) 100 MG tablet   Other Relevant Orders   Bayer DCA Hb A1c Waived   Other Visit Diagnoses       Flu  vaccine need       Flu vaccine today, educated patient.   Relevant Orders   Flu vaccine HIGH DOSE PF(Fluzone Trivalent) (Completed)        Follow up plan: Return in about 3 months (around 01/16/2025) for T2DM, HTN/HLD, COPD.

## 2024-10-16 NOTE — Assessment & Plan Note (Signed)
 Chronic, stable.  BP well below goal in office and at home.  Continue current medication regimen and adjust as needed.  Continue collaboration with cardiology, recent notes reviewed.  Recommend he monitor BP at least a few times a week at home and document.  DASH diet focus.  LABS: CMP.  Urine ALB 10 January 2024

## 2024-10-16 NOTE — Assessment & Plan Note (Signed)
Chronic, stable.  Euvolemic on exam today.  Continue collaboration with cardiology and current medication regimen as ordered by them, recent notes and testing reviewed.  On Farxiga for prevention.  Recommend: - Reminded to call for an overnight weight gain of >2 pounds or a weekly weight weight of >5 pounds - not adding salt to his food and has been reading food labels. Reviewed the importance of keeping daily sodium intake to 2000mg  daily  - Avoid Ibuprofen medications

## 2024-10-16 NOTE — Assessment & Plan Note (Signed)
 Chronic, ongoing.  A1c 6.5% today, trend down from 6.7%. Urine ALB 10 January 2024. Continue Metformin , Farxiga , and Januvia .  Adjust Metformin  or Januvia  based on kidney function as needed.  Recommend heavy focus on diet with less pasta and potatoes. Continue Gabapentin  as prescribed by podiatry and adjust as needed based on renal function.  Check BS BID and document for next visit.   - Eye and foot exams up to date - Vaccinations up to date - Statin on board, allergic to ACE

## 2024-10-16 NOTE — Assessment & Plan Note (Signed)
 Chronic, stable.  Continue statin and ASA daily at home for prevention + collaboration with podiatry and vascular. Recent notes reviewed.

## 2024-10-16 NOTE — Assessment & Plan Note (Signed)
Chronic, ongoing.  Continue current medication regimen and adjust as needed.  Lipid panel today.  Recent LDL <70.

## 2024-10-17 LAB — COMPREHENSIVE METABOLIC PANEL WITH GFR
ALT: 14 IU/L (ref 0–44)
AST: 14 IU/L (ref 0–40)
Albumin: 4.9 g/dL — ABNORMAL HIGH (ref 3.8–4.8)
Alkaline Phosphatase: 79 IU/L (ref 47–123)
BUN/Creatinine Ratio: 16 (ref 10–24)
BUN: 25 mg/dL (ref 8–27)
Bilirubin Total: 0.5 mg/dL (ref 0.0–1.2)
CO2: 24 mmol/L (ref 20–29)
Calcium: 9.8 mg/dL (ref 8.6–10.2)
Chloride: 104 mmol/L (ref 96–106)
Creatinine, Ser: 1.58 mg/dL — ABNORMAL HIGH (ref 0.76–1.27)
Globulin, Total: 2 g/dL (ref 1.5–4.5)
Glucose: 115 mg/dL — ABNORMAL HIGH (ref 70–99)
Potassium: 3.6 mmol/L (ref 3.5–5.2)
Sodium: 146 mmol/L — ABNORMAL HIGH (ref 134–144)
Total Protein: 6.9 g/dL (ref 6.0–8.5)
eGFR: 46 mL/min/1.73 — ABNORMAL LOW (ref >59)

## 2024-10-17 LAB — LIPID PANEL W/O CHOL/HDL RATIO
Cholesterol, Total: 128 mg/dL (ref 100–199)
HDL: 39 mg/dL — ABNORMAL LOW (ref >39)
LDL Chol Calc (NIH): 64 mg/dL (ref 0–99)
Triglycerides: 141 mg/dL (ref 0–149)
VLDL Cholesterol Cal: 25 mg/dL (ref 5–40)

## 2024-10-18 ENCOUNTER — Ambulatory Visit: Payer: Self-pay | Admitting: Nurse Practitioner

## 2024-10-18 NOTE — Progress Notes (Signed)
 Good morning, please let Shady know his labs have returned: - Lipid panel continues to show stable levels, continue statin therapy. - Kidney function continues to show Stage 3a kidney disease. We will continue to monitor this closely at visits. Sodium, salt, is a little high.  Try to cut back on any salt in diet. Remainder of labs are stable.  Any questions? Keep being amazing!!  Thank you for allowing me to participate in your care.  I appreciate you. Kindest regards, [place] Want

## 2024-10-20 ENCOUNTER — Encounter: Payer: Self-pay | Admitting: Vascular Surgery

## 2024-10-20 ENCOUNTER — Encounter
Admission: RE | Disposition: A | Discharge status: Home / Self Care | Payer: Self-pay | Source: Home / Self Care | Attending: Vascular Surgery

## 2024-10-20 ENCOUNTER — Other Ambulatory Visit: Payer: Self-pay

## 2024-10-20 ENCOUNTER — Ambulatory Visit
Admission: RE | Admit: 2024-10-20 | Discharge: 2024-10-20 | Disposition: A | Discharge status: Home / Self Care | Attending: Vascular Surgery | Admitting: Vascular Surgery

## 2024-10-20 DIAGNOSIS — I70221 Atherosclerosis of native arteries of extremities with rest pain, right leg: Secondary | ICD-10-CM

## 2024-10-20 DIAGNOSIS — I70223 Atherosclerosis of native arteries of extremities with rest pain, bilateral legs: Secondary | ICD-10-CM | POA: Diagnosis not present

## 2024-10-20 DIAGNOSIS — Z9889 Other specified postprocedural states: Secondary | ICD-10-CM | POA: Diagnosis not present

## 2024-10-20 DIAGNOSIS — I70229 Atherosclerosis of native arteries of extremities with rest pain, unspecified extremity: Secondary | ICD-10-CM | POA: Diagnosis present

## 2024-10-20 DIAGNOSIS — E1169 Type 2 diabetes mellitus with other specified complication: Secondary | ICD-10-CM | POA: Diagnosis not present

## 2024-10-20 DIAGNOSIS — E7849 Other hyperlipidemia: Secondary | ICD-10-CM | POA: Insufficient documentation

## 2024-10-20 DIAGNOSIS — Z87891 Personal history of nicotine dependence: Secondary | ICD-10-CM | POA: Insufficient documentation

## 2024-10-20 DIAGNOSIS — E1151 Type 2 diabetes mellitus with diabetic peripheral angiopathy without gangrene: Secondary | ICD-10-CM | POA: Diagnosis not present

## 2024-10-20 DIAGNOSIS — Z7984 Long term (current) use of oral hypoglycemic drugs: Secondary | ICD-10-CM | POA: Diagnosis not present

## 2024-10-20 HISTORY — DX: Peripheral vascular disease, unspecified: I73.9

## 2024-10-20 HISTORY — PX: LOWER EXTREMITY ANGIOGRAPHY: CATH118251

## 2024-10-20 LAB — CREATININE, SERUM
Creatinine, Ser: 1.54 mg/dL — ABNORMAL HIGH (ref 0.61–1.24)
GFR, Estimated: 47 mL/min — ABNORMAL LOW (ref >60)

## 2024-10-20 LAB — GLUCOSE, CAPILLARY
Glucose-Capillary: 204 mg/dL — ABNORMAL HIGH (ref 70–99)
Glucose-Capillary: 164 mg/dL — ABNORMAL HIGH (ref 70–99)

## 2024-10-20 LAB — BUN: BUN: 26 mg/dL — ABNORMAL HIGH (ref 8–23)

## 2024-10-20 SURGERY — LOWER EXTREMITY ANGIOGRAPHY
Anesthesia: Moderate Sedation | Site: Leg Lower | Laterality: Right

## 2024-10-20 MED ORDER — CEFAZOLIN SODIUM-DEXTROSE 2-4 GM/100ML-% IV SOLN
INTRAVENOUS | Status: AC
  Filled 2024-10-20: qty 100

## 2024-10-20 MED ORDER — SODIUM CHLORIDE 0.9 % IV SOLN: INTRAVENOUS | Status: DC

## 2024-10-20 MED ORDER — HEPARIN (PORCINE) IN NACL 1000-0.9 UT/500ML-% IV SOLN
INTRAVENOUS | Status: DC | PRN
  Administered 2024-10-20: 1000 mL

## 2024-10-20 MED ORDER — HEPARIN SODIUM (PORCINE) 1000 UNIT/ML IJ SOLN
INTRAMUSCULAR | Status: AC
  Filled 2024-10-20: qty 10

## 2024-10-20 MED ORDER — CEFAZOLIN SODIUM-DEXTROSE 2-4 GM/100ML-% IV SOLN
2.0000 g | INTRAVENOUS | Status: AC
  Administered 2024-10-20: 2 g via INTRAVENOUS

## 2024-10-20 MED ORDER — SODIUM CHLORIDE 0.9% FLUSH: 3.0000 mL | INTRAVENOUS | Status: DC | PRN

## 2024-10-20 MED ORDER — MIDAZOLAM HCL 2 MG/2ML IJ SOLN
INTRAMUSCULAR | Status: AC
  Filled 2024-10-20: qty 2

## 2024-10-20 MED ORDER — HYDROMORPHONE HCL 1 MG/ML IJ SOLN: 1.0000 mg | Freq: Once | INTRAMUSCULAR | Status: DC | PRN

## 2024-10-20 MED ORDER — ONDANSETRON HCL 4 MG/2ML IJ SOLN: 4.0000 mg | Freq: Four times a day (QID) | INTRAMUSCULAR | Status: DC | PRN

## 2024-10-20 MED ORDER — METHYLPREDNISOLONE SODIUM SUCC 125 MG IJ SOLR: 125.0000 mg | Freq: Once | INTRAMUSCULAR | Status: DC | PRN

## 2024-10-20 MED ORDER — FENTANYL CITRATE (PF) 100 MCG/2ML IJ SOLN
INTRAMUSCULAR | Status: AC
  Filled 2024-10-20: qty 2

## 2024-10-20 MED ORDER — HEPARIN SODIUM (PORCINE) 1000 UNIT/ML IJ SOLN
INTRAMUSCULAR | Status: DC | PRN
  Administered 2024-10-20: 6000 [IU] via INTRAVENOUS

## 2024-10-20 MED ORDER — LABETALOL HCL 5 MG/ML IV SOLN: 10.0000 mg | INTRAVENOUS | Status: DC | PRN

## 2024-10-20 MED ORDER — OXYCODONE HCL 5 MG PO TABS: 5.0000 mg | ORAL_TABLET | ORAL | Status: DC | PRN

## 2024-10-20 MED ORDER — DIPHENHYDRAMINE HCL 50 MG/ML IJ SOLN: 50.0000 mg | Freq: Once | INTRAMUSCULAR | Status: DC | PRN

## 2024-10-20 MED ORDER — IODIXANOL 320 MG/ML IV SOLN
INTRAVENOUS | Status: DC | PRN
  Administered 2024-10-20: 45 mL via INTRA_ARTERIAL

## 2024-10-20 MED ORDER — HYDRALAZINE HCL 20 MG/ML IJ SOLN: 5.0000 mg | INTRAMUSCULAR | Status: DC | PRN

## 2024-10-20 MED ORDER — ACETAMINOPHEN 325 MG PO TABS: 650.0000 mg | ORAL_TABLET | ORAL | Status: DC | PRN

## 2024-10-20 MED ORDER — LIDOCAINE HCL (PF) 1 % IJ SOLN
INTRAMUSCULAR | Status: DC | PRN
  Administered 2024-10-20: 10 mL via INTRADERMAL

## 2024-10-20 MED ORDER — SODIUM CHLORIDE 0.9% FLUSH: 3.0000 mL | Freq: Two times a day (BID) | INTRAVENOUS | Status: DC

## 2024-10-20 MED ORDER — SODIUM CHLORIDE 0.9 % IV SOLN: 250.0000 mL | INTRAVENOUS | Status: DC | PRN

## 2024-10-20 MED ORDER — FAMOTIDINE 20 MG PO TABS: 40.0000 mg | ORAL_TABLET | Freq: Once | ORAL | Status: DC | PRN

## 2024-10-20 MED ORDER — MORPHINE SULFATE (PF) 2 MG/ML IV SOLN: 2.0000 mg | INTRAVENOUS | Status: DC | PRN

## 2024-10-20 MED ORDER — MIDAZOLAM HCL 2 MG/ML PO SYRP: 8.0000 mg | ORAL_SOLUTION | Freq: Once | ORAL | Status: DC | PRN

## 2024-10-20 MED ORDER — FENTANYL CITRATE (PF) 100 MCG/2ML IJ SOLN
INTRAMUSCULAR | Status: DC | PRN
  Administered 2024-10-20: 25 ug via INTRAVENOUS
  Administered 2024-10-20: 50 ug via INTRAVENOUS
  Administered 2024-10-20 (×2): 25 ug via INTRAVENOUS
  Administered 2024-10-20: 50 ug via INTRAVENOUS

## 2024-10-20 MED ORDER — MIDAZOLAM HCL (PF) 2 MG/2ML IJ SOLN
INTRAMUSCULAR | Status: DC | PRN
Start: 2024-10-20 — End: 2024-10-20
  Administered 2024-10-20 (×2): .5 mg via INTRAVENOUS
  Administered 2024-10-20: 1 mg via INTRAVENOUS
  Administered 2024-10-20: 2 mg via INTRAVENOUS

## 2024-10-20 SURGICAL SUPPLY — 21 items
BALLOON JADE .014 3.0 X 20 (BALLOONS) IMPLANT
BALLOON JADE .014 3.5 X 40 (BALLOONS) IMPLANT
BALLOON JADE .014 4.0 X 40 0 (BALLOONS) IMPLANT
BALLOON LUTONIX 4X220X130 (BALLOONS) IMPLANT
BALLOON LUTONIX 6X220X130 (BALLOONS) IMPLANT
CATH TEMPO 5F RIM 65CM (CATHETERS) IMPLANT
CATH VERT 5FR 125CM (CATHETERS) IMPLANT
COVER PROBE ULTRASOUND 5X96 (MISCELLANEOUS) IMPLANT
DEVICE PRESTO INFLATION (MISCELLANEOUS) IMPLANT
DEVICE STARCLOSE SE CLOSURE (Vascular Products) IMPLANT
GLIDEWIRE ADV .014X300CM (WIRE) IMPLANT
GLIDEWIRE ADV .035X260CM (WIRE) IMPLANT
GOWN STRL REUS W/ TWL LRG LVL3 (GOWN DISPOSABLE) ×1 IMPLANT
LIFESTENT SOLO 7X200X135 (Permanent Stent) IMPLANT
NDL ENTRY 21GA 7CM ECHOTIP (NEEDLE) IMPLANT
NEEDLE ENTRY 21GA 7CM ECHOTIP (NEEDLE) ×1 IMPLANT
PACK ANGIOGRAPHY (CUSTOM PROCEDURE TRAY) ×1 IMPLANT
SET INTRO CAPELLA COAXIAL (SET/KITS/TRAYS/PACK) IMPLANT
SHEATH BRITE TIP 5FRX11 (SHEATH) IMPLANT
SHEATH RAABE 6FR (SHEATH) IMPLANT
STENT OTW ESPRIT BTK 3.75X38 (Permanent Stent) IMPLANT

## 2024-10-20 NOTE — Progress Notes (Signed)
 MRN : [id number]  Brendan [name] is a 73 y.o. (1951-04-17) male who presents with chief complaint of check circulation.  History of Present Illness:   Patient presents to [facility] for treatment of his lower extremity atherosclerotic occlusive disease.  Recently he underwent successful left lower extremity angiography with intervention.  He now returns for treatment of his right lower extremity.  Current Meds  Medication Sig   acetaminophen  (TYLENOL ) 500 MG tablet Take 1 tablet (500 mg total) by mouth every 6 (six) hours as needed for mild pain or headache.   albuterol  (VENTOLIN  HFA) 108 (90 Base) MCG/ACT inhaler Inhale 2 puffs into the lungs every 6 (six) hours as needed for wheezing or shortness of breath.   amLODipine  (NORVASC ) 5 MG tablet Take 1 tablet (5 mg total) by mouth daily.   aspirin  EC 81 MG tablet Take 1 tablet (81 mg total) by mouth daily.   atorvastatin  (LIPITOR) 20 MG tablet Take 1 tablet (20 mg total) by mouth daily.   azelastine  (ASTELIN ) 0.1 % nasal spray Place 2 sprays into both nostrils at bedtime.   chlorthalidone  (HYGROTON ) 25 MG tablet Take 1 tablet (25 mg total) by mouth daily.   clopidogrel (PLAVIX) 75 MG tablet Take 1 tablet (75 mg total) by mouth daily.   cyanocobalamin  1000 MCG tablet Take 1,000 mcg by mouth every other day.   dapagliflozin  propanediol (FARXIGA ) 10 MG TABS tablet Take 1 tablet (10 mg total) by mouth daily before breakfast.   gabapentin  (NEURONTIN ) 300 MG capsule Take one capsule at lunchtime and two capsules at bedtime   lidocaine  (LIDODERM ) 5 % Place 1 patch onto the skin daily. Remove & Discard patch within 12 hours or as directed by MD   loratadine  (CLARITIN ) 10 MG tablet Take 1 tablet (10 mg total) by mouth daily.   metFORMIN  (GLUCOPHAGE ) 1000 MG tablet Take 1 tablet (1,000 mg total) by mouth 2 (two) times daily with a meal.   metoprolol  tartrate  (LOPRESSOR ) 25 MG tablet Take 0.5 tablets (12.5 mg total) by mouth 2 (two) times daily.   montelukast  (SINGULAIR ) 10 MG tablet Take 1 tablet (10 mg total) by mouth at bedtime.   potassium chloride  SA (KLOR-CON  M) 20 MEQ tablet Take 2 tablets (40 meq) by mouth once daily   sitaGLIPtin  (JANUVIA ) 100 MG tablet Take 1 tablet (100 mg total) by mouth daily.   tamsulosin  (FLOMAX ) 0.4 MG CAPS capsule Take 1 capsule (0.4 mg total) by mouth daily.    Past Medical History:  Diagnosis Date   CAD (coronary artery disease)    a. 05/2016 Lexi MV: EF 58%, no ischemia/infarct; b. 05/2017 Cath: LM 20-30, LAD 50p, 22m, D1 50p, D2 80ost, LCX 90/70ost, RCA 80/90p/m, RPL1 50p, EF 50-55%; c. 06/2017 CABG x 4 (LIMA->LAD, VG->D2,VG->OM, VG->PDA).   Diabetes mellitus without complication (HCC)    Diastolic dysfunction    a. 05/2016 Echo: EF 55%, Gr1 DD, mild TR/MR.   Hyperlipidemia    Hypertension    Hypokalemia    Peripheral arterial disease    Personal history of tobacco use, presenting hazards to  health 05/03/2016   Post-op Afib    a. 06/2017 following CABG->short course of amio.    Past Surgical History:  Procedure Laterality Date   CARDIAC CATHETERIZATION     COLONOSCOPY     COLONOSCOPY N/A 04/21/2024   Procedure: COLONOSCOPY;  Surgeon: Jinny Carmine, MD;  Location: [facility] ENDOSCOPY;  Service: Endoscopy;  Laterality: N/A;   CORONARY ARTERY BYPASS GRAFT N/A 07/01/2017   Procedure: CORONARY ARTERY BYPASS GRAFTING (CABG) x4, using left mammary artery and right greater saphenous vein harvested endoscopically;  Surgeon: Fleeta Hanford Coy, MD;  Location: [facility] OR;  Service: Open Heart Surgery;  Laterality: N/A;   KNEE SURGERY     LEFT HEART CATH AND CORONARY ANGIOGRAPHY N/A 06/04/2017   Procedure: Left Heart Cath and Coronary Angiography;  Surgeon: Mady Bruckner, MD;  Location: [facility] INVASIVE CV LAB;  Service: Cardiovascular;  Laterality: N/A;   LOWER EXTREMITY ANGIOGRAPHY Left 10/13/2024   Procedure: Lower Extremity  Angiography;  Surgeon: Jama Cordella MATSU, MD;  Location: [facility] INVASIVE CV LAB;  Service: Cardiovascular;  Laterality: Left;   TEE WITHOUT CARDIOVERSION N/A 07/01/2017   Procedure: TRANSESOPHAGEAL ECHOCARDIOGRAM (TEE);  Surgeon: Fleeta Hanford, Coy, MD;  Location: [facility] OR;  Service: Open Heart Surgery;  Laterality: N/A;    Social History Social History   Tobacco Use   Smoking status: Former    Current packs/day: 0.00    Average packs/day: 1 pack/day for 60.0 years (60.0 ttl pk-yrs)    Types: Cigarettes    Start date: 06/29/1957    Quit date: 06/29/2017    Years since quitting: 7.3   Smokeless tobacco: Never  Vaping Use   Vaping status: Never Used  Substance Use Topics   Alcohol use: No    Comment: previously drank heavily but quit ~ 21 yrs ago.   Drug use: No    Family History Family History  Problem Relation Age of Onset   Hypertension Mother    Diabetes Mother    Heart disease Father 77       multiple MI's    Allergies  Allergen Reactions   Lisinopril  Other (See Comments)    Fatigue and ED     REVIEW OF SYSTEMS (Negative unless checked)  Constitutional: [] Weight loss  [] Fever  [] Chills Cardiac: [] Chest pain   [] Chest pressure   [] Palpitations   [] Shortness of breath when laying flat   [] Shortness of breath with exertion. Vascular:  [x] Pain in legs with walking   [] Pain in legs at rest  [] History of DVT   [] Phlebitis   [] Swelling in legs   [] Varicose veins   [] Non-healing ulcers Pulmonary:   [] Uses home oxygen    [] Productive cough   [] Hemoptysis   [] Wheeze  [] COPD   [] Asthma Neurologic:  [] Dizziness   [] Seizures   [] History of stroke   [] History of TIA  [] Aphasia   [] Vissual changes   [] Weakness or numbness in arm   [] Weakness or numbness in leg Musculoskeletal:   [] Joint swelling   [] Joint pain   [] Low back pain Hematologic:  [] Easy bruising  [] Easy bleeding   [] Hypercoagulable state   [] Anemic Gastrointestinal:  [] Diarrhea   [] Vomiting  [] Gastroesophageal  reflux/heartburn   [] Difficulty swallowing. Genitourinary:  [] Chronic kidney disease   [] Difficult urination  [] Frequent urination   [] Blood in urine Skin:  [] Rashes   [] Ulcers  Psychological:  [] History of anxiety   []  History of major depression.  Physical Examination  Vitals:   10/20/24 0700  BP: 138/76  Pulse: 73  Resp: 15  Temp: (!)  96.9 F (36.1 C)  TempSrc: Temporal  SpO2: 98%  Weight: 87.7 kg  Height: 6' 2.25 (1.886 m)   Body mass index is 24.66 kg/m. Gen: WD/WN, NAD Head: [place]/AT, No temporalis wasting.  Ear/Nose/Throat: Hearing grossly intact, nares w/o erythema or drainage Eyes: PER, EOMI, sclera nonicteric.  Neck: Supple, no masses.  No bruit or JVD.  Pulmonary:  Good air movement, no audible wheezing, no use of accessory muscles.  Cardiac: RRR, normal S1, S2, no Murmurs. Vascular:  mild trophic changes, no open wounds Vessel Right Left  Radial Palpable Palpable  PT Not Palpable Not Palpable  DP Not Palpable Not Palpable  Gastrointestinal: soft, non-distended. No guarding/no peritoneal signs.  Musculoskeletal: M/S 5/5 throughout.  No visible deformity.  Neurologic: CN 2-12 intact. Pain and light touch intact in extremities.  Symmetrical.  Speech is fluent. Motor exam as listed above. Psychiatric: Judgment intact, Mood & affect appropriate for pt's clinical situation. Dermatologic: No rashes or ulcers noted.  No changes consistent with cellulitis.   CBC Lab Results  Component Value Date   WBC 8.3 01/14/2024   HGB 16.6 01/14/2024   HCT 49.3 01/14/2024   MCV 91 01/14/2024   PLT 180 01/14/2024    BMET    Component Value Date/Time   NA 146 (H) 10/16/2024 1330   K 3.6 10/16/2024 1330   CL 104 10/16/2024 1330   CO2 24 10/16/2024 1330   GLUCOSE 115 (H) 10/16/2024 1330   GLUCOSE 191 (H) 05/28/2023 1633   BUN 26 (H) 10/20/2024 0659   BUN 25 10/16/2024 1330   CREATININE 1.54 (H) 10/20/2024 0659   CALCIUM  9.8 10/16/2024 1330   GFRNONAA 47 (L) 10/20/2024  0659   GFRAA 71 08/23/2020 1458   Estimated Creatinine Clearance: 50 mL/min (A) (by C-G formula based on SCr of 1.54 mg/dL (H)).  COAG Lab Results  Component Value Date   INR 1.10 06/18/2017   INR 1.0 05/30/2017    Radiology PERIPHERAL VASCULAR CATHETERIZATION Result Date: 10/13/2024 See surgical note for result.    Assessment/Plan 1. Atherosclerosis of native artery of both lower extremities with rest pain (HCC) (Primary) Recommend:   The patient has evidence of severe atherosclerotic changes of both lower extremities with rest pain that is associated with preulcerative changes and impending tissue loss of the bilateral foot.  This represents a limb threatening ischemia and places the patient at the risk for bilateral limb loss.   Patient should undergo angiography of the  lower extremity with the hope for intervention for limb salvage.  The risks and benefits as well as the alternative therapies was discussed in detail with the patient.  All questions were answered.  Patient agrees to proceed with right lower extremity angiography.   The patient will follow up with me in the office after the procedure.     2. Hyperlipidemia associated with type 2 diabetes mellitus (HCC) Continue statin as ordered and reviewed, no changes at this time   3. Diabetes mellitus treated with oral medication (HCC) Continue hypoglycemic medications as already ordered, these medications have been reviewed and there are no changes at this time.   Hgb A1C to be monitored as already arranged by primary service     Cordella Shawl, MD  10/20/2024 8:24 AM

## 2024-10-20 NOTE — H&P (View-Only) (Signed)
 MRN : [id number]  Brendan [name] is a 73 y.o. (1951-04-17) male who presents with chief complaint of check circulation.  History of Present Illness:   Patient presents to [facility] for treatment of his lower extremity atherosclerotic occlusive disease.  Recently he underwent successful left lower extremity angiography with intervention.  He now returns for treatment of his right lower extremity.  Current Meds  Medication Sig   acetaminophen  (TYLENOL ) 500 MG tablet Take 1 tablet (500 mg total) by mouth every 6 (six) hours as needed for mild pain or headache.   albuterol  (VENTOLIN  HFA) 108 (90 Base) MCG/ACT inhaler Inhale 2 puffs into the lungs every 6 (six) hours as needed for wheezing or shortness of breath.   amLODipine  (NORVASC ) 5 MG tablet Take 1 tablet (5 mg total) by mouth daily.   aspirin  EC 81 MG tablet Take 1 tablet (81 mg total) by mouth daily.   atorvastatin  (LIPITOR) 20 MG tablet Take 1 tablet (20 mg total) by mouth daily.   azelastine  (ASTELIN ) 0.1 % nasal spray Place 2 sprays into both nostrils at bedtime.   chlorthalidone  (HYGROTON ) 25 MG tablet Take 1 tablet (25 mg total) by mouth daily.   clopidogrel (PLAVIX) 75 MG tablet Take 1 tablet (75 mg total) by mouth daily.   cyanocobalamin  1000 MCG tablet Take 1,000 mcg by mouth every other day.   dapagliflozin  propanediol (FARXIGA ) 10 MG TABS tablet Take 1 tablet (10 mg total) by mouth daily before breakfast.   gabapentin  (NEURONTIN ) 300 MG capsule Take one capsule at lunchtime and two capsules at bedtime   lidocaine  (LIDODERM ) 5 % Place 1 patch onto the skin daily. Remove & Discard patch within 12 hours or as directed by MD   loratadine  (CLARITIN ) 10 MG tablet Take 1 tablet (10 mg total) by mouth daily.   metFORMIN  (GLUCOPHAGE ) 1000 MG tablet Take 1 tablet (1,000 mg total) by mouth 2 (two) times daily with a meal.   metoprolol  tartrate  (LOPRESSOR ) 25 MG tablet Take 0.5 tablets (12.5 mg total) by mouth 2 (two) times daily.   montelukast  (SINGULAIR ) 10 MG tablet Take 1 tablet (10 mg total) by mouth at bedtime.   potassium chloride  SA (KLOR-CON  M) 20 MEQ tablet Take 2 tablets (40 meq) by mouth once daily   sitaGLIPtin  (JANUVIA ) 100 MG tablet Take 1 tablet (100 mg total) by mouth daily.   tamsulosin  (FLOMAX ) 0.4 MG CAPS capsule Take 1 capsule (0.4 mg total) by mouth daily.    Past Medical History:  Diagnosis Date   CAD (coronary artery disease)    a. 05/2016 Lexi MV: EF 58%, no ischemia/infarct; b. 05/2017 Cath: LM 20-30, LAD 50p, 22m, D1 50p, D2 80ost, LCX 90/70ost, RCA 80/90p/m, RPL1 50p, EF 50-55%; c. 06/2017 CABG x 4 (LIMA->LAD, VG->D2,VG->OM, VG->PDA).   Diabetes mellitus without complication (HCC)    Diastolic dysfunction    a. 05/2016 Echo: EF 55%, Gr1 DD, mild TR/MR.   Hyperlipidemia    Hypertension    Hypokalemia    Peripheral arterial disease    Personal history of tobacco use, presenting hazards to  health 05/03/2016   Post-op Afib    a. 06/2017 following CABG->short course of amio.    Past Surgical History:  Procedure Laterality Date   CARDIAC CATHETERIZATION     COLONOSCOPY     COLONOSCOPY N/A 04/21/2024   Procedure: COLONOSCOPY;  Surgeon: Jinny Carmine, MD;  Location: [facility] ENDOSCOPY;  Service: Endoscopy;  Laterality: N/A;   CORONARY ARTERY BYPASS GRAFT N/A 07/01/2017   Procedure: CORONARY ARTERY BYPASS GRAFTING (CABG) x4, using left mammary artery and right greater saphenous vein harvested endoscopically;  Surgeon: Fleeta Hanford Coy, MD;  Location: [facility] OR;  Service: Open Heart Surgery;  Laterality: N/A;   KNEE SURGERY     LEFT HEART CATH AND CORONARY ANGIOGRAPHY N/A 06/04/2017   Procedure: Left Heart Cath and Coronary Angiography;  Surgeon: Mady Bruckner, MD;  Location: [facility] INVASIVE CV LAB;  Service: Cardiovascular;  Laterality: N/A;   LOWER EXTREMITY ANGIOGRAPHY Left 10/13/2024   Procedure: Lower Extremity  Angiography;  Surgeon: Jama Cordella MATSU, MD;  Location: [facility] INVASIVE CV LAB;  Service: Cardiovascular;  Laterality: Left;   TEE WITHOUT CARDIOVERSION N/A 07/01/2017   Procedure: TRANSESOPHAGEAL ECHOCARDIOGRAM (TEE);  Surgeon: Fleeta Hanford, Coy, MD;  Location: [facility] OR;  Service: Open Heart Surgery;  Laterality: N/A;    Social History Social History   Tobacco Use   Smoking status: Former    Current packs/day: 0.00    Average packs/day: 1 pack/day for 60.0 years (60.0 ttl pk-yrs)    Types: Cigarettes    Start date: 06/29/1957    Quit date: 06/29/2017    Years since quitting: 7.3   Smokeless tobacco: Never  Vaping Use   Vaping status: Never Used  Substance Use Topics   Alcohol use: No    Comment: previously drank heavily but quit ~ 21 yrs ago.   Drug use: No    Family History Family History  Problem Relation Age of Onset   Hypertension Mother    Diabetes Mother    Heart disease Father 77       multiple MI's    Allergies  Allergen Reactions   Lisinopril  Other (See Comments)    Fatigue and ED     REVIEW OF SYSTEMS (Negative unless checked)  Constitutional: [] Weight loss  [] Fever  [] Chills Cardiac: [] Chest pain   [] Chest pressure   [] Palpitations   [] Shortness of breath when laying flat   [] Shortness of breath with exertion. Vascular:  [x] Pain in legs with walking   [] Pain in legs at rest  [] History of DVT   [] Phlebitis   [] Swelling in legs   [] Varicose veins   [] Non-healing ulcers Pulmonary:   [] Uses home oxygen    [] Productive cough   [] Hemoptysis   [] Wheeze  [] COPD   [] Asthma Neurologic:  [] Dizziness   [] Seizures   [] History of stroke   [] History of TIA  [] Aphasia   [] Vissual changes   [] Weakness or numbness in arm   [] Weakness or numbness in leg Musculoskeletal:   [] Joint swelling   [] Joint pain   [] Low back pain Hematologic:  [] Easy bruising  [] Easy bleeding   [] Hypercoagulable state   [] Anemic Gastrointestinal:  [] Diarrhea   [] Vomiting  [] Gastroesophageal  reflux/heartburn   [] Difficulty swallowing. Genitourinary:  [] Chronic kidney disease   [] Difficult urination  [] Frequent urination   [] Blood in urine Skin:  [] Rashes   [] Ulcers  Psychological:  [] History of anxiety   []  History of major depression.  Physical Examination  Vitals:   10/20/24 0700  BP: 138/76  Pulse: 73  Resp: 15  Temp: (!)  96.9 F (36.1 C)  TempSrc: Temporal  SpO2: 98%  Weight: 87.7 kg  Height: 6' 2.25 (1.886 m)   Body mass index is 24.66 kg/m. Gen: WD/WN, NAD Head: [place]/AT, No temporalis wasting.  Ear/Nose/Throat: Hearing grossly intact, nares w/o erythema or drainage Eyes: PER, EOMI, sclera nonicteric.  Neck: Supple, no masses.  No bruit or JVD.  Pulmonary:  Good air movement, no audible wheezing, no use of accessory muscles.  Cardiac: RRR, normal S1, S2, no Murmurs. Vascular:  mild trophic changes, no open wounds Vessel Right Left  Radial Palpable Palpable  PT Not Palpable Not Palpable  DP Not Palpable Not Palpable  Gastrointestinal: soft, non-distended. No guarding/no peritoneal signs.  Musculoskeletal: M/S 5/5 throughout.  No visible deformity.  Neurologic: CN 2-12 intact. Pain and light touch intact in extremities.  Symmetrical.  Speech is fluent. Motor exam as listed above. Psychiatric: Judgment intact, Mood & affect appropriate for pt's clinical situation. Dermatologic: No rashes or ulcers noted.  No changes consistent with cellulitis.   CBC Lab Results  Component Value Date   WBC 8.3 01/14/2024   HGB 16.6 01/14/2024   HCT 49.3 01/14/2024   MCV 91 01/14/2024   PLT 180 01/14/2024    BMET    Component Value Date/Time   NA 146 (H) 10/16/2024 1330   K 3.6 10/16/2024 1330   CL 104 10/16/2024 1330   CO2 24 10/16/2024 1330   GLUCOSE 115 (H) 10/16/2024 1330   GLUCOSE 191 (H) 05/28/2023 1633   BUN 26 (H) 10/20/2024 0659   BUN 25 10/16/2024 1330   CREATININE 1.54 (H) 10/20/2024 0659   CALCIUM  9.8 10/16/2024 1330   GFRNONAA 47 (L) 10/20/2024  0659   GFRAA 71 08/23/2020 1458   Estimated Creatinine Clearance: 50 mL/min (A) (by C-G formula based on SCr of 1.54 mg/dL (H)).  COAG Lab Results  Component Value Date   INR 1.10 06/18/2017   INR 1.0 05/30/2017    Radiology PERIPHERAL VASCULAR CATHETERIZATION Result Date: 10/13/2024 See surgical note for result.    Assessment/Plan 1. Atherosclerosis of native artery of both lower extremities with rest pain (HCC) (Primary) Recommend:   The patient has evidence of severe atherosclerotic changes of both lower extremities with rest pain that is associated with preulcerative changes and impending tissue loss of the bilateral foot.  This represents a limb threatening ischemia and places the patient at the risk for bilateral limb loss.   Patient should undergo angiography of the  lower extremity with the hope for intervention for limb salvage.  The risks and benefits as well as the alternative therapies was discussed in detail with the patient.  All questions were answered.  Patient agrees to proceed with right lower extremity angiography.   The patient will follow up with me in the office after the procedure.     2. Hyperlipidemia associated with type 2 diabetes mellitus (HCC) Continue statin as ordered and reviewed, no changes at this time   3. Diabetes mellitus treated with oral medication (HCC) Continue hypoglycemic medications as already ordered, these medications have been reviewed and there are no changes at this time.   Hgb A1C to be monitored as already arranged by primary service     Cordella Shawl, MD  10/20/2024 8:24 AM

## 2024-10-20 NOTE — Interval H&P Note (Signed)
 History and Physical Interval Note:  10/20/2024 8:26 AM  Brendan [name]  has presented today for surgery, with the diagnosis of RLE Angio   ASO w rest pain.  The various methods of treatment have been discussed with the patient and family. After consideration of risks, benefits and other options for treatment, the patient has consented to  Procedure(s): Lower Extremity Angiography (Right) as a surgical intervention.  The patient's history has been reviewed, patient examined, no change in status, stable for surgery.  I have reviewed the patient's chart and labs.  Questions were answered to the patient's satisfaction.     Cordella Shawl

## 2024-10-20 NOTE — Op Note (Signed)
 [facility]  Percutaneous Study/Intervention Procedural Note   Date of Surgery: 10/20/2024  Surgeon:  Cordella Shawl  Pre-operative Diagnosis: Atherosclerotic occlusive disease bilateral lower extremities with right lower extremity with rest pain.  Post-operative diagnosis:  Same  Procedure(s) Performed:             1.  Introduction catheter into right lower extremity 3rd order catheter placement               2.    Contrast injection right lower extremity for distal runoff             3.  Percutaneous transluminal angioplasty and stent placement right superficial femoral and popliteal arteries to 6 mm             4.  Percutaneous transluminal angioplasty and stent placement right peroneal artery to 4 mm             5.  Star close closure left common femoral arteriotomy  Anesthesia: Conscious sedation was administered under my direct supervision by the interventional radiology RN. IV Versed  plus fentanyl  were utilized. Continuous ECG, pulse oximetry and blood pressure was monitored throughout the entire procedure.  Conscious sedation was for a total of 63 minutes.  Sheath: 6 [place] Rabie left common femoral retrograde  Contrast: 45 cc  Fluoroscopy Time: 9.3 minutes  Indications:  Brendan [name] presents with increasing pain of the right lower extremity.  This suggests the patient is having limb threatening ischemia. The risks and benefits are reviewed all questions answered patient agrees to proceed.  Procedure:   Brendan [name] is a 73 y.o. y.o. male who was identified and appropriate procedural time out was performed.  The patient was then placed supine on the table and prepped and draped in the usual sterile fashion.    Ultrasound was placed in the sterile sleeve and the left groin was evaluated the left common femoral artery was echolucent and pulsatile indicating patency.  Image was recorded for the permanent record and under real-time visualization a  microneedle was inserted into the common femoral artery followed by the microwire and then the micro-sheath.  A J-wire was then advanced through the micro-sheath and a  5 [place] sheath was then inserted over a J-wire.  Given his recent angiogram aortogram was not indicated at this time.  Subsequently a rib catheter with an Advantage wire was used to cross the aortic bifurcation.  The catheter and wire were advanced down into the right distal external iliac artery. Oblique view of the femoral bifurcation was then obtained and subsequently the wire was reintroduced and negotiated into the SFA   6000 units of heparin  was then given and allowed to circulate for several minutes.  A 6 French Rabie sheath was advanced up and over the bifurcation and positioned in the superficial femoral artery.  Distal runoff was then performed through hand-injection of contrast through the sheath.  The advantage Glidewire were then negotiated down into the distal popliteal.  A 4 mm x 220 mm Lutonix drug-eluting balloon was used to angioplasty the above-knee popliteal and mid to distal SFA.  The inflation was for 1 minute at approximately 10 atm. Follow-up imaging demonstrated patency with greater than 50% residual stenosis.  Therefore a 7 mm x 200 mm life stent was advanced across this lesion and deployed without difficulty.  It was postdilated with a 6 mm x 220 mm Lutonix drug-eluting balloon inflated to 10 atm for approximately 1 minute.  Follow-up imaging  demonstrated less than 10% residual stenosis throughout the entire SFA and popliteal.  The detector was then positioned distally and magnified imaging of the peroneal lesion was obtained.  A 3.5 mm x 40 mm balloon was advanced into the peroneal artery.  Inflation was to 12 atmospheres for 1 minute.  Next a 3.75 mm x 38 mm Esprit stent was advanced across this lesion and inflated to 12 atm.  Distally the stent did not profile and a 3 mm x 20 mm Jade balloon was advanced across  the distal segment and inflated to 24 atm at which point the balloon and stent did profile.  Lastly, a 4 mm x 40 mm Jade balloon was advanced across the length of the stent inflated to 8 atm for 30 seconds.  Follow-up imaging demonstrated patency with less than 10% residual stenosis. Distal runoff was then reassessed and noted to be widely patent.    After review of these images the sheath is pulled into the left external iliac oblique of the common femoral is obtained and a Star close device deployed. There no immediate Complications.  Findings:   Recent angiogram demonstrated diffuse disease of the abdominal aorta as well as the bilateral common and internal and external iliac arteries.  However there are no hemodynamically significant stenoses identified.   The right common femoral is patent with 40 to 50% stenosis in its midportion but this does not appear to be hemodynamically significant.  The profunda femoris diffusely diseased but there are no hemodynamically significant stenoses noted..  The SFA does indeed have a significant stenosis beginning in its midportion and extending through Hunter's canal into the above-knee popliteal.  There are multiple areas of hemodynamically significant's with 1 focal area of greater than 90%.  The mid and distal popliteal demonstrates diffuse disease but there are no hemodynamically significant stenoses.  The trifurcation is heavily diseased with occlusion of the anterior tibial and posterior tibial throughout their entire course from the origin distally.  Peroneal demonstrates a subtotal occlusion in its proximal portion of greater than 90% stenoses.  Following angioplasty and stent placement the peroneal now is in-line flow and looks quite nice with less than 10% residual stenosis. Angioplasty and stent placement of the SFA and popliteal arteries at Hunter's canal yields an excellent result with less than 10% residual stenosis.  Summary: Successful recanalization  right lower extremity for limb salvage                           Disposition: Patient was taken to the recovery room in stable condition having tolerated the procedure well.  Cordella Zilda No 10/20/2024,9:38 AM

## 2024-11-05 ENCOUNTER — Other Ambulatory Visit (INDEPENDENT_AMBULATORY_CARE_PROVIDER_SITE_OTHER): Payer: Self-pay | Admitting: Vascular Surgery

## 2024-11-05 DIAGNOSIS — Z9889 Other specified postprocedural states: Secondary | ICD-10-CM

## 2024-11-09 ENCOUNTER — Ambulatory Visit (INDEPENDENT_AMBULATORY_CARE_PROVIDER_SITE_OTHER)

## 2024-11-09 ENCOUNTER — Encounter (INDEPENDENT_AMBULATORY_CARE_PROVIDER_SITE_OTHER): Payer: Self-pay | Admitting: Vascular Surgery

## 2024-11-09 ENCOUNTER — Ambulatory Visit (INDEPENDENT_AMBULATORY_CARE_PROVIDER_SITE_OTHER): Admitting: Vascular Surgery

## 2024-11-09 VITALS — BP 122/76 | HR 72 | Resp 18 | Ht 75.0 in | Wt 200.0 lb

## 2024-11-09 DIAGNOSIS — J432 Centrilobular emphysema: Secondary | ICD-10-CM | POA: Diagnosis not present

## 2024-11-09 DIAGNOSIS — E1140 Type 2 diabetes mellitus with diabetic neuropathy, unspecified: Secondary | ICD-10-CM | POA: Diagnosis not present

## 2024-11-09 DIAGNOSIS — Z9889 Other specified postprocedural states: Secondary | ICD-10-CM

## 2024-11-09 DIAGNOSIS — I739 Peripheral vascular disease, unspecified: Secondary | ICD-10-CM | POA: Diagnosis not present

## 2024-11-09 DIAGNOSIS — E1159 Type 2 diabetes mellitus with other circulatory complications: Secondary | ICD-10-CM

## 2024-11-09 DIAGNOSIS — I152 Hypertension secondary to endocrine disorders: Secondary | ICD-10-CM

## 2024-11-09 DIAGNOSIS — I2510 Atherosclerotic heart disease of native coronary artery without angina pectoris: Secondary | ICD-10-CM | POA: Diagnosis not present

## 2024-11-09 DIAGNOSIS — I70213 Atherosclerosis of native arteries of extremities with intermittent claudication, bilateral legs: Secondary | ICD-10-CM

## 2024-11-09 NOTE — Progress Notes (Signed)
 MRN : [id number]  Brendan [name] is a 73 y.o. (1951-05-27) male who presents with chief complaint of check circulation.  History of Present Illness:   The patient returns to the office for followup and review status post angiogram with intervention on 10/20/2024.   Procedure:   Percutaneous transluminal angioplasty and stent placement right superficial femoral and popliteal arteries to 6 mm 2.  Percutaneous transluminal angioplasty and stent placement right peroneal artery to 4 mm  Also status post angiogram with intervention on 10/13/2024.   Procedure:  Percutaneous transluminal angioplasty and stent placement left superficial femoral and popliteal arteries to 6 mm 2.    Percutaneous transluminal angioplasty and stent placement left anterior tibial in 2 locations  The patient notes improvement in the lower extremity symptoms. No interval shortening of the patient's claudication distance or rest pain symptoms. No new ulcers or wounds have occurred since the last visit.  There have been no significant changes to the patient's overall health care.  No documented history of amaurosis fugax or recent TIA symptoms. There are no recent neurological changes noted. No documented history of DVT, PE or superficial thrombophlebitis. The patient denies recent episodes of angina or shortness of breath.   ABI's Rt=1.26 and Lt=0.88  (previous ABI's Rt=0.73 and Lt=0.48)   Current Meds  Medication Sig   acetaminophen  (TYLENOL ) 500 MG tablet Take 1 tablet (500 mg total) by mouth every 6 (six) hours as needed for mild pain or headache.   albuterol  (VENTOLIN  HFA) 108 (90 Base) MCG/ACT inhaler Inhale 2 puffs into the lungs every 6 (six) hours as needed for wheezing or shortness of breath.   amLODipine  (NORVASC ) 5 MG tablet Take 1 tablet (5 mg total) by mouth daily.   aspirin  EC 81 MG tablet Take 1 tablet (81 mg total) by mouth daily.    atorvastatin  (LIPITOR) 20 MG tablet Take 1 tablet (20 mg total) by mouth daily.   azelastine  (ASTELIN ) 0.1 % nasal spray Place 2 sprays into both nostrils at bedtime.   chlorthalidone  (HYGROTON ) 25 MG tablet Take 1 tablet (25 mg total) by mouth daily.   clopidogrel (PLAVIX) 75 MG tablet Take 1 tablet (75 mg total) by mouth daily.   cyanocobalamin  1000 MCG tablet Take 1,000 mcg by mouth every other day.   dapagliflozin  propanediol (FARXIGA ) 10 MG TABS tablet Take 1 tablet (10 mg total) by mouth daily before breakfast.   gabapentin  (NEURONTIN ) 300 MG capsule Take one capsule at lunchtime and two capsules at bedtime   lidocaine  (LIDODERM ) 5 % Place 1 patch onto the skin daily. Remove & Discard patch within 12 hours or as directed by MD   loratadine  (CLARITIN ) 10 MG tablet Take 1 tablet (10 mg total) by mouth daily.   metFORMIN  (GLUCOPHAGE ) 1000 MG tablet Take 1 tablet (1,000 mg total) by mouth 2 (two) times daily with a meal.   metoprolol  tartrate (LOPRESSOR ) 25 MG tablet Take 0.5 tablets (12.5 mg total) by mouth 2 (two) times daily.   montelukast  (SINGULAIR ) 10 MG tablet Take 1 tablet (10 mg total) by mouth at bedtime.   nitroGLYCERIN  (NITROSTAT )  0.4 MG SL tablet Place 1 tablet (0.4 mg total) under the tongue every 5 (five) minutes as needed for chest pain.   potassium chloride  SA (KLOR-CON  M) 20 MEQ tablet Take 2 tablets (40 meq) by mouth once daily   sildenafil  (REVATIO ) 20 MG tablet TAKE 1 TO 5 TABLETS AS NEEDED.   sitaGLIPtin  (JANUVIA ) 100 MG tablet Take 1 tablet (100 mg total) by mouth daily.   tamsulosin  (FLOMAX ) 0.4 MG CAPS capsule Take 1 capsule (0.4 mg total) by mouth daily.   tiZANidine  (ZANAFLEX ) 4 MG tablet Take 1 tablet (4 mg total) by mouth every 6 (six) hours as needed for muscle spasms.    Past Medical History:  Diagnosis Date   CAD (coronary artery disease)    a. 05/2016 Lexi MV: EF 58%, no ischemia/infarct; b. 05/2017 Cath: LM 20-30, LAD 50p, 62m, D1 50p, D2 80ost, LCX  90/70ost, RCA 80/90p/m, RPL1 50p, EF 50-55%; c. 06/2017 CABG x 4 (LIMA->LAD, VG->D2,VG->OM, VG->PDA).   Diabetes mellitus without complication (HCC)    Diastolic dysfunction    a. 05/2016 Echo: EF 55%, Gr1 DD, mild TR/MR.   Hyperlipidemia    Hypertension    Hypokalemia    Peripheral arterial disease    Personal history of tobacco use, presenting hazards to health 05/03/2016   Post-op Afib    a. 06/2017 following CABG->short course of amio.    Past Surgical History:  Procedure Laterality Date   CARDIAC CATHETERIZATION     COLONOSCOPY     COLONOSCOPY N/A 04/21/2024   Procedure: COLONOSCOPY;  Surgeon: Jinny Carmine, MD;  Location: [facility] ENDOSCOPY;  Service: Endoscopy;  Laterality: N/A;   CORONARY ARTERY BYPASS GRAFT N/A 07/01/2017   Procedure: CORONARY ARTERY BYPASS GRAFTING (CABG) x4, using left mammary artery and right greater saphenous vein harvested endoscopically;  Surgeon: Fleeta Hanford Coy, MD;  Location: [facility] OR;  Service: Open Heart Surgery;  Laterality: N/A;   KNEE SURGERY     LEFT HEART CATH AND CORONARY ANGIOGRAPHY N/A 06/04/2017   Procedure: Left Heart Cath and Coronary Angiography;  Surgeon: Mady Bruckner, MD;  Location: [facility] INVASIVE CV LAB;  Service: Cardiovascular;  Laterality: N/A;   LOWER EXTREMITY ANGIOGRAPHY Left 10/13/2024   Procedure: Lower Extremity Angiography;  Surgeon: Jama Cordella MATSU, MD;  Location: [facility] INVASIVE CV LAB;  Service: Cardiovascular;  Laterality: Left;   LOWER EXTREMITY ANGIOGRAPHY Right 10/20/2024   Procedure: Lower Extremity Angiography;  Surgeon: Jama Cordella MATSU, MD;  Location: [facility] INVASIVE CV LAB;  Service: Cardiovascular;  Laterality: Right;   TEE WITHOUT CARDIOVERSION N/A 07/01/2017   Procedure: TRANSESOPHAGEAL ECHOCARDIOGRAM (TEE);  Surgeon: Fleeta Hanford, Coy, MD;  Location: [facility] OR;  Service: Open Heart Surgery;  Laterality: N/A;    Social History Social History   Tobacco Use   Smoking status: Former    Current packs/day: 0.00     Average packs/day: 1 pack/day for 60.0 years (60.0 ttl pk-yrs)    Types: Cigarettes    Start date: 06/29/1957    Quit date: 06/29/2017    Years since quitting: 7.3   Smokeless tobacco: Never  Vaping Use   Vaping status: Never Used  Substance Use Topics   Alcohol use: No    Comment: previously drank heavily but quit ~ 21 yrs ago.   Drug use: No    Family History Family History  Problem Relation Age of Onset   Hypertension Mother    Diabetes Mother    Heart disease Father 74       multiple MI's  Allergies  Allergen Reactions   Lisinopril  Other (See Comments)    Fatigue and ED     REVIEW OF SYSTEMS (Negative unless checked)  Constitutional: [] Weight loss  [] Fever  [] Chills Cardiac: [] Chest pain   [] Chest pressure   [] Palpitations   [] Shortness of breath when laying flat   [] Shortness of breath with exertion. Vascular:  [x] Pain in legs with walking   [] Pain in legs at rest  [] History of DVT   [] Phlebitis   [] Swelling in legs   [] Varicose veins   [] Non-healing ulcers Pulmonary:   [] Uses home oxygen    [] Productive cough   [] Hemoptysis   [] Wheeze  [] COPD   [] Asthma Neurologic:  [] Dizziness   [] Seizures   [] History of stroke   [] History of TIA  [] Aphasia   [] Vissual changes   [] Weakness or numbness in arm   [] Weakness or numbness in leg Musculoskeletal:   [] Joint swelling   [] Joint pain   [] Low back pain Hematologic:  [] Easy bruising  [] Easy bleeding   [] Hypercoagulable state   [] Anemic Gastrointestinal:  [] Diarrhea   [] Vomiting  [] Gastroesophageal reflux/heartburn   [] Difficulty swallowing. Genitourinary:  [] Chronic kidney disease   [] Difficult urination  [] Frequent urination   [] Blood in urine Skin:  [] Rashes   [] Ulcers  Psychological:  [] History of anxiety   []  History of major depression.  Physical Examination  Vitals:   11/09/24 1533  BP: 122/76  Pulse: 72  Resp: 18  Weight: 200 lb (90.7 kg)  Height: 6' 3 (1.905 m)   Body mass index is 25 kg/m. Gen: WD/WN,  NAD Head: /AT, No temporalis wasting.  Ear/Nose/Throat: Hearing grossly intact, nares w/o erythema or drainage Eyes: PER, EOMI, sclera nonicteric.  Neck: Supple, no masses.  No bruit or JVD.  Pulmonary:  Good air movement, no audible wheezing, no use of accessory muscles.  Cardiac: RRR, normal S1, S2, no Murmurs. Vascular:  mild trophic changes, no open wounds Vessel Right Left  Radial Palpable Palpable  PT Not Palpable Not Palpable  DP Not Palpable Not Palpable  Gastrointestinal: soft, non-distended. No guarding/no peritoneal signs.  Musculoskeletal: M/S 5/5 throughout.  No visible deformity.  Neurologic: CN 2-12 intact. Pain and light touch intact in extremities.  Symmetrical.  Speech is fluent. Motor exam as listed above. Psychiatric: Judgment intact, Mood & affect appropriate for pt's clinical situation. Dermatologic: No rashes or ulcers noted.  No changes consistent with cellulitis.   CBC Lab Results  Component Value Date   WBC 8.3 01/14/2024   HGB 16.6 01/14/2024   HCT 49.3 01/14/2024   MCV 91 01/14/2024   PLT 180 01/14/2024    BMET    Component Value Date/Time   NA 146 (H) 10/16/2024 1330   K 3.6 10/16/2024 1330   CL 104 10/16/2024 1330   CO2 24 10/16/2024 1330   GLUCOSE 115 (H) 10/16/2024 1330   GLUCOSE 191 (H) 05/28/2023 1633   BUN 26 (H) 10/20/2024 0659   BUN 25 10/16/2024 1330   CREATININE 1.54 (H) 10/20/2024 0659   CALCIUM  9.8 10/16/2024 1330   GFRNONAA 47 (L) 10/20/2024 0659   GFRAA 71 08/23/2020 1458   Estimated Creatinine Clearance: 51.1 mL/min (A) (by C-G formula based on SCr of 1.54 mg/dL (H)).  COAG Lab Results  Component Value Date   INR 1.10 06/18/2017   INR 1.0 05/30/2017    Radiology PERIPHERAL VASCULAR CATHETERIZATION Result Date: 10/20/2024 See surgical note for result.  PERIPHERAL VASCULAR CATHETERIZATION Result Date: 10/13/2024 See surgical note for result.    Assessment/Plan  There are no diagnoses linked to this  encounter.   Cordella Shawl, MD  11/09/2024 3:48 PM

## 2024-11-11 LAB — VAS US ABI WITH/WO TBI
Left ABI: 0.88
Right ABI: 1.26

## 2024-11-18 ENCOUNTER — Encounter (INDEPENDENT_AMBULATORY_CARE_PROVIDER_SITE_OTHER): Payer: Self-pay | Admitting: Vascular Surgery

## 2024-11-18 DIAGNOSIS — I70219 Atherosclerosis of native arteries of extremities with intermittent claudication, unspecified extremity: Secondary | ICD-10-CM | POA: Insufficient documentation

## 2024-11-25 ENCOUNTER — Telehealth: Payer: Self-pay | Admitting: Nurse Practitioner

## 2024-11-25 NOTE — Telephone Encounter (Signed)
 No documentation was added that the patient was called.

## 2024-12-01 ENCOUNTER — Telehealth: Payer: Self-pay | Admitting: Nurse Practitioner

## 2024-12-01 NOTE — Telephone Encounter (Signed)
 Copied from CRM #[id number]. Topic: Medicare AWV >> Dec 01, 2024  9:14 AM Nathanel DEL wrote: Called 12/01/2024 to r/s AWV/change to phone visit 12/01/24. No voicemail. Khc  Nathanel Paschal; Care Guide Ambulatory Clinical Support  l [facility] Direct Dial: [phone]

## 2024-12-03 ENCOUNTER — Ambulatory Visit

## 2024-12-03 VITALS — Ht 75.0 in | Wt 200.0 lb

## 2024-12-03 DIAGNOSIS — Z0000 Encounter for general adult medical examination without abnormal findings: Secondary | ICD-10-CM

## 2024-12-03 NOTE — Progress Notes (Signed)
 Chief Complaint  Patient presents with   Medicare Wellness     Subjective:   Brendan [name] is a 73 y.o. male who presents for a Medicare Annual Wellness Visit.  Visit info / Clinical Intake: Medicare Wellness Visit Type:: Subsequent Annual Wellness Visit Persons participating in visit and providing information:: patient Medicare Wellness Visit Mode:: Telephone If telephone:: video declined Since this visit was completed virtually, some vitals may be partially provided or unavailable. Missing vitals are due to the limitations of the virtual format.: Documented vitals are patient reported If Telephone or Video please confirm:: I connected with patient using audio/video enable telemedicine. I verified patient identity with two identifiers, discussed telehealth limitations, and patient agreed to proceed. Patient Location:: home Provider Location:: home Interpreter Needed?: No Pre-visit prep was completed: yes AWV questionnaire completed by patient prior to visit?: no Living arrangements:: (!) lives alone Patient's Overall Health Status Rating: very good Typical amount of pain: none Does pain affect daily life?: no Are you currently prescribed opioids?: no  Dietary Habits and Nutritional Risks How many meals a day?: 3 Eats fruit and vegetables daily?: yes Most meals are obtained by: preparing own meals; eating out (50/50) In the last 2 weeks, have you had any of the following?: none Diabetic:: (!) yes Any non-healing wounds?: no How often do you check your BS?: 1 (daily or every other day) Would you like to be referred to a Nutritionist or for Diabetic Management? : no  Functional Status Activities of Daily Living (to include ambulation/medication): Independent Ambulation: Independent with device- listed below Home Assistive Devices/Equipment: Eyeglasses Medication Administration: Independent Home Management (perform basic housework or laundry): Independent Manage your own  finances?: yes Primary transportation is: family / friends (also rides a scooter to work) Concerns about vision?: no *vision screening is required for WTM* Concerns about hearing?: no  Fall Screening Falls in the past year?: 0 Number of falls in past year: 0 Was there an injury with Fall?: 0 Fall Risk Category Calculator: 0 Patient Fall Risk Level: Low Fall Risk  Fall Risk Patient at Risk for Falls Due to: No Fall Risks Fall risk Follow up: Falls evaluation completed  Home and Transportation Safety: All rugs have non-skid backing?: N/A, no rugs All stairs or steps have railings?: yes Grab bars in the bathtub or shower?: yes Have non-skid surface in bathtub or shower?: (!) no Good home lighting?: yes Regular seat belt use?: yes Hospital stays in the last year:: no  Cognitive Assessment Difficulty concentrating, remembering, or making decisions? : no Will 6CIT or Mini Cog be Completed: yes What year is it?: 0 points What month is it?: 0 points Give patient an address phrase to remember (5 components): [address] [place] About what time is it?: 0 points Count backwards from 20 to 1: 0 points Say the months of the year in reverse: 0 points Repeat the address phrase from earlier: 2 points 6 CIT Score: 2 points  Advance Directives (For Healthcare) Does Patient Have a Medical Advance Directive?: No Would patient like information on creating a medical advance directive?: No - Patient declined  Reviewed/Updated  Reviewed/Updated: Reviewed All (Medical, Surgical, Family, Medications, Allergies, Care Teams, Patient Goals)    Allergies (verified) Lisinopril    Current Medications (verified) Outpatient Encounter Medications as of 12/03/2024  Medication Sig   acetaminophen  (TYLENOL ) 500 MG tablet Take 1 tablet (500 mg total) by mouth every 6 (six) hours as needed for mild pain or headache.   albuterol  (VENTOLIN  HFA)  108 (90 Base) MCG/ACT inhaler Inhale 2 puffs into the lungs  every 6 (six) hours as needed for wheezing or shortness of breath.   amLODipine  (NORVASC ) 5 MG tablet Take 1 tablet (5 mg total) by mouth daily.   aspirin  EC 81 MG tablet Take 1 tablet (81 mg total) by mouth daily.   atorvastatin  (LIPITOR) 20 MG tablet Take 1 tablet (20 mg total) by mouth daily.   azelastine  (ASTELIN ) 0.1 % nasal spray Place 2 sprays into both nostrils at bedtime.   chlorthalidone  (HYGROTON ) 25 MG tablet Take 1 tablet (25 mg total) by mouth daily.   clopidogrel  (PLAVIX ) 75 MG tablet Take 1 tablet (75 mg total) by mouth daily.   cyanocobalamin  1000 MCG tablet Take 1,000 mcg by mouth every other day.   dapagliflozin  propanediol (FARXIGA ) 10 MG TABS tablet Take 1 tablet (10 mg total) by mouth daily before breakfast.   gabapentin  (NEURONTIN ) 300 MG capsule Take one capsule at lunchtime and two capsules at bedtime   lidocaine  (LIDODERM ) 5 % Place 1 patch onto the skin daily. Remove & Discard patch within 12 hours or as directed by MD (Patient taking differently: Place 1 patch onto the skin daily. Remove & Discard patch within 12 hours or as directed by MD Using PRN)   loratadine  (CLARITIN ) 10 MG tablet Take 1 tablet (10 mg total) by mouth daily.   metFORMIN  (GLUCOPHAGE ) 1000 MG tablet Take 1 tablet (1,000 mg total) by mouth 2 (two) times daily with a meal.   metoprolol  tartrate (LOPRESSOR ) 25 MG tablet Take 0.5 tablets (12.5 mg total) by mouth 2 (two) times daily.   montelukast  (SINGULAIR ) 10 MG tablet Take 1 tablet (10 mg total) by mouth at bedtime.   nitroGLYCERIN  (NITROSTAT ) 0.4 MG SL tablet Place 1 tablet (0.4 mg total) under the tongue every 5 (five) minutes as needed for chest pain.   potassium chloride  SA (KLOR-CON  M) 20 MEQ tablet Take 2 tablets (40 meq) by mouth once daily   sildenafil  (REVATIO ) 20 MG tablet TAKE 1 TO 5 TABLETS AS NEEDED.   sitaGLIPtin  (JANUVIA ) 100 MG tablet Take 1 tablet (100 mg total) by mouth daily.   tamsulosin  (FLOMAX ) 0.4 MG CAPS capsule Take 1 capsule  (0.4 mg total) by mouth daily.   tiZANidine  (ZANAFLEX ) 4 MG tablet Take 1 tablet (4 mg total) by mouth every 6 (six) hours as needed for muscle spasms.   No facility-administered encounter medications on file as of 12/03/2024.    History: Past Medical History:  Diagnosis Date   CAD (coronary artery disease)    a. 05/2016 Lexi MV: EF 58%, no ischemia/infarct; b. 05/2017 Cath: LM 20-30, LAD 50p, 82m, D1 50p, D2 80ost, LCX 90/70ost, RCA 80/90p/m, RPL1 50p, EF 50-55%; c. 06/2017 CABG x 4 (LIMA->LAD, VG->D2,VG->OM, VG->PDA).   Diabetes mellitus without complication (HCC)    Diastolic dysfunction    a. 05/2016 Echo: EF 55%, Gr1 DD, mild TR/MR.   Hyperlipidemia    Hypertension    Hypokalemia    Peripheral arterial disease    Personal history of tobacco use, presenting hazards to health 05/03/2016   Post-op Afib    a. 06/2017 following CABG->short course of amio.   Past Surgical History:  Procedure Laterality Date   CARDIAC CATHETERIZATION     COLONOSCOPY     COLONOSCOPY N/A 04/21/2024   Procedure: COLONOSCOPY;  Surgeon: Jinny Carmine, MD;  Location: [facility] ENDOSCOPY;  Service: Endoscopy;  Laterality: N/A;   CORONARY ARTERY BYPASS GRAFT N/A 07/01/2017  Procedure: CORONARY ARTERY BYPASS GRAFTING (CABG) x4, using left mammary artery and right greater saphenous vein harvested endoscopically;  Surgeon: Fleeta Hanford Coy, MD;  Location: [facility] OR;  Service: Open Heart Surgery;  Laterality: N/A;   KNEE SURGERY     LEFT HEART CATH AND CORONARY ANGIOGRAPHY N/A 06/04/2017   Procedure: Left Heart Cath and Coronary Angiography;  Surgeon: Mady Bruckner, MD;  Location: [facility] INVASIVE CV LAB;  Service: Cardiovascular;  Laterality: N/A;   LOWER EXTREMITY ANGIOGRAPHY Left 10/13/2024   Procedure: Lower Extremity Angiography;  Surgeon: Jama Cordella MATSU, MD;  Location: [facility] INVASIVE CV LAB;  Service: Cardiovascular;  Laterality: Left;   LOWER EXTREMITY ANGIOGRAPHY Right 10/20/2024   Procedure: Lower Extremity  Angiography;  Surgeon: Jama Cordella MATSU, MD;  Location: [facility] INVASIVE CV LAB;  Service: Cardiovascular;  Laterality: Right;   TEE WITHOUT CARDIOVERSION N/A 07/01/2017   Procedure: TRANSESOPHAGEAL ECHOCARDIOGRAM (TEE);  Surgeon: Fleeta Hanford, Coy, MD;  Location: [facility] OR;  Service: Open Heart Surgery;  Laterality: N/A;   Family History  Problem Relation Age of Onset   Hypertension Mother    Diabetes Mother    Heart disease Father 68       multiple MI's   Social History   Occupational History   Occupation: works part time as copy   Tobacco Use   Smoking status: Former    Current packs/day: 0.00    Average packs/day: 1 pack/day for 60.0 years (60.0 ttl pk-yrs)    Types: Cigarettes    Start date: 06/29/1957    Quit date: 06/29/2017    Years since quitting: 7.4   Smokeless tobacco: Never  Vaping Use   Vaping status: Never Used  Substance and Sexual Activity   Alcohol use: No    Comment: previously drank heavily but quit ~ 21 yrs ago.   Drug use: No   Sexual activity: Yes   Tobacco Counseling Counseling given: Not Answered  SDOH Screenings   Food Insecurity: No Food Insecurity (12/03/2024)  Housing: Low Risk  (12/03/2024)  Transportation Needs: No Transportation Needs (12/03/2024)  Utilities: Not At Risk (12/03/2024)  Alcohol Screen: Low Risk  (11/26/2023)  Depression (PHQ2-9): Low Risk  (12/03/2024)  Financial Resource Strain: Low Risk  (11/26/2023)  Physical Activity: Inactive (12/03/2024)  Social Connections: Socially Isolated (12/03/2024)  Stress: No Stress Concern Present (12/03/2024)  Tobacco Use: Medium Risk (12/03/2024)  Health Literacy: Adequate Health Literacy (12/03/2024)   See flowsheets for full screening details  Depression Screen PHQ 2 & 9 Depression Scale- Over the past 2 weeks, how often have you been bothered by any of the following problems? Little interest or pleasure in doing things: 0 Feeling down, depressed, or hopeless (PHQ Adolescent also  includes...irritable): 0 PHQ-2 Total Score: 0 Trouble falling or staying asleep, or sleeping too much: 0 Feeling tired or having little energy: 2 Poor appetite or overeating (PHQ Adolescent also includes...weight loss): 0 Feeling bad about yourself - or that you are a failure or have let yourself or your family down: 0 Trouble concentrating on things, such as reading the newspaper or watching television (PHQ Adolescent also includes...like school work): 0 Moving or speaking so slowly that other people could have noticed. Or the opposite - being so fidgety or restless that you have been moving around a lot more than usual: 0 Thoughts that you would be better off dead, or of hurting yourself in some way: 0 PHQ-9 Total Score: 2 If you checked off any problems, how difficult have these problems made it  for you to do your work, take care of things at home, or get along with other people?: Not difficult at all  Depression Treatment Depression Interventions/Treatment : [id number] Score <4 Follow-up Not Indicated     Goals Addressed               This Visit's Progress     Maintain current health (pt-stated)               Objective:    Today's Vitals   12/03/24 0959  Weight: 200 lb (90.7 kg)  Height: 6' 3 (1.905 m)   Body mass index is 25 kg/m.  Hearing/Vision screen Hearing Screening - Comments:: Denies hearing loss  Vision Screening - Comments:: UTD w/ Dr. Robynn Mevelyn Molly [place] Immunizations and Health Maintenance Health Maintenance  Topic Date Due   COVID-19 Vaccine (6 - 2025-26 season) 08/31/2024   Zoster Vaccines- Shingrix  (1 of 2) 01/16/2025 (Originally 05/19/1970)   OPHTHALMOLOGY EXAM  01/12/2025   Diabetic kidney evaluation - Urine ACR  01/13/2025   FOOT EXAM  01/28/2025   HEMOGLOBIN A1C  04/16/2025   Lung Cancer Screening  08/10/2025   Diabetic kidney evaluation - eGFR measurement  10/20/2025   Medicare Annual Wellness (AWV)  12/03/2025   DTaP/Tdap/Td (3 - Tdap)  03/30/2032   Colonoscopy  04/21/2034   Pneumococcal Vaccine: 50+ Years  Completed   Influenza Vaccine  Completed   Hepatitis C Screening  Completed   Meningococcal B Vaccine  Aged Out        Assessment/Plan:  This is a routine wellness examination for Abdifatah.  Patient Care Team: [name], [name], NP as PCP - General (Nurse Practitioner) End, Lonni, MD as Consulting Physician (Cardiology) Verta Royden DASEN, DPM as Consulting Physician (Podiatry) Schnier, Cordella MATSU, MD (Vascular Surgery) Jinny Carmine, MD as Consulting Physician (Gastroenterology) Mevelyn JONETTA Robynn, OD [facility])  I have personally reviewed and noted the following in the patient's chart:   Medical and social history Use of alcohol, tobacco or illicit drugs  Current medications and supplements including opioid prescriptions. Functional ability and status Nutritional status Physical activity Advanced directives List of other physicians Hospitalizations, surgeries, and ER visits in previous 12 months Vitals Screenings to include cognitive, depression, and falls Referrals and appointments  No orders of the defined types were placed in this encounter.  In addition, I have reviewed and discussed with patient certain preventive protocols, quality metrics, and best practice recommendations. A written personalized care plan for preventive services as well as general preventive health recommendations were provided to patient.   Vina Ned, CMA   12/03/2024   Return in 1 year (on 12/07/2025).  After Visit Summary: (Mail) Due to this being a telephonic visit, the after visit summary with patients personalized plan was offered to patient via mail   Nurse Notes:  6 CIT Score - 2 Needs DM foot exam at OV on 01/19/25 Declined Shingrix  and Covid vaccines Declined DM & Nutrition Education referral Screening colonoscopy no longer recommended by GI due to age.  Has DM eye exam scheduled for 01/13/25

## 2024-12-03 NOTE — Patient Instructions (Signed)
 Mr. Freel,  Thank you for taking the time for your Medicare Wellness Visit. I appreciate your continued commitment to your health goals. Please review the care plan we discussed, and feel free to reach out if I can assist you further.  Please note that Annual Wellness Visits do not include a physical exam. Some assessments may be limited, especially if the visit was conducted virtually. If needed, we may recommend an in-person follow-up with your provider.  Ongoing Care Seeing your primary care provider every 3 to 6 months helps us  monitor your health and provide consistent, personalized care.   Referrals If a referral was made during today's visit and you haven't received any updates within two weeks, please contact the referred provider directly to check on the status.  Recommended Screenings:  Keep up the good work!!  Health Maintenance  Topic Date Due   COVID-19 Vaccine (6 - 2025-26 season) 08/31/2024   Medicare Annual Wellness Visit  11/25/2024   Zoster (Shingles) Vaccine (1 of 2) 01/16/2025*   Eye exam for diabetics  01/12/2025   Yearly kidney health urinalysis for diabetes  01/13/2025   Complete foot exam   01/28/2025   Hemoglobin A1C  04/16/2025   Screening for Lung Cancer  08/10/2025   Yearly kidney function blood test for diabetes  10/20/2025   DTaP/Tdap/Td vaccine (3 - Tdap) 03/30/2032   Colon Cancer Screening  04/21/2034   Pneumococcal Vaccine for age over 29  Completed   Flu Shot  Completed   Hepatitis C Screening  Completed   Meningitis B Vaccine  Aged Out  *Topic was postponed. The date shown is not the original due date.       12/03/2024   10:04 AM  Advanced Directives  Does Patient Have a Medical Advance Directive? No  Would patient like information on creating a medical advance directive? No - Patient declined    Vision: Annual vision screenings are recommended for early detection of glaucoma, cataracts, and diabetic retinopathy. These exams can also reveal  signs of chronic conditions such as diabetes and high blood pressure.  Dental: Annual dental screenings help detect early signs of oral cancer, gum disease, and other conditions linked to overall health, including heart disease and diabetes.  Please see the attached documents for additional preventive care recommendations.

## 2024-12-28 ENCOUNTER — Ambulatory Visit: Admitting: Podiatry

## 2024-12-28 DIAGNOSIS — B351 Tinea unguium: Secondary | ICD-10-CM

## 2024-12-28 DIAGNOSIS — E1142 Type 2 diabetes mellitus with diabetic polyneuropathy: Secondary | ICD-10-CM | POA: Diagnosis not present

## 2024-12-28 DIAGNOSIS — M79676 Pain in unspecified toe(s): Secondary | ICD-10-CM | POA: Diagnosis not present

## 2024-12-28 NOTE — Progress Notes (Signed)
 He presents today chief complaint of painful elongated toenails 1 through 5 bilaterally.  He states that he saw vascular and he was revascularized and stented.  He states that he is doing much better his legs are getting stronger every day and his feet feel much better.  Objective: Vital signs stable oriented x 3 toenails are long thick yellow dystrophic onychomycotic feet are warm to the touch palpable pulses dorsalis pedis.  No open lesions or wounds.  Assessment: Pain in limb secondary to onychomycosis.  Revascularization.  Plan: Pain in limb secondary to onychomycosis.  Blood flow restored to bilateral lower extremity.

## 2025-01-13 LAB — OPHTHALMOLOGY REPORT-SCANNED

## 2025-01-16 NOTE — Patient Instructions (Signed)
 Be Involved in Caring For Your Health:  Taking Medications When medications are taken as directed, they can greatly improve your health. But if they are not taken as prescribed, they may not work. In some cases, not taking them correctly can be harmful. To help ensure your treatment remains effective and safe, understand your medications and how to take them. Bring your medications to each visit for review by your provider.  Your lab results, notes, and after visit summary will be available on My Chart. We strongly encourage you to use this feature. If lab results are abnormal the clinic will contact you with the appropriate steps. If the clinic does not contact you assume the results are satisfactory. You can always view your results on My Chart. If you have questions regarding your health or results, please contact the clinic during office hours. You can also ask questions on My Chart.  We at [facility] are grateful that you chose [place] to provide your care. We strive to provide evidence-based and compassionate care and are always looking for feedback. If you get a survey from the clinic please complete this so we can hear your opinions.  Diabetes Mellitus and Foot Care Diabetes, also called diabetes mellitus, may cause problems with your feet and legs because of poor blood flow (circulation). Poor circulation may make your skin: Become thinner and drier. Break more easily. Heal more slowly. Peel and crack. You may also have nerve damage (neuropathy). This can cause decreased feeling in your legs and feet. This means that you may not notice minor injuries to your feet that could lead to more serious problems. Finding and treating problems early is the best way to prevent future foot problems. How to care for your feet Foot hygiene  Wash your feet daily with warm water and mild soap. Do not use hot water. Then, pat your feet and the areas between your toes until they are fully dry. Do  not soak your feet. This can dry your skin. Trim your toenails straight across. Do not dig under them or around the cuticle. File the edges of your nails with an emery board or nail file. Apply a moisturizing lotion or petroleum jelly to the skin on your feet and to dry, brittle toenails. Use lotion that does not contain alcohol and is unscented. Do not apply lotion between your toes. Shoes and socks Wear clean socks or stockings every day. Make sure they are not too tight. Do not wear knee-high stockings. These may decrease blood flow to your legs. Wear shoes that fit well and have enough cushioning. Always look in your shoes before you put them on to be sure there are no objects inside. To break in new shoes, wear them for just a few hours a day. This prevents injuries on your feet. Wounds, scrapes, corns, and calluses  Check your feet daily for blisters, cuts, bruises, sores, and redness. If you cannot see the bottom of your feet, use a mirror or ask someone for help. Do not cut off corns or calluses or try to remove them with medicine. If you find a minor scrape, cut, or break in the skin on your feet, keep it and the skin around it clean and dry. You may clean these areas with mild soap and water. Do not clean the area with peroxide, alcohol, or iodine. If you have a wound, scrape, corn, or callus on your foot, look at it several times a day to make sure it  is healing and not infected. Check for: Redness, swelling, or pain. Fluid or blood. Warmth. Pus or a bad smell. General tips Do not cross your legs. This may decrease blood flow to your feet. Do not use heating pads or hot water bottles on your feet. They may burn your skin. If you have lost feeling in your feet or legs, you may not know this is happening until it is too late. Protect your feet from hot and cold by wearing shoes, such as at the beach or on hot pavement. Schedule a complete foot exam at least once a year or more often if  you have foot problems. Report any cuts, sores, or bruises to your health care provider right away. Where to find more information American Diabetes Association: diabetes.org Association of Diabetes Care & Education Specialists: diabeteseducator.org Contact a health care provider if: You have a condition that increases your risk of infection, and you have any cuts, sores, or bruises on your feet. You have an injury that is not healing. You have redness on your legs or feet. You feel burning or tingling in your legs or feet. You have pain or cramps in your legs and feet. Your legs or feet are numb. Your feet always feel cold. You have pain around any toenails. Get help right away if: You have a wound, scrape, corn, or callus on your foot and: You have signs of infection. You have a fever. You have a red line going up your leg. This information is not intended to replace advice given to you by your health care provider. Make sure you discuss any questions you have with your health care provider. Document Revised: 06/20/2022 Document Reviewed: 06/20/2022 Elsevier Patient Education  2024 [employer].

## 2025-01-19 ENCOUNTER — Encounter: Payer: Self-pay | Admitting: Nurse Practitioner

## 2025-01-19 ENCOUNTER — Ambulatory Visit (INDEPENDENT_AMBULATORY_CARE_PROVIDER_SITE_OTHER): Admitting: Nurse Practitioner

## 2025-01-19 VITALS — BP 110/72 | HR 69 | Temp 97.5°F | Ht 75.0 in | Wt 199.7 lb

## 2025-01-19 DIAGNOSIS — N400 Enlarged prostate without lower urinary tract symptoms: Secondary | ICD-10-CM

## 2025-01-19 DIAGNOSIS — I152 Hypertension secondary to endocrine disorders: Secondary | ICD-10-CM | POA: Diagnosis not present

## 2025-01-19 DIAGNOSIS — E1159 Type 2 diabetes mellitus with other circulatory complications: Secondary | ICD-10-CM | POA: Diagnosis not present

## 2025-01-19 DIAGNOSIS — E1169 Type 2 diabetes mellitus with other specified complication: Secondary | ICD-10-CM

## 2025-01-19 DIAGNOSIS — Z7984 Long term (current) use of oral hypoglycemic drugs: Secondary | ICD-10-CM | POA: Diagnosis not present

## 2025-01-19 DIAGNOSIS — E1140 Type 2 diabetes mellitus with diabetic neuropathy, unspecified: Secondary | ICD-10-CM | POA: Diagnosis not present

## 2025-01-19 DIAGNOSIS — I739 Peripheral vascular disease, unspecified: Secondary | ICD-10-CM | POA: Diagnosis not present

## 2025-01-19 DIAGNOSIS — I70213 Atherosclerosis of native arteries of extremities with intermittent claudication, bilateral legs: Secondary | ICD-10-CM

## 2025-01-19 DIAGNOSIS — I288 Other diseases of pulmonary vessels: Secondary | ICD-10-CM | POA: Diagnosis not present

## 2025-01-19 DIAGNOSIS — I5032 Chronic diastolic (congestive) heart failure: Secondary | ICD-10-CM | POA: Diagnosis not present

## 2025-01-19 DIAGNOSIS — J432 Centrilobular emphysema: Secondary | ICD-10-CM | POA: Diagnosis not present

## 2025-01-19 DIAGNOSIS — E119 Type 2 diabetes mellitus without complications: Secondary | ICD-10-CM

## 2025-01-19 DIAGNOSIS — E538 Deficiency of other specified B group vitamins: Secondary | ICD-10-CM

## 2025-01-19 LAB — MICROALBUMIN, URINE WAIVED
Creatinine, Urine Waived: 200 mg/dL (ref 10–300)
Microalb, Ur Waived: 10 mg/L (ref 0–19)
Microalb/Creat Ratio: <30 mg/g

## 2025-01-19 LAB — BAYER DCA HB A1C WAIVED: HB A1C (BAYER DCA - WAIVED): 6.2 % — ABNORMAL HIGH (ref 4.8–5.6)

## 2025-01-19 NOTE — Assessment & Plan Note (Signed)
 Noted on CT lung screening. He is agreeable to pulmonary referral next visit for further assessment of possible pulmonary hypertension, refuses this visit.

## 2025-01-19 NOTE — Assessment & Plan Note (Signed)
Chronic, stable.  Continue current medication regimen, Flomax, and adjust as needed. 

## 2025-01-19 NOTE — Assessment & Plan Note (Signed)
 Chronic, ongoing.  A1c 6.2% today, trend down from 6.5%. Urine ALB 09 January 2025. Continue Metformin , Farxiga , and Januvia .  Adjust Metformin  or Januvia  based on kidney function as needed.  Recommend heavy focus on diet with less pasta and potatoes. Continue Gabapentin  as prescribed by podiatry and adjust as needed based on renal function.  Check BS BID and document for next visit.   - Eye and foot exams up to date - Vaccinations up to date - Statin on board, allergic to ACE

## 2025-01-19 NOTE — Assessment & Plan Note (Signed)
 Chronic, stable.  April 2024 - FEV1 54% and FEV1/FVC 59%, change from in 2022 was FEV1 64% and FEV1/FVC 73%.  Continue Albuterol  and recommend he use this if wheezing present or SOB.  Continue cessation of smoking and continue yearly lung screening.  Spirometry annually, when available. Have recommended a daily inhaler for maintenance and to help lung function, he refuses. Continue annual lung screening.  He is agreeable to pulmonary referral next visit for further assessment of possible pulmonary hypertension, but refuses this visit.

## 2025-01-19 NOTE — Assessment & Plan Note (Signed)
 Chronic and stable at present after stent placements - 10/13/24 and 10/20/24. Continue current medication regimen and collaboration with vascular. Recent notes reviewed.

## 2025-01-19 NOTE — Assessment & Plan Note (Signed)
 Refer to diabetes with neuropathy plan of care

## 2025-01-19 NOTE — Assessment & Plan Note (Signed)
Chronic, stable.  Euvolemic on exam today.  Continue collaboration with cardiology and current medication regimen as ordered by them, recent notes and testing reviewed.  On Farxiga for prevention.  Recommend: - Reminded to call for an overnight weight gain of >2 pounds or a weekly weight weight of >5 pounds - not adding salt to his food and has been reading food labels. Reviewed the importance of keeping daily sodium intake to 2000mg  daily  - Avoid Ibuprofen medications

## 2025-01-19 NOTE — Assessment & Plan Note (Signed)
 Chronic, stable.  BP well below goal in office and at home.  Continue current medication regimen and adjust as needed.  Continue collaboration with cardiology, recent notes reviewed.  Recommend he monitor BP at least a few times a week at home and document.  DASH diet focus.  LABS: CBC, TSH, CMP.  Urine ALB 09 January 2025.

## 2025-01-19 NOTE — Assessment & Plan Note (Signed)
Ongoing, continue supplement.  Recheck level today.

## 2025-01-19 NOTE — Assessment & Plan Note (Signed)
Chronic, ongoing.  Continue current medication regimen and adjust as needed.  Lipid panel today.  Recent LDL <70.

## 2025-01-19 NOTE — Assessment & Plan Note (Signed)
 Chronic, stable.  Continue statin and ASA daily at home for prevention + collaboration with podiatry and vascular. Recent notes reviewed.

## 2025-01-19 NOTE — Progress Notes (Signed)
 "  BP 110/72 (BP Location: Left Arm, Patient Position: Sitting, Cuff Size: Normal)   Pulse 69   Temp (!) 97.5 F (36.4 C) (Oral)   Ht 6' 3 (1.905 m)   Wt 199 lb 11.2 oz (90.6 kg)   SpO2 92%   BMI 24.96 kg/m    Subjective:    Patient ID: Brendan [name], male    DOB: 1951-01-26, 74 y.o.   MRN: [id number]  HPI: Brendan [name] is a 74 y.o. male   Chief Complaint  Patient presents with   COPD   Diabetes    [facility] exam requested from [facility]   Hyperlipidemia   Hypertension   DIABETES October A1c 6.5%. Continues Metformin  1000 MG BID, Januvia  100 MG daily, and Farxiga  10 MG daily + Gabapentin  300 MG in morning and 600 MG at bedtime and B12 for neuropathy.   Saw vascular on 11/09/24 for lower extremity pain and had stents on 10/20/24 (right leg), had initial surgery on 10/13/24 (left leg). Podiatry last seen 12/28/24. Hypoglycemic episodes:no Polydipsia/polyuria: no Visual disturbance: no Chest pain: no Paresthesias: no Glucose Monitoring: yes  Accucheck frequency: this morning was 85, but has been a little high during recent holidays  Fasting glucose:  Post prandial:   Evening:  Before meals: Taking Insulin ?: no  Long acting insulin :  Short acting insulin : Blood Pressure Monitoring: a few times a week Retinal Examination: Up to Date Foot Exam: Up to Date Diabetic Education: Completed Pneumovax: Up to Date Influenza: Up to Date Aspirin : yes   HYPERTENSION / HYPERLIPIDEMIA Continues Metoprolol , Hygroton , Amlodipine , and Lipitor. Has never used NTG.  Allergic to Lisinopril .    Cardiology last seen on 09/30/24 with no changes made. Stress test and Myo 06/21/23 -- EF 66%. Continues Flomax  for BPH. Satisfied with current treatment? yes Duration of hypertension: chronic BP monitoring frequency: every few days BP range: <130/80 on average BP medication side effects: no Duration of hyperlipidemia: chronic Cholesterol medication side effects: no Cholesterol supplements:  none Medication compliance: good compliance Aspirin : yes Recent stressors: no Recurrent headaches: no Visual changes: no Palpitations: no Dyspnea: no Chest pain: no Lower extremity edema: at baseline a little bit Dizzy/lightheaded: no   COPD Takes Singulair  and Albuterol . Had lung screening last on 08/10/24 with aortic atherosclerosis, emphysema, left adrenal adenoma, gallstones,and enlarged pulmonic truck. Adrenal adenoma chronically present on imaging, measures 2.2 cm and suspected to be lipid poor adenoma.    Has not smoked in > 9 years since bypass surgery. COPD status: stable Satisfied with current treatment?: yes Oxygen  use: no Limitation of activity: no Pneumovax: Up to Date Influenza: Up to Date      01/19/2025    1:49 PM 12/03/2024   10:13 AM 10/16/2024    1:23 PM 07/15/2024    2:40 PM 04/14/2024    2:02 PM  Depression screen PHQ 2/9  Decreased Interest 0 0 0 0 0  Down, Depressed, Hopeless 0 0 0 0 0  PHQ - 2 Score 0 0 0 0 0  Altered sleeping 0 0 0 0 0  Tired, decreased energy 0 2 0 1 0  Change in appetite 0 0 0 0 0  Feeling bad or failure about yourself  0 0 0 0 0  Trouble concentrating 0 0 0 0 0  Moving slowly or fidgety/restless 0 0 0 0 0  Suicidal thoughts 0 0 0 0 0  PHQ-9 Score 0 2 0  1  0   Difficult doing work/chores Not difficult at  all Not difficult at all Not difficult at all Not difficult at all Not difficult at all     Data saved with a previous flowsheet row definition       01/19/2025    1:49 PM 07/15/2024    2:41 PM 04/14/2024    2:02 PM 01/14/2024    2:28 PM  GAD 7 : Generalized Anxiety Score  Nervous, Anxious, on Edge 0 0  0  0   Control/stop worrying 0 0  0  0   Worry too much - different things 0 1  0  0   Trouble relaxing 0 0  0  0   Restless 0 0  0  0   Easily annoyed or irritable 0 0  0  0   Afraid - awful might happen 0 0  0  0   Total GAD 7 Score 0 1 0 0  Anxiety Difficulty Not difficult at all Not difficult at all Not difficult at  all Not difficult at all     Data saved with a previous flowsheet row definition   Relevant past medical, surgical, family and social history reviewed and updated as indicated. Interim medical history since our last visit reviewed. Allergies and medications reviewed and updated.  Review of Systems  Constitutional:  Negative for activity change, diaphoresis, fatigue and fever.  Respiratory:  Negative for cough, chest tightness, shortness of breath and wheezing.   Cardiovascular:  Negative for chest pain, palpitations and leg swelling.  Gastrointestinal: Negative.   Endocrine: Negative for cold intolerance, heat intolerance, polydipsia, polyphagia and polyuria.  Neurological: Negative.   Psychiatric/Behavioral: Negative.     Per HPI unless specifically indicated above     Objective:    BP 110/72 (BP Location: Left Arm, Patient Position: Sitting, Cuff Size: Normal)   Pulse 69   Temp (!) 97.5 F (36.4 C) (Oral)   Ht 6' 3 (1.905 m)   Wt 199 lb 11.2 oz (90.6 kg)   SpO2 92%   BMI 24.96 kg/m   Wt Readings from Last 3 Encounters:  01/19/25 199 lb 11.2 oz (90.6 kg)  12/03/24 200 lb (90.7 kg)  11/09/24 200 lb (90.7 kg)    Physical Exam Vitals and nursing note reviewed.  Constitutional:      General: He is awake. He is not in acute distress.    Appearance: Normal appearance. He is well-developed and well-groomed. He is not ill-appearing or toxic-appearing.  HENT:     Head: Normocephalic.     Right Ear: Hearing and external ear normal.     Left Ear: Hearing and external ear normal.  Eyes:     General: Lids are normal.     Extraocular Movements: Extraocular movements intact.     Conjunctiva/sclera: Conjunctivae normal.  Neck:     Thyroid : No thyromegaly.     Vascular: No carotid bruit.  Cardiovascular:     Rate and Rhythm: Normal rate and regular rhythm.     Heart sounds: Normal heart sounds. No murmur heard.    No gallop.  Pulmonary:     Effort: No accessory muscle usage  or respiratory distress.     Breath sounds: Normal breath sounds. No decreased breath sounds, wheezing or rales.  Abdominal:     General: Bowel sounds are normal. There is no distension.     Palpations: Abdomen is soft.     Tenderness: There is no abdominal tenderness.  Musculoskeletal:     Cervical back: Full passive range of motion  without pain.     Right lower leg: No edema.     Left lower leg: No edema.  Lymphadenopathy:     Cervical: No cervical adenopathy.  Skin:    General: Skin is warm.     Capillary Refill: Capillary refill takes less than 2 seconds.  Neurological:     Mental Status: He is alert and oriented to person, place, and time.     Deep Tendon Reflexes: Reflexes are normal and symmetric.     Reflex Scores:      Brachioradialis reflexes are 2+ on the right side and 2+ on the left side.      Patellar reflexes are 2+ on the right side and 2+ on the left side. Psychiatric:        Attention and Perception: Attention normal.        Mood and Affect: Mood normal.        Speech: Speech normal.        Behavior: Behavior normal. Behavior is cooperative.        Thought Content: Thought content normal.    Results for orders placed or performed in visit on 11/09/24  VAS US  ABI WITH/WO TBI   Collection Time: 11/09/24  3:05 PM  Result Value Ref Range   Right ABI 1.26    Left ABI .88       Assessment & Plan:   Problem List Items Addressed This Visit       Cardiovascular and Mediastinum   Chronic heart failure with preserved ejection fraction (HFpEF) (HCC) (Chronic)   Chronic, stable.  Euvolemic on exam today.  Continue collaboration with cardiology and current medication regimen as ordered by them, recent notes and testing reviewed.  On Farxiga  for prevention.  Recommend: - Reminded to call for an overnight weight gain of >2 pounds or a weekly weight weight of >5 pounds - not adding salt to his food and has been reading food labels. Reviewed the importance of keeping  daily sodium intake to 2000mg  daily  - Avoid Ibuprofen medications      PAD (peripheral artery disease)   Chronic, stable.  Continue statin and ASA daily at home for prevention + collaboration with podiatry and vascular. Recent notes reviewed.      Hypertension associated with diabetes (HCC)   Chronic, stable.  BP well below goal in office and at home.  Continue current medication regimen and adjust as needed.  Continue collaboration with cardiology, recent notes reviewed.  Recommend he monitor BP at least a few times a week at home and document.  DASH diet focus.  LABS: CBC, TSH, CMP.  Urine ALB 09 January 2025.      Relevant Orders   Bayer DCA Hb A1c Waived   Microalbumin, Urine Waived   Comprehensive metabolic panel with GFR   TSH   Enlarged pulmonary artery (HCC)   Noted on CT lung screening. He is agreeable to pulmonary referral next visit for further assessment of possible pulmonary hypertension, refuses this visit.      Atherosclerosis of native arteries of extremity with intermittent claudication   Chronic and stable at present after stent placements - 10/13/24 and 10/20/24. Continue current medication regimen and collaboration with vascular. Recent notes reviewed.        Respiratory   Centrilobular emphysema (HCC) (Chronic)   Chronic, stable.  April 2024 - FEV1 54% and FEV1/FVC 59%, change from in 2022 was FEV1 64% and FEV1/FVC 73%.  Continue Albuterol  and recommend he use this if  wheezing present or SOB.  Continue cessation of smoking and continue yearly lung screening.  Spirometry annually, when available. Have recommended a daily inhaler for maintenance and to help lung function, he refuses. Continue annual lung screening.  He is agreeable to pulmonary referral next visit for further assessment of possible pulmonary hypertension, but refuses this visit.        Endocrine   Hyperlipidemia associated with type 2 diabetes mellitus (HCC) (Chronic)   Chronic, ongoing.   Continue current medication regimen and adjust as needed.  Lipid panel today.  Recent LDL <70.      Relevant Orders   Bayer DCA Hb A1c Waived   Comprehensive metabolic panel with GFR   Lipid Panel w/o Chol/HDL Ratio   Controlled type 2 diabetes with neuropathy (HCC) - Primary (Chronic)   Chronic, ongoing.  A1c 6.2% today, trend down from 6.5%. Urine ALB 09 January 2025. Continue Metformin , Farxiga , and Januvia .  Adjust Metformin  or Januvia  based on kidney function as needed.  Recommend heavy focus on diet with less pasta and potatoes. Continue Gabapentin  as prescribed by podiatry and adjust as needed based on renal function.  Check BS BID and document for next visit.   - Eye and foot exams up to date - Vaccinations up to date - Statin on board, allergic to ACE      Relevant Orders   Bayer DCA Hb A1c Waived   Microalbumin, Urine Waived   Diabetes mellitus treated with oral medication (HCC)   Refer to diabetes with neuropathy plan of care.      Relevant Orders   Bayer DCA Hb A1c Waived   Microalbumin, Urine Waived     Genitourinary   BPH (benign prostatic hyperplasia)   Chronic, stable.  Continue current medication regimen, Flomax , and adjust as needed.      Relevant Orders   PSA     Other   B12 deficiency (Chronic)   Ongoing, continue supplement.  Recheck level today.      Relevant Orders   CBC with Differential/Platelet   Vitamin B12     Follow up plan: Return in about 3 months (around 04/19/2025) for T2DM, HTN/HLD, COPD.      "

## 2025-01-20 ENCOUNTER — Ambulatory Visit: Payer: Self-pay | Admitting: Nurse Practitioner

## 2025-01-20 LAB — LIPID PANEL W/O CHOL/HDL RATIO
Cholesterol, Total: 110 mg/dL (ref 100–199)
HDL: 27 mg/dL — ABNORMAL LOW
LDL Chol Calc (NIH): 59 mg/dL (ref 0–99)
Triglycerides: 136 mg/dL (ref 0–149)
VLDL Cholesterol Cal: 24 mg/dL (ref 5–40)

## 2025-01-20 LAB — CBC WITH DIFFERENTIAL/PLATELET
Basophils Absolute: 0 x10E3/uL (ref 0.0–0.2)
Basos: 1 %
EOS (ABSOLUTE): 0.2 x10E3/uL (ref 0.0–0.4)
Eos: 3 %
Hematocrit: 50.9 % (ref 37.5–51.0)
Hemoglobin: 17.1 g/dL (ref 13.0–17.7)
Immature Grans (Abs): 0 x10E3/uL (ref 0.0–0.1)
Immature Granulocytes: 0 %
Lymphocytes Absolute: 1.8 x10E3/uL (ref 0.7–3.1)
Lymphs: 24 %
MCH: 30.2 pg (ref 26.6–33.0)
MCHC: 33.6 g/dL (ref 31.5–35.7)
MCV: 90 fL (ref 79–97)
Monocytes Absolute: 0.8 x10E3/uL (ref 0.1–0.9)
Monocytes: 11 %
Neutrophils Absolute: 4.5 x10E3/uL (ref 1.4–7.0)
Neutrophils: 61 %
Platelets: 211 x10E3/uL (ref 150–450)
RBC: 5.66 x10E6/uL (ref 4.14–5.80)
RDW: 14.3 % (ref 11.6–15.4)
WBC: 7.3 x10E3/uL (ref 3.4–10.8)

## 2025-01-20 LAB — COMPREHENSIVE METABOLIC PANEL WITH GFR
ALT: 19 IU/L (ref 0–44)
AST: 21 IU/L (ref 0–40)
Albumin: 4.8 g/dL (ref 3.8–4.8)
Alkaline Phosphatase: 76 IU/L (ref 47–123)
BUN/Creatinine Ratio: 13 (ref 10–24)
BUN: 23 mg/dL (ref 8–27)
Bilirubin Total: 0.4 mg/dL (ref 0.0–1.2)
CO2: 20 mmol/L (ref 20–29)
Calcium: 9.7 mg/dL (ref 8.6–10.2)
Chloride: 102 mmol/L (ref 96–106)
Creatinine, Ser: 1.76 mg/dL — ABNORMAL HIGH (ref 0.76–1.27)
Globulin, Total: 1.7 g/dL (ref 1.5–4.5)
Glucose: 80 mg/dL (ref 70–99)
Potassium: 4.3 mmol/L (ref 3.5–5.2)
Sodium: 142 mmol/L (ref 134–144)
Total Protein: 6.5 g/dL (ref 6.0–8.5)
eGFR: 40 mL/min/1.73 — ABNORMAL LOW

## 2025-01-20 LAB — PSA: Prostate Specific Ag, Serum: 0.5 ng/mL (ref 0.0–4.0)

## 2025-01-20 LAB — VITAMIN B12: Vitamin B-12: 411 pg/mL (ref 232–1245)

## 2025-01-20 LAB — TSH: TSH: 1.28 u[IU]/mL (ref 0.450–4.500)

## 2025-01-20 NOTE — Progress Notes (Signed)
 Good afternoon, please let Brendan [name] know his labs have returned and overall remain stable with exception of kidney function showing ongoing Stage 3b kidney disease with no worsening. We will continue to monitor this. Any questions? Keep being amazing!!  Thank you for allowing me to participate in your care.  I appreciate you. Kindest regards, Jakobi Thetford

## 2025-01-21 ENCOUNTER — Ambulatory Visit: Payer: Self-pay

## 2025-01-21 NOTE — Telephone Encounter (Signed)
 Pt called with additional questions about labs. PT was concerned about Kidney disease. Shared provider's note.  Re-read to him the part about NO worsening of kidney diease and will continue to monitor.  No further questions.   Melanie ONEIDA Polio, NP 01/20/2025  4:35 PM EST     Good afternoon, please let Abir know his labs have returned and overall remain stable with exception of kidney function showing ongoing Stage 3b kidney disease with no worsening. We will continue to monitor this. Any questions? Keep being amazing!!  Thank you for allowing me to participate in your care.  I appreciate you. Kindest regards, Jolene          Copied from CRM [id number]. Topic: Clinical - Lab/Test Results >> Jan 21, 2025  5:25 PM Hadassah PARAS wrote: Reason for CRM: Pt has addtl questions regarding lab results. Relayed note from Jolene but has addtl questions. Transferring to NT

## 2025-02-08 ENCOUNTER — Encounter (INDEPENDENT_AMBULATORY_CARE_PROVIDER_SITE_OTHER)

## 2025-02-08 ENCOUNTER — Ambulatory Visit (INDEPENDENT_AMBULATORY_CARE_PROVIDER_SITE_OTHER): Admitting: Vascular Surgery

## 2025-03-31 ENCOUNTER — Ambulatory Visit: Admitting: Podiatry

## 2025-04-21 ENCOUNTER — Ambulatory Visit: Admitting: Nurse Practitioner

## 2025-12-07 ENCOUNTER — Ambulatory Visit
